# Patient Record
Sex: Male | Born: 1935 | Race: White | Hispanic: No | Marital: Married | Smoking: Never smoker
Health system: Southern US, Community
[De-identification: ages and names within clinical notes are randomized; demographics above are authoritative.]

## PROBLEM LIST (undated history)

## (undated) DIAGNOSIS — R001 Bradycardia, unspecified: Secondary | ICD-10-CM

## (undated) DIAGNOSIS — E785 Hyperlipidemia, unspecified: Secondary | ICD-10-CM

## (undated) DIAGNOSIS — I219 Acute myocardial infarction, unspecified: Secondary | ICD-10-CM

## (undated) DIAGNOSIS — I255 Ischemic cardiomyopathy: Secondary | ICD-10-CM

## (undated) DIAGNOSIS — I2510 Atherosclerotic heart disease of native coronary artery without angina pectoris: Secondary | ICD-10-CM

## (undated) DIAGNOSIS — R42 Dizziness and giddiness: Secondary | ICD-10-CM

## (undated) DIAGNOSIS — D696 Thrombocytopenia, unspecified: Secondary | ICD-10-CM

## (undated) DIAGNOSIS — N400 Enlarged prostate without lower urinary tract symptoms: Secondary | ICD-10-CM

## (undated) DIAGNOSIS — M1990 Unspecified osteoarthritis, unspecified site: Secondary | ICD-10-CM

## (undated) DIAGNOSIS — I340 Nonrheumatic mitral (valve) insufficiency: Secondary | ICD-10-CM

## (undated) DIAGNOSIS — I351 Nonrheumatic aortic (valve) insufficiency: Secondary | ICD-10-CM

## (undated) DIAGNOSIS — C4490 Unspecified malignant neoplasm of skin, unspecified: Secondary | ICD-10-CM

## (undated) DIAGNOSIS — IMO0001 Reserved for inherently not codable concepts without codable children: Secondary | ICD-10-CM

## (undated) DIAGNOSIS — I10 Essential (primary) hypertension: Secondary | ICD-10-CM

## (undated) HISTORY — PX: MOHS SURGERY: SUR867

## (undated) HISTORY — DX: Bradycardia, unspecified: R00.1

## (undated) HISTORY — DX: Unspecified osteoarthritis, unspecified site: M19.90

## (undated) HISTORY — PX: COLONOSCOPY: SHX174

## (undated) HISTORY — DX: Dizziness and giddiness: R42

## (undated) HISTORY — PX: JOINT REPLACEMENT: SHX530

## (undated) HISTORY — DX: Hyperlipidemia, unspecified: E78.5

## (undated) HISTORY — DX: Atherosclerotic heart disease of native coronary artery without angina pectoris: I25.10

## (undated) HISTORY — DX: Essential (primary) hypertension: I10

## (undated) HISTORY — DX: Ischemic cardiomyopathy: I25.5

## (undated) HISTORY — PX: TRANSURETHRAL RESECTION OF PROSTATE: SHX73

## (undated) HISTORY — DX: Thrombocytopenia, unspecified: D69.6

## (undated) HISTORY — DX: Benign prostatic hyperplasia without lower urinary tract symptoms: N40.0

## (undated) HISTORY — DX: Nonrheumatic mitral (valve) insufficiency: I34.0

## (undated) HISTORY — DX: Nonrheumatic aortic (valve) insufficiency: I35.1

## (undated) HISTORY — PX: CORONARY ANGIOPLASTY: SHX604

---

## 2000-02-02 DIAGNOSIS — I219 Acute myocardial infarction, unspecified: Secondary | ICD-10-CM

## 2000-02-02 HISTORY — DX: Acute myocardial infarction, unspecified: I21.9

## 2000-05-17 ENCOUNTER — Ambulatory Visit (HOSPITAL_COMMUNITY): Admission: RE | Admit: 2000-05-17 | Discharge: 2000-05-17 | Payer: Self-pay | Admitting: Gastroenterology

## 2001-01-07 ENCOUNTER — Inpatient Hospital Stay (HOSPITAL_COMMUNITY): Admission: EM | Admit: 2001-01-07 | Discharge: 2001-01-19 | Payer: Self-pay | Admitting: Emergency Medicine

## 2001-01-07 ENCOUNTER — Encounter: Payer: Self-pay | Admitting: *Deleted

## 2001-01-08 ENCOUNTER — Encounter: Payer: Self-pay | Admitting: *Deleted

## 2001-01-09 ENCOUNTER — Encounter: Payer: Self-pay | Admitting: *Deleted

## 2001-01-14 ENCOUNTER — Encounter: Payer: Self-pay | Admitting: Cardiology

## 2001-02-14 ENCOUNTER — Encounter (HOSPITAL_COMMUNITY): Admission: RE | Admit: 2001-02-14 | Discharge: 2001-05-15 | Payer: Self-pay | Admitting: Internal Medicine

## 2002-05-04 ENCOUNTER — Encounter: Payer: Self-pay | Admitting: Orthopedic Surgery

## 2002-05-04 ENCOUNTER — Encounter: Admission: RE | Admit: 2002-05-04 | Discharge: 2002-05-04 | Payer: Self-pay | Admitting: Orthopedic Surgery

## 2004-01-16 ENCOUNTER — Ambulatory Visit: Payer: Self-pay | Admitting: *Deleted

## 2004-02-02 HISTORY — PX: HIP ARTHROPLASTY: SHX981

## 2004-02-04 ENCOUNTER — Inpatient Hospital Stay (HOSPITAL_COMMUNITY): Admission: RE | Admit: 2004-02-04 | Discharge: 2004-02-09 | Payer: Self-pay | Admitting: Orthopedic Surgery

## 2004-05-18 ENCOUNTER — Ambulatory Visit: Payer: Self-pay | Admitting: *Deleted

## 2004-05-20 ENCOUNTER — Ambulatory Visit: Payer: Self-pay | Admitting: *Deleted

## 2004-11-19 ENCOUNTER — Ambulatory Visit: Payer: Self-pay | Admitting: Cardiology

## 2004-11-27 ENCOUNTER — Ambulatory Visit: Payer: Self-pay | Admitting: Internal Medicine

## 2004-12-02 ENCOUNTER — Ambulatory Visit: Payer: Self-pay | Admitting: Internal Medicine

## 2004-12-04 ENCOUNTER — Ambulatory Visit: Payer: Self-pay | Admitting: Cardiology

## 2005-03-31 ENCOUNTER — Inpatient Hospital Stay (HOSPITAL_COMMUNITY): Admission: RE | Admit: 2005-03-31 | Discharge: 2005-04-02 | Payer: Self-pay | Admitting: Urology

## 2005-03-31 ENCOUNTER — Encounter (INDEPENDENT_AMBULATORY_CARE_PROVIDER_SITE_OTHER): Payer: Self-pay | Admitting: Specialist

## 2005-09-22 ENCOUNTER — Ambulatory Visit: Payer: Self-pay | Admitting: Cardiovascular Disease

## 2005-09-22 ENCOUNTER — Inpatient Hospital Stay (HOSPITAL_COMMUNITY): Admission: EM | Admit: 2005-09-22 | Discharge: 2005-09-23 | Payer: Self-pay | Admitting: Emergency Medicine

## 2005-09-30 ENCOUNTER — Encounter (HOSPITAL_COMMUNITY): Admission: RE | Admit: 2005-09-30 | Discharge: 2005-12-29 | Payer: Self-pay | Admitting: Cardiology

## 2005-10-14 ENCOUNTER — Ambulatory Visit: Payer: Self-pay | Admitting: Cardiology

## 2005-11-12 ENCOUNTER — Ambulatory Visit: Payer: Self-pay | Admitting: Cardiology

## 2005-12-08 ENCOUNTER — Ambulatory Visit: Payer: Self-pay | Admitting: Internal Medicine

## 2005-12-15 ENCOUNTER — Ambulatory Visit: Payer: Self-pay

## 2005-12-22 ENCOUNTER — Ambulatory Visit: Payer: Self-pay | Admitting: Cardiology

## 2005-12-24 ENCOUNTER — Ambulatory Visit: Payer: Self-pay | Admitting: Internal Medicine

## 2005-12-30 ENCOUNTER — Encounter (HOSPITAL_COMMUNITY): Admission: RE | Admit: 2005-12-30 | Discharge: 2006-01-07 | Payer: Self-pay | Admitting: Cardiology

## 2006-05-20 ENCOUNTER — Ambulatory Visit: Payer: Self-pay | Admitting: Cardiology

## 2006-11-11 ENCOUNTER — Ambulatory Visit: Payer: Self-pay | Admitting: Cardiology

## 2006-12-06 ENCOUNTER — Ambulatory Visit: Payer: Self-pay

## 2007-04-14 ENCOUNTER — Ambulatory Visit: Payer: Self-pay | Admitting: Internal Medicine

## 2007-04-14 DIAGNOSIS — I2510 Atherosclerotic heart disease of native coronary artery without angina pectoris: Secondary | ICD-10-CM | POA: Insufficient documentation

## 2007-04-14 DIAGNOSIS — E785 Hyperlipidemia, unspecified: Secondary | ICD-10-CM | POA: Insufficient documentation

## 2007-04-14 DIAGNOSIS — N400 Enlarged prostate without lower urinary tract symptoms: Secondary | ICD-10-CM | POA: Insufficient documentation

## 2007-04-14 DIAGNOSIS — I10 Essential (primary) hypertension: Secondary | ICD-10-CM | POA: Insufficient documentation

## 2007-04-21 LAB — CONVERTED CEMR LAB
BUN: 14 mg/dL (ref 6–23)
Bilirubin Urine: NEGATIVE
CO2: 32 meq/L (ref 19–32)
Calcium: 9.5 mg/dL (ref 8.4–10.5)
Chloride: 108 meq/L (ref 96–112)
Cholesterol: 135 mg/dL (ref 0–200)
Creatinine, Ser: 1.1 mg/dL (ref 0.4–1.5)
GFR calc Af Amer: 83 mL/min
GFR calc non Af Amer: 68 mL/min
Glucose, Bld: 119 mg/dL — ABNORMAL HIGH (ref 70–99)
HDL: 65.5 mg/dL (ref >39.0)
Ketones, ur: NEGATIVE mg/dL
LDL Cholesterol: 58 mg/dL (ref 0–99)
Leukocytes, UA: NEGATIVE
Nitrite: NEGATIVE
PSA: 1.85 ng/mL (ref 0.10–4.00)
Potassium: 4.8 meq/L (ref 3.5–5.1)
Sodium: 143 meq/L (ref 135–145)
Specific Gravity, Urine: 1.015 (ref 1.000–1.03)
TSH: 2.68 microintl units/mL (ref 0.35–5.50)
Total CHOL/HDL Ratio: 2.1
Total Protein, Urine: NEGATIVE mg/dL
Triglycerides: 60 mg/dL (ref 0–149)
Urine Glucose: NEGATIVE mg/dL
Urobilinogen, UA: 0.2 (ref 0.0–1.0)
VLDL: 12 mg/dL (ref 0–40)
pH: 6.5 (ref 5.0–8.0)

## 2007-05-26 ENCOUNTER — Ambulatory Visit: Payer: Self-pay | Admitting: Cardiology

## 2007-07-31 ENCOUNTER — Ambulatory Visit: Payer: Self-pay | Admitting: Internal Medicine

## 2007-11-16 ENCOUNTER — Ambulatory Visit: Payer: Self-pay | Admitting: Cardiovascular Disease

## 2007-11-27 ENCOUNTER — Ambulatory Visit: Payer: Self-pay | Admitting: Internal Medicine

## 2007-11-27 LAB — CONVERTED CEMR LAB
ALT: 19 units/L (ref 0–53)
AST: 27 units/L (ref 0–37)
Albumin: 3.7 g/dL (ref 3.5–5.2)
Alkaline Phosphatase: 50 units/L (ref 39–117)
Basophils Absolute: 0 10*3/uL (ref 0.0–0.1)
Basophils Relative: 0.5 % (ref 0.0–3.0)
Bilirubin, Direct: 0.2 mg/dL (ref 0.0–0.3)
Cholesterol: 126 mg/dL (ref 0–200)
Eosinophils Absolute: 0.3 10*3/uL (ref 0.0–0.7)
Eosinophils Relative: 3.8 % (ref 0.0–5.0)
HCT: 44.1 % (ref 39.0–52.0)
HDL: 61.7 mg/dL (ref >39.0)
Hemoglobin: 15 g/dL (ref 13.0–17.0)
LDL Cholesterol: 54 mg/dL (ref 0–99)
Lymphocytes Relative: 21.1 % (ref 12.0–46.0)
MCHC: 34.1 g/dL (ref 30.0–36.0)
MCV: 94.1 fL (ref 78.0–100.0)
Monocytes Absolute: 0.7 10*3/uL (ref 0.1–1.0)
Monocytes Relative: 11 % (ref 3.0–12.0)
Neutro Abs: 4.4 10*3/uL (ref 1.4–7.7)
Neutrophils Relative %: 63.6 % (ref 43.0–77.0)
Platelets: 175 10*3/uL (ref 150–400)
RBC: 4.68 M/uL (ref 4.22–5.81)
RDW: 13.3 % (ref 11.5–14.6)
Total Bilirubin: 1.7 mg/dL — ABNORMAL HIGH (ref 0.3–1.2)
Total CHOL/HDL Ratio: 2
Total Protein: 7.1 g/dL (ref 6.0–8.3)
Triglycerides: 54 mg/dL (ref 0–149)
VLDL: 11 mg/dL (ref 0–40)
WBC: 6.8 10*3/uL (ref 4.5–10.5)

## 2007-12-04 ENCOUNTER — Ambulatory Visit: Payer: Self-pay | Admitting: Internal Medicine

## 2007-12-13 ENCOUNTER — Encounter: Payer: Self-pay | Admitting: Cardiovascular Disease

## 2007-12-13 ENCOUNTER — Ambulatory Visit: Payer: Self-pay

## 2008-03-07 ENCOUNTER — Telehealth: Payer: Self-pay | Admitting: Internal Medicine

## 2008-03-11 ENCOUNTER — Ambulatory Visit: Payer: Self-pay | Admitting: Internal Medicine

## 2008-03-11 DIAGNOSIS — R42 Dizziness and giddiness: Secondary | ICD-10-CM | POA: Insufficient documentation

## 2008-04-29 DIAGNOSIS — I2510 Atherosclerotic heart disease of native coronary artery without angina pectoris: Secondary | ICD-10-CM

## 2008-04-29 DIAGNOSIS — I509 Heart failure, unspecified: Secondary | ICD-10-CM | POA: Insufficient documentation

## 2008-04-29 DIAGNOSIS — M1990 Unspecified osteoarthritis, unspecified site: Secondary | ICD-10-CM | POA: Insufficient documentation

## 2008-04-29 DIAGNOSIS — D696 Thrombocytopenia, unspecified: Secondary | ICD-10-CM | POA: Insufficient documentation

## 2008-04-29 DIAGNOSIS — I5042 Chronic combined systolic (congestive) and diastolic (congestive) heart failure: Secondary | ICD-10-CM | POA: Insufficient documentation

## 2008-05-06 ENCOUNTER — Ambulatory Visit: Payer: Self-pay | Admitting: Cardiovascular Disease

## 2008-05-06 ENCOUNTER — Encounter: Payer: Self-pay | Admitting: Cardiovascular Disease

## 2008-05-06 DIAGNOSIS — I6529 Occlusion and stenosis of unspecified carotid artery: Secondary | ICD-10-CM | POA: Insufficient documentation

## 2008-06-12 ENCOUNTER — Ambulatory Visit: Payer: Self-pay | Admitting: Internal Medicine

## 2008-06-18 ENCOUNTER — Ambulatory Visit: Payer: Self-pay | Admitting: Internal Medicine

## 2008-06-19 LAB — CONVERTED CEMR LAB
ALT: 19 units/L (ref 0–53)
AST: 28 units/L (ref 0–37)
Albumin: 3.7 g/dL (ref 3.5–5.2)
Alkaline Phosphatase: 55 units/L (ref 39–117)
BUN: 12 mg/dL (ref 6–23)
Basophils Absolute: 0 10*3/uL (ref 0.0–0.1)
Basophils Relative: 0.4 % (ref 0.0–3.0)
Bilirubin Urine: NEGATIVE
Bilirubin, Direct: 0.2 mg/dL (ref 0.0–0.3)
CO2: 30 meq/L (ref 19–32)
Calcium: 9.1 mg/dL (ref 8.4–10.5)
Chloride: 108 meq/L (ref 96–112)
Cholesterol: 125 mg/dL (ref 0–200)
Creatinine, Ser: 1 mg/dL (ref 0.4–1.5)
Eosinophils Absolute: 0.2 10*3/uL (ref 0.0–0.7)
Eosinophils Relative: 3.6 % (ref 0.0–5.0)
GFR calc non Af Amer: 75.88 mL/min (ref >60)
Glucose, Bld: 106 mg/dL — ABNORMAL HIGH (ref 70–99)
HCT: 42.8 % (ref 39.0–52.0)
HDL: 61.1 mg/dL (ref >39.00)
Hemoglobin: 14.7 g/dL (ref 13.0–17.0)
Ketones, ur: NEGATIVE mg/dL
LDL Cholesterol: 53 mg/dL (ref 0–99)
Leukocytes, UA: NEGATIVE
Lymphocytes Relative: 22.3 % (ref 12.0–46.0)
Lymphs Abs: 1.4 10*3/uL (ref 0.7–4.0)
MCHC: 34.3 g/dL (ref 30.0–36.0)
MCV: 94.1 fL (ref 78.0–100.0)
Monocytes Absolute: 0.7 10*3/uL (ref 0.1–1.0)
Monocytes Relative: 11.5 % (ref 3.0–12.0)
Neutro Abs: 4 10*3/uL (ref 1.4–7.7)
Neutrophils Relative %: 62.2 % (ref 43.0–77.0)
Nitrite: NEGATIVE
Platelets: 177 10*3/uL (ref 150.0–400.0)
Potassium: 4.8 meq/L (ref 3.5–5.1)
RBC: 4.55 M/uL (ref 4.22–5.81)
RDW: 13 % (ref 11.5–14.6)
Sodium: 142 meq/L (ref 135–145)
Specific Gravity, Urine: 1.01 (ref 1.000–1.030)
TSH: 3.36 microintl units/mL (ref 0.35–5.50)
Total Bilirubin: 1.2 mg/dL (ref 0.3–1.2)
Total CHOL/HDL Ratio: 2
Total Protein, Urine: NEGATIVE mg/dL
Total Protein: 7.1 g/dL (ref 6.0–8.3)
Triglycerides: 53 mg/dL (ref 0.0–149.0)
Urine Glucose: NEGATIVE mg/dL
Urobilinogen, UA: 0.2 (ref 0.0–1.0)
VLDL: 10.6 mg/dL (ref 0.0–40.0)
WBC: 6.3 10*3/uL (ref 4.5–10.5)
pH: 7 (ref 5.0–8.0)

## 2008-11-12 ENCOUNTER — Ambulatory Visit: Payer: Self-pay | Admitting: Cardiovascular Disease

## 2008-11-22 ENCOUNTER — Ambulatory Visit: Payer: Self-pay

## 2008-11-22 ENCOUNTER — Encounter: Payer: Self-pay | Admitting: Cardiovascular Disease

## 2008-11-25 ENCOUNTER — Telehealth (INDEPENDENT_AMBULATORY_CARE_PROVIDER_SITE_OTHER): Payer: Self-pay | Admitting: *Deleted

## 2008-12-11 ENCOUNTER — Ambulatory Visit: Payer: Self-pay | Admitting: Internal Medicine

## 2008-12-11 DIAGNOSIS — D485 Neoplasm of uncertain behavior of skin: Secondary | ICD-10-CM | POA: Insufficient documentation

## 2008-12-18 ENCOUNTER — Ambulatory Visit: Payer: Self-pay | Admitting: Internal Medicine

## 2009-01-02 ENCOUNTER — Telehealth: Payer: Self-pay | Admitting: Internal Medicine

## 2009-01-02 DIAGNOSIS — C434 Malignant melanoma of scalp and neck: Secondary | ICD-10-CM | POA: Insufficient documentation

## 2009-01-09 ENCOUNTER — Encounter: Payer: Self-pay | Admitting: Internal Medicine

## 2009-04-15 ENCOUNTER — Ambulatory Visit: Payer: Self-pay | Admitting: Internal Medicine

## 2009-04-15 LAB — CONVERTED CEMR LAB
ALT: 22 units/L (ref 0–53)
AST: 28 units/L (ref 0–37)
Albumin: 4.1 g/dL (ref 3.5–5.2)
Alkaline Phosphatase: 53 units/L (ref 39–117)
BUN: 16 mg/dL (ref 6–23)
Basophils Absolute: 0.1 10*3/uL (ref 0.0–0.1)
Basophils Relative: 0.9 % (ref 0.0–3.0)
Bilirubin Urine: NEGATIVE
Bilirubin, Direct: 0.3 mg/dL (ref 0.0–0.3)
CO2: 30 meq/L (ref 19–32)
Calcium: 9.4 mg/dL (ref 8.4–10.5)
Chloride: 105 meq/L (ref 96–112)
Cholesterol: 133 mg/dL (ref 0–200)
Creatinine, Ser: 1.1 mg/dL (ref 0.4–1.5)
Eosinophils Absolute: 0.2 10*3/uL (ref 0.0–0.7)
Eosinophils Relative: 3.5 % (ref 0.0–5.0)
GFR calc non Af Amer: 67.84 mL/min (ref >60)
Glucose, Bld: 94 mg/dL (ref 70–99)
HCT: 44.7 % (ref 39.0–52.0)
HDL: 74.6 mg/dL (ref >39.00)
Hemoglobin: 14.9 g/dL (ref 13.0–17.0)
Ketones, ur: NEGATIVE mg/dL
LDL Cholesterol: 47 mg/dL (ref 0–99)
Leukocytes, UA: NEGATIVE
Lymphocytes Relative: 27.9 % (ref 12.0–46.0)
Lymphs Abs: 2 10*3/uL (ref 0.7–4.0)
MCHC: 33.3 g/dL (ref 30.0–36.0)
MCV: 93 fL (ref 78.0–100.0)
Monocytes Absolute: 0.8 10*3/uL (ref 0.1–1.0)
Monocytes Relative: 12 % (ref 3.0–12.0)
Neutro Abs: 3.9 10*3/uL (ref 1.4–7.7)
Neutrophils Relative %: 55.7 % (ref 43.0–77.0)
Nitrite: NEGATIVE
PSA: 3.22 ng/mL (ref 0.10–4.00)
Platelets: 174 10*3/uL (ref 150.0–400.0)
Potassium: 4.6 meq/L (ref 3.5–5.1)
RBC: 4.8 M/uL (ref 4.22–5.81)
RDW: 13.7 % (ref 11.5–14.6)
Sodium: 139 meq/L (ref 135–145)
Specific Gravity, Urine: 1.015 (ref 1.000–1.030)
TSH: 4.6 microintl units/mL (ref 0.35–5.50)
Total Bilirubin: 1.5 mg/dL — ABNORMAL HIGH (ref 0.3–1.2)
Total CHOL/HDL Ratio: 2
Total Protein, Urine: NEGATIVE mg/dL
Total Protein: 7.5 g/dL (ref 6.0–8.3)
Triglycerides: 55 mg/dL (ref 0.0–149.0)
Urine Glucose: NEGATIVE mg/dL
Urobilinogen, UA: 0.2 (ref 0.0–1.0)
VLDL: 11 mg/dL (ref 0.0–40.0)
WBC: 7 10*3/uL (ref 4.5–10.5)
pH: 7 (ref 5.0–8.0)

## 2009-04-22 ENCOUNTER — Ambulatory Visit: Payer: Self-pay | Admitting: Internal Medicine

## 2009-04-22 DIAGNOSIS — R972 Elevated prostate specific antigen [PSA]: Secondary | ICD-10-CM | POA: Insufficient documentation

## 2009-05-12 ENCOUNTER — Ambulatory Visit: Payer: Self-pay | Admitting: Cardiovascular Disease

## 2009-05-12 DIAGNOSIS — N529 Male erectile dysfunction, unspecified: Secondary | ICD-10-CM | POA: Insufficient documentation

## 2009-08-29 ENCOUNTER — Ambulatory Visit: Payer: Self-pay | Admitting: Internal Medicine

## 2009-10-15 ENCOUNTER — Ambulatory Visit: Payer: Self-pay | Admitting: Internal Medicine

## 2009-10-16 LAB — CONVERTED CEMR LAB
ALT: 18 units/L (ref 0–53)
AST: 28 units/L (ref 0–37)
Albumin: 4 g/dL (ref 3.5–5.2)
Alkaline Phosphatase: 46 units/L (ref 39–117)
BUN: 20 mg/dL (ref 6–23)
Bilirubin, Direct: 0.3 mg/dL (ref 0.0–0.3)
CO2: 30 meq/L (ref 19–32)
Calcium: 9.4 mg/dL (ref 8.4–10.5)
Chloride: 106 meq/L (ref 96–112)
Cholesterol: 127 mg/dL (ref 0–200)
Creatinine, Ser: 1.2 mg/dL (ref 0.4–1.5)
GFR calc non Af Amer: 63.73 mL/min (ref >60)
Glucose, Bld: 105 mg/dL — ABNORMAL HIGH (ref 70–99)
HDL: 61 mg/dL (ref >39.00)
LDL Cholesterol: 57 mg/dL (ref 0–99)
PSA: 3.51 ng/mL (ref 0.10–4.00)
Potassium: 6 meq/L — ABNORMAL HIGH (ref 3.5–5.1)
Sodium: 143 meq/L (ref 135–145)
Total Bilirubin: 1.6 mg/dL — ABNORMAL HIGH (ref 0.3–1.2)
Total CHOL/HDL Ratio: 2
Total Protein: 6.8 g/dL (ref 6.0–8.3)
Triglycerides: 47 mg/dL (ref 0.0–149.0)
VLDL: 9.4 mg/dL (ref 0.0–40.0)

## 2009-10-21 ENCOUNTER — Ambulatory Visit: Payer: Self-pay | Admitting: Internal Medicine

## 2009-10-21 DIAGNOSIS — E875 Hyperkalemia: Secondary | ICD-10-CM | POA: Insufficient documentation

## 2009-10-21 DIAGNOSIS — L570 Actinic keratosis: Secondary | ICD-10-CM | POA: Insufficient documentation

## 2009-10-22 LAB — CONVERTED CEMR LAB
BUN: 14 mg/dL (ref 6–23)
CO2: 23 meq/L (ref 19–32)
Calcium: 9.2 mg/dL (ref 8.4–10.5)
Chloride: 106 meq/L (ref 96–112)
Creatinine, Ser: 1 mg/dL (ref 0.4–1.5)
GFR calc non Af Amer: 75.64 mL/min (ref >60)
Glucose, Bld: 135 mg/dL — ABNORMAL HIGH (ref 70–99)
Potassium: 4.9 meq/L (ref 3.5–5.1)
Sodium: 138 meq/L (ref 135–145)

## 2009-11-18 ENCOUNTER — Encounter: Payer: Self-pay | Admitting: Cardiovascular Disease

## 2009-11-19 ENCOUNTER — Ambulatory Visit: Payer: Self-pay

## 2009-11-19 ENCOUNTER — Ambulatory Visit: Payer: Self-pay | Admitting: Cardiovascular Disease

## 2010-01-09 ENCOUNTER — Telehealth: Payer: Self-pay | Admitting: Cardiovascular Disease

## 2010-01-23 ENCOUNTER — Telehealth: Payer: Self-pay | Admitting: Cardiovascular Disease

## 2010-02-17 ENCOUNTER — Other Ambulatory Visit: Payer: Self-pay | Admitting: Internal Medicine

## 2010-02-17 ENCOUNTER — Ambulatory Visit
Admission: RE | Admit: 2010-02-17 | Discharge: 2010-02-17 | Payer: Self-pay | Source: Home / Self Care | Attending: Internal Medicine | Admitting: Internal Medicine

## 2010-02-17 LAB — BASIC METABOLIC PANEL
BUN: 18 mg/dL (ref 6–23)
CO2: 29 mEq/L (ref 19–32)
Calcium: 9 mg/dL (ref 8.4–10.5)
Chloride: 101 mEq/L (ref 96–112)
Creatinine, Ser: 1.1 mg/dL (ref 0.4–1.5)
GFR: 66.31 mL/min (ref >60.00)
Glucose, Bld: 153 mg/dL — ABNORMAL HIGH (ref 70–99)
Potassium: 4.5 mEq/L (ref 3.5–5.1)
Sodium: 136 mEq/L (ref 135–145)

## 2010-02-23 ENCOUNTER — Ambulatory Visit
Admission: RE | Admit: 2010-02-23 | Discharge: 2010-02-23 | Payer: Self-pay | Source: Home / Self Care | Attending: Internal Medicine | Admitting: Internal Medicine

## 2010-02-23 DIAGNOSIS — R7309 Other abnormal glucose: Secondary | ICD-10-CM | POA: Insufficient documentation

## 2010-03-03 NOTE — Assessment & Plan Note (Signed)
Summary: 6 mo rov /nws  $50   Vital Signs:  Patient profile:   77 year old male Weight:      170 pounds Temp:     97.3 degrees F oral Pulse rate:   46 / minute BP sitting:   114 / 64  (left arm)  Vitals Entered By: Tora Perches (December 11, 2008 11:09 AM) CC: f/u Is Patient Diabetic? No   Primary Care Provider:  Tresa Garter MD  CC:  f/u.  History of Present Illness: The patient presents for a follow up of hypertension, CAD, hyperlipidemia    Preventive Screening-Counseling & Management  Alcohol-Tobacco     Smoking Status: never  Current Medications (verified): 1)  Lisinopril 20 Mg Tabs (Lisinopril) .[name].. 1 Tablet By Mouth Bid 2)  Plavix 75 Mg Tabs (Clopidogrel Bisulfate) .... Take 1 Tab By Mouth Daily 3)  Carvedilol 6.25 Mg  Tabs (Carvedilol) .[name].. 1 Tab By Mouth Two Times Daily 4)  Simvastatin 80 Mg Tabs (Simvastatin) .[name].. 1 By Mouth At Bedtime 5)  Aspirin 81 Mg  Tbec (Aspirin) .... One By Mouth Every Day 6)  Vitamin D3 1000 Unit  Tabs (Cholecalciferol) .[name].. 1 By Mouth Daily 7)  B Complex   Tabs (B Complex Vitamins) .[name].. 1 By Mouth Qd 8)  Meclizine Hcl 12.5 Mg Tabs (Meclizine Hcl) .[name].. 1-2 Tab Qid  As Needed 9)  Nitroglycerin 0.4 Mg Subl (Nitroglycerin) .... One Tablet Under Tongue Every 5 Minutes As Needed For Chest Pain---May Repeat Times Three 10)  Glucosamine Triple Strength .... Take 1 Tablet By Mouth Two Times A Day  Allergies: 1)  ! Pcn  Past History:  Past Medical History: Last updated: 06/12/2008 CORONARY ARTERY DISEASE (ICD-414.00) HYPERTENSION (ICD-401.9) HYPERLIPIDEMIA (ICD-272.4) BENIGN PROSTATIC HYPERTROPHY (ICD-600.00) DR Logan Bores THROMBOCYTOPENIA (ICD-287.5) DEGENERATIVE JOINT DISEASE (ICD-715.90) VERTIGO (ICD-780.4)  BPV 2010 Poss. [facility]  Family History: Last updated: 04/29/2008 Family History Hypertension Mother: died at 82 MI had known CAD Father: died MVA at 61 Siblings: CAD  Social History: Last updated: 04/29/2008  Retired Married Regular exercise-yes Tobacco Use - No.  Alcohol Use - no Drug Use - no  Review of Systems  The patient denies fever, chest pain, dyspnea on exertion, abdominal pain, melena, and severe indigestion/heartburn.    Physical Exam  General:  General: Well developed, well nourished, NAD HEENT: OP clear, mucus membranes moist SKIN: warm, dry Psychiatric: Mood and affect normal Neck: No JVD, no carotid bruits, no thyromegaly, no lymphadenopathy. Lungs:Clear bilaterally, no wheezes, rhonci, crackles CV: RRR no murmurs, gallops rubs Abdomen: soft, NT, ND, BS present Extremities: No edema, pulses 1+ bilateral PT/DP.  Nose:  External nasal examination shows no deformity or inflammation. Nasal mucosa are pink and moist without lesions or exudates. Mouth:  Oral mucosa and oropharynx without lesions or exudates.  Teeth in good repair. Neck:  No deformities, masses, or tenderness noted. Lungs:  Normal respiratory effort, chest expands symmetrically. Lungs are clear to auscultation, no crackles or wheezes. Heart:  RRR Abdomen:  Bowel sounds positive,abdomen soft and non-tender without masses, organomegaly or hernias noted. Msk:  No deformity or scoliosis noted of thoracic or lumbar spine.   Neurologic:  No cranial nerve deficits noted. Station and gait are normal. Plantar reflexes are down-going bilaterally. DTRs are symmetrical throughout. Sensory, motor and coordinative functions appear intact. Skin:  Aging changes Psych:  Cognition and judgment appear intact. Alert and cooperative with normal attention span and concentration. No apparent delusions, illusions, hallucinations   Impression & Recommendations:  Problem # 1:  CORONARY ARTERY DISEASE (ICD-414.00) Assessment Unchanged  His updated medication list for this problem includes:    Lisinopril 20 Mg Tabs (Lisinopril) .[name]... 1 tablet by mouth bid    Plavix 75 Mg Tabs (Clopidogrel bisulfate) .[name]... Take 1 tab by mouth daily     Carvedilol 6.25 Mg Tabs (Carvedilol) .[name]... 1 tab by mouth two times daily    Aspirin 81 Mg Tbec (Aspirin) ..... One by mouth every day    Nitroglycerin 0.4 Mg Subl (Nitroglycerin) ..... One tablet under tongue every 5 minutes as needed for chest pain---may repeat times three  Problem # 2:  HYPERTENSION (ICD-401.9) Assessment: Unchanged  His updated medication list for this problem includes:    Lisinopril 20 Mg Tabs (Lisinopril) .[name]... 1 tablet by mouth bid    Carvedilol 6.25 Mg Tabs (Carvedilol) .[name]... 1 tab by mouth two times daily  Problem # 3:  HYPERLIPIDEMIA (ICD-272.4) Assessment: Unchanged  His updated medication list for this problem includes:    Simvastatin 80 Mg Tabs (Simvastatin) .[name]... 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 1 tablet by mouth bid 2)  Plavix 75 Mg Tabs (Clopidogrel bisulfate) .... Take 1 tab by mouth daily 3)  Carvedilol 6.25 Mg Tabs (Carvedilol) .[name].. 1 tab by mouth two times daily 4)  Simvastatin 80 Mg Tabs (Simvastatin) .[name].. 1 by mouth at bedtime 5)  Aspirin 81 Mg Tbec (Aspirin) .... One by mouth every day 6)  Vitamin D3 1000 Unit Tabs (Cholecalciferol) .[name].. 1 by mouth daily 7)  B Complex Tabs (B complex vitamins) .[name].. 1 by mouth qd 8)  Meclizine Hcl 12.5 Mg Tabs (Meclizine hcl) .[name].. 1-2 tab qid  as needed 9)  Nitroglycerin 0.4 Mg Subl (Nitroglycerin) .... One tablet under tongue every 5 minutes as needed for chest pain---may repeat times three 10)  Glucosamine Triple Strength  .... Take 1 tablet by mouth two times a day  Patient Instructions: 1)  Skin bx w/me 2)  Please schedule a follow-up appointment in 4 months. 3)  BMP prior to visit, ICD-9: 4)  Hepatic Panel prior to visit, ICD-9:596.0 414.8  5)  Lipid Panel prior to visit, ICD-9: 6)  TSH prior to visit, ICD-9: 7)  CBC w/ Diff prior to visit, ICD-9: 8)  Urine-dip prior to visit, ICD-9: 9)  PSA prior to visit, ICD-9:

## 2010-03-03 NOTE — Assessment & Plan Note (Signed)
Summary: 6 month rov/sl    Visit Type:  6 mo f/u Primary Provider:  Tresa Garter MD  CC:  no cardiac complaints today.  History of Present Illness: 77 yo WM with h/o CAD s/p posterior inferior MI 2002 with placement of a bare metal stent in the circumflex artery and subsequent unstable angina August 2007 with placement of drug eluting stent in the mid LAD , also history of HTN, hyperlipidemia who returns today for routine cardiac folow up. He has been doing well. He continues to exercise on a daily basis. He has had no chest pain, shortness of breath, dizziness, palpitations, near syncope, syncope, orthopnea or lower ext edema. He has been taking all of his medications as prescribed.   He has been under alot of stress with his wife who is now confined to a wheelchair. She is disabled by back pain post back surgery.   Most recent lipids in September 2011 with TC=127, HDL 61, LDL 57.   LFTs ok.   Current Medications (verified): 1)  Lisinopril 20 Mg Tabs (Lisinopril) .... 1/2  Tablet By Mouth Two Times A Day 2)  Plavix 75 Mg Tabs (Clopidogrel Bisulfate) .... Take 1 Tab By Mouth Daily 3)  Carvedilol 6.25 Mg  Tabs (Carvedilol) .[name].. 1 Tab By Mouth Two Times Daily 4)  Simvastatin 80 Mg Tabs (Simvastatin) .[name].. 1 By Mouth At Bedtime 5)  Aspirin 81 Mg  Tbec (Aspirin) .... One By Mouth Every Day 6)  Vitamin D3 1000 Unit  Tabs (Cholecalciferol) .[name].. 1 By Mouth Daily 7)  B Complex   Tabs (B Complex Vitamins) .[name].. 1 By Mouth Qd 8)  Nitroglycerin 0.4 Mg Subl (Nitroglycerin) .... One Tablet Under Tongue Every 5 Minutes As Needed For Chest Pain---May Repeat Times Three 9)  Glucosamine Maximum Strength 1500 Mg Tabs (Glucosamine Hcl) .[name].. 1 Tab Two Times A Day  Allergies: 1)  ! Pcn  Past History:  Past Medical History: Reviewed history from 06/12/2008 and no changes required. CORONARY ARTERY DISEASE (ICD-414.00) HYPERTENSION (ICD-401.9) HYPERLIPIDEMIA (ICD-272.4) BENIGN PROSTATIC HYPERTROPHY  (ICD-600.00) DR Logan Bores THROMBOCYTOPENIA (ICD-287.5) DEGENERATIVE JOINT DISEASE (ICD-715.90) VERTIGO (ICD-780.4)  BPV 2010 Poss. [facility]  Social History: Reviewed history from 04/29/2008 and no changes required. Retired Married Regular exercise-yes Tobacco Use - No.  Alcohol Use - no Drug Use - no  Review of Systems  The patient denies fatigue, malaise, fever, weight gain/loss, vision loss, decreased hearing, hoarseness, chest pain, palpitations, shortness of breath, prolonged cough, wheezing, sleep apnea, coughing up blood, abdominal pain, blood in stool, nausea, vomiting, diarrhea, heartburn, incontinence, blood in urine, muscle weakness, joint pain, leg swelling, rash, skin lesions, headache, fainting, dizziness, depression, anxiety, enlarged lymph nodes, easy bruising or bleeding, and environmental allergies.    Vital Signs:  Patient profile:   77 year old male Height:      67 inches Weight:      162.8 pounds BMI:     25.59 Pulse rate:   62 / minute Pulse rhythm:   regular BP sitting:   132 / 72  (left arm) Cuff size:   large  Vitals Entered By: Danielle Rankin, CMA (November 19, 2009 2:14 PM)  Physical Exam  General:  General: Well developed, well nourished, NAD HEENT: OP clear, mucus membranes moist Musculoskeletal: Muscle strength 5/5 all ext Psychiatric: Mood and affect normal Neck: No JVD, no carotid bruits, no thyromegaly, no lymphadenopathy. Lungs:Clear bilaterally, no wheezes, rhonci, crackles CV: RRR no murmurs, gallops rubs Abdomen: soft, NT, ND, BS present Extremities:  No edema, pulses 2+.    Carotid Doppler  Procedure date:  11/19/2009  Findings:      Stable mild disease bilaterally, 40-59% RICA, 0-39% LICA stenosis.   Impression & Recommendations:  Problem # 1:  CORONARY ARTERY DISEASE (ICD-414.00) Stable. No chest pain or symptoms worrisome for angina. Continue current meds.   His updated medication list for this problem includes:    Lisinopril  20 Mg Tabs (Lisinopril) .[name]... 1/2  tablet by mouth two times a day    Plavix 75 Mg Tabs (Clopidogrel bisulfate) .[name]... Take 1 tab by mouth daily    Carvedilol 6.25 Mg Tabs (Carvedilol) .[name]... 1 tab by mouth two times daily    Aspirin 81 Mg Tbec (Aspirin) ..... One by mouth every day    Nitroglycerin 0.4 Mg Subl (Nitroglycerin) ..... One tablet under tongue every 5 minutes as needed for chest pain---may repeat times three  Problem # 2:  CAROTID ARTERY STENOSIS, WITHOUT INFARCTION (ICD-433.10) Stable. Repeat dopplers one year.   His updated medication list for this problem includes:    Plavix 75 Mg Tabs (Clopidogrel bisulfate) .[name]... Take 1 tab by mouth daily    Aspirin 81 Mg Tbec (Aspirin) ..... One by mouth every day  Problem # 3:  HYPERTENSION (ICD-401.9) Stable. No changes.   His updated medication list for this problem includes:    Lisinopril 20 Mg Tabs (Lisinopril) .[name]... 1/2  tablet by mouth two times a day    Carvedilol 6.25 Mg Tabs (Carvedilol) .[name]... 1 tab by mouth two times daily    Aspirin 81 Mg Tbec (Aspirin) ..... One by mouth every day  Patient Instructions: 1)  Your physician recommends that you schedule a follow-up appointment in: 6 months 2)  Your physician recommends that you continue on your current medications as directed. Please refer to the Current Medication list given to you today. 3)  Your physician has requested that you have a carotid duplex in 1 year.This test is an ultrasound of the carotid arteries in your neck. It looks at blood flow through these arteries that supply the brain with blood. Allow one hour for this exam. There are no restrictions or special instructions.

## 2010-03-03 NOTE — Assessment & Plan Note (Signed)
Summary: 4 mos f/u #/cd   Vital Signs:  Patient profile:   77 year old male Weight:      166 pounds Temp:     98.1 degrees F oral Pulse rate:   57 / minute BP sitting:   136 / 64  (left arm)  Vitals Entered By: Tora Perches (April 22, 2009 10:17 AM) CC: f/u Is Patient Diabetic? No   Primary Care Provider:  Tresa Garter MD  CC:  f/u.  History of Present Illness: The patient presents for a wellness examination   Preventive Screening-Counseling & Management  Alcohol-Tobacco     Smoking Status: never  Current Medications (verified): 1)  Lisinopril 20 Mg Tabs (Lisinopril) .[name].. 1 Tablet By Mouth Bid 2)  Plavix 75 Mg Tabs (Clopidogrel Bisulfate) .... Take 1 Tab By Mouth Daily 3)  Carvedilol 6.25 Mg  Tabs (Carvedilol) .[name].. 1 Tab By Mouth Two Times Daily 4)  Simvastatin 80 Mg Tabs (Simvastatin) .[name].. 1 By Mouth At Bedtime 5)  Aspirin 81 Mg  Tbec (Aspirin) .... One By Mouth Every Day 6)  Vitamin D3 1000 Unit  Tabs (Cholecalciferol) .[name].. 1 By Mouth Daily 7)  B Complex   Tabs (B Complex Vitamins) .[name].. 1 By Mouth Qd 8)  Meclizine Hcl 12.5 Mg Tabs (Meclizine Hcl) .[name].. 1-2 Tab Qid  As Needed 9)  Nitroglycerin 0.4 Mg Subl (Nitroglycerin) .... One Tablet Under Tongue Every 5 Minutes As Needed For Chest Pain---May Repeat Times Three 10)  Glucosamine Triple Strength .... Take 1 Tablet By Mouth Two Times A Day  Allergies: 1)  ! Pcn  Past History:  Past Medical History: Last updated: 06/12/2008 CORONARY ARTERY DISEASE (ICD-414.00) HYPERTENSION (ICD-401.9) HYPERLIPIDEMIA (ICD-272.4) BENIGN PROSTATIC HYPERTROPHY (ICD-600.00) DR Logan Bores THROMBOCYTOPENIA (ICD-287.5) DEGENERATIVE JOINT DISEASE (ICD-715.90) VERTIGO (ICD-780.4)  BPV 2010 Poss. [facility]  Past Surgical History: Last updated: 04/29/2008 Colooscopy x2 Hip Arthroplasty-Total TURP 2007  Family History: Last updated: 04/29/2008 Family History Hypertension Mother: died at 62 MI had known CAD Father: died MVA at  2 Siblings: CAD  Social History: Last updated: 04/29/2008 Retired Married Regular exercise-yes Tobacco Use - No.  Alcohol Use - no Drug Use - no  Review of Systems  The patient denies anorexia, fever, weight loss, weight gain, vision loss, decreased hearing, hoarseness, chest pain, syncope, dyspnea on exertion, peripheral edema, prolonged cough, headaches, hemoptysis, abdominal pain, melena, hematochezia, severe indigestion/heartburn, hematuria, incontinence, genital sores, muscle weakness, suspicious skin lesions, transient blindness, difficulty walking, depression, unusual weight change, abnormal bleeding, enlarged lymph nodes, angioedema, and testicular masses.         gets up 3 times at night to urinate  Physical Exam  General:  General: Well developed, well nourished, NAD HEENT: OP clear, mucus membranes moist SKIN: warm, dry Psychiatric: Mood and affect normal Neck: No JVD, no carotid bruits, no thyromegaly, no lymphadenopathy. Lungs:Clear bilaterally, no wheezes, rhonci, crackles CV: RRR no murmurs, gallops rubs Abdomen: soft, NT, ND, BS present Extremities: No edema, pulses 1+ bilateral PT/DP.  Skin:  slight erosion on L nostril ulcer on scalp is healing Psych:  Cognition and judgment appear intact. Alert and cooperative with normal attention span and concentration. No apparent delusions, illusions, hallucinationsnot anxious appearing and not depressed appearing.     Impression & Recommendations:  Problem # 1:  PHYSICAL EXAMINATION (ICD-V70.0) Assessment New The labs were reviewed with the patient.  Health and age related issues were discussed. Available screening tests and vaccinations were discussed as well. Healthy life style including good diet and  execise was discussed.  No depression.  The patient denies that she feels like life is not worth living, denies that he wishes that she were dead, and denies that she has thought about ending his life.  Patient hearing  function is screeded today; Height and weight recorded in vitals above.  Patient's vision is screened and appears to be fine.  ADLs are reviewed and addressed as needed.  Functional ability and level of safety have been reviewed and are appropriate.  ECG from before reviewed..  Patient has been counseled on age-appropriate routine health concerns for screening and prevention.  These are reviewed and up-to-date.  Immunizations are up-to-date or declined.  Labs ordered as indicated and will be reviewed.  Education, counseling, and referrals are performed based on age appropriate health issues.    Problem # 2:  PSA, INCREASED (ICD-790.93) mild Assessment: New Recheck in 6 mo  Problem # 3:  CORONARY ARTERY DISEASE (ICD-414.00) Assessment: Unchanged  His updated medication list for this problem includes:    Lisinopril 20 Mg Tabs (Lisinopril) .[name]... 1 tablet by mouth bid    Plavix 75 Mg Tabs (Clopidogrel bisulfate) .[name]... Take 1 tab by mouth daily    Carvedilol 6.25 Mg Tabs (Carvedilol) .[name]... 1 tab by mouth two times daily    Aspirin 81 Mg Tbec (Aspirin) ..... One by mouth every day    Nitroglycerin 0.4 Mg Subl (Nitroglycerin) ..... 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 1 tablet by mouth bid    Plavix 75 Mg Tabs (Clopidogrel bisulfate) .[name]... Take 1 tab by mouth daily    Carvedilol 6.25 Mg Tabs (Carvedilol) .[name]... 1 tab by mouth two times daily    Aspirin 81 Mg Tbec (Aspirin) ..... One by mouth every day  Complete Medication List: 1)  Lisinopril 20 Mg Tabs (Lisinopril) .[name].. 1 tablet by mouth bid 2)  Plavix 75 Mg Tabs (Clopidogrel bisulfate) .... Take 1 tab by mouth  daily 3)  Carvedilol 6.25 Mg Tabs (Carvedilol) .[name].. 1 tab by mouth two times daily 4)  Simvastatin 80 Mg Tabs (Simvastatin) .[name].. 1 by mouth at bedtime 5)  Aspirin 81 Mg Tbec (Aspirin) .... One by mouth every day 6)  Vitamin D3 1000 Unit Tabs (Cholecalciferol) .[name].. 1 by mouth daily 7)  B Complex Tabs (B complex vitamins) .[name].. 1 by mouth qd 8)  Meclizine Hcl 12.5 Mg Tabs (Meclizine hcl) .[name].. 1-2 tab qid  as needed 9)  Nitroglycerin 0.4 Mg Subl (Nitroglycerin) .... One tablet under tongue every 5 minutes as needed for chest pain---may repeat times three 10)  Glucosamine Triple Strength  .... Take 1 tablet by mouth two times a day  Other Orders: Tdap => 63yrs IM ([address]109) Admin 1st Vaccine (60454) Admin 1st Vaccine [facility]) [phone])  Patient Instructions: 1)  Please schedule a follow-up appointment in 6 months. 2)  BMP prior to visit, ICD-9: 3)  Hepatic Panel prior to visit, ICD-9: 4)  Lipid Panel prior to visit, ICD-9:596.0  414.8 5)  PSA prior to visit, ICD-9:   Tetanus/Td Vaccine    Vaccine Type: Tdap    Site: right deltoid    Mfr: [employer]oSmith[employer]    Dose: 0.5 ml    Route: IM  Given by: Tora Perches    Exp. Date: 04/26/2011    Lot #: [id number]    VIS given: 12/20/06 version given April 22, 2009.

## 2010-03-03 NOTE — Progress Notes (Signed)
Summary: rx Lisinopril called in today   Phone Note Refill Request Message from:  Patient on January 09, 2010 9:28 AM  Refills Requested: Medication #1:  LISINOPRIL 20 MG TABS 1/2  tablet by mouth two times a day please call [facility]. pharmacy states they did not receive [phone]   Method Requested: Telephone to Pharmacy Initial call taken by: Roe Coombs,  January 09, 2010 9:29 AM  Follow-up for Phone Call        CMA s/w pharmacist today and gave verbal order as per EMR fax did  not go through as per pharmacist. Danielle Rankin, CMA  January 09, 2010 9:59 AM     Prescriptions: LISINOPRIL 20 MG TABS (LISINOPRIL) 1/2  tablet by mouth two times a day  #30 x 11   Entered by:   Danielle Rankin, CMA   Authorized by:   [name], MD   Signed by:   Danielle Rankin, CMA on 01/09/2010   Method used:   Telephoned to ...       [facility]  [address] [phone]* (retail)       [address]       [place], [place]  [address]       Ph: [phone] or [phone]       Fax: [phone]   RxID:   [id number]

## 2010-03-03 NOTE — Assessment & Plan Note (Signed)
Summary: FU /NWS  Medications Added ASPIRIN 81 MG  TBEC (ASPIRIN) one by mouth every day VITAMIN D3 1000 UNIT  TABS (CHOLECALCIFEROL) 1 by mouth daily LISINOPRIL 20 MG TABS (LISINOPRIL) 1 tablet by mouth bid PLAVIX 75 MG TABS (CLOPIDOGREL BISULFATE) Take 1 tab by mouth daily CARVEDILOL 6.25 MG  TABS (CARVEDILOL) 1 tab by mouth two times daily SIMVASTATIN 80 MG TABS (SIMVASTATIN) 1 by mouth at bedtime B COMPLEX   TABS (B COMPLEX VITAMINS) 1 by mouth qd AVODART 0.5 MG CAPS (DUTASTERIDE) 1 by mouth daily      Allergies Added: ! PCN  Vital Signs:  Patient Profile:   77 Years Old Male Weight:      169 pounds Temp:     97.1 degrees F oral Pulse rate:   51 / minute BP sitting:   146 / 80  (left arm)  Vitals Entered By: Tora Perches (April 14, 2007 8:46 AM)             Is Patient Diabetic? No     Chief Complaint:  Multiple medical problems or concerns.  History of Present Illness: The patient presents for a follow up of hypertension, CAD, hyperlipidemia     Current Allergies (reviewed today): ! PCN  Past Medical History:    Coronary artery disease    Hyperlipidemia    Hypertension    Benign prostatic hypertrophy   Family History:    Family History Hypertension  Social History:    Retired    Married    Regular exercise-yes   Risk Factors:  Tobacco use:  never Exercise:  yes   Review of Systems  The patient denies chest pain, syncope, peripheral edema, severe indigestion/heartburn, suspicious skin lesions, difficulty walking, and depression.     Physical Exam  General:     Well-developed,well-nourished,in no acute distress; alert,appropriate and cooperative throughout examination Eyes:     No corneal or conjunctival inflammation noted. EOMI. Perrla. Funduscopic exam benign, without hemorrhages, exudates or papilledema. Vision grossly normal. Nose:     External nasal examination shows no deformity or inflammation. Nasal mucosa are pink and moist  without lesions or exudates. Mouth:     Oral mucosa and oropharynx without lesions or exudates.  Teeth in good repair. Neck:     No deformities, masses, or tenderness noted. Lungs:     Normal respiratory effort, chest expands symmetrically. Lungs are clear to auscultation, no crackles or wheezes. Heart:     Normal rate and regular rhythm. S1 and S2 normal without gallop, murmur, click, rub or other extra sounds. Abdomen:     Bowel sounds positive,abdomen soft and non-tender without masses, organomegaly or hernias noted. Msk:     No deformity or scoliosis noted of thoracic or lumbar spine.   Pulses:     R and L carotid,radial,femoral,dorsalis pedis and posterior tibial pulses are full and equal bilaterally Neurologic:     No cranial nerve deficits noted. Station and gait are normal. Plantar reflexes are down-going bilaterally. DTRs are symmetrical throughout. Sensory, motor and coordinative functions appear intact. Skin:     Aging changes Inguinal Nodes:     No significant adenopathy Psych:     Cognition and judgment appear intact. Alert and cooperative with normal attention span and concentration. No apparent delusions, illusions, hallucinations    Impression & Recommendations:  Problem # 1:  CORONARY ARTERY DISEASE (ICD-414.00) Assessment: Unchanged  His updated medication list for this problem includes:    Aspirin 81 Mg Tbec (Aspirin) ..... One  by mouth every day    Lisinopril 20 Mg Tabs (Lisinopril) .[name]... 1 tablet by mouth bid    Plavix 75 Mg Tabs (Clopidogrel bisulfate) .[name]... Take 1 tab by mouth daily    Carvedilol 6.25 Mg Tabs (Carvedilol) .[name]... 1 tab by mouth two times daily  Orders: TLB-BMP (Basic Metabolic Panel-BMET) (80048-METABOL) TLB-Lipid Panel (80061-LIPID) TLB-TSH (Thyroid Stimulating Hormone) (84443-TSH)   Problem # 2:  HYPERLIPIDEMIA (ICD-272.4) Assessment: Unchanged  His updated medication list for this problem includes:    Simvastatin 80 Mg Tabs  (Simvastatin) .[name]... 1 by mouth at bedtime   Problem # 3:  HYPERTENSION (ICD-401.9) Assessment: Unchanged  His updated medication list for this problem includes:    Lisinopril 20 Mg Tabs (Lisinopril) .[name]... 1 tablet by mouth bid    Carvedilol 6.25 Mg Tabs (Carvedilol) .[name]... 1 tab by mouth two times daily   Problem # 4:  BENIGN PROSTATIC HYPERTROPHY (ICD-600.00) Started Avodart.  Get labs Orders: TLB-PSA (Prostate Specific Antigen) (84153-PSA) TLB-Udip ONLY (81003-UDIP)   Complete Medication List: 1)  Aspirin 81 Mg Tbec (Aspirin) .... One by mouth every day 2)  Vitamin D3 1000 Unit Tabs (Cholecalciferol) .[name].. 1 by mouth daily 3)  Lisinopril 20 Mg Tabs (Lisinopril) .[name].. 1 tablet by mouth bid 4)  Plavix 75 Mg Tabs (Clopidogrel bisulfate) .... Take 1 tab by mouth daily 5)  Carvedilol 6.25 Mg Tabs (Carvedilol) .[name].. 1 tab by mouth two times daily 6)  Simvastatin 80 Mg Tabs (Simvastatin) .[name].. 1 by mouth at bedtime 7)  B Complex Tabs (B complex vitamins) .[name].. 1 by mouth qd 8)  Avodart 0.5 Mg Caps (Dutasteride) .[name].. 1 by mouth daily   Patient Instructions: 1)  Please schedule a follow-up appointment in 3 months.    Prescriptions: AVODART 0.5 MG CAPS (DUTASTERIDE) 1 by mouth daily  #90 x 3   Entered and Authorized by:   Tresa Garter MD   Signed by:   Tresa Garter MD on 04/14/2007   Method used:   Print then Give to Patient   RxID:   [id number] SIMVASTATIN 80 MG TABS (SIMVASTATIN) 1 by mouth at bedtime  #90 x 3   Entered and Authorized by:   Tresa Garter MD   Signed by:   Tresa Garter MD on 04/14/2007   Method used:   Print then Give to Patient   RxID:   [id number] CARVEDILOL 6.25 MG  TABS (CARVEDILOL) 1 tab by mouth two times daily  #180 x 3   Entered and Authorized by:   Tresa Garter MD   Signed by:   Tresa Garter MD on 04/14/2007   Method used:   Print then Give to Patient   RxID:   [id number]  ]

## 2010-03-03 NOTE — Progress Notes (Signed)
Summary: vertigo  Phone Note Call from Patient   Summary of Call: pt wife called and stated that the vertigo has not gone aware pt is now getting wobbly and spinning when he gets up. She states they dont know what else to do they came for a saturday clinic and pt is still having vertigo. Should he continue taking meds and is there anything else that can be done. She states should pt even be driving? Initial call taken by: Windell Norfolk,  March 07, 2008 12:03 PM  Follow-up for Phone Call        No driving if dizzy OV w/me tomorrow Follow-up by: Tresa Garter MD,  March 07, 2008 1:37 PM  Additional Follow-up for Phone Call Additional follow up Details #1::        called pt and wife made aware set appt for tomorrow Additional Follow-up by: Windell Norfolk,  March 07, 2008 3:07 PM

## 2010-03-03 NOTE — Op Note (Signed)
Summary: [facility]  [facility]   Imported By: Sherian Rein 01/20/2009 12:16:52  _____________________________________________________________________  External Attachment:    Type:   Image     Comment:   External Document

## 2010-03-03 NOTE — Assessment & Plan Note (Signed)
Summary: 4 MO F/U  $50   CD   Vital Signs:  Patient Profile:   77 Years Old Male Weight:      169 pounds Temp:     97 degrees F oral Pulse rate:   52 / minute BP sitting:   126 / 60  (left arm)  Vitals Entered By: Tora Perches (December 04, 2007 9:57 AM)                 Chief Complaint:  Multiple medical problems or concerns.  History of Present Illness: The patient presents for a follow up of hypertension, CAD, hyperlipidemia     Current Allergies (reviewed today): ! PCN  Past Medical History:    Reviewed history from 04/14/2007 and no changes required:       Coronary artery disease       Hyperlipidemia       Hypertension       Benign prostatic hypertrophy       Poss. Gilbert's   Family History:    Reviewed history from 04/14/2007 and no changes required:       Family History Hypertension  Social History:    Reviewed history from 04/14/2007 and no changes required:       Retired       Married       Regular exercise-yes    Review of Systems  The patient denies chest pain and dyspnea on exertion.     Physical Exam  General:     Well-developed,well-nourished,in no acute distress; alert,appropriate and cooperative throughout examination Head:     Normocephalic and atraumatic without obvious abnormalities. No apparent alopecia or balding. Eyes:     No corneal or conjunctival inflammation noted. EOMI. Perrla. Funduscopic exam benign, without hemorrhages, exudates or papilledema. Vision grossly normal. Nose:     External nasal examination shows no deformity or inflammation. Nasal mucosa are pink and moist without lesions or exudates. Mouth:     Oral mucosa and oropharynx without lesions or exudates.  Teeth in good repair. Neck:     No deformities, masses, or tenderness noted. Lungs:     Normal respiratory effort, chest expands symmetrically. Lungs are clear to auscultation, no crackles or wheezes. Heart:     RRR Abdomen:     Bowel sounds  positive,abdomen soft and non-tender without masses, organomegaly or hernias noted. Msk:     No deformity or scoliosis noted of thoracic or lumbar spine.   Skin:     Aging changes Psych:     Cognition and judgment appear intact. Alert and cooperative with normal attention span and concentration. No apparent delusions, illusions, hallucinations    Impression & Recommendations:  Problem # 1:  CORONARY ARTERY DISEASE (ICD-414.00) Assessment: Unchanged  His updated medication list for this problem includes:    Lisinopril 20 Mg Tabs (Lisinopril) .[name]... 1 tablet by mouth bid    Plavix 75 Mg Tabs (Clopidogrel bisulfate) .[name]... Take 1 tab by mouth daily    Carvedilol 6.25 Mg Tabs (Carvedilol) .[name]... 1 tab by mouth two times daily    Aspirin 81 Mg Tbec (Aspirin) ..... One by mouth every day   Problem # 2:  HYPERTENSION (ICD-401.9) Assessment: Comment Only  His updated medication list for this problem includes:    Lisinopril 20 Mg Tabs (Lisinopril) .[name]... 1 tablet by mouth bid    Carvedilol 6.25 Mg Tabs (Carvedilol) .[name]... 1 tab by mouth two times daily   Problem # 3:  HYPERLIPIDEMIA (ICD-272.4) Assessment: Comment  Only  His updated medication list for this problem includes:    Simvastatin 80 Mg Tabs (Simvastatin) .[name]... 1 by mouth at bedtime   Problem # 4:  BENIGN PROSTATIC HYPERTROPHY (ICD-600.00) Assessment: Unchanged  Complete Medication List: 1)  Lisinopril 20 Mg Tabs (Lisinopril) .[name].. 1 tablet by mouth bid 2)  Plavix 75 Mg Tabs (Clopidogrel bisulfate) .... Take 1 tab by mouth daily 3)  Carvedilol 6.25 Mg Tabs (Carvedilol) .[name].. 1 tab by mouth two times daily 4)  Simvastatin 80 Mg Tabs (Simvastatin) .[name].. 1 by mouth at bedtime 5)  Aspirin 81 Mg Tbec (Aspirin) .... One by mouth every day 6)  Vitamin D3 1000 Unit Tabs (Cholecalciferol) .[name].. 1 by mouth daily 7)  B Complex Tabs (B complex vitamins) .[name].. 1 by mouth qd   Patient Instructions: 1)  Please schedule a follow-up  appointment in 6 months. 2)  BMP prior to visit, ICD-9: 3)  Lipid Panel prior to visit, ICD-9: 272.0 4)  hepatic panel 5)  TSH prior to visit, ICD-9:   ]

## 2010-03-03 NOTE — Assessment & Plan Note (Signed)
Summary: skin bx/#/cd   Vital Signs:  Patient profile:   77 year old male Weight:      171 pounds Temp:     98 degrees F oral Pulse rate:   50 / minute BP sitting:   110 / 60  (left arm)  Vitals Entered By: Tora Perches (December 18, 2008 9:53 AM)  Procedure Note  Mole Biopsy/Removal: The patient complains of irritation and changing mole. Indication: suspicious lesion Consent signed: yes  Procedure # 1: shave biopsy    Size (in cm): 3.0 x 3.1    Region: medial    Location: top of the scalp    Comment: Risks including but not limited by incomplete procedure, bleeding, infection, recurrence were discussed with the patient. Consent form was signed.     Instrument used: Dermablade    Anesthesia: 2.0 ml 1% lidocaine w/epinephrine  Procedure # 2: shave biopsy    Size (in cm): 1.7 x 0.7    Region: lateral    Location: R temple    Comment: Both lesions were partially shaved and sent to pathology; the rest of the lesions were treated w/hyfercator    Instrument used: dermablade    Anesthesia: 1.0 ml 1% lidocaine w/epinephrine  Cleaned and prepped with: alcohol and scrubbing Wound dressing: neosporin and bandaid Instructions: daily dressing changes Additional Instructions: Call if you are not better in a reasonable amount of time or if worse.   CC: skin bx Is Patient Diabetic? No   Primary Care Provider:  Tresa Garter MD  CC:  skin bx.  History of Present Illness: Skin bx   Preventive Screening-Counseling & Management  Alcohol-Tobacco     Smoking Status: never  Current Medications (verified): 1)  Lisinopril 20 Mg Tabs (Lisinopril) .[name].. 1 Tablet By Mouth Bid 2)  Plavix 75 Mg Tabs (Clopidogrel Bisulfate) .... Take 1 Tab By Mouth Daily 3)  Carvedilol 6.25 Mg  Tabs (Carvedilol) .[name].. 1 Tab By Mouth Two Times Daily 4)  Simvastatin 80 Mg Tabs (Simvastatin) .[name].. 1 By Mouth At Bedtime 5)  Aspirin 81 Mg  Tbec (Aspirin) .... One By Mouth Every Day 6)  Vitamin D3 1000  Unit  Tabs (Cholecalciferol) .[name].. 1 By Mouth Daily 7)  B Complex   Tabs (B Complex Vitamins) .[name].. 1 By Mouth Qd 8)  Meclizine Hcl 12.5 Mg Tabs (Meclizine Hcl) .[name].. 1-2 Tab Qid  As Needed 9)  Nitroglycerin 0.4 Mg Subl (Nitroglycerin) .... One Tablet Under Tongue Every 5 Minutes As Needed For Chest Pain---May Repeat Times Three 10)  Glucosamine Triple Strength .... Take 1 Tablet By Mouth Two Times A Day  Allergies: 1)  ! Pcn   Impression & Recommendations:  Problem # 1:  NEOPLASM, SKIN, UNCERTAIN BEHAVIOR (ICD-238.2) Assessment Comment Only Tol. well. Compl. none. Orders: Shave Skin Lesion > 2.0 cm, scalp/neck/hands/feet/genetalia (91478) Shave Skin Lesion 1.1-2.0cm face/ears/eyelids/nose/lips/mm (29562)  Complete Medication List: 1)  Lisinopril 20 Mg Tabs (Lisinopril) .[name].. 1 tablet by mouth bid 2)  Plavix 75 Mg Tabs (Clopidogrel bisulfate) .... Take 1 tab by mouth daily 3)  Carvedilol 6.25 Mg Tabs (Carvedilol) .[name].. 1 tab by mouth two times daily 4)  Simvastatin 80 Mg Tabs (Simvastatin) .[name].. 1 by mouth at bedtime 5)  Aspirin 81 Mg Tbec (Aspirin) .... One by mouth every day 6)  Vitamin D3 1000 Unit Tabs (Cholecalciferol) .[name].. 1 by mouth daily 7)  B Complex Tabs (B complex vitamins) .[name].. 1 by mouth qd 8)  Meclizine Hcl 12.5 Mg Tabs (Meclizine  hcl) .... 1-2 tab qid  as needed 9)  Nitroglycerin 0.4 Mg Subl (Nitroglycerin) .... One tablet under tongue every 5 minutes as needed for chest pain---may repeat times three 10)  Glucosamine Triple Strength  .... Take 1 tablet by mouth two times a day

## 2010-03-03 NOTE — Progress Notes (Signed)
   Walk in Patient Form Recieved "Bp Readings" Will forward to [facility] who is Covering [place] today  [facility]  November 25, 2008 9:03 AM    Appended Document:  reviewed by Dr. Clifton James. Will send to medical records to be scanned. Pt. notified that Dr. Clifton James has reviewed readings.

## 2010-03-03 NOTE — Miscellaneous (Signed)
Summary: Orders Update  Clinical Lists Changes  Orders: Added new Test order of Carotid Duplex (Carotid Duplex) - Signed 

## 2010-03-03 NOTE — Miscellaneous (Signed)
Summary: [facility]  [facility]   Imported By: Sherian Rein 12/20/2008 14:10:54  _____________________________________________________________________  External Attachment:    Type:   Image     Comment:   External Document

## 2010-03-03 NOTE — Assessment & Plan Note (Signed)
Summary: SKIN BX/NWS   Vital Signs:  Patient profile:   77 year old male Height:      67 inches Weight:      166 pounds BMI:     26.09 O2 Sat:      97 % on Room air Temp:     97.4 degrees F oral Pulse rate:   53 / minute Pulse rhythm:   regular Resp:     16 per minute BP sitting:   98 / 52  (left arm) Cuff size:   regular  Vitals Entered By: Lanier Prude, CMA(AAMA) (August 29, 2009 9:36 AM)  O2 Flow:  Room air  Procedure Note Last Tetanus: Tdap (04/22/2009)  Biopsy: Biopsy Type: Skin The patient complains of changing mole and suspicious lesion. Indication: changing lesion Consent signed: yes  Procedure # 1: shave biopsy    Size (in cm): 1.1 x 1.1    Region: R    Location: temporal scalp    Comment: Risks including but not limited by incomplete procedure, bleeding, infection, recurrence were discussed with the patient. Consent form was signed. Tolerated well. Complicatons - none.    Instrument used: dermablade    Anesthesia: 1.0 ml 1% lidocaine w/epinephrine  Procedure # 2: shave biopsy    Size (in cm): 0.9 x 0.9    Region: Left    Location: temporal scalp    Comment: Mole #3: 21x18 mm on L arm was removed as well. 2.0 cc of Lido was used.    Instrument used: same    Anesthesia: 0.5 ml 1% lidocaine w/epinephrine  Cleaned and prepped with: alcohol and betadine Wound dressing: bandaid Instructions: daily dressing changes Additional Instructions: Silvadene cream  CC: 2 lesions on top of head Is Patient Diabetic? No   Primary Care Provider:  Tresa Garter MD  CC:  2 lesions on top of head.  History of Present Illness: He is here for skin biopsy   Current Medications (verified): 1)  Lisinopril 20 Mg Tabs (Lisinopril) .[name].. 1 Tablet By Mouth Bid 2)  Plavix 75 Mg Tabs (Clopidogrel Bisulfate) .... Take 1 Tab By Mouth Daily 3)  Carvedilol 6.25 Mg  Tabs (Carvedilol) .[name].. 1 Tab By Mouth Two Times Daily 4)  Simvastatin 80 Mg Tabs (Simvastatin) .[name].. 1 By Mouth  At Bedtime 5)  Aspirin 81 Mg  Tbec (Aspirin) .... One By Mouth Every Day 6)  Vitamin D3 1000 Unit  Tabs (Cholecalciferol) .[name].. 1 By Mouth Daily 7)  B Complex   Tabs (B Complex Vitamins) .[name].. 1 By Mouth Qd 8)  Nitroglycerin 0.4 Mg Subl (Nitroglycerin) .... One Tablet Under Tongue Every 5 Minutes As Needed For Chest Pain---May Repeat Times Three 9)  Glucosamine Maximum Strength 1500 Mg Tabs (Glucosamine Hcl) .[name].. 1 Tab Two Times A Day  Allergies (verified): 1)  ! Pcn  Physical Exam  Skin:  see procedure note   Impression & Recommendations:  Problem # 1:  NEOPLASM, SKIN, UNCERTAIN BEHAVIOR (ICD-238.2) Assessment Comment Only  Orders: Shave Skin Lesion  >2.0 cm,/trunk,/arm/leg (04540) Shave Skin Lesion 1.1-2.0 cm, scalp/neck/hands/feet/genetalia (98119) Shave Skin Lesion 0.6-1.0cm scalp/neck/hands/feet/genitalia (14782)  Complete Medication List: 1)  Lisinopril 20 Mg Tabs (Lisinopril) .[name].. 1 tablet by mouth bid 2)  Plavix 75 Mg Tabs (Clopidogrel bisulfate) .... Take 1 tab by mouth daily 3)  Carvedilol 6.25 Mg Tabs (Carvedilol) .[name].. 1 tab by mouth two times daily 4)  Simvastatin 80 Mg Tabs (Simvastatin) .[name].. 1 by mouth at bedtime 5)  Aspirin 81 Mg Tbec (  Aspirin) .... One by mouth every day 6)  Vitamin D3 1000 Unit Tabs (Cholecalciferol) .[name].. 1 by mouth daily 7)  B Complex Tabs (B complex vitamins) .[name].. 1 by mouth qd 8)  Nitroglycerin 0.4 Mg Subl (Nitroglycerin) .... One tablet under tongue every 5 minutes as needed for chest pain---may repeat times three 9)  Glucosamine Maximum Strength 1500 Mg Tabs (Glucosamine hcl) .[name].. 1 tab two times a day

## 2010-03-03 NOTE — Miscellaneous (Signed)
Summary: [facility]  [facility]   Imported By: Sherian Rein 09/01/2009 13:24:10  _____________________________________________________________________  External Attachment:    Type:   Image     Comment:   External Document

## 2010-03-03 NOTE — Assessment & Plan Note (Signed)
Summary: 6 month rov/sl  Medications Added GLUCOSAMINE MAXIMUM STRENGTH 1500 MG TABS (GLUCOSAMINE HCL) 1 tab two times a day        Visit Type:  6 mo f/u Primary Provider:  Tresa Garter MD  CC:  no cardiac complaints today.  History of Present Illness: 77 yo WM with h/o CAD s/p posterior inferior MI 2002 with placement of a bare metal stent in the circumflex artery and subsequent unstable angina August 2007 with placement of drug eluting stent in the mid LAD , also history of HTN, hyperlipidemia who returns today for routine cardiac folow up. He has been doing well. He continues to exercise on a daily basis. He has had no chest pain, shortness of breath, dizziness, palpitations, near syncope, syncope, orthopnea or lower ext edema. He has been taking all of his medications as prescribed.   Recent TC 133   LDL 47  HDL  74 last month. LFTs ok. He is asking if it is safe to have sex and also if Viagra is safe.   Current Medications (verified): 1)  Lisinopril 20 Mg Tabs (Lisinopril) .[name].. 1 Tablet By Mouth Bid 2)  Plavix 75 Mg Tabs (Clopidogrel Bisulfate) .... Take 1 Tab By Mouth Daily 3)  Carvedilol 6.25 Mg  Tabs (Carvedilol) .[name].. 1 Tab By Mouth Two Times Daily 4)  Simvastatin 80 Mg Tabs (Simvastatin) .[name].. 1 By Mouth At Bedtime 5)  Aspirin 81 Mg  Tbec (Aspirin) .... One By Mouth Every Day 6)  Vitamin D3 1000 Unit  Tabs (Cholecalciferol) .[name].. 1 By Mouth Daily 7)  B Complex   Tabs (B Complex Vitamins) .[name].. 1 By Mouth Qd 8)  Nitroglycerin 0.4 Mg Subl (Nitroglycerin) .... One Tablet Under Tongue Every 5 Minutes As Needed For Chest Pain---May Repeat Times Three 9)  Glucosamine Maximum Strength 1500 Mg Tabs (Glucosamine Hcl) .[name].. 1 Tab Two Times A Day  Allergies: 1)  ! Pcn  Past History:  Past Medical History: Reviewed history from 06/12/2008 and no changes required. CORONARY ARTERY DISEASE (ICD-414.00) HYPERTENSION (ICD-401.9) HYPERLIPIDEMIA (ICD-272.4) BENIGN PROSTATIC HYPERTROPHY  (ICD-600.00) DR Logan Bores THROMBOCYTOPENIA (ICD-287.5) DEGENERATIVE JOINT DISEASE (ICD-715.90) VERTIGO (ICD-780.4)  BPV 2010 Poss. [facility]  Social History: Reviewed history from 04/29/2008 and no changes required. Retired Married Regular exercise-yes Tobacco Use - No.  Alcohol Use - no Drug Use - no  Review of Systems  The patient denies fatigue, malaise, fever, weight gain/loss, vision loss, decreased hearing, hoarseness, chest pain, palpitations, shortness of breath, prolonged cough, wheezing, sleep apnea, coughing up blood, abdominal pain, blood in stool, nausea, vomiting, diarrhea, heartburn, incontinence, blood in urine, muscle weakness, joint pain, leg swelling, rash, skin lesions, headache, fainting, dizziness, depression, anxiety, enlarged lymph nodes, easy bruising or bleeding, and environmental allergies.    Vital Signs:  Patient profile:   77 year old male Height:      67 inches Weight:      165 pounds Pulse rate:   50 / minute Pulse rhythm:   regular BP sitting:   149 / 78  (left arm) Cuff size:   large  Vitals Entered By: Danielle Rankin, CMA (May 12, 2009 11:08 AM)  Physical Exam  General:  General: Well developed, well nourished, NAD HEENT: OP clear, mucus membranes moist Musculoskeletal: Muscle strength 5/5 all ext Psychiatric: Mood and affect normal Neck: No JVD, no carotid bruits, no thyromegaly, no lymphadenopathy. Lungs:Clear bilaterally, no wheezes, rhonci, crackles CV: RRR no murmurs, gallops rubs Abdomen: soft, NT, ND, BS present Extremities: No edema,  pulses 2+.    Impression & Recommendations:  Problem # 1:  CORONARY ARTERY DISEASE (ICD-414.00) Stable. No medication changes.   His updated medication list for this problem includes:    Lisinopril 20 Mg Tabs (Lisinopril) .[name]... 1 tablet by mouth bid    Plavix 75 Mg Tabs (Clopidogrel bisulfate) .[name]... Take 1 tab by mouth daily    Carvedilol 6.25 Mg Tabs (Carvedilol) .[name]... 1 tab by mouth two times  daily    Aspirin 81 Mg Tbec (Aspirin) ..... One by mouth every day    Nitroglycerin 0.4 Mg Subl (Nitroglycerin) ..... One tablet under tongue every 5 minutes as needed for chest pain---may repeat times three  Problem # 2:  HYPERTENSION (ICD-401.9) Slight elevation today. He will check this at home and let Dr. Mertie Clause know if it remains elevated.  Continue current therapy.   His updated medication list for this problem includes:    Lisinopril 20 Mg Tabs (Lisinopril) .[name]... 1 tablet by mouth bid    Carvedilol 6.25 Mg Tabs (Carvedilol) .[name]... 1 tab by mouth two times daily    Aspirin 81 Mg Tbec (Aspirin) ..... One by mouth every day  Problem # 3:  HYPERLIPIDEMIA (ICD-272.4) Well controlled on statin therapy. Recent LFTs ok.   His updated medication list for this problem includes:    Simvastatin 80 Mg Tabs (Simvastatin) .[name]... 1 by mouth at bedtime  Problem # 4:  ERECTILE DYSFUNCTION, ORGANIC (ICD-607.84) I have told him that it should be safe to have sexual intercourse. He will let us know if he wishes to receive Viagra. If he calls back, we can send it in to his pharmacy.   Patient Instructions: 1)  Your physician recommends that you schedule a follow-up appointment in: 6 months 2)  Your physician recommends that you continue on your current medications as directed. Please refer to the Current Medication list given to you today.

## 2010-03-03 NOTE — Miscellaneous (Signed)
Summary[facility]   Imported By: [name] 05/02/2009 08:14:55  _____________________________________________________________________  External Attachment:    Type:   Image     Comment:   External Document

## 2010-03-03 NOTE — Assessment & Plan Note (Signed)
Summary: 6 MTH FU STC   Vital Signs:  Patient profile:   77 year old male Height:      67 inches Weight:      166 pounds BMI:     26.09 Temp:     98.6 degrees F oral Pulse rate:   64 / minute Pulse rhythm:   regular Resp:     16 per minute BP sitting:   120 / 68  (left arm) Cuff size:   regular  Vitals Entered By: Lanier Prude, CMA(AAMA) (October 21, 2009 10:00 AM) CC: 6 mo f/u Is Patient Diabetic? No   Primary Care Provider:  Tresa Garter MD  CC:  6 mo f/u.  History of Present Illness: The patient presents for a follow up of hypertension, CAD, hyperlipidemia C/o face lesions    Preventive Screening-Counseling & Management  Alcohol-Tobacco     Smoking Status: never  Current Medications (verified): 1)  Lisinopril 20 Mg Tabs (Lisinopril) .[name].. 1 Tablet By Mouth Bid 2)  Plavix 75 Mg Tabs (Clopidogrel Bisulfate) .... Take 1 Tab By Mouth Daily 3)  Carvedilol 6.25 Mg  Tabs (Carvedilol) .[name].. 1 Tab By Mouth Two Times Daily 4)  Simvastatin 80 Mg Tabs (Simvastatin) .[name].. 1 By Mouth At Bedtime 5)  Aspirin 81 Mg  Tbec (Aspirin) .... One By Mouth Every Day 6)  Vitamin D3 1000 Unit  Tabs (Cholecalciferol) .[name].. 1 By Mouth Daily 7)  B Complex   Tabs (B Complex Vitamins) .[name].. 1 By Mouth Qd 8)  Nitroglycerin 0.4 Mg Subl (Nitroglycerin) .... One Tablet Under Tongue Every 5 Minutes As Needed For Chest Pain---May Repeat Times Three 9)  Glucosamine Maximum Strength 1500 Mg Tabs (Glucosamine Hcl) .[name].. 1 Tab Two Times A Day  Allergies (verified): 1)  ! Pcn  Past History:  Past Medical History: Last updated: 06/12/2008 CORONARY ARTERY DISEASE (ICD-414.00) HYPERTENSION (ICD-401.9) HYPERLIPIDEMIA (ICD-272.4) BENIGN PROSTATIC HYPERTROPHY (ICD-600.00) DR Logan Bores THROMBOCYTOPENIA (ICD-287.5) DEGENERATIVE JOINT DISEASE (ICD-715.90) VERTIGO (ICD-780.4)  BPV 2010 Poss. [facility]  Social History: Last updated: 04/29/2008 Retired Married Regular exercise-yes Tobacco Use - No.   Alcohol Use - no Drug Use - no  Review of Systems  The patient denies fever, dyspnea on exertion, abdominal pain, and difficulty walking.    Physical Exam  General:  General: Well developed, well nourished, NAD HEENT: OP clear, mucus membranes moist SKIN: warm, dry Psychiatric: Mood and affect normal Neck: No JVD, no carotid bruits, no thyromegaly, no lymphadenopathy. Lungs:Clear bilaterally, no wheezes, rhonci, crackles CV: RRR no murmurs, gallops rubs Abdomen: soft, NT, ND, BS present Extremities: No edema, pulses 1+ bilateral PT/DP.  Nose:  External nasal examination shows no deformity or inflammation. Nasal mucosa are pink and moist without lesions or exudates. Mouth:  Oral mucosa and oropharynx without lesions or exudates.  Teeth in good repair. Lungs:  Normal respiratory effort, chest expands symmetrically. Lungs are clear to auscultation, no crackles or wheezes. Heart:  RRR Abdomen:  Bowel sounds positive,abdomen soft and non-tender without masses, organomegaly or hernias noted. Msk:  No deformity or scoliosis noted of thoracic or lumbar spine.   Neurologic:  No cranial nerve deficits noted. Station and gait are normal. Plantar reflexes are down-going bilaterally. DTRs are symmetrical throughout. Sensory, motor and coordinative functions appear intact. Skin:  AKs on face Psych:  Cognition and judgment appear intact. Alert and cooperative with normal attention span and concentration. No apparent delusions, illusions, hallucinationsnot anxious appearing and not depressed appearing.     Impression & Recommendations:  Problem #  1:  HYPERKALEMIA (ICD-276.7) ?artifact Assessment New Repeat labs is pending   Problem # 2:  PSA, INCREASED (ICD-790.93) Assessment: Unchanged The labs were reviewed with the patient.   Problem # 3:  ACTINIC KERATOSIS (ICD-702.0) face Assessment: New  Procedure: cryo Indication: AK(s) Risks incl. scar(s), incomplete removal, ect.  and benefits  discussed    6  lesion(s) on face was/were treated with liqid N2 in usual fasion.  Tolerated well. Compl. none. Wound care instructions given.   Orders: Cryotherapy/Destruction benign or premalignant lesion (1st lesion)  (17000) Cryotherapy/Destruction benign or premalignant lesion (2nd-14th lesions) (17003)  Problem # 4:  CORONARY ARTERY DISEASE (ICD-414.00) Assessment: Unchanged  His updated medication list for this problem includes:    Lisinopril 20 Mg Tabs (Lisinopril) .[name]... 1/2  tablet by mouth two times a day    Plavix 75 Mg Tabs (Clopidogrel bisulfate) .[name]... Take 1 tab by mouth daily    Carvedilol 6.25 Mg Tabs (Carvedilol) .[name]... 1 tab by mouth two times daily    Aspirin 81 Mg Tbec (Aspirin) ..... One by mouth every day    Nitroglycerin 0.4 Mg Subl (Nitroglycerin) ..... One tablet under tongue every 5 minutes as needed for chest pain---may repeat times three  Complete Medication List: 1)  Lisinopril 20 Mg Tabs (Lisinopril) .... 1/2  tablet by mouth two times a day 2)  Plavix 75 Mg Tabs (Clopidogrel bisulfate) .... Take 1 tab by mouth daily 3)  Carvedilol 6.25 Mg Tabs (Carvedilol) .[name].. 1 tab by mouth two times daily 4)  Simvastatin 80 Mg Tabs (Simvastatin) .[name].. 1 by mouth at bedtime 5)  Aspirin 81 Mg Tbec (Aspirin) .... One by mouth every day 6)  Vitamin D3 1000 Unit Tabs (Cholecalciferol) .[name].. 1 by mouth daily 7)  B Complex Tabs (B complex vitamins) .[name].. 1 by mouth qd 8)  Nitroglycerin 0.4 Mg Subl (Nitroglycerin) .... One tablet under tongue every 5 minutes as needed for chest pain---may repeat times three 9)  Glucosamine Maximum Strength 1500 Mg Tabs (Glucosamine hcl) .[name].. 1 tab two times a day  Patient Instructions: 1)  Please schedule a follow-up appointment in 4 months. 2)  BMP prior to visit, ICD-9: 995.20    Not Administered:    Influenza Vaccine not given due to: declined

## 2010-03-03 NOTE — Assessment & Plan Note (Signed)
Summary: 3 MO ROV/ $50 /NWS   Vital Signs:  Patient Profile:   77 Years Old Male Weight:      168 pounds Temp:     97.3 degrees F oral Pulse rate:   45 / minute BP sitting:   122 / 67  (left arm)  Vitals Entered By: Tora Perches (July 31, 2007 10:31 AM)                 Chief Complaint:  Multiple medical problems or concerns.  History of Present Illness: The patient presents for a follow up of hypertension, CAd, hyperlipidemia     Current Allergies (reviewed today): ! PCN  Past Medical History:    Reviewed history from 04/14/2007 and no changes required:       Coronary artery disease       Hyperlipidemia       Hypertension       Benign prostatic hypertrophy   Family History:    Reviewed history from 04/14/2007 and no changes required:       Family History Hypertension  Social History:    Reviewed history from 04/14/2007 and no changes required:       Retired       Married       Regular exercise-yes    Review of Systems  The patient denies anorexia, chest pain, syncope, dyspnea on exertion, headaches, and abdominal pain.     Physical Exam  General:     Well-developed,well-nourished,in no acute distress; alert,appropriate and cooperative throughout examination Eyes:     No corneal or conjunctival inflammation noted. EOMI. Perrla. Funduscopic exam benign, without hemorrhages, exudates or papilledema. Vision grossly normal. Nose:     External nasal examination shows no deformity or inflammation. Nasal mucosa are pink and moist without lesions or exudates. Mouth:     Oral mucosa and oropharynx without lesions or exudates.  Teeth in good repair. Neck:     No deformities, masses, or tenderness noted. Lungs:     Normal respiratory effort, chest expands symmetrically. Lungs are clear to auscultation, no crackles or wheezes. Heart:     Normal rate and regular rhythm. S1 and S2 normal without gallop, murmur, click, rub or other extra sounds. Abdomen:  Bowel sounds positive,abdomen soft and non-tender without masses, organomegaly or hernias noted. Genitalia:     Testes bilaterally descended without nodularity, tenderness or masses. No scrotal masses or lesions. No penis lesions or urethral discharge. Msk:     No deformity or scoliosis noted of thoracic or lumbar spine.   Neurologic:     No cranial nerve deficits noted. Station and gait are normal. Plantar reflexes are down-going bilaterally. DTRs are symmetrical throughout. Sensory, motor and coordinative functions appear intact. Skin:     Intact without suspicious lesions or rashes Psych:     Cognition and judgment appear intact. Alert and cooperative with normal attention span and concentration. No apparent delusions, illusions, hallucinations    Impression & Recommendations:  Problem # 1:  CORONARY ARTERY DISEASE (ICD-414.00)  His updated medication list for this problem includes:    Lisinopril 20 Mg Tabs (Lisinopril) .[name]... 1 tablet by mouth bid    Plavix 75 Mg Tabs (Clopidogrel bisulfate) .[name]... Take 1 tab by mouth daily    Carvedilol 6.25 Mg Tabs (Carvedilol) .[name]... 1 tab by mouth two times daily    Aspirin 81 Mg Tbec (Aspirin) ..... One by mouth every day   Problem # 2:  HYPERLIPIDEMIA (ICD-272.4)  His updated medication list  for this problem includes:    Simvastatin 80 Mg Tabs (Simvastatin) .[name]... 1 by mouth at bedtime   Problem # 3:  CORONARY ARTERY DISEASE (ICD-414.00)  His updated medication list for this problem includes:    Lisinopril 20 Mg Tabs (Lisinopril) .[name]... 1 tablet by mouth bid    Plavix 75 Mg Tabs (Clopidogrel bisulfate) .[name]... Take 1 tab by mouth daily    Carvedilol 6.25 Mg Tabs (Carvedilol) .[name]... 1 tab by mouth two times daily    Aspirin 81 Mg Tbec (Aspirin) ..... One by mouth every day   Problem # 4:  BENIGN PROSTATIC HYPERTROPHY (ICD-600.00)  Complete Medication List: 1)  Lisinopril 20 Mg Tabs (Lisinopril) .[name].. 1 tablet by mouth bid 2)  Plavix 75 Mg  Tabs (Clopidogrel bisulfate) .... Take 1 tab by mouth daily 3)  Carvedilol 6.25 Mg Tabs (Carvedilol) .[name].. 1 tab by mouth two times daily 4)  Simvastatin 80 Mg Tabs (Simvastatin) .[name].. 1 by mouth at bedtime 5)  Avodart 0.5 Mg Caps (Dutasteride) .[name].. 1 by mouth daily 6)  Aspirin 81 Mg Tbec (Aspirin) .... One by mouth every day 7)  Vitamin D3 1000 Unit Tabs (Cholecalciferol) .[name].. 1 by mouth daily 8)  B Complex Tabs (B complex vitamins) .[name].. 1 by mouth qd   Patient Instructions: 1)  BMP prior to visit, ICD-9: 2)  Hepatic Panel prior to visit, ICD-9:414.8  995.2 3)  Lipid Panel prior to visit, ICD-9: 4)  CBC w/ Diff prior to visit, ICD-9:   Prescriptions: PLAVIX 75 MG TABS (CLOPIDOGREL BISULFATE) Take 1 tab by mouth daily  #30 x 12   Entered and Authorized by:   Tresa Garter MD   Signed by:   Tresa Garter MD on 07/31/2007   Method used:   Print then Give to Patient   RxID:   [id number]  ]

## 2010-03-03 NOTE — Assessment & Plan Note (Signed)
Summary: 6 MTH FU---$50---STC   Vital Signs:  Patient profile:   77 year old male Height:      67 inches Weight:      169 pounds Temp:     98.2 degrees F BP sitting:   116 / 60  (left arm)  Vitals Entered By: Tora Perches (Jun 12, 2008 11:31 AM) CC: f/u Is Patient Diabetic? No   Primary Care Provider:  Tresa Garter MD  CC:  f/u.  History of Present Illness: The patient presents for a follow up of hypertension, CAD, hyperlipidemia   Current Medications (verified): 1)  Lisinopril 20 Mg Tabs (Lisinopril) .[name].. 1 Tablet By Mouth Bid 2)  Plavix 75 Mg Tabs (Clopidogrel Bisulfate) .... Take 1 Tab By Mouth Daily 3)  Carvedilol 6.25 Mg  Tabs (Carvedilol) .[name].. 1 Tab By Mouth Two Times Daily 4)  Simvastatin 80 Mg Tabs (Simvastatin) .[name].. 1 By Mouth At Bedtime 5)  Aspirin 81 Mg  Tbec (Aspirin) .... One By Mouth Every Day 6)  Vitamin D3 1000 Unit  Tabs (Cholecalciferol) .[name].. 1 By Mouth Daily 7)  B Complex   Tabs (B Complex Vitamins) .[name].. 1 By Mouth Qd 8)  Meclizine Hcl 12.5 Mg Tabs (Meclizine Hcl) .[name].. 1-2 Tab Qid  As Needed 9)  Nitroglycerin 0.4 Mg Subl (Nitroglycerin) .... One Tablet Under Tongue Every 5 Minutes As Needed For Chest Pain---May Repeat Times Three 10)  Glucosamine Triple Strength .... Take 1 Tablet By Mouth Two Times A Day  Allergies: 1)  ! Pcn  Past History:  Past Surgical History:    Colooscopy x2    Hip Arthroplasty-Total    TURP 2007 (04/29/2008)  Family History:    Family History Hypertension    Mother: died at 34 MI had known CAD    Father: died MVA at 74    Siblings: CAD     (04/29/2008)  Social History:    Retired    Married    Regular exercise-yes    Tobacco Use - No.     Alcohol Use - no    Drug Use - no     (04/29/2008)  Past Medical History:    CORONARY ARTERY DISEASE (ICD-414.00)    HYPERTENSION (ICD-401.9)    HYPERLIPIDEMIA (ICD-272.4)    BENIGN PROSTATIC HYPERTROPHY (ICD-600.00) DR Logan Bores    THROMBOCYTOPENIA (ICD-287.5)  DEGENERATIVE JOINT DISEASE (ICD-715.90)    VERTIGO (ICD-780.4)     BPV 2010    Poss. Gilbert's  Physical Exam  General:  General: Well developed, well nourished, NAD HEENT: OP clear, mucus membranes moist SKIN: warm, dry Neuro: No focal deficits Musculoskeletal: Muscle strength 5/5 all ext Psychiatric: Mood and affect normal Neck: No JVD, no carotid bruits, no thyromegaly, no lymphadenopathy. Lungs:Clear bilaterally, no wheezes, rhonci, crackles CV: RRR, soft systolic  murmur at apex, NO  gallops rubs Abdomen: soft, NT, ND, BS present Extremities: No edema, pulses 2+.  Ears:  External ear exam shows no significant lesions or deformities.  Otoscopic examination reveals clear canals, tympanic membranes are intact bilaterally without bulging, retraction, inflammation or discharge. Hearing is grossly normal bilaterally. Nose:  External nasal examination shows no deformity or inflammation. Nasal mucosa are pink and moist without lesions or exudates. Mouth:  Oral mucosa and oropharynx without lesions or exudates.  Teeth in good repair. Neck:  No deformities, masses, or tenderness noted. Lungs:  Normal respiratory effort, chest expands symmetrically. Lungs are clear to auscultation, no crackles or wheezes. Heart:  RRR Abdomen:  Bowel sounds positive,abdomen  soft and non-tender without masses, organomegaly or hernias noted. Msk:  No deformity or scoliosis noted of thoracic or lumbar spine.   Pulses:  R and L carotid,radial,femoral,dorsalis pedis and posterior tibial pulses are full and equal bilaterally Neurologic:  No cranial nerve deficits noted. Station and gait are normal. Plantar reflexes are down-going bilaterally. DTRs are symmetrical throughout. Sensory, motor and coordinative functions appear intact. Skin:  Aging changes Psych:  Cognition and judgment appear intact. Alert and cooperative with normal attention span and concentration. No apparent delusions, illusions, hallucinations    Impression & Recommendations:  Problem # 1:  CORONARY ARTERY DISEASE (ICD-414.00) Assessment Unchanged  His updated medication list for this problem includes:    Lisinopril 20 Mg Tabs (Lisinopril) .[name]... 1 tablet by mouth bid    Plavix 75 Mg Tabs (Clopidogrel bisulfate) .[name]... Take 1 tab by mouth daily    Carvedilol 6.25 Mg Tabs (Carvedilol) .[name]... 1 tab by mouth two times daily    Aspirin 81 Mg Tbec (Aspirin) ..... One by mouth every day    Nitroglycerin 0.4 Mg Subl (Nitroglycerin) ..... One tablet under tongue every 5 minutes as needed for chest pain---may repeat times three  Problem # 2:  HYPERTENSION (ICD-401.9) Assessment: Unchanged  His updated medication list for this problem includes:    Lisinopril 20 Mg Tabs (Lisinopril) .[name]... 1 tablet by mouth bid    Carvedilol 6.25 Mg Tabs (Carvedilol) .[name]... 1 tab by mouth two times daily  BP today: 116/60 Prior BP: 122/57 (05/06/2008)  Labs Reviewed: K+: 4.8 (04/14/2007) Creat: : 1.1 (04/14/2007)   Chol: 126 (11/27/2007)   HDL: 61.7 (11/27/2007)   LDL: 54 (11/27/2007)   TG: 54 (11/27/2007)  Problem # 3:  HYPERLIPIDEMIA (ICD-272.4) Assessment: Comment Only  His updated medication list for this problem includes:    Simvastatin 80 Mg Tabs (Simvastatin) .[name]... 1 by mouth at bedtime  Problem # 4:  CONGESTIVE HEART FAILURE, UNSPECIFIED (ICD-428.0) Assessment: Comment Only  His updated medication list for this problem includes:    Lisinopril 20 Mg Tabs (Lisinopril) .[name]... 1 tablet by mouth bid    Plavix 75 Mg Tabs (Clopidogrel bisulfate) .[name]... Take 1 tab by mouth daily    Carvedilol 6.25 Mg Tabs (Carvedilol) .[name]... 1 tab by mouth two times daily    Aspirin 81 Mg Tbec (Aspirin) ..... One by mouth every day  Problem # 5:  DEGENERATIVE JOINT DISEASE (ICD-715.90) Assessment: Comment Only  His updated medication list for this problem includes:    Aspirin 81 Mg Tbec (Aspirin) ..... One by mouth every day  Complete Medication List: 1)   Lisinopril 20 Mg Tabs (Lisinopril) .[name].. 1 tablet by mouth bid 2)  Plavix 75 Mg Tabs (Clopidogrel bisulfate) .... Take 1 tab by mouth daily 3)  Carvedilol 6.25 Mg Tabs (Carvedilol) .[name].. 1 tab by mouth two times daily 4)  Simvastatin 80 Mg Tabs (Simvastatin) .[name].. 1 by mouth at bedtime 5)  Aspirin 81 Mg Tbec (Aspirin) .... One by mouth every day 6)  Vitamin D3 1000 Unit Tabs (Cholecalciferol) .[name].. 1 by mouth daily 7)  B Complex Tabs (B complex vitamins) .[name].. 1 by mouth qd 8)  Meclizine Hcl 12.5 Mg Tabs (Meclizine hcl) .[name].. 1-2 tab qid  as needed 9)  Nitroglycerin 0.4 Mg Subl (Nitroglycerin) .... One tablet under tongue every 5 minutes as needed for chest pain---may repeat times three 10)  Glucosamine Triple Strength  .... Take 1 tablet by mouth two times a day  Other Orders: Pneumococcal Vaccine (27253) Admin  1st Vaccine (84132)  Patient Instructions: 1)  Please schedule a follow-up appointment in 6 months. 2)  Labs next week: 3)  BMP prior to visit, ICD-9: 414.8 995.20  4)  Hepatic Panel prior to visit, ICD-9: 5)  Lipid Panel prior to visit, ICD-9: 6)  TSH prior to visit, ICD-9: 7)  CBC w/ Diff prior to visit, ICD-9: 8)  Urine-dip prior to visit, ICD-9:   Pneumovax Vaccine    Vaccine Type: Pneumovax    Site: left deltoid    Mfr: Merck    Dose: 0.5 ml    Route: IM    Given by: Tora Perches    Exp. Date: 03/08/2009    Lot #: [id number]    VIS given: 08/30/95 version given Jun 12, 2008.

## 2010-03-05 NOTE — Assessment & Plan Note (Signed)
Summary: 4 mo rov /nws  #   Vital Signs:  Patient profile:   77 year old male Height:      67 inches Weight:      164 pounds BMI:     25.78 Temp:     98.3 degrees F oral Pulse rate:   64 / minute Pulse rhythm:   regular Resp:     16 per minute BP sitting:   138 / 80  (left arm) Cuff size:   regular  Vitals Entered By: Lanier Prude, CMA(AAMA) (February 23, 2010 11:01 AM) CC: 4 mo f/u  Is Patient Diabetic? No   Primary Care Provider:  Tresa Garter MD  CC:  4 mo f/u .  History of Present Illness: The patient presents for a follow up of hypertension, diabetes, hyperlipidemia   Current Medications (verified): 1)  Lisinopril 20 Mg Tabs (Lisinopril) .... 1/2  Tablet By Mouth Two Times A Day 2)  Plavix 75 Mg Tabs (Clopidogrel Bisulfate) .... Take 1 Tab By Mouth Daily 3)  Carvedilol 6.25 Mg  Tabs (Carvedilol) .[name].. 1 Tab By Mouth Two Times Daily 4)  Simvastatin 80 Mg Tabs (Simvastatin) .[name].. 1 By Mouth At Bedtime 5)  Aspirin 81 Mg  Tbec (Aspirin) .... One By Mouth Every Day 6)  Vitamin D3 1000 Unit  Tabs (Cholecalciferol) .[name].. 1 By Mouth Daily 7)  B Complex   Tabs (B Complex Vitamins) .[name].. 1 By Mouth Qd 8)  Nitroglycerin 0.4 Mg Subl (Nitroglycerin) .... One Tablet Under Tongue Every 5 Minutes As Needed For Chest Pain---May Repeat Times Three 9)  Glucosamine Maximum Strength 1500 Mg Tabs (Glucosamine Hcl) .[name].. 1 Tab Two Times A Day  Allergies (verified): 1)  ! Pcn  Past History:  Past Medical History: Last updated: 06/12/2008 CORONARY ARTERY DISEASE (ICD-414.00) HYPERTENSION (ICD-401.9) HYPERLIPIDEMIA (ICD-272.4) BENIGN PROSTATIC HYPERTROPHY (ICD-600.00) DR Logan Bores THROMBOCYTOPENIA (ICD-287.5) DEGENERATIVE JOINT DISEASE (ICD-715.90) VERTIGO (ICD-780.4)  BPV 2010 Poss. [facility]  Social History: Last updated: 04/29/2008 Retired Married Regular exercise-yes Tobacco Use - No.  Alcohol Use - no Drug Use - no  Review of Systems  The patient denies anorexia,  weight loss, weight gain, chest pain, syncope, dyspnea on exertion, and abdominal pain.    Physical Exam  General:  General: Well developed, well nourished, NAD HEENT: OP clear, mucus membranes moist SKIN: warm, dry Psychiatric: Mood and affect normal Neck: No JVD, no carotid bruits, no thyromegaly, no lymphadenopathy. Lungs:Clear bilaterally, no wheezes, rhonci, crackles CV: RRR no murmurs, gallops rubs Abdomen: soft, NT, ND, BS present Extremities: No edema, pulses 1+ bilateral PT/DP.  Mouth:  Oral mucosa and oropharynx without lesions or exudates.  Teeth in good repair. Neck:  No deformities, masses, or tenderness noted. Lungs:  Normal respiratory effort, chest expands symmetrically. Lungs are clear to auscultation, no crackles or wheezes. Heart:  RRR Abdomen:  Bowel sounds positive,abdomen soft and non-tender without masses, organomegaly or hernias noted. Msk:  No deformity or scoliosis noted of thoracic or lumbar spine.   Neurologic:  No cranial nerve deficits noted. Station and gait are normal. Plantar reflexes are down-going bilaterally. DTRs are symmetrical throughout. Sensory, motor and coordinative functions appear intact. Skin:  L inner ankle ulcer 0.6 cm scabbed Psych:  Cognition and judgment appear intact. Alert and cooperative with normal attention span and concentration. No apparent delusions, illusions, hallucinationsnot anxious appearing and not depressed appearing.     Impression & Recommendations:  Problem # 1:  HYPERTENSION (ICD-401.9) Assessment Unchanged  His updated medication list for  this problem includes:    Lisinopril 20 Mg Tabs (Lisinopril) .[name]... 1/2  tablet by mouth two times a day    Carvedilol 6.25 Mg Tabs (Carvedilol) .[name]... 1 tab by mouth two times daily  BP today: 138/80 Prior BP: 132/72 (11/19/2009)  Labs Reviewed: K+: 4.5 (02/17/2010) Creat: : 1.1 (02/17/2010)   Chol: 127 (10/15/2009)   HDL: 61.00 (10/15/2009)   LDL: 57 (10/15/2009)   TG: 47.0  (10/15/2009)  Problem # 2:  CORONARY ARTERY DISEASE (ICD-414.00) Assessment: Unchanged  His updated medication list for this problem includes:    Lisinopril 20 Mg Tabs (Lisinopril) .[name]... 1/2  tablet by mouth two times a day    Plavix 75 Mg Tabs (Clopidogrel bisulfate) .[name]... Take 1 tab by mouth daily    Carvedilol 6.25 Mg Tabs (Carvedilol) .[name]... 1 tab by mouth two times daily    Aspirin 81 Mg Tbec (Aspirin) ..... One by mouth every day    Nitroglycerin 0.4 Mg Subl (Nitroglycerin) ..... One tablet under tongue every 5 minutes as needed for chest pain---may repeat times three  Problem # 3:  PSA, INCREASED (ICD-790.93) Assessment: Comment Only  Problem # 4:  HYPERGLYCEMIA (ICD-790.29) Assessment: Unchanged The labs were reviewed with the patient.   Problem # 5:  NEOPLASM OF UNCERTAIN BEHAVIOR OF SKIN (ICD-238.2) L ankle Assessment: New  Orders: Dermatology Referral (Derma)  Complete Medication List: 1)  Lisinopril 20 Mg Tabs (Lisinopril) .... 1/2  tablet by mouth two times a day 2)  Plavix 75 Mg Tabs (Clopidogrel bisulfate) .... Take 1 tab by mouth daily 3)  Carvedilol 6.25 Mg Tabs (Carvedilol) .[name].. 1 tab by mouth two times daily 4)  Simvastatin 80 Mg Tabs (Simvastatin) .[name].. 1 by mouth at bedtime 5)  Aspirin 81 Mg Tbec (Aspirin) .... One by mouth every day 6)  Vitamin D3 1000 Unit Tabs (Cholecalciferol) .[name].. 1 by mouth daily 7)  B Complex Tabs (B complex vitamins) .[name].. 1 by mouth qd 8)  Nitroglycerin 0.4 Mg Subl (Nitroglycerin) .... One tablet under tongue every 5 minutes as needed for chest pain---may repeat times three 9)  Glucosamine Maximum Strength 1500 Mg Tabs (Glucosamine hcl) .[name].. 1 tab two times a day  Patient Instructions: 1)  Please schedule a follow-up appointment in 3 months. 2)  BMP prior to visit, ICD-9: 3)  HbgA1C prior to visit, ICD-9:  790.79 4)  PSA 596.0   Orders Added: 1)  Dermatology Referral [Derma] 2)  Est. Patient Level IV [16109]

## 2010-03-05 NOTE — Progress Notes (Signed)
Summary: question on LISINOPRIL 20 MG TABS   Phone Note Call from Patient Call back at Home Phone [phone]   Caller: Spouse/ruth Reason for Call: Talk to Nurse Summary of Call: question on rx LISINOPRIL 20 MG TABS. Initial call taken by: Roe Coombs,  January 23, 2010 10:54 AM  Follow-up for Phone Call        I talked with Windell Moulding, Pt's wife. Mr. Matlack did not decrease lisinopril as ordered from last visit with Dr. Leatrice Jewels in October.  [facility]c of order.  She checked his bp just now and it is 100/52.  He took 20 mg of Lisinopril this morning.  He will not take any lisinopril at bedtime.  He will start Lisinopril 10mg  bid  as previously ordered. I will call [facility]     Prescriptions: LISINOPRIL 20 MG TABS (LISINOPRIL) 1/2  tablet by mouth two times a day  #30 x 11   Entered by:   Lisabeth Devoid RN   Authorized by:   [name], MD   Signed by:   Lisabeth Devoid RN on 01/23/2010   Method used:   [facility]ically to        [facility] [address] [phone]* (retail)       [address]       [place], [place]  [address]       Ph: [phone] or [phone]       Fax: [phone]   RxID:   [id number]

## 2010-04-14 ENCOUNTER — Other Ambulatory Visit: Payer: Self-pay | Admitting: Dermatology

## 2010-05-12 ENCOUNTER — Other Ambulatory Visit: Payer: Self-pay | Admitting: Internal Medicine

## 2010-05-12 ENCOUNTER — Other Ambulatory Visit (INDEPENDENT_AMBULATORY_CARE_PROVIDER_SITE_OTHER): Payer: Medicare Other

## 2010-05-12 DIAGNOSIS — R7309 Other abnormal glucose: Secondary | ICD-10-CM

## 2010-05-12 DIAGNOSIS — N320 Bladder-neck obstruction: Secondary | ICD-10-CM

## 2010-05-12 DIAGNOSIS — R7881 Bacteremia: Secondary | ICD-10-CM

## 2010-05-12 LAB — BASIC METABOLIC PANEL
BUN: 19 mg/dL (ref 6–23)
CO2: 29 mEq/L (ref 19–32)
Calcium: 9.3 mg/dL (ref 8.4–10.5)
Chloride: 106 mEq/L (ref 96–112)
Creatinine, Ser: 1 mg/dL (ref 0.4–1.5)
GFR: 77.32 mL/min (ref >60.00)
Glucose, Bld: 108 mg/dL — ABNORMAL HIGH (ref 70–99)
Potassium: 5.1 mEq/L (ref 3.5–5.1)
Sodium: 142 mEq/L (ref 135–145)

## 2010-05-12 LAB — PSA: PSA: 4.21 ng/mL — ABNORMAL HIGH (ref 0.10–4.00)

## 2010-05-12 LAB — HEMOGLOBIN A1C: Hgb A1c MFr Bld: 6.4 % (ref 4.6–6.5)

## 2010-05-19 ENCOUNTER — Encounter: Payer: Self-pay | Admitting: Internal Medicine

## 2010-05-19 ENCOUNTER — Ambulatory Visit (INDEPENDENT_AMBULATORY_CARE_PROVIDER_SITE_OTHER): Payer: Medicare Other | Admitting: Internal Medicine

## 2010-05-19 ENCOUNTER — Ambulatory Visit: Payer: Self-pay | Admitting: Internal Medicine

## 2010-05-19 DIAGNOSIS — R7309 Other abnormal glucose: Secondary | ICD-10-CM

## 2010-05-19 DIAGNOSIS — I10 Essential (primary) hypertension: Secondary | ICD-10-CM

## 2010-05-19 DIAGNOSIS — R972 Elevated prostate specific antigen [PSA]: Secondary | ICD-10-CM

## 2010-05-19 DIAGNOSIS — I509 Heart failure, unspecified: Secondary | ICD-10-CM

## 2010-05-19 NOTE — Assessment & Plan Note (Signed)
Doing well on meds 

## 2010-05-19 NOTE — Assessment & Plan Note (Signed)
Labs were reviewed.

## 2010-05-19 NOTE — Progress Notes (Signed)
  Subjective:    Patient ID: [name], male    DOB: 1923/10/05, 77 y.o.   MRN: [id number]  HPI  The patient presents for a follow-up of  chronic hypertension, chronic dyslipidemia, CAD controlled with medicines AND DIET; f/u elev. PSA    Review of Systems  HENT: Negative for rhinorrhea and neck stiffness.   Respiratory: Negative for cough.   Cardiovascular: Negative for chest pain.  Genitourinary: Positive for urgency and decreased urine volume.  Musculoskeletal: Negative for back pain.  Neurological: Negative for tremors and syncope.  Psychiatric/Behavioral: Negative for confusion.       Objective:   Physical Exam  Constitutional: He is oriented to person, place, and time. He appears well-developed.  HENT:  Mouth/Throat: Oropharynx is clear and moist.  Eyes: Conjunctivae are normal. Pupils are equal, round, and reactive to light.  Neck: Normal range of motion. No JVD present. No thyromegaly present.  Cardiovascular: Normal rate, regular rhythm, normal heart sounds and intact distal pulses.  Exam reveals no gallop and no friction rub.   No murmur heard. Pulmonary/Chest: Effort normal and breath sounds normal. No respiratory distress. He has no wheezes. He has no rales. He exhibits no tenderness.  Abdominal: Soft. Bowel sounds are normal. He exhibits no distension and no mass. There is no tenderness. There is no rebound and no guarding.  Musculoskeletal: Normal range of motion. He exhibits no edema and no tenderness.  Lymphadenopathy:    He has no cervical adenopathy.  Neurological: He is alert and oriented to person, place, and time. He has normal reflexes. No cranial nerve deficit. He exhibits normal muscle tone. Coordination normal.  Skin: Skin is warm and dry. Rash noted. Erythema: SKs.  Psychiatric: He has a normal mood and affect. His behavior is normal. Judgment and thought content normal.        Lab Results  Component Value Date   WBC 7.0 04/15/2009   HGB 14.9  04/15/2009   HCT 44.7 04/15/2009   PLT 174.0 04/15/2009   CHOL 127 10/15/2009   TRIG 47.0 10/15/2009   HDL 61.00 10/15/2009   ALT 18 10/15/2009   AST 28 10/15/2009   NA 142 05/12/2010   K 5.1 05/12/2010   CL 106 05/12/2010   CREATININE 1.0 05/12/2010   BUN 19 05/12/2010   CO2 29 05/12/2010   TSH 4.60 04/15/2009   PSA 4.21* 05/12/2010   HGBA1C 6.4 05/12/2010     Assessment & Plan:  CONGESTIVE HEART FAILURE, UNSPECIFIED Doing well on meds  HYPERGLYCEMIA Labs were reviewed.  HYPERTENSION Cont Rx  PSA, INCREASED Will recheck next time

## 2010-05-19 NOTE — Assessment & Plan Note (Signed)
Will recheck next time 

## 2010-05-19 NOTE — Assessment & Plan Note (Signed)
Cont Rx 

## 2010-06-02 ENCOUNTER — Encounter: Payer: Self-pay | Admitting: Cardiovascular Disease

## 2010-06-02 ENCOUNTER — Ambulatory Visit (INDEPENDENT_AMBULATORY_CARE_PROVIDER_SITE_OTHER): Payer: Medicare Other | Admitting: Cardiovascular Disease

## 2010-06-02 VITALS — BP 117/58 | HR 53 | Resp 14 | Ht 68.0 in | Wt 163.0 lb

## 2010-06-02 DIAGNOSIS — E785 Hyperlipidemia, unspecified: Secondary | ICD-10-CM

## 2010-06-02 DIAGNOSIS — I10 Essential (primary) hypertension: Secondary | ICD-10-CM

## 2010-06-02 DIAGNOSIS — I2510 Atherosclerotic heart disease of native coronary artery without angina pectoris: Secondary | ICD-10-CM

## 2010-06-02 NOTE — Progress Notes (Signed)
History of Present Illness: 77 yo WM with h/o CAD s/p posterior inferior MI 2002 with placement of a bare metal stent in the circumflex artery and subsequent unstable angina August 2007 with placement of drug eluting stent in the mid LAD , also history of HTN, hyperlipidemia who returns today for routine cardiac folow up. He has been doing well. He continues to exercise on a daily basis. He has had no chest pain, shortness of breath, dizziness, palpitations, near syncope, syncope, orthopnea or lower ext edema. He has been taking all of his medications as prescribed.   He has been under alot of stress with his wife who is now confined to a wheelchair. She is disabled by back pain post back surgery. He has been playing golf once per week.   Most recent lipids in September 2011 with TC=127, HDL 61, LDL 57.   LFTs ok.   Past Medical History  Diagnosis Date  . Coronary atherosclerosis of unspecified type of vessel, native or graft   . Unspecified essential hypertension   . Other and unspecified hyperlipidemia   . Hypertrophy of prostate without urinary obstruction and other lower urinary tract symptoms (LUTS)   . Thrombocytopenia, unspecified   . Osteoarthrosis, unspecified whether generalized or localized, unspecified site   . Dizziness and giddiness     Past Surgical History  Procedure Date  . Colonoscopy   . Hip arthroplasty   . Transurethral resection of prostate     Current Outpatient Prescriptions  Medication Sig Dispense Refill  . aspirin 81 MG EC tablet Take 81 mg by mouth daily.        [name] b complex vitamins tablet Take 1 tablet by mouth daily.        . carvedilol (COREG) 6.25 MG tablet Take 6.25 mg by mouth 2 (two) times daily.        . Cholecalciferol 1000 UNITS tablet Take 1,000 Units by mouth daily.        . clopidogrel (PLAVIX) 75 MG tablet Take 75 mg by mouth daily.        . Glucosamine-Chondroit-Vit C-Mn (GLUCOSAMINE 1500 COMPLEX PO) Take by mouth 2 (two) times daily.        [name]  lisinopril (PRINIVIL,ZESTRIL) 20 MG tablet Take 20 mg by mouth 2 (two) times daily.        . nitroGLYCERIN (NITROSTAT) 0.4 MG SL tablet Place 0.4 mg under the tongue every 5 (five) minutes as needed. May repeat x 3       . simvastatin (ZOCOR) 80 MG tablet Take 80 mg by mouth at bedtime.          Allergies  Allergen Reactions  . Penicillins     History   Social History  . Marital Status: Married    Spouse Name: N/A    Number of Children: N/A  . Years of Education: N/A   Occupational History  . Retired    Social History Main Topics  . Smoking status: Never Smoker   . Smokeless tobacco: Not on file  . Alcohol Use: No  . Drug Use: No  . Sexually Active: Not on file   Other Topics Concern  . Not on file   Social History Narrative   Regular Exercise -  YES    Family History  Problem Relation Age of Onset  . Coronary artery disease Other     Review of Systems:  As stated in the HPI and otherwise negative.   BP 117/58  Pulse 53  Resp 14  Ht 5\' 8"  (1.727 m)  Wt 163 lb (73.936 kg)  BMI 24.78 kg/m2  Physical Examination: General: Well developed, well nourished, NAD HEENT: OP clear, mucus membranes moist SKIN: warm, dry. No rashes. Neuro: No focal deficits Musculoskeletal: Muscle strength 5/5 all ext Psychiatric: Mood and affect normal Neck: No JVD, no carotid bruits, no thyromegaly, no lymphadenopathy. Lungs:Clear bilaterally, no wheezes, rhonci, crackles Cardiovascular: Regular rate and rhythm. No murmurs, gallops or rubs. Abdomen:Soft. Bowel sounds present. Non-tender.  Extremities: No lower extremity edema. Pulses are 2 + in the bilateral DP/PT.  ZDG:UYQIH brady, rate 50 bpm. LAFB.

## 2010-06-02 NOTE — Assessment & Plan Note (Signed)
Stable. Continue ASA, Plavix, beta blocker, statin, Ace-inhibitor.

## 2010-06-02 NOTE — Assessment & Plan Note (Signed)
Lipids well controlled. NO changes.

## 2010-06-02 NOTE — Assessment & Plan Note (Signed)
BP well controlled. No changes.  

## 2010-06-02 NOTE — Patient Instructions (Signed)
Your physician recommends that you schedule a follow-up appointment in: 6 months with Dr. Clifton James

## 2010-06-16 NOTE — Assessment & Plan Note (Signed)
[name]                            CARDIOLOGY OFFICE NOTE   VENSON, [name]                       [name]:          [id number]  DATE:11/11/2006                            DOB:          [birth year]    PRIMARY CARE PHYSICIAN:  [name], M.D.   REASON FOR VISIT:  Cardiac followup.   HISTORY OF PRESENT ILLNESS:  [name] was last seen by Dr. Samule Ohm in  April.  He has a history of inferior/posterior myocardial infarction in  2002, treated with bare metal stent placement to the circumflex.  He has  undergone subsequent intervention with a drug-eluting stent to the mid  left anterior descending and has mildly reduced ejection fraction of  45%.  He is doing very well without any significant angina or limiting-  dyspnea.  He exercises 5 days a week.  His electrocardiogram shows a  sinus bradycardia with a left anterior fascicular block and no marked  changes compared to the prior tracing.  Lipids are followed by Dr.  Posey Rea.  I reviewed the patient's medications today.  I note that he  is also due for a followup carotid duplex scan.   ALLERGIES:  PENICILLIN.   PRESENT MEDICATIONS:  1. Lisinopril 20 mg p.o. b.i.d.  2. Plavix 75 mg p.o. daily.  3. Glucosamine 1 b.i.d.  4. Zocor 80 mg p.o. daily.  5. Vitamin B6.  6. Vitamin B12.  7. Aspirin 325 mg p.o. daily.  8. Coreg 6.25 mg p.o. b.i.d.   REVIEW OF SYSTEMS:  As described in the history of present illness.  No  palpitations or syncope.   PHYSICAL EXAMINATION:  VITAL SIGNS:  Blood pressure today 130/69, heart  rate 54, weight 170 pounds.  GENERAL:  The patient is comfortable and in no acute distress.  NECK:  Reveals no loud bruits.  LUNGS:  Clear without labored breathing.  CARDIAC:  Reveals a regular rate and rhythm.  No S3 gallop.  EXTREMITIES:  Show no pitting edema.   IMPRESSION/RECOMMENDATIONS:  1. Coronary artery disease, status post previous inferior posterior  myocardial infarction.  The patient has a bare metal stent in the      circumflex and a drug-eluting stent in the left anterior      descending.  He is symptomatically quite stable and we will      continue medical therapy with followup over the next 6 months.  2. The patient is due for carotid duplex scanning.  He has a history      of 40-59% stenosis on the right and 0-39% stenosis on the left as      of November 2007.     Jonelle Sidle, MD  Electronically Signed    SGM/MedQ  DD: 11/11/2006  DT: 11/11/2006  Job #: [id number]   cc:   Ricky Quint. Plotnikov, MD

## 2010-06-16 NOTE — Assessment & Plan Note (Signed)
[facility]                            CARDIOLOGY OFFICE NOTE   [name], [name]                       MRN:          [id number]  DATE:05/26/2007                            DOB:          1923/03/14    PRIMARY CARE PHYSICIAN:  Dr. Macarthur Critchley Plotnikov.   REASON FOR VISIT:  Cardiology followup.   HISTORY OF PRESENT ILLNESS:  [name] continues to do very well.  He  is not reporting any significant angina or limiting dyspnea on exertion.  Today's electrocardiogram shows sinus bradycardia with an old left  anterior fascicular block and nonspecific ST-T wave changes.  Medications are outlined below.  He states that one of his neighbors is  a Engineer, civil (consulting) and that she checks his blood pressure stating that it is  typically good.  Today I rechecked his blood pressure and found it to  be 128/82.  He exercises 5 days a week and has a very reasonable  regimen.  He told me, for instance, that his typical workout recently  has been walking on the treadmill at 4% grade at a speed of 2.8 miles  per hour for 30 minutes.  With this level of activity he has no  symptoms.   ALLERGIES:  PENICILLIN.   MEDICATIONS:  1. Lisinopril 20 mg p.o. b.i.d.  2. Plavix 75 mg p.o. daily.  3. Glucosamine b.i.d.  4. Zocor 80 mg p.o. nightly.  5. Aspirin 325 mg p.o. daily.  6. Coreg 6.25 mg p.o. b.i.d.  7. Vitamin D.  8. Super B complex.  9. Sublingual nitroglycerin 0.4 mg p.r.n.  10.Ambien 10 mg nightly p.r.n.   REVIEW OF SYSTEMS:  As history present illness.  No claudication.  No  speech problems or focal weakness.  Last carotid ultrasound in November  2008 showed mild to moderate carotid plaque that was stable.   EXAMINATION:  Blood pressure 128/82, heart rate 60 weight 170 pounds.  The patient is comfortable in no acute distress.  Examination neck reveals no loud carotid bruits.  Lungs are clear without labored breathing.  CARDIAC:  Exam reveals a regular rate and rhythm.   Soft apical systolic  murmur.  No S3 gallop.  ABDOMEN:  Soft, nontender, normoactive bowel sounds.  EXTREMITIES:  No pitting edema.   IMPRESSION/RECOMMENDATIONS:  1. Coronary artery disease status post previous inferior posterior      myocardial infarction.  The patient is status post bare metal stent      to the circumflex and subsequent drug-eluting stent to the left      anterior descending.  He is doing quite well.  His last Cardiolite      was in 2005.  We did talk about the possibility of a followup      screening Myoview given the length of time since this assessment,      although I would be hard-pressed to justify this based on his      present functional status and lack of symptoms.  We both felt like      observation was the best course at this time as he is  on a      reasonable medical regimen.  He will let me know if he develops any      symptoms in the interim.  We will plan to see him back in 6 months.  2. Hypertension, well-controlled today.  3. Mild to moderate carotid artery disease with Doppler in November      2000 a showing 40-59% stenosis on the right and 0-39% stenosis on      left.  4. Hyperlipidemia, followed by Dr. Posey Rea.  Goal LDL should be      around 70.     Ricky Sidle, [name]  Electronically Signed    SGM/MedQ  DD: 05/26/2007  DT: 05/26/2007  Job #: [id number]   cc:   Georgina Quint. Plotnikov, [name]

## 2010-06-16 NOTE — Assessment & Plan Note (Signed)
[facility]                            CARDIOLOGY OFFICE NOTE   [name], [name]                       MRN:          [id number]  DATE:11/16/2007                            DOB:          Jan 12, 1924    PRIMARY CARE PHYSICIAN:  Georgina Quint. Plotnikov, MD   REASON FOR VISIT:  Cardiology followup.   HISTORY OF PRESENT ILLNESS:  [name] is a pleasant 77 year old  Caucasian male with past medical history significant for coronary artery  disease status post posterior inferior myocardial infarction in 2002  with placement of a bare-metal stent in the circumflex artery and  subsequent unstable angina in August 2007, at which time, he had a drug-  eluting stent placed in the mid LAD.  He also has a history of  hypertension, hyperlipidemia and carotid artery disease and presents  today for his routine 30-month followup.  The patient was previously  followed by Dr. Simona Huh who no longer sees the patient in this  office.  The patient presents today to establish care with me.  He tells  me that he has been doing extremely well.  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  He has had no chest pain,  dyspnea with exertion, palpitations, dizziness, near syncope, syncope,  orthopnea, PND, or lower extremity edema.  He tells me that he has no  pain in his legs when he exercises.  He has never smoked and tells me  that he is very aware of his diet.  He has been taking all of his  medications as prescribed.   PAST MEDICAL HISTORY:  1. Coronary artery disease status post posterior inferior myocardial      infarction in 2002 with placement of a bare-metal stent in the      circumflex artery and subsequent unstable angina in August 2007      with placement of a drug-eluting stent in the mid LAD.  2. Hypertension.  3. Hyperlipidemia.  4. Carotid artery disease with most recent carotid Dopplers performed      in November 2008 showing  40-59% stenosis in the right internal      carotid artery and 0-39% stenosis in the left internal carotid      artery.  This was stable from the study 1 year prior.   PAST SURGICAL HISTORY:  None.   ALLERGIES:  PENICILLIN.   CURRENT MEDICATIONS:  1. Lisinopril 20 mg twice daily.  2. Plavix 75 mg once daily.  3. Glucosamine 1 tablet twice daily.  4. Zocor 80 mg once daily.  5. Aspirin 81 mg once daily.  6. Coreg 6.25 mg twice daily.  7. Vitamin D once daily.  8. Super B complex and C vitamins once daily.   SOCIAL HISTORY:  The patient is married and has 2 daughters, 2  grandchildren, and 4 great grandchildren.  He is a retired Company secretary.  He  denies the use of tobacco, alcohol, or illicit drugs.  He has never used  tobacco.   FAMILY HISTORY:  Noncontributory.   REVIEW OF SYSTEMS:  As stated in the history of present illness and is  otherwise negative.   PHYSICAL EXAMINATION:  VITALS:  Blood pressure 138/67, pulse 57 and  regular, respirations 12 and nonlabored.  GENERAL:  He is an elderly thin Caucasian male in no acute distress.  He  is alert and oriented x3.  PSYCHIATRIC:  Mood and affect are appropriate.  MUSCULOSKELETAL:  Muscle strength and tone are normal.  NEURO:  No focal neurological deficits.  SKIN:  Warm and dry.  NECK:  No JVD.  No lymphadenopathy.  No thyromegaly.  There is a faint  bruit heard over the right carotid artery.  There are no bruits over the  left carotid artery.  LUNGS:  Clear to auscultation bilaterally without wheezes, rhonchi, or  crackles noted.  CARDIOVASCULAR:  Regular rate and rhythm with a soft systolic murmur  heard at the apex.  No gallops or rubs are noted.  ABDOMEN:  Soft, nontender, nondistended.  Bowel sounds are present.  No  organomegaly noted.  No abdominal bruits noted.  EXTREMITIES:  No evidence of edema.  Pulses are 2+ in the dorsalis pedis  and posterior tibial arteries bilaterally.  Pulses are 2+ in the  bilateral radial  arteries.   DIAGNOSTIC STUDIES:  A 12-lead EKG obtained in our office today shows  sinus bradycardia with left axis deviation and nonspecific T-wave  abnormalities.  His EKG is unchanged when compared to an EKG from April  2009.   ASSESSMENT AND PLAN:  This is a pleasant 78 year old Caucasian male with  a past medical history significant for coronary artery disease status  post prior stenting in the circumflex and the LAD, hypertension,  hyperlipidemia, and carotid artery disease who presents for a routine  cardiology followup.  The patient has no signs or symptoms that are  suggestive of angina, arrhythmias, or congestive heart failure.  I would  like to continue all of his medications as currently written.  The  patient should remain on Plavix as long as he tolerates this medication.  I would like to obtain a 2-D surface echocardiogram to assess his left  ventricular function since there was a report that by left heart  catheterization in 2007, his ejection fraction was mildly decreased to  45%.  I would also like to obtain his yearly carotid Doppler studies.  The patient is to continue his current exercise plan and his current  diet which has been heart healthy recently.  I will plan on seeing him  back in the office in 6 months.  We will let him know the  results of his echocardiogram and carotid Doppler studies.  He is aware  that he should call our office if he has any change in his clinical  situation.     [name], MD  Electronically Signed    CM/MedQ  DD: 11/16/2007  DT: 11/17/2007  Job #: [id number]   cc:   Georgina Quint. Plotnikov, MD  Duke Salvia, MD, [facility]

## 2010-06-19 NOTE — Assessment & Plan Note (Signed)
[name]                            CARDIOLOGY OFFICE NOTE   [name], [name]                       MRN:          [id number]  DATE:05/20/2006                            DOB:          1924/01/10    HISTORY OF PRESENT ILLNESS:  [name] is an 77 year old gentleman who  suffered a large inferoposterior myocardial infarction in 2002.  He  received a bare-metal stent to the circumflex at that time.  He then  represented in August 2007 with unstable angina.  I treated the severe  lesion of the mid LAD with rotablation and drug-eluting stent placement.  Since then, he has done very nicely.  His ejection fraction is 45%.   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  He also cares for his great grandchildren on a regular basis.  He has had no angina or exertional dyspnea.  He has had no palpitations,  syncope, presyncope, or claudication.   His current medications are:  1. Lisinopril 20 mg twice daily.  2. Plavix 75 mg daily.  3. Glucosamine.  4. Zocor 80 mg daily.  5. Vitamin B6.  6. Vitamin B12.  7. Aspirin 325 mg daily.  8. Coreg 6.25 mg twice daily.   PHYSICAL EXAMINATION:  He is generally well appearing, in no distress.  Heart rate 46.  Blood pressure 120/70.  Weight of 172 pounds.  He has no jugular venous distention, thyromegaly, or lymphadenopathy.  LUNGS:  Clear to auscultation.  Respiratory effort is normal.  He has a nondisplaced point of maximal cardiac impulse.  There is a  regular rate and rhythm without murmur, rub, or gallop.  ABDOMEN:  Soft, non-distended, non-tender.  There is no  hepatosplenomegaly.  There is no pulsatile midline mass, and no  abdominal bruit.  Bowel sounds are normal.  EXTREMITIES:  Warm without clubbing, cyanosis, edema, or ulceration.   Electrocardiogram today demonstrates normal sinus rhythm with left  anterior fascicular block, and old posteroinferior myocardial  infarction.   Laboratory studies date December 22, 2005 remarkable for a creatinine of  1.2, and potassium 4.7.   IMPRESSION/RECOMMENDATIONS:  1. Coronary disease:  Doing nicely after inferoposterior myocardial      infarction with bare-metal stenting in the circumflex, and drug-      eluting stenting of the left anterior descending.  Continue aspirin      and Plavix indefinitely.  Low heart rate procludes increasing the      Coreg.  Continue ACE inhibitor.  2. Mild systolic dysfunction:  Continue ACE inhibitor and beta      blocker.  3. Hypercholesterolemia:  Continue Zocor.  Lipids are managed by Dr.      Posey Riddle.  Goal LDL less than 70.  4. Carotid stenoses:  40% to 59% on the right, and zero to 39% on the      left as of November 2007.  Due for repeat study in November 2008.   Patient will follow up with Dr. Diona Riddle, who he has previously met in  the hospital.  Ricky Farber, MD  Electronically Signed    WED/MedQ  DD: 05/20/2006  DT: 05/20/2006  Job #: [id number]   cc:   Ricky Quint. Plotnikov, MD

## 2010-06-19 NOTE — Discharge Summary (Signed)
[place]  Patient:    [name], [name] Visit Number: [id number] MRN: [id number]          Service Type: MED Location: [phone] Attending Physician:  Veneda Melter Dictated by:   Tereso Newcomer, P.A. Admit Date:  01/07/2001 Disc. Date: 01/19/01   CC:         Sonda Primes, M.D. [facility]  Casimiro Needle B. Wert, M.D. [facility]   Discharge Summary  DATE OF BIRTH:  1923/08/31  DISCHARGE DIAGNOSES:  1. Status post large inferolateral myocardial infarction complicated by    cardiogenic shock and ventricular tachycardia/ventricular fibrillation    arrest requiring cardioversion. 2. Congestive heart failure plus/minus acute lung injury/______ adult    respiratory distress syndrome related to #1. 3. Left ventricular dysfunction, peri-myocardial infarction, with an ejection    fraction of 30%, improving to 55-65% by 2-D echocardiogram six days post    myocardial infarction. 4. Rash this admission, ? drug reaction (Vasotec, Protonix, vancomycin all    discontinued secondary to this). 5. ______ Trial this admission, patient randomized on placebo arm. 6. Hyperglycemia secondary to steroid use, this admission. 7. Thrombocytopenia, HIT panel negative.  PROCEDURES PERFORMED THIS ADMISSION:  Cardiac catheterization and percutaneous coronary intervention by Dr. Veneda Melter on January 07, 2001 revealing left main mild disease; left anterior descending with 30% proximal, 70% after large second diagonal; left circumflex dominant, 100% occluded after first obtuse marginal, 50% first obtuse marginal/second obtuse marginal; three small pulmonary vein and posterior descending artery branches; right coronary artery -- two right ventricular marginals, 50% proximal and mid; ejection fraction 30%.  The patient underwent percutaneous transluminal coronary angioplasty/stenting of the atrioventricular circumflex.  HOSPITAL COURSE:  This 77 year old with virtually no past medical  history was sawing wood on the date of admission when he developed acute onset of chest pain.  It was heavy across his chest associated with shortness of breath but no nausea or diaphoresis.  He was brought to the emergency room via EMS.  His EKG revealed ST elevation in the inferior leads.  His initial chest x-ray showed cardiomegaly but no acute disease.  He was brought emergently to the cath lab.  He underwent the procedure named above.  During the case, he developed hypertension and bradycardia, requiring placement of a temporary RV pacemaker as well as IABP.  He also developed ventricular tachycardia and ventricular fibrillation requiring amiodarone and D-C cardioversion.  He became hypoxemic and hypotensive, complicating his cardiogenic shock, requiring intubation.  Post catheterization, 2-D echocardiogram was performed to rule out effusion and this showed no pericardial effusion noted.  Patient was returned to the coronary intensive care unit for further management.  Our critical care team was consulted to help with the management of the patients ventilator.  Patients temporary wire was discontinued on December 8th.  He remained on dopamine, Levophed, aspirin, Plavix and Integrilin and heparin. He was noted to have a high fever, at times up to 101 degrees.  There was a question of aspiration pneumonia.  He was placed on empiric antibiotics.  His platelet count was noted to drop to 106,000 by December 10th.  An HIT panel was sent for; this returned negative.  His heparin was turned off.  His Integrilin was also turned off.  His platelet count improved over the next several days and was as high as 403,000 at the time of discharge.  The patient continued on the ventilator for the next several days and on December 12th, he was  extubated but he developed acute CHF.  His chest x-ray revealed increasing edema.  He was kept on BiPAP for the remainder of the day.  His respiratory cultures did  grow out methicillin-sensitive Staph aureus.  Our pulmonary team felt this was probably insignificant.  His antibiotics were continued, however.  The patient was started on all the appropriate medications including beta blockers and ACE inhibitors.  The patients pulmonary status continued to develop to point towards SIRS/ALI/FPARDS.  The patient developed a rash on January 15, 2001.  He was given Lotrisone cream and his ACE inhibitor, Protonix and vancomycin were all discontinued.  He was also placed on steroids.  His rash improved significantly over the next few days.  It was questioned whether or not this was a drug reaction or related to something else.  The patient was seen by cardiac rehab.  They ambulated him over the next few days.  He had steady improvement without complaints of chest pain. His O2 saturations remained somewhat low on room air at 90%.  He was set up with home O2 and this will be used as needed.  Pulmonary saw the patient for the last time on December 18th.  They felt that the acute lung injury and rash were probably not directly related but the patient was still better on steroids.  Therefore, they recommended a steroid taper over the next five to seven days and they signed off on the the patient.  On the morning of December 19th, he was found to be in stable condition.  His vital signs were stable and he was ready for discharge to home.  LABORATORY DATA:  Prior to discharge, his sodium was 137, potassium 4.8, chloride 101, CO2 29, glucose 226, BUN 25, creatinine 0.9.  Last CBC checked was on January 16, 2001:  WBC 10,900, hemoglobin 13.5, hematocrit 40.8, platelet count 403,000.  Hemoglobin A1c on December 18th 6.2.  Erythrocyte sedimentation rate 81 on December 16th.  Heparin-associated antibodies negative.  On December 7th, AST 40, ALT 29, alkaline phosphatase 57, total bilirubin 1.1.  Peak total CK 4328, CK-MB 906.  BNP on December 16th 645.  Lipid profile:   Total cholesterol 191, triglycerides 71, HDL 53, LDL 123.  TSH 1.001.  Last chest x-ray on December 18th revealed cardiac silhouette remains borderline enlarged.  There was less patchy opacity in both upper lobes. There was interval development of a small area of increased density at the left lung base laterally.  There was no significant change in diffuse accentuation of the interstitial margins and thoracic spine showed degenerative changes.  The impression from the radiologist was improving bilateral upper lobe pneumonia, interval development of a small area of pneumonia, atelectasis with pulmonary infarction, lateral aspect of the left lower lung zone, stable chronic interstitial lung disease.  This chest x-ray was reviewed by pulmonary.  DISCHARGE MEDICATIONS: 1. Coated aspirin 325 mg q.d. 2. Plavix 75 mg q.d. 3. Lopressor 25 mg b.i.d. 4. Zocor 20 mg q.h.s. 5. Prednisone 20 mg two tablets twice daily for today, then two tablets once    daily for two days, then one tablet once daily for two days, then stop. 6. Nitroglycerin p.r.n. chest pain. 7. Lotrisone cream -- apply once daily to rash p.r.n.  ACTIVITY:  No driving, heavy lifting, exertional work or sex until seen by the doctor again.  DIET:  Low fat, low sodium.  SPECIAL DISCHARGE INSTRUCTIONS:  The patient is to call our office for any groin swelling, bleeding or bruising.  FOLLOWUP:  He is to follow up with the physician assistant for Dr. Chales Abrahams on January 7th at 11 a.m.  As noted above, he does have hypoglycemia probably related to steroid use this admission.  He probably should have a BMP checked fasting on his return to our office to make sure that his hyperglycemia has resolved. Dictated by:   Tereso Newcomer, P.A. Attending Physician:  Veneda Melter DD:  01/19/01 TD:  01/19/01 Job: [id number]

## 2010-06-19 NOTE — Cardiovascular Report (Signed)
[name]. [facility]  Patient:    [name], [name] Visit Number: [id number] MRN: [id number]          Service Type: MED Location: [facility]UA [id number] Attending Physician:  Veneda Melter Dictated by:   Veneda Melter, M.D. [facility] Proc. Date: 01/07/01 Admit Date:  01/07/2001                          Cardiac Catheterization  ADDENDUM:  A 2-D echocardiogram was performed at the termination of the case for assessment of the patients hypotension to rule out effusion.  This showed no pericardial effusion.  There was severe hypokinesis and akinesis of the inferior and posterior wall, with at least moderate LV dysfunction.  The anterior wall was functioning appropriately.  The RV was mildly dilated, but only mildly hypokinetic. Dictated by:   Veneda Melter, M.D. [facility] Attending Physician:  Veneda Melter DD:  01/07/01 TD:  01/08/01 Job: [id number]

## 2010-06-19 NOTE — H&P (Signed)
[name], Ricky NO.:  [id number]   MEDICAL RECORD NO.:  [id number]          PATIENT TYPE:  INP   LOCATION:  1428                         FACILITY:  [facility]   PHYSICIAN:  Jamison Neighbor, M.D.  DATE OF BIRTH:  [birth year]   DATE OF ADMISSION:  03/31/2005  DATE OF DISCHARGE:                                HISTORY & PHYSICAL   SERVICE:  Urology.   ADMISSION DIAGNOSIS:  Benign prostatic hypertrophy with bladder outlet  obstruction.   HISTORY:  This 77 year old male has been treated with oral therapy including  5-Alpha reductase inhibitors and herbal options and has not had any  improvement in his voiding symptoms. He underwent cystoscopy because he was  still having urgency and nocturia up to 4 times a night. He has had a  markedly trabeculated bladder with significant bladder injury. The patient  is to be admitted following TURP.   PAST MEDICAL HISTORY:  Remarkable for a past history of myocardial  infarction, in 2002 he had a stent placed. The patient has not used  nitroglycerin since that point. The patient's known to have BPH with bladder  outlet obstruction and the only real findings on his review of systems was  urgency and nocturia.   PAST SURGICAL HISTORY:  His previous surgery included right total hip  arthroplasty. He did have some postoperative retention following that  procedure and required prolonged catheterization.   SOCIAL HISTORY:  Unremarkable. He does not use tobacco or alcohol.   MEDICATIONS ON ADMISSION:  Plavix and aspirin which were discontinued.  Lisinopril, simvastatin and Proscar which will be discontinued  postoperatively after discharge. Vitamin B6,  glucosamine and chondroitin,  Saw palmetto, vitamin B12 and nitroglycerin which he has not taken in  several years.   PHYSICAL EXAMINATION:  GENERAL:  This is a well-developed, well-nourished  male who appears younger than his stated age.  VITAL SIGNS:  Temperature 97.4, pulse 68,  respirations 16, blood pressure  162/80.  HEENT:  Normocephalic, atraumatic. Cranial nerves II-XII were grossly  intact.  NECK:  Supple, no adenopathy or thyromegaly.  LUNGS:  Clear.  HEART:  Regular rate and rhythm, no murmurs, thrills, gallops, rubs or  heaves.  ABDOMEN:  Soft, nontender with no palpable masses, rebound or guarding.  EXTREMITIES:  Had no cyanosis, clubbing or edema.  NEUROLOGIC/VASCULAR:  Neurologic and vascular systems were intact.  GU:  Examination shows normal testicles bilaterally and unremarkable penis  with normal meatus.  RECTAL:  Shows a 2 to 3+ benign appearing prostate.   IMPRESSION:  Benign prostatic hypertrophy with bladder outlet obstruction.   PLAN:  Admit following TURP.           ______________________________  Jamison Neighbor, M.D.  Electronically Signed     RJE/MEDQ  D:  03/31/2005  T:  04/01/2005  Job:  [id number]   cc:   Georgina Quint. Plotnikov, M.D. [facility]  [address]  [place]  [place] [address]

## 2010-06-19 NOTE — H&P (Signed)
[name], [name]                ACCOUNT NO.:  [id number]   MEDICAL RECORD NO.:  [id number]          PATIENT TYPE:  INP   LOCATION:  NA                           [facility]ACILITY[facility]  [facility]   PHYSICIAN:  Georges Lynch. Gioffre, M.D.DATE OF BIRTH:  11-13-1923   DATE OF ADMISSION:  DATE OF DISCHARGE:                                HISTORY & PHYSICAL   HISTORY:  The patient has had right hip pain for the past several months.  He is starting to have increasing pain with ambulation.  He has known  degenerative arthritis in his right hip.  The patient has been trying to  prolong a total hip replacement as long as possible.  He has been taking  Celebrex which seemed to control his symptoms, but over the past few months  he notes that the Celebrex is no longer helping, he is having more pain with  ambulation, and elects to proceed with a right total hip arthroplasty.   ALLERGIES:  PENICILLIN.   PRIMARY CARE Tashona Calk:  Dr. Posey Rea.   CARDIOLOGIST:  Dr. Gerri Spore.   PAST MEDICAL HISTORY:  Significant for:  1.  MI in 2002.  2.  Urinary frequency.  3.  Degenerative arthritis.  4.  He did have surgery in 2002 for stent placement.   CURRENT MEDICATIONS:  1.  Plavix 75 mg daily.  2.  Celebrex 200 mg daily.  3.  Pyroxidone 50 mg daily.  4.  Glucosamine/chondroitin daily.  5.  Aspirin 81 mg one daily.  6.  Zocor 80 mg daily.  7.  Lisinopril 5 mg daily.   FAMILY HISTORY:  Significant for mother with coronary artery disease.   REVIEW OF SYSTEMS:  GENERAL:  Denies weight change, fever, chills, fatigue.  HEENT:  Denies headache, visual changes, tinnitus, hearing loss, sore  throat.  CARDIOVASCULAR:  Denies chest pain, palpitations, shortness of  breath, orthopnea.  PULMONARY:  Denies dyspnea, wheezing, cough, sputum  production, hemoptysis.  GI:  Denies dysphagia, nausea, vomiting,  hematemesis, or abdominal pain.  GU:  The patient does have some urinary  frequency.  MUSCULOSKELETAL:  The patient has  painful range of motion of  right hip and an antalgic gait.  NEUROLOGIC:  Denies dizziness, vertigo,  syncope, seizures.  SKIN:  Denies itching, rashes, masses, or moles.   PHYSICAL EXAMINATION:  VITAL SIGNS:  Temperature is 97.4, pulse is 68,  respirations 18, blood pressure 140/70.  GENERAL:  A 77 year old male, no acute distress.  HEENT:  PERRL, EOMs intact, pharynx clear.  NECK:  Supple without masses.  CHEST:  Clear to auscultation bilaterally.  No wheezing, rales, or rhonchi  noted.  HEART:  Regular rate and rhythm without murmur.  ABDOMEN:  Positive bowel sounds, soft, nontender.  MUSCULOSKELETAL:  Examination of his right hip reveals slightly decreased  leg length on the right when compared to the left.  He has marked limited  range of motion with pain.  SKIN:  Warm and dry.   X-ray shows degenerative arthritis of his right hip.   IMPRESSION:  Degenerative arthritis of right hip.   PLAN:  The  patient is to be admitted to [facility] on February 04, 2004 for a right total hip arthroplasty.      LKP/MEDQ  D:  01/31/2004  T:  01/31/2004  Job:  [id number]

## 2010-06-19 NOTE — Procedures (Signed)
[facility]  Patient:    CHIOKE, NOXON                       MRN: [id number] Proc. Date: 05/17/00 Adm. Date:  [id number] Attending:  Orland Mustard CC:         Sonda Primes, M.D. [facility]   Procedure Report  PROCEDURE:  Colonoscopy.  ENDOSCOPIST:  Llana Aliment. Edwards, M.D.  MEDICATIONS:  Fentanyl 75 mcg, Versed 8 mg IV.  SCOPE:  Adult Olympus video colonoscopy.  INDICATIONS:  Previous history of colon polyps.  This is done as a three-year followup.  DESCRIPTION OF PROCEDURE:  The procedure had been explained to the patient and consent obtained.  With the patient in the left lateral decubitus position, the Olympus adult video colonoscope was inserted and advanced under direct visualization.  The prep was excellent.  We were able to advance to the cecum without difficulty.  The terminal ileum was entered for a short distance.  it was normal.   The scope was withdrawn.  The cecum, ascending colon, hepatic flexure, transverse colon, splenic flexure, descending and sigmoid colon were seen well.  No polyps were seen, only scattered diverticula.  The scope was withdrawn.   The patient tolerated the procedure well. ASSESSMENT:  No evidence of any further colon polyps.  PLAN:  Will recommend repeating procedure in five years with yearly hemoccults by his family physician. DD:  05/17/00 TD:  05/17/00 Job: [id number]

## 2010-06-19 NOTE — Discharge Summary (Signed)
[name], [name]                ACCOUNT NO.:  [id number]   MEDICAL RECORD NO.:  [id number]          PATIENT TYPE:  INP   LOCATION:  0471                         FACILITY:  [facility]   PHYSICIAN:  [name], Ricky RiddleDATE OF BIRTH:  04-Jan-1924   DATE OF ADMISSION:  02/04/2004  DATE OF DISCHARGE:  02/09/2004                                 DISCHARGE SUMMARY   ADMISSION DIAGNOSES:  1.  Degenerative arthritis of right hip.  2.  Coronary artery disease.  3.  Urinary frequency.   DISCHARGE DIAGNOSES:  1.  Degenerative arthritis right hip status post right total hip      arthroplasty.  2.  Coronary artery disease.  3.  Urinary frequency.   PROCEDURE:  The patient was taken to the operating room on February 04, 2004  to undergo a right total hip arthroplasty.  Surgeon [name]. [name], M.D.;  assistant [name], M.D.  The surgery was performed under general  anesthesia.   CONSULTATIONS:  PT, OT.   BRIEF HISTORY:  This patient is a 77 year old male whose had right hip pain  for several months, starting to have increasing pain with ambulation. The  patient has been followed by Dr. Darrelyn Riddle for known degenerative arthritis in  his right hip. The patient has had try to prolong total hip replacement as  long as possible.  He has been using Celebrex which controlled his symptoms  for a time. Over the past few months, his Celebrex is no longer helping.  He  is having increasing pain with ambulation and elects to proceed with a right  total hip arthroplasty and was admitted to the hospital for same.   LABORATORY DATA:  Admission CBC, WBC 11.1 slightly elevated, RBC 4.80,  hemoglobin 14.8, hematocrit 43.9, platelet count 200.  Admission PT 12.9,  INR 1.0, PTT 27. Admission chemistries, sodium 140, potassium 4.9, chloride  105, CO2 29, glucose 80, BUN 17, creatinine 1.1, calcium 9.4, total protein  7.2, albumin 3.8.  Admission urinalysis showed trace leukocyte esterase  otherwise  negative. The patient's blood type is O positive, negative  antibody screen.   Preoperative x-ray of right hip showed marked right hip osteoarthritis with  possible avascular necrosis.  Postoperative x-ray of right hip, good  alignment of prosthesis.  I am unable to locate the patient's EKG or  preoperative chest x-ray on the medical record.   HOSPITAL COURSE:  The patient was admitted to [facility], he was  taken to the operating room, he underwent the above stated procedure without  complication.  He tolerated the procedure well and was allowed to return to  the recovery room then to the orthopedic floor to continue his postoperative  care. The patient was placed on PCA analgesia for pain control.  The  patient's pain was well controlled and he was weaned over to oral  analgesics.  The patient's PCA was discontinued on postoperative day three.  PT was consulted for gait training ambulation. The patient was able to  ambulate partial weightbearing with the aid of a walker.  The patient did  have  a problem with urinary retention. It was difficult for him to void so  the catheter had to be reinserted on postoperative day three.  The patient  was started on Flomax and the urinary retention resolved.  Postoperative day  five, the patient was doing well, pain was well controlled, he was ready to  be discharged home.   DISCHARGE MEDICATIONS:  1.  Percocet 1 or 2 every 6h. as needed for pain.  2.  Robaxin 1 every 6h. as needed for spasm.  3.  Coumadin 6 mg daily.   ACTIVITY:  Touchdown weightbearing with walker.   WOUND CARE:  The patient is to change the dressing daily.   FOLLOW UP:  The patient will followup with Dr. Darrelyn Riddle two weeks from the  date of surgery.  He will call the office and schedule the appointment.   DIET:  As tolerated.   Home care will be provided by [place].   CONDITION ON DISCHARGE:  Stable.      LKP/MEDQ  D:  03/11/2004  T:  03/11/2004  Job:   [id number]

## 2010-06-19 NOTE — Op Note (Signed)
NAMETAB, RYLEE NO.:  [id number]   MEDICAL RECORD NO.:  [id number]          PATIENT TYPE:  INP   LOCATION:  1428                         FACILITY:  [facility]   PHYSICIAN:  Jamison Neighbor, M.D.  DATE OF BIRTH:  12/08/1923   DATE OF PROCEDURE:  03/31/2005  DATE OF DISCHARGE:                                 OPERATIVE REPORT   SERVICE:  Urology.   PREOPERATIVE DIAGNOSIS:  Benign prostatic hypertrophy with bladder outlet  obstruction.   POSTOPERATIVE DIAGNOSIS:  Benign prostatic hypertrophy with bladder outlet  obstruction.   PROCEDURE:  Cystoscopy and TURP.   SURGEON:  Jamison Neighbor, M.D.   ANESTHESIA:  General.   COMPLICATIONS:  None.   DRAINS:  50 French three-way Foley catheter for continuous bladder  irrigation.   BRIEF HISTORY:  This 77 year old male has had problems with bladder outlet  obstruction and intermittent episodes of prostatitis for some time. The  patient has failed to respond well to oral therapy. He underwent cystoscopy  and a flow rate study. This showed marked trabeculation in the bladder and  extremely low flow rate well below the fifth percentile. Because the patient  has not responded well to oral therapy and has already shown significant  signs of bladder deterioration, he is to undergo TURP to open up his bladder  outlet and hopefully decrease some of his bladder outlet symptoms. He  understands the risks and benefits of the procedure including the fact that  he may have postoperative incontinence and may also have some postoperative  problems with the bladder due to the regulating nature of this organ. He  gave full informed consent.   DESCRIPTION OF PROCEDURE:  After successful induction of general anesthesia,  the patient was placed in the dorsal lithotomy position, prepped with  Betadine and draped in the usual sterile fashion. The urethra was dilated up  to 30 [place] with [employer]. The continuous flow  resectoscope sheath  was then inserted using a Timberlake obturator. Careful inspection of the  bladder showed no tumors or stones. It did show hypertrophy of the prostate.  The bladder was actually markedly trabeculated with diverticula throughout.  The prostate is a little bit smaller than one would expect because the  patient has been on Proscar but clearly there is evidence of clinical  bladder outlet obstruction. TURP was performed, the prostate was resected  beginning at the 6 o'clock position and extending out to the verumontanum.  This was followed by resection of the right lateral lobe down to the  surgical capsule and the left lateral lobe down to the capsule as well. A  little bit of anterior lobe tissue was resected as was the tissue at the  apex. The chips were all irrigated from the bladder, careful inspection  showed that the veru was intact, the sphincter was intact, the bladder neck  had not been undermined, the ureters had not been injured. Rectal  examination confirmed that most if not all the prostate tissue had been  resected. The bladder was drained and a 24 three-way Foley catheter was  inserted using a Timberlake obturator. This was placed to straight drainage  and irrigated freely. The patient tolerated the procedure well and was taken  to the recovery room in good condition.           ______________________________  Jamison Neighbor, M.D.  Electronically Signed     RJE/MEDQ  D:  03/31/2005  T:  04/01/2005  Job:  [id number]   cc:   Georgina Quint. Plotnikov, M.D. [facility]  [address]  [place]  [place] [address]

## 2010-06-19 NOTE — Assessment & Plan Note (Signed)
[facility]                             PRIMARY CARE OFFICE NOTE   [name], [name]                       MRN:          [id number]  DATE:12/08/2005                            DOB:          09-23-1923    Patient is an 77 year old male who presents for a wellness examination.   ALLERGIES:  PENICILLIN.   Past medical history, family history, social history as per October 09, 2001 note.  Had unstable angina and stent placed in August, 2007.  He  underwent TURP in 2006.   CURRENT MEDICATIONS:  Reviewed.   REVIEW OF SYSTEMS:  Goes to cardiac rehab three times a week.  Just had 60th  wedding anniversary.  Denies chest pain.  Occasional problems with  arthritis.  Waking up three times a night to go to bathroom.  The rest is  negative.   PHYSICAL EXAMINATION:  GENERAL:  He is in no acute distress.  VITAL SIGNS:  Blood pressure 112/66, pulse 52, temperature 98.  Weight 173  pounds.  HEENT:  Moist mucosa.  NECK:  Supple.  No thyromegaly or bruit.  LUNGS:  Clear.  No wheezes or rales.  HEART:  S1 and S2.  No murmur, no gallop.  ABDOMEN:  Soft, nontender.  No organomegaly.  EXTREMITIES:  Without edema.  NEUROLOGIC:  He is alert and appropriate.  He denies being depressed.  RECTAL:  Not done.  SKIN:  Large pigmented lesions on the trunk.   ASSESSMENT/PLAN:  1. Normal wellness examination:  Age/health-related issues discussed.      Healthy lifestyle discussed.  He has had a flu shot obtained.  Obtain      lab work appropriate for age.  A colonoscopy with Dr. Randa Evens was done      in August, 2007.  A rectal examination is done twice a month, either by      Dr. Randa Evens or Dr. Logan Bores.  EKG today reveals sinus bradycardia.  2. Benign prostatic hypertrophy, status post transurethral resection of      the prostate:  Followup with Dr. Logan Bores pending.  3. Insomnia:  Given Ambien 5-10 mg nightly.  He was asked to take his      lisinopril in the morning.  4. Peripheral vascular disease:  Carotid Doppler ultrasound yearly.  5. Large pigmented lesion to the skin:  We will schedule skin biopsy.    ______________________________  Georgina Quint Plotnikov, MD    AVP/MedQ  DD: 12/09/2005  DT: 12/09/2005  Job #: [id number]   cc:   Fayrene Fearing L. Malon Kindle., M.D.  Jamison Neighbor, M.D.

## 2010-06-19 NOTE — Assessment & Plan Note (Signed)
[facility]                              CARDIOLOGY OFFICE NOTE   ASHOK, SAWAYA                       MRN:          [id number]  DATE:11/12/2005                            DOB:          [birth year]    PRIMARY CARE PHYSICIAN:  Georgina Quint. Plotnikov, M.D.   HISTORY OF PRESENT ILLNESS:  Mr. [name] is an 77 year old gentleman who  suffered a large inferoposterior myocardial infarction in 2002, that was  treated with a bare metal stent to the circumflex.  He then presented in mid-  August 2007, with unstable angina.  He had a 95% stenosis of the mid LAD  which I treated with rotablation and drug-eluting stent placement.  He has  done very nicely since then.  Ejection fraction is 45%.  No recurrent  angina.  Tolerating cardiac rehab nicely.   CURRENT MEDICATIONS:  1. Plavix 75 mg per day.  2. Aspirin 325 mg per day.  3. Coreg 6.25 mg twice per day.  4. Vitamin B12.  5. Lisinopril 5 mg per day.  6. Vitamin B6.  7. Zocor 80 mg per day.   PHYSICAL EXAMINATION:  GENERAL:  He is generally well-appearing in no  distress.  VITAL SIGNS:  Heart rate 60, blood pressure 128/78, and weight of 171  pounds. Blood pressure has generally been running in the 140 systolic range  at cardiac rehab.  He has no jugular venous distention.  No thyromegaly.  LUNGS:  Clear to auscultation.  HEART:  He has a nondisplaced point of maximal cardiac impulse.  There is a  regular rate and rhythm without murmur, rub, or gallop.  ABDOMEN:  Soft, nondistended, nontender.  No hepatosplenomegaly.  No  pulsatile midline mass.  Bowel sounds are normal.  EXTREMITIES:  Warm without clubbing, cyanosis, edema, ulceration.   IMPRESSION/RECOMMENDATIONS:  1. Coronary disease.  Doing nicely after rotablation and a drug-eluting      stent placement in the left anterior descending artery.  Ejection      fraction 45%.  Continue aspirin and Plavix indefinitely.  Increase      Lisinopril to  10 mg per day.  We will plan on increasing it to 20 mg      per day if blood pressure remains good, if the pressures are not too      low.  Continue Coreg 6.25 mg twice a day.  2. Hypercholesterolemia.  Continue Zocor.  3. Carotid disease, moderate, bilaterally.  Due for repeat study in      November.       Salvadore Farber, MD     WED/MedQ  DD:  11/12/2005  DT:  11/14/2005  Job #:  [id number]   cc:   Georgina Quint. Plotnikov, MD

## 2010-06-19 NOTE — Assessment & Plan Note (Signed)
[facility]                           PRIMARY CARE OFFICE NOTE   MARBIN, OLSHEFSKI                       MRN:          [id number]  DATE:12/24/2005                            DOB:          November 18, 1923    PROCEDURE:  Skin biopsy.   INDICATIONS:  Large, inflamed, pigmented growth on the trunk.   Risks, including slow healing, scar formation, recurrent bleeding and  infection, and others were explained to the patient in detail.  He  agreed to proceed.  He was placed in decubitus position.   Lesion #1 at the base of his left neck, measuring 1.2 cm, was prepped  with Betadine and alcohol and injected with 2 cc of 2% lidocaine with  epinephrine, shave biopsy with DermaBlade and regular lancet was  performed.  The lesion was treated with Hyfrecator for the removal of  the residual lesion and hemostasis.  Telfa pad with antibiotic ointment  was applied.  Wound instructions were provided.  Tolerated well.  Complications none.   Lesion #2, measuring 2.5 by 2 cm, was prepped in identical fashion and  injected with 3 cc of 2% lidocaine with epinephrine.  The lesion was  removed in identical fashion.  Part of the specimen was sent to the lab.   Lesion #3, measuring 6.2 by 2 cm, was injected with 4 cc of 2% lidocaine  and removed in identical fashion.  Tolerated well.  Complications none.  Telfa pad with antibiotic ointment applied, wound instructions provided.   Mole #4, measuring 2 by 1 cm, above lesion #3, was removed in identical  fashion, following which specimen was sent to the lab.  Tolerated well.  Complications none.     Georgina Quint. Plotnikov, MD  Electronically Signed    AVP/MedQ  DD: 12/29/2005  DT: 12/29/2005  Job #: [id number]

## 2010-06-19 NOTE — H&P (Signed)
[name], Riddle                ACCOUNT NO.:  [id number]   MEDICAL RECORD NO.:  [id number]          PATIENT TYPE:  INP   LOCATION:  1830                         FACILITY:  [facility]   PHYSICIAN:  Ricky Pick. Nishan, [name],FACCDATE OF BIRTH:  24-Mar-1923   DATE OF ADMISSION:  09/22/2005  DATE OF DISCHARGE:                                HISTORY & PHYSICAL   PRIMARY CARE PHYSICIAN:  Dr. Posey Rea   PRIMARY CARDIOLOGIST:  Dr. Simona Huh   CHIEF COMPLAINT:  Chest pain.   HISTORY OF PRESENT ILLNESS:  [name] is an 77 year old white male with a  history of coronary artery disease.  He was driving to the gym this morning  which he does on a daily basis and at approximately 10:15 had onset of left-  sided chest pain.  This reached an 8/10 and was associated with some  shortness of breath, but no nausea, vomiting, or diaphoresis.  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  He denies any presyncope or dizziness.  He took a sublingual  nitroglycerin x1 and was assisted inside.  He was brought to the emergency  room and by arrival to the emergency room about 20 minutes later he was pain-  free.  He has had no recurrence of pain.   [name] has not had any other recent episodes of chest pain.  He thinks  this is like the pain proceeding his MI in 2002 but he is not sure because  it was so long ago and also because he was extremely ill at that time.  He  has noted no symptoms of increased fatigue or dyspnea on exertion with his  work-outs and is not aware of any other symptoms.   PAST MEDICAL HISTORY:  1. Status post inferolateral MI in 2002 associated with cardiogenic shock      VTVF arrest with direct current cardioversion and ventilator dependent      respiratory failure for five days.  2. Status post cardiac catheterization at the time of MI showing an  LAD      with a 40% a 70% stenosis, diagonal 50%, RCA 50% and EF 30%, dominant      circumflex was totaled and the stenosis was reduced to 0 with a bare      metal stent.  3. Mild left ventricular dysfunction with an EF by exercise Cardiolite in      2005 of 47%.  4. History of congestive heart failure at the time time of his MI.  5. Thrombocytopenia at the time of MI with the lowest platelet count noted      at 106, hep panel negative.  6. History of colon problems, but none seen on last colonoscopy.  7. Degenerative joint disease.  8. History of BPH.  9. Hyperlipidemia.  10.Mild hypertension.  11.Family history of coronary artery disease.   SURGICAL HISTORY:  1. Status post cardiac catheterization.  2. Colonoscopy  x2.  3. Right total hip replacement.  4. Last surgery was TURP in 2007.   ALLERGIES:  PENICILLIN.   MEDICATIONS:  1. Aspirin 81 mg a day.  2. Lisinopril 5 mg a day.  3. Plavix 75 mg a day.  4. Vitamins including B12 and B6  5. Zocor 80 mg a day.   SOCIAL HISTORY:  He lives in [place] with his wife and is a retired  IT sales professional.  He has never smoked and does not abuse alcohol or drugs.  He  exercises regularly.   FAMILY HISTORY:  His mother died at age 21 of an MI and had known coronary  artery disease.  His father died in a diving accident with a broken neck at  age 45.  He has a brother and sister both of whom have or had heart disease.   REVIEW OF SYSTEMS:  He denies any recent fevers, chills, or sweats.  He has  slight hearing loss.  He has seborrheic keratoses.  The chest pain is  described above.  His urinary problems resolved after the TURP.  He has  occasional arthralgias.  He denies reflux symptoms as well as hematemesis,  hemoptysis, or melena.  Review of systems is otherwise negative.   PHYSICAL EXAMINATION:  VITAL SIGNS:  Temperature is 96.3, blood pressure  initially 176/91, pulse 89, respiratory rate 24, O2 saturation 99% on 2 L.  GENERAL:   He is a well-developed, well-nourished, elderly white male in no  acute distress.  HEENT:  His head is normocephalic and atraumatic with pupils equal, round,  react to light accommodation.  Extraocular movements intact.  Nares without  discharge.  NECK:  There is no lymphadenopathy, thyromegaly or JVD noted.  He has  carotid bruits noted bilaterally, left greater than right.  CV:  His heart is regular in rate and rhythm with an S1 and S2 and no  significant murmur or gallop is noted.  His distal pulses are 2+ in all four  extremities and no femoral bruits are appreciated.  LUNGS:  Clear to auscultation bilaterally.  SKIN:  He has multiple seborrheic keratoses, all benign-appearing.  ABDOMEN:  Soft and nontender with active bowel sounds.  No  hepatosplenomegaly by palpation.  EXTREMITIES:  There is no cyanosis, clubbing, or edema noted.  MUSCULOSKELETAL:  He has got no joint deformity or effusions and no spine or  CVA tenderness.  NEURO:  He is alert and oriented.  Cranial nerves II-XII grossly intact.   Chest x-ray is pending.   EKG is sinus rhythm, rate 79, but no acute ischemic changes.   Laboratory values are pending at the time of dictation but point of care  markers are negative x1.   IMPRESSION:  1. Chest pain.  His symptoms are concerning for angina.  They started with      minimal activity and were relieved with sublingual nitroglycerin.  He      has not eaten since breakfast.  He will be admitted and cardiac enzymes      will be cycled.  Cardiac catheterization is scheduled this afternoon to      assess for progression of coronary artery disease.  2. Hyperlipidemia:  Continue Zocor and check a profile which has not been      done in almost a year.  3. Hypertension:  Increase lisinopril to 10 mg a day and add low-dose beta      blocker.  4. Vascular disease:  He has carotid bruits and patient reports  a carotid     ultrasound has been done in the past.  We will get those  results and      repeat them if they are not recent and if they are recently done just      have him follow up as an outpatient.     ______________________________  Theodore Demark, PA-C    ______________________________  Ricky Pick. Eden Emms, [name],[name]    RB/MEDQ  D:  09/22/2005  T:  09/22/2005  Job:  [id number]

## 2010-06-19 NOTE — Discharge Summary (Signed)
[name], [name]                ACCOUNT NO.:  [id number]   MEDICAL RECORD NO.:  [id number]          PATIENT TYPE:  INP   LOCATION:  6527                         FACILITY:  [facility]   PHYSICIAN:  Pricilla Riffle, MD, FACCDATE OF BIRTH:  [birth year]   DATE OF ADMISSION:  09/22/2005  DATE OF DISCHARGE:  09/23/2005                                 DISCHARGE SUMMARY   PROCEDURES:  1. Cardiac catheterization.  2. Coronary arteriogram.  3. Left ventriculogram.  4. PTCA and stent to one vessel.  5. Intravascular ultrasound.   TIME OF DISCHARGE:  33 minutes.   PRIMARY DIAGNOSIS:  Unstable anginal pain.   SECONDARY DIAGNOSES:  1. Inferior MI in 2005  with a bare metal stent to circumflex.  2. Cardiogenic shock and congestive heart failure in the setting of the      MI.  3. VT/VF arrest with direct current cardioversion as well as hypertension,      bradycardia requiring CPR secondary to MI.  4. Ventilator dependent respiratory failure times 5 days secondary to MI.  5. Thrombocytopenia with a platelet count that dropped to 106,000, HIP      panel negative  at the time of his MI.  6. History of colon polyps, but not seen on last colonoscopy.  7. Hypertension.  8. Hyperlipidemia.  9. Family history of coronary artery disease.  10.Degenerative joint disease.  11.History of BPH  12.Allergy or intolerance to penicillin.   TIME AT DISCHARGE:  38 minutes.   HOSPITAL COURSE:  [name] is an 77 year old male with known coronary  artery disease.  He exercises regularly and was in fact on his way to the  gym on the day of admission when he had sudden onset of left-sided chest  pain.  He reached an 8/10 and was associated with  shortness of breath but  no nausea, vomiting or diaphoresis.  He also developed weakness when  he  tried to walk and said that he sat down on the sidewalk to keep from  falling.  He was brought to the emergency room and pain free after  nitroglycerin.  It was felt that  he needed cardiac catheterization to define  his anatomy and he was taken to the cath lab.   The cardiac catheterization showed a nondominant RCA with 50% stenosis and  the previously placed circumflex stent was patent.  He has 40 and 50% distal  disease in the circumflex and 30% stenosis in the OM.  The LAD was heavily  calcified and he had a 95% mid stenosis with ruptured plaque.  There was a  70% stenosis in a diagonal branch but this was a small vessel.  He also had  a 40% stenosis in the LAD proximally and 30% stenosis in the diagonal.  His  EF was 45-50% with inferior hypokinesis.   Dr. Gala Romney consulted with Dr. Samule Ohm and they felt that percutaneous  intervention on the LAD was indicated.  Dr. Samule Ohm inserted the Taxus stent  to the LAD reducing the stenosis from 95% to zero.  The percutaneous  intervention to the LAD  was successful and he is to be on aspirin and Plavix  indefinitely.   On 09/23/2005 [name] had a minimal elevation in his  CK-MB but it is  less than two times normal and he is pain free.  His EKG is unchanged and  Dr. Tenny Craw felt that no further workup was indicated.   Dr. Tenny Craw evaluated [name]  and he was ambulating without chest pain or  shortness of breath.  [name] was initially a patient of Dr. Chales Abrahams and  then transferred to Dr. Rosina Lowenstein who also left.  He was then seen by Dr.  Diona Browner but has only seen Dr. Diona Browner once.  [name] feels that he  would rather work with a cardiologist that does the caths and interventions  and so Dr. Samule Ohm agreed to accept him as his patient.  He is to follow up  with Dr. Samule Ohm as an outpatient and was considered stable for discharge on  September 23, 2005.  Of note,  a lipid profile was ordered but had not been  done.  It is to be added on to blood in the lab and results will be  available by the time [name] comes back to see Dr. Samule Ohm.   DISCHARGE INSTRUCTIONS:  1. His activity level is to be increased  gradually.  He is not to drive      for 2 days and not to do any lifting for 2 weeks.  He is to call our      office for any problems with cath site.  He is to follow up with Dr.      Samule Ohm on October 14, 2005 at 2:15 p.m. and with Dr. Juanita Laster as      needed.   DISCHARGE MEDICATIONS:  1. Coated aspirin 325 mg q.d.  2. Plavix 75 mg q.d.  3. Nitroglycerin sublingual p.r.n.  4. Lisinopril 10 mg a day.  5. Toprol XL 25 mg daily.  6. Zocor 80 mg daily.  7. Multiple vitamins as prior to admission.  Copies to Dr.     ______________________________  Theodore Demark, PA-C    ______________________________  Pricilla Riffle, MD, [facility]    RB/MEDQ  D:  09/23/2005  T:  09/23/2005  Job:  [id number]   cc:   Georgina Quint. Plotnikov, MD

## 2010-06-19 NOTE — Assessment & Plan Note (Signed)
[facility]                              CARDIOLOGY OFFICE NOTE   DEVONTAYE, GROUND                       MRN:          [id number]  DATE:10/14/2005                            DOB:          Jan 20, 1924    HISTORY OF PRESENT ILLNESS:  Mr. Kassel is an 77 year old gentleman who  suffered a large inferior posterior myocardial infarction in 2002 that was  treated with bare metal stenting in a circumflex.  He then presented in mid  August of this year with unstable angina.  He had a 95% stenosis in the mid  LAD.  I treated him with rotablation and drug-eluting stent placement.  He  has done very nicely since then.  He is doing well at cardiac rehab.  He is  not having any angina, exertional dyspnea, PND, orthopnea, or edema.  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   CURRENT MEDICATIONS:  1. Plavix 75 mg daily.  2. Aspirin 325 mg daily.  3. Toprol XL 25 mg daily.  4. Vitamin B12.  5. Lisinopril 5 mg per day.  6. Vitamin B6.  7. Zocor 80 mg a day.   PHYSICAL EXAMINATION:  VITAL SIGNS:  Heart rate 55, blood pressure 132/80,  weight 169 pounds.  GENERAL:  He is generally well-appearing in no distress.  NECK:  He has no jugular venous distention and no thyromegaly.  LUNGS:  Clear to auscultation.  Respiratory effort is normal.  Lungs are  clear to auscultation.  HEART:  He has a nondisplaced point of maximal cardiac impulse.  He has a  regular rate and rhythm without murmur, rub or gallop.  ABDOMEN:  Soft, nondistended, nontender.  There is no hepatosplenomegaly.  Bowel sounds are normal.  EXTREMITIES:  Warm without clubbing, cyanosis, edema, or ulceration.  Right  femoral pulse is 2+ without bruit.   Electrocardiogram demonstrates sinus bradycardia at 55 beats per minute with  left anterior fascicular block and inferior myocardial infarction.   IMPRESSION/RECOMMENDATIONS:  1. Coronary disease:  Doing nicely after rotablation and  drug-eluting      stent placement in the LAD.  EF is approximately 45%.  Continue aspirin      and Plavix indefinitely.  Continue ACE inhibitor at present dose.      Switch metoprolol to Coreg 6.125 mg twice daily.  Follow up in one      month for consideration of up-titration of the ACE inhibitor.  2. Hypercholesterolemia:  Continue Zocor.  3. Carotid disease:  Moderate bilaterally.  Due for repeat study in      November.                                 Salvadore Farber, MD    WED/MedQ  DD:  10/14/2005  DT:  10/15/2005  Job #:  [id number]   cc:   Georgina Quint. Plotnikov, MD

## 2010-06-19 NOTE — Cardiovascular Report (Signed)
[name], [name]                ACCOUNT NO.:  [id number]   MEDICAL RECORD NO.:  [id number]          PATIENT TYPE:  INP   LOCATION:  6527                         FACILITY:  [facility]   PHYSICIAN:  Bevelyn Buckles. Bensimhon, MDDATE OF BIRTH:  1923-03-26   DATE OF PROCEDURE:  09/22/2005  DATE OF DISCHARGE:                              CARDIAC CATHETERIZATION   PRIMARY CARE PHYSICIAN:  Dr. Posey Rea   CARDIOLOGIST:  Dr. Simona Huh   PATIENT IDENTIFICATION:  [name] is a delightful 77 year old man with a  history of coronary artery disease status post an acute inferior MI in  December 2002 complicated by cardiogenic shock.  He is status post PTCA and  stenting of the proximal left circumflex.  He has been doing quite well,  remaining very active until today when he was getting ready to go exercise  and developed sudden onset of substernal chest pain reminiscent of his  previous MI.  It resolved after 15 minutes with some nitroglycerin.  First  set of cardiac markers and EKG were negative.   PROCEDURES PERFORMED:  1. Selective coronary angiography.  2. Left heart catheterization.  3. Left ventriculogram.   DESCRIPTION OF THE PROCEDURE:  The risks and benefits of the catheterization  were explained.  Consent was signed and placed on the chart.  A 6-French  arterial sheath was placed in the right femoral artery using a modified  Seldinger technique.  Standard catheters including a JL-4, JR-4, and angled  pigtail were used for procedure.  All catheter exchanges were made over a  wire.  There were no apparent complications.   Central aortic pressure 126/56, mean of 83.  LV pressure was 136/8 with an  EDP of 18.  There was no aortic stenosis.   Left main was short with some minor irregularities.   LAD was a long vessel coursing to the apex.  It gave off a single large  diagonal in the mid section.  The entire vessel was calcified and with  significant plaquing.  There was a 40% tubular  lesion throughout the  proximal portion.  In the mid section at the takeoff of the diagonal there  was heavily calcified 95% lesion with a question of a ruptured plaque.  In  the small upper branch of the diagonal there is 70% lesion and there is a  30% lesion in the mid portion of the diagonal.   Left circumflex is a very large dominant system.  It gave off two large OMs,  a small OM3.  There is a large first posterolateral, a small second  posterolateral, and a moderate-sized PDA.  There is a stent in the ostial  portion which was widely patent.  After the stent there was 40% focal  stenosis.  In the distal AV groove circ there is a 50% stenosis.  In the  proximal portion of the OM1 and OM2 there was a 30% lesion.   The right coronary artery was a nondominant vessel with moderate diffuse  disease.  There was a 50% lesion proximally and 50% lesion in the mid  section.  Left ventriculogram done in the RAO position showed an EF of 45-50% with  mild inferior hypokinesis.   ASSESSMENT:  1. Significant one-vessel coronary artery disease as described above with      a 95% mid left anterior descending lesion which is heavily calcified,      question of a ruptured plaque.  2. Left circumflex was dominant with a patent stent.  There is moderate      non-obstructive disease.  3. Mild left ventricular dysfunction.   PLAN/DISCUSSION:  We will proceed with a percutaneous intervention of the  mid LAD lesion with Dr. Samule Ohm.      Bevelyn Buckles. Bensimhon, MD  Electronically Signed     DRB/MEDQ  D:  09/22/2005  T:  09/23/2005  Job:  [id number]   cc:   Georgina Quint. Plotnikov, MD  Jonelle Sidle, MD

## 2010-06-19 NOTE — Op Note (Signed)
NAMESHYKEEM, RESURRECCION                ACCOUNT NO.:  [id number]   MEDICAL RECORD NO.:  [id number]          PATIENT TYPE:  INP   LOCATION:  NA                           [facility]ACILITY[facility]  [facility]   PHYSICIAN:  Georges Lynch. Gioffre, M.D.DATE OF BIRTH:  07-26-1923   DATE OF PROCEDURE:  02/04/2004  DATE OF DISCHARGE:                                 OPERATIVE REPORT   PREOPERATIVE DIAGNOSIS:  Severe degenerative arthritis of the right hip.   POSTOPERATIVE DIAGNOSIS:  Severe degenerative arthritis of the right hip.   OPERATION PERFORMED:  Right total hip arthroplasty utilizing the Osteonic  system, porous coated and I utilized the ceramic hip on him.  The sizes used  was a size 54 mm PSL Trident acetabular shell with two screws for fixation.  The insert was a 32 m diameter 0 degree ceramic insert.  The C-tapered head  was a ceramic C-tapered head 32 mm in diameter, +0.  The femoral component  was a size 4-1/2 Accolade femoral component, with an Accolade Plus  component.   SURGEON:  Georges Lynch. Darrelyn Hillock, M.D.   ASSISTANT:  Madlyn Frankel. Charlann Boxer, M.D.   ANESTHESIA:  General.   DESCRIPTION OF PROCEDURE:  Under general anesthesia with the patient on the  left side with the right side up, routine orthopedic prep and drape of the  right hip carried out.  He had 1 g of IV Ancef preop.  At this time an  incision was made over the posterolateral aspect of the right hip.  Bleeders  were identified and cauterized.  Self-retaining retractor were inserted.  I  identified the greater trochanter and made an incision over the iliotibial  band by sharp dissection and then by blunt dissection, I separated the  gluteal muscle  proximally.  Great care was taken to protect the sciatic  nerve.  Following this, I then went down to partially detach the external  rotators.  I detached the piriformis, isolated the capsule, did a posterior  capsulectomy.  I then dislocated the femoral head once again with great care  taken not to  injure the underlying sciatic nerve.  The head was dislocated  and the head was amputated at the appropriate neck level.  Following this,  we then utilized the box osteotome to remove a portion of the proximal  femoral neck and then utilized the canal finder followed by the Accolade  reamers.  We rasped on the Accolade rasp.  We rasped the femur down to a  size 4+.  Following this, we then prepared the acetabulum.  We completed our  posterior capsulectomy and made sure the rim was nice and free and we reamed  the acetabulum up to 54 mm diameter cup.  We then inserted our permanent PSL  Trident 54 mm cup and inserted two screws.  We went through trials with a  trial liner again.  Following this, we then inserted our permanent 32 mm  diameter ceramic insert.  We then inserted our permanent 4.5 Accolade  prosthesis and then utilized the +0 C-tapered 32 mm ceramic head.  We  then cleared the  acetabulum, reduced the hip.  The hip was taken through  motion, we had excellent stability.  We then reapproximated the soft tissue  structures in the usual fashion.  Skin was closed with metal staples.  Sterile Neosporin dressing was applied.      RAG/MEDQ  D:  02/04/2004  T:  02/04/2004  Job:  [id number]

## 2010-06-19 NOTE — Cardiovascular Report (Signed)
[facility]  Patient:    [name], [name] Visit Number: [id number] MRN: [id number]          Service Type: MED Location: [facility] [id number] Attending Physician:  Veneda Melter Dictated by:   Veneda Melter, M.D. [facility] Proc. Date: 01/07/01 Admit Date:  01/07/2001   CC:         Sonda Primes, M.D.                        Cardiac Catheterization  PROCEDURES PERFORMED: 1. Left heart catheterization. 2. Left ventriculogram. 3. Selective coronary angiography. 4. Placement of temporary RV pacemaker. 5. Placement of PA catheter. 6. Intra-aortic balloon pump. 7. Percutaneous transluminal coronary angioplasty and stenting of the AV circumflex. 8. A 2-D echocardiogram. 9. DC cardioversion.  DIAGNOSES: 1. Two vessel coronary artery disease. 2. Moderate left ventricular systolic dysfunction. 3. Acute inferolateral wall myocardial infarction. 4. Cardiogenic shock. 5. Ventricular tachycardia. 6. Ventricular fibrillation. 7. Sinus bradycardia. 8. Cardiac and pulmonary distress.  HISTORY OF PRESENT ILLNESS:  [name] is a 77 year old white male without prior cardiac history who presents with acute onset of substernal chest discomfort.  The patient was working in his shop and developed pain at about approximately 2:45 p.m.  He presented to [facility] at approximately 3:45, where he was found to have significant ST elevation in the inferior leads consistent with acute myocardial infarction.  The patient was brought urgently to the catheterization lab after informed consent was obtained.  The patient and wife were informed regarding participation in the [place] study, and they consented to participate.  He was randomized to placebo.  DESCRIPTION OF PROCEDURE:  Informed consent was obtained.  The patient was brought to the catheterization lab.  A 7 French sheath was placed in the right femoral artery, and a 6 French venous sheath was placed in the right  groin. Left heart catheterization, left ventriculogram, and selective coronary angiography were then performed in the usual fashion using preformed 6 French Judkins catheters.  Initial findings are as follows:  Left main trunk, large caliber vessel with mild irregularities.  LAD:  Medium caliber vessel that provides a trivial first diagonal branch, proximal segment, followed by a medium caliber second diagonal branch which trifurcates into the mid section. The LAD proper has calcification, mild disease at 30 to 40% of the proximal and mid sections.  There is then a high grade focal narrowing of 70% after the second diagonal branch with further narrowings of 30% distally.  The diagonal branches has moderate disease of 50%.  Left circumflex artery:  This is a large caliber vessel with a dominant.  It provides a first marginal branch in the proximal segment.  It is then 100% occluded.  A large second marginal branch will later be seen, as well as three posterior ventricular branches, and the posterior descending coronary artery and its distal segment.  No antegrade filling of the vessel is noted, only mild staining of the vessel with injection of the right coronary artery is seen.  Right coronary artery is non-dominant, medium caliber vessel prior to two RV marginal branches.  There is moderate disease of 50% of the right coronary system.  LV:  Dilated end systolic and end diastolic dimensions.  Overall left ventricular function is at least moderately impaired.  Ejection fraction is approximately 30%.  There is akinesis of the inferior and lateral walls.  No mitral regurgitation is noted.  LV pressure is 121/10,  aortic is 121/90, LVEDP is 15.  With these findings, we elected to proceed with percutaneous intervention of the left circumflex artery.  A 7 [place] for left guide cath was used to engage the left coronary artery, and a 0.014 inch floppy wire advanced with a 2.5 x 10 mm open sail  balloon.  The patient had previously received heparin.  He was given Integrilin on a weight adjusted basis, as well as Plavix orally.  The wire was used to engage the vessel, and crossed the occlusion with repeat angiography showing re-cannulization of the circumflex artery with severe narrowing of 80% between the first and second marginal branches.  There was TIMI 0 flow prior to passage of the wire and TIMI grade I flow following. Subsequently, the patient underwent balloon dilatation using 2.5 and 3.0 mm balloons.  This did result in a dissection between the first and second marginal branch requiring placement of a 4.0 x 8 mm Express II stent.  Repeat angiography then showed an excellent result with less than 10% residual narrowing, TIMI III flow through the vessel, and a good result.  During the case, the patient developed significant complications, including hypertension and bradycardia requiring placement of temporary RV pacemaker, as well as an intra-aortic balloon pump.  At the end of the case we also placed a PA catheter for hemodynamic monitoring.  He also developed ventricular tachycardia and ventricular fibrillation requiring IV Amiodarone, as well as DC cardioversion.  He developed hypoxemia and hypotension complicating his cardiogenic shock requiring intubation which was performed by the anesthesia service.  Levofed as well as dopamine drips were initiated.  The patient was also given Lopressor p.r.n. for control of heart rate.  At the termination of the case, he was stable with aortic pressure of 106/70, heart rate of 120, O2 saturations in the high 80% with augmentation of 120 with a mean of 106 mmHg. PA pressures initially were 25 systolic and were raised to 50 systolic at the end of the case.  At one point during the case, the patient did have significant hypertension and bradycardia requiring CPR.  However, at the end of the case the patient did have spontaneous movement  of all four limbs, and was interactive.  He was subsequently sedated completely, and transferred to the CCU.   FINAL RESULT:  Successful percutaneous intervention with stenting of the mid AV circumflex with reduction of 100% narrowing to less than 10% with placement of a 4.0 x 8 mm Express II stent.  PLAN:  [name] is a 77 year old gentleman who has suffered a large inferior posterior lateral wall myocardial infarction due to a dominant left circumflex system.He will be treated aggressively in the CCU.  He has residual disease in the mid left anterior descending artery for which attention may be necessary once he has recovered from this.  In addition, the first marginal branch with moderate disease of 50 to 60% appeared to be slightly more compromised at the end of the case, and may have been 70% diseased.  This also may require reassessment once the patient has been stabilized.  ADDENDUM:  A 2-D echocardiogram was performed at the termination of the case for assessment of the patients hypotension to rule out effusion.  This showed no pericardial effusion.  There was severe hypokinesis and akinesis of the inferior and posterior wall, with at least moderate LV dysfunction.  The anterior wall was functioning appropriately.  The RV was mildly dilated, but only mildly hypokinetic. Dictated by:  Veneda Melter, M.D. [facility] Attending Physician:  Veneda Melter DD:  01/07/01 TD:  01/08/01 Job: [id number]

## 2010-06-19 NOTE — Cardiovascular Report (Signed)
NAMETEVYN, CODD                ACCOUNT NO.:  [id number]   MEDICAL RECORD NO.:  [id number]          PATIENT TYPE:  INP   LOCATION:  6527                         FACILITY:  [facility]   PHYSICIAN:  Salvadore Farber, MD  DATE OF BIRTH:  Oct 05, 1923   DATE OF PROCEDURE:  DATE OF DISCHARGE:                              CARDIAC CATHETERIZATION   PROCEDURE:  Rotablation and drug-eluting stent placement in the mid-LAD,  intravascular ultrasound to the LAD, Angio-Seal closure of the right common  femoral arteriotomy site.   INDICATIONS:  Mr. Mark is an 77 year old gentleman, status post prior  stenting of the circumflex in the setting of inferoposterior myocardial  infarction in 2002.  He has generally done well since then.  He now presents  with unstable angina, having had an episode of crushing substernal chest  pain this morning.  Initial cardiac enzymes are normal.  He underwent  diagnostic angiography by Dr. Gala Romney.  That demonstrated 95% stenosis in  the mid-LAD just after the takeoff of a very large diagonal branch.  Vessel  is very heavily calcified.  We started to proceed to percutaneous  revascularization.   PROCEDURAL TECHNIQUE:  Informed consent had been obtained prior to the  diagnostic procedure and was confirmed orally with the patient prior to  proceeding to intervention.  Sheath was up-sized over a wire to 7 [place].  Anticoagulation was initiated bivalirudin.  ECT was confirmed to be greater  than 225 seconds.  A 7-French CLS 3.5 guide was advanced over a wire and  engaged in the ostium of the left main.  A pro-water wire was advanced to  the distal LAD without difficulty.  I then used a Maverick balloon to  exchange for a Rotafloppy wire via the over-the-wire balloon lumen.  I then  performed rotablation using a 1.5-mm burr for a total of 2 passes.  There  were no decelerations greater than 2000 RPMs.  Patient remained  hemodynamically stable with each one.   I then  proceeded to intravascular ultrasound to assess the length and  reference diameter for stenting.  Based on this, the total lesion length was  16 mm with a distal vessel reference of 2.5 mm and a proximal of 3.25 x 3.7  mm.  Based on this, I chose a 2.5 x 20-mm TAXUS.  I advanced this across the  lesion without difficulty and deployed it at 14 atmospheres.  I then post-  dilated the distal portion of the stent using a 2.5 x 15-mm Quantum at 16  atmospheres.  The mid-portion of the stent I post dilated using a 3.0 x 15-  mm Quantum at 18 atmospheres and the most proximal portion using a 3.5 x 8-  mm Quantum at 14 atmospheres.  I then repeated intravascular ultrasound.  This demonstrated the stent to be fully opposed with the exception of where  it crossed the large diagonal.  It was well expanded despite the  calcification.   Final angiography demonstrated no residual stenosis, no dissection, and TIMI  3 flow into both the diagonal and the LAD.  The jailed diagonal  had no  ostial stenosis.   COMPLICATIONS:  None.   IMPRESSION/RECOMMENDATIONS:  Successful rotablation and drug-eluting stent  placement in the mid-LAD, jailing the large diagonal branch.  Patient should  be maintained on a combination of aspirin and Plavix indefinitely.      Salvadore Farber, MD  Electronically Signed     WED/MEDQ  D:  09/22/2005  T:  09/23/2005  Job:  [id number]   cc:   Jonelle Sidle, MD

## 2010-08-21 ENCOUNTER — Ambulatory Visit (INDEPENDENT_AMBULATORY_CARE_PROVIDER_SITE_OTHER): Payer: Medicare Other | Admitting: Internal Medicine

## 2010-08-21 ENCOUNTER — Other Ambulatory Visit (INDEPENDENT_AMBULATORY_CARE_PROVIDER_SITE_OTHER): Payer: Medicare Other

## 2010-08-21 ENCOUNTER — Encounter: Payer: Self-pay | Admitting: Internal Medicine

## 2010-08-21 DIAGNOSIS — D696 Thrombocytopenia, unspecified: Secondary | ICD-10-CM

## 2010-08-21 DIAGNOSIS — I509 Heart failure, unspecified: Secondary | ICD-10-CM

## 2010-08-21 DIAGNOSIS — I2510 Atherosclerotic heart disease of native coronary artery without angina pectoris: Secondary | ICD-10-CM

## 2010-08-21 DIAGNOSIS — I10 Essential (primary) hypertension: Secondary | ICD-10-CM

## 2010-08-21 LAB — CBC WITH DIFFERENTIAL/PLATELET
Basophils Absolute: 0 10*3/uL (ref 0.0–0.1)
Basophils Relative: 0.4 % (ref 0.0–3.0)
Eosinophils Absolute: 0.2 10*3/uL (ref 0.0–0.7)
Eosinophils Relative: 2.4 % (ref 0.0–5.0)
HCT: 43.3 % (ref 39.0–52.0)
Hemoglobin: 14.6 g/dL (ref 13.0–17.0)
Lymphocytes Relative: 20.8 % (ref 12.0–46.0)
Lymphs Abs: 1.4 10*3/uL (ref 0.7–4.0)
MCHC: 33.8 g/dL (ref 30.0–36.0)
MCV: 93.4 fl (ref 78.0–100.0)
Monocytes Absolute: 0.8 10*3/uL (ref 0.1–1.0)
Monocytes Relative: 11.8 % (ref 3.0–12.0)
Neutro Abs: 4.5 10*3/uL (ref 1.4–7.7)
Neutrophils Relative %: 64.6 % (ref 43.0–77.0)
Platelets: 186 10*3/uL (ref 150.0–400.0)
RBC: 4.64 Mil/uL (ref 4.22–5.81)
RDW: 14 % (ref 11.5–14.6)
WBC: 7 10*3/uL (ref 4.5–10.5)

## 2010-08-21 LAB — BASIC METABOLIC PANEL
BUN: 19 mg/dL (ref 6–23)
CO2: 29 mEq/L (ref 19–32)
Calcium: 9 mg/dL (ref 8.4–10.5)
Chloride: 102 mEq/L (ref 96–112)
Creatinine, Ser: 1.1 mg/dL (ref 0.4–1.5)
GFR: 71.35 mL/min (ref >60.00)
Glucose, Bld: 94 mg/dL (ref 70–99)
Potassium: 4.6 mEq/L (ref 3.5–5.1)
Sodium: 136 mEq/L (ref 135–145)

## 2010-08-21 NOTE — Assessment & Plan Note (Signed)
Compensated on Rx Cont meds

## 2010-08-21 NOTE — Assessment & Plan Note (Signed)
On Rx 

## 2010-08-21 NOTE — Progress Notes (Signed)
  Subjective:    Patient ID: [name], male    DOB: [birth year], 77 y.o.   MRN: [id number]  HPI  The patient presents for a follow-up of  Chronic CHF, HTN, low plt Subjective:        Review of Systems  HENT: Negative for rhinorrhea and neck stiffness.   Respiratory: Negative for cough.   Cardiovascular: Negative for chest pain.  Genitourinary: Positive for urgency and decreased urine volume.  Musculoskeletal: Negative for back pain.  Neurological: Negative for tremors and syncope.  Psychiatric/Behavioral: Negative for confusion.       Objective:   Physical Exam  Constitutional: He is oriented to person, place, and time. He appears well-developed.  HENT:  Mouth/Throat: Oropharynx is clear and moist.  Eyes: Conjunctivae are normal. Pupils are equal, round, and reactive to light.  Neck: Normal range of motion. No JVD present. No thyromegaly present.  Cardiovascular: Normal rate, regular rhythm, normal heart sounds and intact distal pulses.  Exam reveals no gallop and no friction rub.   No murmur heard. Pulmonary/Chest: Effort normal and breath sounds normal. No respiratory distress. He has no wheezes. He has no rales. He exhibits no tenderness.  Abdominal: Soft. Bowel sounds are normal. He exhibits no distension and no mass. There is no tenderness. There is no rebound and no guarding.  Musculoskeletal: Normal range of motion. He exhibits no edema and no tenderness.  Lymphadenopathy:    He has no cervical adenopathy.  Neurological: He is alert and oriented to person, place, and time. He has normal reflexes. No cranial nerve deficit. He exhibits normal muscle tone. Coordination normal.  Skin: Skin is warm and dry. Rash noted. Erythema: SKs.  Psychiatric: He has a normal mood and affect. His behavior is normal. Judgment and thought content normal.           Assessment & Plan:   Review of Systems     Objective:   Physical Exam        Assessment & Plan:

## 2010-08-21 NOTE — Assessment & Plan Note (Signed)
Watching labs 

## 2010-08-22 ENCOUNTER — Telehealth: Payer: Self-pay | Admitting: Internal Medicine

## 2010-08-22 NOTE — Telephone Encounter (Signed)
[name], please, inform patient that all labs are OK Thx   

## 2010-08-25 NOTE — Telephone Encounter (Signed)
Left detailed mess informing pt of below.  

## 2010-09-28 ENCOUNTER — Emergency Department (HOSPITAL_COMMUNITY): Payer: Medicare Other

## 2010-09-28 ENCOUNTER — Observation Stay (HOSPITAL_COMMUNITY)
Admission: EM | Admit: 2010-09-28 | Discharge: 2010-09-29 | Disposition: A | Discharge status: Home / Self Care | Payer: Medicare Other | Source: Ambulatory Visit | Attending: Cardiology | Admitting: Cardiology

## 2010-09-28 DIAGNOSIS — Z7902 Long term (current) use of antithrombotics/antiplatelets: Secondary | ICD-10-CM | POA: Insufficient documentation

## 2010-09-28 DIAGNOSIS — I472 Ventricular tachycardia, unspecified: Secondary | ICD-10-CM | POA: Insufficient documentation

## 2010-09-28 DIAGNOSIS — M1990 Unspecified osteoarthritis, unspecified site: Secondary | ICD-10-CM | POA: Insufficient documentation

## 2010-09-28 DIAGNOSIS — R079 Chest pain, unspecified: Secondary | ICD-10-CM

## 2010-09-28 DIAGNOSIS — R0609 Other forms of dyspnea: Secondary | ICD-10-CM | POA: Insufficient documentation

## 2010-09-28 DIAGNOSIS — Z79899 Other long term (current) drug therapy: Secondary | ICD-10-CM | POA: Insufficient documentation

## 2010-09-28 DIAGNOSIS — Z7982 Long term (current) use of aspirin: Secondary | ICD-10-CM | POA: Insufficient documentation

## 2010-09-28 DIAGNOSIS — Z9861 Coronary angioplasty status: Secondary | ICD-10-CM | POA: Insufficient documentation

## 2010-09-28 DIAGNOSIS — E785 Hyperlipidemia, unspecified: Secondary | ICD-10-CM | POA: Insufficient documentation

## 2010-09-28 DIAGNOSIS — I4729 Other ventricular tachycardia: Secondary | ICD-10-CM | POA: Insufficient documentation

## 2010-09-28 DIAGNOSIS — I10 Essential (primary) hypertension: Secondary | ICD-10-CM | POA: Insufficient documentation

## 2010-09-28 DIAGNOSIS — R0989 Other specified symptoms and signs involving the circulatory and respiratory systems: Secondary | ICD-10-CM | POA: Insufficient documentation

## 2010-09-28 DIAGNOSIS — I2510 Atherosclerotic heart disease of native coronary artery without angina pectoris: Secondary | ICD-10-CM | POA: Insufficient documentation

## 2010-09-28 DIAGNOSIS — N400 Enlarged prostate without lower urinary tract symptoms: Secondary | ICD-10-CM | POA: Insufficient documentation

## 2010-09-28 LAB — POCT I-STAT, CHEM 8
BUN: 18 mg/dL (ref 6–23)
Calcium, Ion: 1.14 mmol/L (ref 1.12–1.32)
Chloride: 103 mEq/L (ref 96–112)
Creatinine, Ser: 1.2 mg/dL (ref 0.50–1.35)
Glucose, Bld: 117 mg/dL — ABNORMAL HIGH (ref 70–99)
HCT: 45 % (ref 39.0–52.0)
Hemoglobin: 15.3 g/dL (ref 13.0–17.0)
Potassium: 4.2 mEq/L (ref 3.5–5.1)
Sodium: 136 mEq/L (ref 135–145)
TCO2: 22 mmol/L (ref 0–100)

## 2010-09-28 LAB — POCT I-STAT TROPONIN I: Troponin i, poc: 0 ng/mL (ref 0.00–0.08)

## 2010-09-28 LAB — DIFFERENTIAL
Basophils Absolute: 0 10*3/uL (ref 0.0–0.1)
Basophils Relative: 0 % (ref 0–1)
Eosinophils Absolute: 0.2 10*3/uL (ref 0.0–0.7)
Eosinophils Relative: 2 % (ref 0–5)
Lymphocytes Relative: 22 % (ref 12–46)
Lymphs Abs: 1.9 10*3/uL (ref 0.7–4.0)
Monocytes Absolute: 1 10*3/uL (ref 0.1–1.0)
Monocytes Relative: 11 % (ref 3–12)
Neutro Abs: 5.6 10*3/uL (ref 1.7–7.7)
Neutrophils Relative %: 64 % (ref 43–77)

## 2010-09-28 LAB — BASIC METABOLIC PANEL
BUN: 17 mg/dL (ref 6–23)
CO2: 25 mEq/L (ref 19–32)
Calcium: 9 mg/dL (ref 8.4–10.5)
Chloride: 102 mEq/L (ref 96–112)
Creatinine, Ser: 1.02 mg/dL (ref 0.50–1.35)
GFR calc Af Amer: >60 mL/min (ref >60)
GFR calc non Af Amer: >60 mL/min (ref >60)
Glucose, Bld: 119 mg/dL — ABNORMAL HIGH (ref 70–99)
Potassium: 4.3 mEq/L (ref 3.5–5.1)
Sodium: 134 mEq/L — ABNORMAL LOW (ref 135–145)

## 2010-09-28 LAB — CBC
HCT: 41.6 % (ref 39.0–52.0)
Hemoglobin: 14.5 g/dL (ref 13.0–17.0)
MCH: 31.2 pg (ref 26.0–34.0)
MCHC: 34.9 g/dL (ref 30.0–36.0)
MCV: 89.5 fL (ref 78.0–100.0)
Platelets: 193 10*3/uL (ref 150–400)
RBC: 4.65 MIL/uL (ref 4.22–5.81)
RDW: 13.7 % (ref 11.5–15.5)
WBC: 8.7 10*3/uL (ref 4.0–10.5)

## 2010-09-28 LAB — CARDIAC PANEL(CRET KIN+CKTOT+MB+TROPI)
CK, MB: 2.9 ng/mL (ref 0.3–4.0)
Relative Index: INVALID (ref 0.0–2.5)
Total CK: 95 U/L (ref 7–232)
Troponin I: <0.3 ng/mL (ref <0.30)

## 2010-09-28 LAB — TROPONIN I: Troponin I: <0.3 ng/mL (ref <0.30)

## 2010-09-29 LAB — CARDIAC PANEL(CRET KIN+CKTOT+MB+TROPI)
CK, MB: 2.8 ng/mL (ref 0.3–4.0)
CK, MB: 2.8 ng/mL (ref 0.3–4.0)
Relative Index: INVALID (ref 0.0–2.5)
Relative Index: INVALID (ref 0.0–2.5)
Total CK: 92 U/L (ref 7–232)
Total CK: 95 U/L (ref 7–232)
Troponin I: <0.3 ng/mL (ref <0.30)
Troponin I: <0.3 ng/mL (ref <0.30)

## 2010-10-07 ENCOUNTER — Ambulatory Visit (HOSPITAL_COMMUNITY): Payer: Medicare Other | Attending: Cardiovascular Disease | Admitting: Radiology

## 2010-10-07 DIAGNOSIS — I2510 Atherosclerotic heart disease of native coronary artery without angina pectoris: Secondary | ICD-10-CM

## 2010-10-07 DIAGNOSIS — I4949 Other premature depolarization: Secondary | ICD-10-CM

## 2010-10-07 DIAGNOSIS — R079 Chest pain, unspecified: Secondary | ICD-10-CM | POA: Insufficient documentation

## 2010-10-07 DIAGNOSIS — R0609 Other forms of dyspnea: Secondary | ICD-10-CM

## 2010-10-07 DIAGNOSIS — R0602 Shortness of breath: Secondary | ICD-10-CM

## 2010-10-07 MED ORDER — REGADENOSON 0.4 MG/5ML IV SOLN
0.4000 mg | Freq: Once | INTRAVENOUS | Status: AC
  Administered 2010-10-07: 0.4 mg via INTRAVENOUS

## 2010-10-07 MED ORDER — TECHNETIUM TC 99M TETROFOSMIN IV KIT
11.0000 | PACK | Freq: Once | INTRAVENOUS | Status: AC | PRN
  Administered 2010-10-07: 11 via INTRAVENOUS

## 2010-10-07 MED ORDER — TECHNETIUM TC 99M TETROFOSMIN IV KIT
33.0000 | PACK | Freq: Once | INTRAVENOUS | Status: AC | PRN
  Administered 2010-10-07: 33 via INTRAVENOUS

## 2010-10-07 NOTE — Progress Notes (Signed)
[facility] SITE 3 NUCLEAR MED [address] [place] [address] [phone]  Cardiology Nuclear Med Study  [name] is a 77 y.o. male [id number] 11/30/1923   Nuclear Med Background Indication for Stress Test:  Evaluation for Ischemia, Stent Patency, PTCA Patency and 09/29/10 Post Hospital: CP, (-) enzymes History: '07 Angioplasty, 11/09 Echo: EF:60%, '07 Heart Catheterization,  '02/'07 Myocardial Infarction: VT/VFIB Arrest IWMI, 03/05 Myocardial Perfusion Study: EF 47% and Stents: '02: LCX, '07 LAD Cardiac Risk Factors: Carotid Disease, Family History - CAD, Hypertension and Lipids  Symptoms:  Chest Pain, Palpitations and SOB   Nuclear Pre-Procedure Caffeine/Decaff Intake:  None NPO After: 7:30am   Lungs:  clear IV 0.9% NS with Angio Cath:  20g  IV Site: R Antecubital  IV Started by:  Stanton Kidney, EMT-P  Chest Size (in):  38 Cup Size: n/a  Height: 5\' 8"  (1.727 m)  Weight:  158 lb (71.668 kg)  BMI:  Body mass index is 24.02 kg/(m^2). Tech Comments:  Coreg held this am, per patient.    Nuclear Med Study 1 or 2 day study: 1 day  Stress Test Type:  Treadmill/Lexiscan  Reading MD: Marca Ancona, MD  Order Authorizing Provider:  C.McAlhany  Resting Radionuclide: Technetium 45m Tetrofosmin  Resting Radionuclide Dose: 11 mCi   Stress Radionuclide:  Technetium 48m Tetrofosmin  Stress Radionuclide Dose: 33 mCi           Stress Protocol Rest HR: 55 Stress HR: 90  Rest BP: 155/91 Stress BP: 185/83  Exercise Time (min): n/a METS: n/a   Predicted Max HR: 135 bpm % Max HR: 40.74 bpm Rate Pressure Product: 21308   Dose of Adenosine (mg):  n/a Dose of Lexiscan: 0.4 mg  Dose of Atropine (mg): n/a Dose of Dobutamine: n/a mcg/kg/min (at max HR)  Stress Test Technologist: Milana Na, EMT-P  Nuclear Technologist:  Domenic Polite, CNMT     Rest Procedure:  Myocardial perfusion imaging was performed at rest 45 minutes following the intravenous  administration of Technetium 59m Tetrofosmin. Rest ECG: Sinus Bradycardia  Stress Procedure:  The patient received IV Lexiscan 0.4 mg over 15-seconds with concurrent low level exercise and then Technetium 66m Tetrofosmin was injected at 30-seconds while the patient continued walking one more minute. No symptoms with Lexiscan. There were no significant changes and rare pvcs with Lexiscan.  Quantitative spect images were obtained after a 45-minute delay. Stress ECG: No significant change from baseline ECG  QPS Raw Data Images:  Normal; no motion artifact; normal heart/lung ratio. Stress Images:  basal inferior and basal posterior perfusion defect.  Rest Images:  Basal inferior and basal posterior perfusion defect.  Subtraction (SDS):  Fixed basal inferior and basal posterior perfusion defect.  Transient Ischemic Dilatation (Normal <1.22):  .38 Lung/Heart Ratio (Normal <0.45):  .92  Quantitative Gated Spect Images QGS EDV:  103 ml QGS ESV:  49 ml QGS cine images:  Basal inferior and posterior hypokinesis.  QGS EF: 53%  Impression Exercise Capacity:  Lexiscan with no exercise. BP Response:  Normal blood pressure response. Clinical Symptoms:  No symptoms.  ECG Impression:  No significant ST segment change suggestive of ischemia. Comparison with Prior Nuclear Study: No significant change from previous study  Overall Impression:  Fixed basal inferior and posterior perfusion defect consistent with prior MI.  Basal inferior and posterior hypokinesis with mildly decreased systolic function.   Mathew Postiglione [employer]

## 2010-10-09 NOTE — H&P (Signed)
[name], [name] NO.:  [id number]  MEDICAL RECORD NO.:  [id number]  LOCATION:  2001                         FACILITY:  [facility]  PHYSICIAN:  Madolyn Frieze. Jens Som, MD, FACCDATE OF BIRTH:  February 03, 1923  DATE OF ADMISSION:  09/28/2010 DATE OF DISCHARGE:                             HISTORY & PHYSICAL   PRIMARY CARE PHYSICIAN:  Georgina Quint. Plotnikov, MD  PRIMARY CARDIOLOGIST:  [name], MD  CHIEF COMPLAINT:  Chest pain.  HISTORY OF PRESENT ILLNESS:  [name] is an 77 year old male with a history of coronary artery disease.  [name] had an episode of chest pain 3 or 4 weeks ago that radiated across his chest and lasted seconds.  It was very sharp.  It happened again about a week later.  Today, [name] was not doing anything exerting when it happened 4 or 5 times in a row.  The pain made it difficult for him to take a deep breath.  It reached approximately 7-8/10.  It was the same pain that [name] had several weeks ago, but was not like his previous anginal symptoms.  [name] exercises regularly and has had no recent chest pain with exertion.  [name] denies increased dyspnea on exertion.  Currently in the emergency room, [name] is resting comfortably.  PAST MEDICAL HISTORY: 1. History of inferior MI in December of 2002, complicated by     cardiogenic shock status post PTCA and stent to circumflex. 2. Status post cardiac catheterization in August 2007, with LAD     showing 95% stenosis in the midportion with a ruptured plaque which     was treated with a Taxus stent reducing the stenosis to 0.  RCA     50%, circumflex 50%, OM 30%, diagonal branch 70% (small vessel), EF     45-50%. 3. History of a VT VF arrest and ventilator-dependent respiratory     failure with thrombocytopenia (platelets 106) at the time of his     MI. 4. Remote history of colon polyps. 5. Hypertension. 6. Hyperlipidemia. 7. Family history of coronary artery disease. 8. Degenerative joint disease. 9.  History of BPH. 10.History of nonobstructive cerebrovascular disease. 11.History of vertigo.  SURGICAL HISTORY:  [name] is status post TURP, cardiac catheterization, total hip replacement, and colonoscopy with remote polypectomy.  ALLERGIES:  PENICILLIN.  CURRENT MEDICATIONS: 1. Aspirin 81 mg a day. 2. Vitamin B complex daily. 3. Coreg 6.25 mg b.i.d. 4. Vitamin D 1000 units daily. 5. Plavix 75 mg a day. 6. Glucosamine chondroitin b.i.d. 7. Lisinopril 20 mg 1/2 tab b.i.d. 8. Sublingual nitroglycerin p.r.n. 9. Zocor 80 mg a day.  SOCIAL HISTORY:  [name] lives in [place] with his wife.  [name] is retired from the [employer].  [name] has never smoked.  [name] exercises regularly and does not abuse alcohol or drugs.  FAMILY HISTORY:  His mother died at 34 with an MI and had previously been diagnosed with heart disease.  His father died in a diving accident at age 36, but two siblings have coronary artery disease.  REVIEW OF SYSTEMS:  [name] has multiple seborrheic keratoses.  [name] has occasional arthralgias.  [name] has not had hematemesis, hemoptysis, or melena.  [name] has not had any recent illnesses.  Full 14-point review of systems is otherwise negative except as stated in the HPI.  PHYSICAL EXAMINATION:  VITAL SIGNS:  Temperature is 97.6, blood pressure 160/85, heart rate 53, respiratory rate 14, O2 saturation 99% on room air.  GENERAL:  [name] is a well-developed elderly white male in no acute distress at rest. HEENT:  Normal. NECK:  There is no lymphadenopathy, thyromegaly, or JVD noted.  [name] has bilateral carotid bruits. CV:  His heart is regular in rate and rhythm with an S1, S2 and no clinically significant murmur, rub, or gallop is noted.  Distal pulses are intact in all four extremities and no femoral bruits are appreciated. LUNGS:  Essentially clear to auscultation bilaterally. SKIN:  No rashes are noted and [name] has multiple seborrheic keratoses. ABDOMEN:  Soft and nontender with active bowel  sounds. EXTREMITIES:  There is no sign cyanosis, clubbing, or edema noted. MUSCULOSKELETAL:  There is no joint deformity or effusions and no spine or CVA tenderness. NEUROLOGIC:  [name] is alert and oriented with no focal deficits noted.  Chest x-ray, no acute disease.  EKG is sinus bradycardia, rate 53 with no acute ST elevation and no acute ischemic changes.  LABORATORY VALUES:  Hemoglobin 14.5, hematocrit 41.6, WBC is  8.7, platelets 193.  Sodium 134, potassium 4.3, chloride 102, CO2 of 25, BUN 17, creatinine 1.02, glucose 119.  Point of care and regular troponin, both negative x1.  IMPRESSION:  [name] was seen today by Dr. Jens Som, the patient evaluated and the data reviewed.  [name] is an 77 year old male with a past medical history significant for coronary artery disease, hypertension, hyperlipidemia, and benign prostatic hypertrophy, who is here with chest pain.  Three weeks ago, [name] had sharp chest pain across his chest for 1 second.  [name] had another episode 2 weeks ago and then 5 episodes today. There were no true associated symptoms and no radiation.  It was not pleuritic.  [name] exercises routinely with no chest pain and shortness of breath. His EKG has no ST changes.  His chest pain is atypical.  We will admit him to telemetry and rule out myocardial infarction.  If his cardiac enzymes are negative, an outpatient Myoview can be pursued.  [name] will be continued on his home medications.     Theodore Demark, PA-C   ______________________________ Madolyn Frieze. Jens Som, MD, [facility]    RB/MEDQ  D:  09/28/2010  T:  09/29/2010  Job:  [id number]  Electronically Signed by Theodore Demark PA-C on 10/06/2010 02:12:13 PM Electronically Signed by Olga Millers MD [facility] on 10/09/2010 07:54:44 AM

## 2010-10-12 ENCOUNTER — Telehealth: Payer: Self-pay | Admitting: Cardiovascular Disease

## 2010-10-12 NOTE — Telephone Encounter (Signed)
Pt is calling for results of myocardial perfusion scan.  Read results to pt.

## 2010-10-12 NOTE — Telephone Encounter (Signed)
Pt returning results of stress test. Please call back to discuss further.

## 2010-10-15 NOTE — Discharge Summary (Signed)
  NAMEJAKEB, [name] NO.:  [id number]  MEDICAL RECORD NO.:  [id number]  LOCATION:  2001                         FACILITY:  [facility]  PHYSICIAN:  [name], MDDATE OF BIRTH:  [birth year]  DATE OF ADMISSION:  09/28/2010 DATE OF DISCHARGE:  09/29/2010                              DISCHARGE SUMMARY   PRIMARY CARDIOLOGIST:  [name], MD.  PRIMARY CARE PHYSICIAN:  Georgina Quint. Plotnikov, MD.  DISCHARGE DIAGNOSES: 1. Chest pain, without objective evidence of ischemia.  Secondary     diagnosis is coronary disease, status post prior circumflex and     left anterior descending stenting. 2. History of ventricular tachycardia/ventricular fibrillation arrest     at the time of myocardial infarction in 2007. 3. Hypertension. 4. Hyperlipidemia. 5. Benign prostatic hyperplasia. 6. Degenerative joint disease. 7. Nonobstructive cerebrovascular disease. 8. Vertigo. 9. Remote history of colon polyps. 10.Status post transurethral resection of the prostate. 11.Status post total hip arthroplasty.  ALLERGIES:  PENICILLIN.  PROCEDURES:  None.  HISTORY OF PRESENT ILLNESS:  An 77 year old male presented to the [facility] ED on August 27 with a 1-week history of intermittent, brief chest pain that made him difficulty taking deep breaths.  Symptoms occurred at rest and recurred several times in the day of admission prompting evaluation.  In the ED, his ECG was nonacute while point-of-care markers were negative.  He is admitted for further evaluation.  HOSPITAL COURSE:  The patient ruled out for MI.  He is had no recurrent chest discomfort and we have found no objective evidence of ischemia. Therefore, we will discharge the patient home today in good condition and I have arranged for an outpatient Lexiscan Myoview early next week.  DISCHARGE LABS:  Hemoglobin 15.3, hematocrit 45.0, WBC 8.7, and platelets 193,000.  Sodium 134, potassium 4.3, chloride 102, CO2  25, BUN 17, creatinine 1.02, glucose 119, calcium 9.0, CK 95, MB 2.9, less than 0.30.  DISPOSITION:  The patient to be discharged home today in good condition.  FOLLOWUP/APPOINTMENTS:  The patient will follow up for a Lexiscan Myoview in our office on September 5, at 11:45 a.m..  The patient is to follow up with Dr. Clifton James on September 20, at 1:45 p.m..  DISCHARGE MEDICATIONS: 1. Aspirin 81 mg daily. 2. Carvedilol 6.25 mg b.i.d. 3. Glucosamine chondroitin plus vitamin C two tablets b.i.d. 4. Lisinopril 20 mg b.i.d. 5. Nitroglycerin 0.4 mg sublingual p.r.n. chest pain. 6. Plavix 75 mg daily. 7. Simvastatin 80 mg at bedtime. 8. Vitamin B complex over-the-counter 1 tablet daily. 9. Vitamin D 1000 units 1 tablet daily.  OUTSTANDING LAB STUDIES:  Follow-up Myoview next week.  DURATION OF DISCHARGE ENCOUNTER:  Forty minutes, including physician time.     Nicolasa Ducking, ANP   ______________________________ [name], MD    CB/MEDQ  D:  09/29/2010  T:  09/29/2010  Job:  [id number]  cc:   Georgina Quint. Plotnikov, MD  Electronically Signed by Nicolasa Ducking ANP on 10/15/2010 03:16:58 PM Electronically Signed by [name] MD on 10/15/2010 09:36:16 PM

## 2010-10-22 ENCOUNTER — Encounter: Payer: Self-pay | Admitting: Cardiovascular Disease

## 2010-10-22 ENCOUNTER — Ambulatory Visit (INDEPENDENT_AMBULATORY_CARE_PROVIDER_SITE_OTHER): Payer: Medicare Other | Admitting: Cardiovascular Disease

## 2010-10-22 VITALS — BP 122/66 | HR 56 | Ht 66.0 in | Wt 163.0 lb

## 2010-10-22 DIAGNOSIS — I2510 Atherosclerotic heart disease of native coronary artery without angina pectoris: Secondary | ICD-10-CM

## 2010-10-22 MED ORDER — NITROGLYCERIN 0.4 MG SL SUBL: 0.4000 mg | SUBLINGUAL_TABLET | SUBLINGUAL | Status: DC | PRN

## 2010-10-22 NOTE — Assessment & Plan Note (Signed)
Stable. Stress test without ischemia. Continue current meds.

## 2010-10-22 NOTE — Patient Instructions (Signed)
Your physician wants you to follow-up in:  6 months. You will receive a reminder letter in the mail two months in advance. If you don't receive a letter, please call our office to schedule the follow-up appointment.   

## 2010-10-22 NOTE — Progress Notes (Signed)
History of Present Illness:77 yo WM with h/o CAD s/p posterior inferior MI 2002 with placement of a bare metal stent in the circumflex artery and subsequent unstable angina August 2007 with placement of drug eluting stent in the mid LAD , also history of HTN, hyperlipidemia who is here today for follow up. He was admitted to [facility] 09/29/10 for CP. Cardiac enzymes were negative. Stress myoview as outpt 10/07/10 without ischemia.   He has had no chest pain or SOB. No palpitations, dizziness, near syncope or syncope. He has been active.   Most recent lipids in September 2011 with TC=127, HDL 61, LDL 57. LFTs ok.    Past Medical History  Diagnosis Date  . Coronary atherosclerosis of unspecified type of vessel, native or graft   . Unspecified essential hypertension   . Other and unspecified hyperlipidemia   . Hypertrophy of prostate without urinary obstruction and other lower urinary tract symptoms (LUTS)   . Thrombocytopenia, unspecified   . Osteoarthrosis, unspecified whether generalized or localized, unspecified site   . Dizziness and giddiness     Past Surgical History  Procedure Date  . Colonoscopy   . Hip arthroplasty   . Transurethral resection of prostate     Current Outpatient Prescriptions  Medication Sig Dispense Refill  . aspirin 81 MG EC tablet Take 81 mg by mouth daily.        [name] b complex vitamins tablet Take 1 tablet by mouth daily.        . carvedilol (COREG) 6.25 MG tablet Take 6.25 mg by mouth 2 (two) times daily.        . Cholecalciferol 1000 UNITS tablet Take 1,000 Units by mouth daily.        . clopidogrel (PLAVIX) 75 MG tablet Take 75 mg by mouth daily.        . Glucosamine-Chondroit-Vit C-Mn (GLUCOSAMINE 1500 COMPLEX PO) Take by mouth 2 (two) times daily.        [name] lisinopril (PRINIVIL,ZESTRIL) 20 MG tablet Take 20 mg by mouth 2 (two) times daily.        . nitroGLYCERIN (NITROSTAT) 0.4 MG SL tablet Place 0.4 mg under the tongue every 5 (five) minutes as  needed. May repeat x 3       . simvastatin (ZOCOR) 80 MG tablet Take 80 mg by mouth at bedtime.          Allergies  Allergen Reactions  . Penicillins     History   Social History  . Marital Status: Married    Spouse Name: N/A    Number of Children: N/A  . Years of Education: N/A   Occupational History  . Retired    Social History Main Topics  . Smoking status: Never Smoker   . Smokeless tobacco: Not on file  . Alcohol Use: No  . Drug Use: No  . Sexually Active: Not Currently   Other Topics Concern  . Not on file   Social History Narrative   Regular Exercise -  YES    Family History  Problem Relation Age of Onset  . Coronary artery disease Other   . Heart disease Father     Review of Systems:  As stated in the HPI and otherwise negative.   BP 122/66  Pulse 56  Ht 5\' 6"  (1.676 m)  Wt 163 lb (73.936 kg)  BMI 26.31 kg/m2  Physical Examination: General: Well developed, well nourished, NAD HEENT: OP clear, mucus membranes moist SKIN: warm, dry. No  rashes. Neuro: No focal deficits Musculoskeletal: Muscle strength 5/5 all ext Psychiatric: Mood and affect normal Neck: No JVD, no carotid bruits, no thyromegaly, no lymphadenopathy. Lungs:Clear bilaterally, no wheezes, rhonci, crackles Cardiovascular: Regular rate and rhythm. No murmurs, gallops or rubs. Abdomen:Soft. Bowel sounds present. Non-tender.  Extremities: No lower extremity edema. Pulses are 2 + in the bilateral DP/PT.  Lexiscan myoview 10/07/10:  Stress Procedure: The patient received IV Lexiscan 0.4 mg over 15-seconds with concurrent low level exercise and then Technetium 98m Tetrofosmin was injected at 30-seconds while the patient continued walking one more minute. No symptoms with Lexiscan. There were no significant changes and rare pvcs with Lexiscan. Quantitative spect images were obtained after a 45-minute delay.  Stress ECG: No significant change from baseline ECG  QPS  Raw Data Images: Normal; no  motion artifact; normal heart/lung ratio.  Stress Images: basal inferior and basal posterior perfusion defect.  Rest Images: Basal inferior and basal posterior perfusion defect.  Subtraction (SDS): Fixed basal inferior and basal posterior perfusion defect.  Transient Ischemic Dilatation (Normal <1.22): .38  Lung/Heart Ratio (Normal <0.45): .92  Quantitative Gated Spect Images  QGS EDV: 103 ml  QGS ESV: 49 ml  QGS cine images: Basal inferior and posterior hypokinesis.  QGS EF: 53%  Impression  Exercise Capacity: Lexiscan with no exercise.  BP Response: Normal blood pressure response.  Clinical Symptoms: No symptoms.  ECG Impression: No significant ST segment change suggestive of ischemia.  Comparison with Prior Nuclear Study: No significant change from previous study  Overall Impression: Fixed basal inferior and posterior perfusion defect consistent with prior MI. Basal inferior and posterior hypokinesis with mildly decreased systolic function.

## 2010-11-18 ENCOUNTER — Other Ambulatory Visit: Payer: Self-pay

## 2010-11-18 MED ORDER — CLOPIDOGREL BISULFATE 75 MG PO TABS: 75.0000 mg | ORAL_TABLET | Freq: Every day | ORAL | Status: DC

## 2010-11-23 ENCOUNTER — Encounter: Payer: Self-pay | Admitting: Internal Medicine

## 2010-11-23 ENCOUNTER — Ambulatory Visit (INDEPENDENT_AMBULATORY_CARE_PROVIDER_SITE_OTHER): Payer: Medicare Other | Admitting: Internal Medicine

## 2010-11-23 DIAGNOSIS — I509 Heart failure, unspecified: Secondary | ICD-10-CM

## 2010-11-23 DIAGNOSIS — R972 Elevated prostate specific antigen [PSA]: Secondary | ICD-10-CM

## 2010-11-23 DIAGNOSIS — E785 Hyperlipidemia, unspecified: Secondary | ICD-10-CM

## 2010-11-23 DIAGNOSIS — I10 Essential (primary) hypertension: Secondary | ICD-10-CM

## 2010-11-23 DIAGNOSIS — I2510 Atherosclerotic heart disease of native coronary artery without angina pectoris: Secondary | ICD-10-CM

## 2010-11-23 NOTE — Assessment & Plan Note (Signed)
Continue with current prescription therapy as reflected on the Med list.  

## 2010-11-23 NOTE — Progress Notes (Signed)
  Subjective:    Patient ID: [name], male    DOB: 1923-03-07, 77 y.o.   MRN: [id number]  HPI The patient presents for a follow-up of  chronic hypertension, chronic dyslipidemia, CAD He had CP and was hospitalized at [facility]     Review of Systems  Constitutional: Negative for appetite change, fatigue and unexpected weight change.  HENT: Negative for nosebleeds, congestion, sore throat, sneezing, trouble swallowing and neck pain.   Eyes: Negative for itching and visual disturbance.  Respiratory: Negative for cough.   Cardiovascular: Negative for chest pain, palpitations and leg swelling.  Gastrointestinal: Negative for nausea, diarrhea, blood in stool and abdominal distention.  Genitourinary: Negative for frequency and hematuria.  Musculoskeletal: Negative for back pain, joint swelling and gait problem.  Skin: Negative for rash.  Neurological: Negative for dizziness, tremors, speech difficulty and weakness.  Psychiatric/Behavioral: Negative for sleep disturbance, dysphoric mood and agitation. The patient is not nervous/anxious.        Objective:   Physical Exam  Constitutional: He is oriented to person, place, and time. He appears well-developed.  HENT:  Mouth/Throat: Oropharynx is clear and moist.  Eyes: Conjunctivae are normal. Pupils are equal, round, and reactive to light.  Neck: Normal range of motion. No JVD present. No thyromegaly present.  Cardiovascular: Normal rate, regular rhythm, normal heart sounds and intact distal pulses.  Exam reveals no gallop and no friction rub.   No murmur heard. Pulmonary/Chest: Effort normal and breath sounds normal. No respiratory distress. He has no wheezes. He has no rales. He exhibits no tenderness.  Abdominal: Soft. Bowel sounds are normal. He exhibits no distension and no mass. There is no tenderness. There is no rebound and no guarding.  Musculoskeletal: Normal range of motion. He exhibits no edema and no tenderness.  Lymphadenopathy:     He has no cervical adenopathy.  Neurological: He is alert and oriented to person, place, and time. He has normal reflexes. No cranial nerve deficit. He exhibits normal muscle tone. Coordination normal.  Skin: Skin is warm and dry. No rash noted.  Psychiatric: He has a normal mood and affect. His behavior is normal. Judgment and thought content normal.    Lab Results  Component Value Date   WBC 8.7 09/28/2010   HGB 15.3 09/28/2010   HCT 45.0 09/28/2010   PLT 193 09/28/2010   GLUCOSE 119* 09/28/2010   CHOL 127 10/15/2009   TRIG 47.0 10/15/2009   HDL 61.00 10/15/2009   LDLCALC 57 10/15/2009   ALT 18 10/15/2009   AST 28 10/15/2009   NA 134* 09/28/2010   K 4.3 09/28/2010   CL 102 09/28/2010   CREATININE 1.02 09/28/2010   BUN 17 09/28/2010   CO2 25 09/28/2010   TSH 4.60 04/15/2009   PSA 4.21* 05/12/2010   HGBA1C 6.4 05/12/2010         Assessment & Plan:

## 2010-11-25 ENCOUNTER — Other Ambulatory Visit: Payer: Self-pay | Admitting: Cardiovascular Disease

## 2010-12-02 ENCOUNTER — Other Ambulatory Visit: Payer: Self-pay | Admitting: Cardiovascular Disease

## 2011-01-28 ENCOUNTER — Other Ambulatory Visit: Payer: Self-pay | Admitting: Cardiovascular Disease

## 2011-01-29 ENCOUNTER — Other Ambulatory Visit: Payer: Self-pay

## 2011-01-29 MED ORDER — LISINOPRIL 20 MG PO TABS: ORAL_TABLET | ORAL | Status: DC

## 2011-02-23 ENCOUNTER — Ambulatory Visit: Payer: Medicare Other | Admitting: Internal Medicine

## 2011-03-02 ENCOUNTER — Encounter: Payer: Self-pay | Admitting: Internal Medicine

## 2011-03-02 ENCOUNTER — Ambulatory Visit (INDEPENDENT_AMBULATORY_CARE_PROVIDER_SITE_OTHER): Payer: Medicare Other | Admitting: Internal Medicine

## 2011-03-02 DIAGNOSIS — I2510 Atherosclerotic heart disease of native coronary artery without angina pectoris: Secondary | ICD-10-CM | POA: Diagnosis not present

## 2011-03-02 DIAGNOSIS — L570 Actinic keratosis: Secondary | ICD-10-CM | POA: Diagnosis not present

## 2011-03-02 DIAGNOSIS — I6529 Occlusion and stenosis of unspecified carotid artery: Secondary | ICD-10-CM | POA: Diagnosis not present

## 2011-03-02 DIAGNOSIS — I10 Essential (primary) hypertension: Secondary | ICD-10-CM

## 2011-03-02 NOTE — Assessment & Plan Note (Signed)
Continue with current prescription therapy as reflected on the Med list.  

## 2011-03-02 NOTE — Progress Notes (Signed)
  Subjective:    Patient ID: [name], male    DOB: 09/22/1923, 78 y.o.   MRN: [id number]  HPI  The patient presents for a follow-up of  chronic hypertension, chronic dyslipidemia, CAD controlled with medicines  C/o L forearm lesions  Review of Systems  Constitutional: Negative for appetite change, fatigue and unexpected weight change.  HENT: Negative for nosebleeds, congestion, sore throat, sneezing, trouble swallowing and neck pain.   Eyes: Negative for itching and visual disturbance.  Respiratory: Negative for cough.   Cardiovascular: Negative for chest pain, palpitations and leg swelling.  Gastrointestinal: Negative for nausea, diarrhea, blood in stool and abdominal distention.  Genitourinary: Negative for frequency and hematuria.  Musculoskeletal: Negative for back pain, joint swelling and gait problem.  Skin: Positive for rash.  Neurological: Negative for dizziness, tremors, speech difficulty and weakness.  Psychiatric/Behavioral: Negative for sleep disturbance, dysphoric mood and agitation. The patient is not nervous/anxious.        Objective:   Physical Exam  Constitutional: He is oriented to person, place, and time. He appears well-developed.  HENT:  Mouth/Throat: Oropharynx is clear and moist.  Eyes: Conjunctivae are normal. Pupils are equal, round, and reactive to light.  Neck: Normal range of motion. No JVD present. No thyromegaly present.  Cardiovascular: Normal rate, regular rhythm, normal heart sounds and intact distal pulses.  Exam reveals no gallop and no friction rub.   No murmur heard. Pulmonary/Chest: Effort normal and breath sounds normal. No respiratory distress. He has no wheezes. He has no rales. He exhibits no tenderness.  Abdominal: Soft. Bowel sounds are normal. He exhibits no distension and no mass. There is no tenderness. There is no rebound and no guarding.  Musculoskeletal: Normal range of motion. He exhibits no edema and no tenderness.    Lymphadenopathy:    He has no cervical adenopathy.  Neurological: He is alert and oriented to person, place, and time. He has normal reflexes. No cranial nerve deficit. He exhibits normal muscle tone. Coordination normal.  Skin: Skin is warm and dry. Rash (AKs, SKs) noted.  Psychiatric: He has a normal mood and affect. His behavior is normal. Judgment and thought content normal.      L forearm AKs   Procedure Note :     Procedure : Cryosurgery   Indication:    Actinic keratosis(es)   Risks including unsuccessful procedure , bleeding, infection, bruising, scar, a need for a repeat  procedure and others were explained to the patient in detail as well as the benefits. Informed consent was obtained verbally.    5 lesion(s)  on L forearm  was/were treated with liquid nitrogen on a Q-tip in a usual fasion . Band-Aid was applied and antibiotic ointment was given for a later use.   Tolerated well. Complications none.   Postprocedure instructions :     Keep the wounds clean. You can wash them with liquid soap and water. Pat dry with gauze or a Kleenex tissue  Before applying antibiotic ointment and a Band-Aid.   You need to report immediately  if  any signs of infection develop.       Assessment & Plan:

## 2011-03-02 NOTE — Assessment & Plan Note (Signed)
Cryo  

## 2011-03-02 NOTE — Assessment & Plan Note (Signed)
Doppler pending

## 2011-03-23 DIAGNOSIS — H524 Presbyopia: Secondary | ICD-10-CM | POA: Diagnosis not present

## 2011-03-23 DIAGNOSIS — H2510 Age-related nuclear cataract, unspecified eye: Secondary | ICD-10-CM | POA: Diagnosis not present

## 2011-04-12 ENCOUNTER — Other Ambulatory Visit: Payer: Self-pay | Admitting: Dermatology

## 2011-04-12 DIAGNOSIS — L821 Other seborrheic keratosis: Secondary | ICD-10-CM | POA: Diagnosis not present

## 2011-04-12 DIAGNOSIS — L820 Inflamed seborrheic keratosis: Secondary | ICD-10-CM | POA: Diagnosis not present

## 2011-04-12 DIAGNOSIS — C44519 Basal cell carcinoma of skin of other part of trunk: Secondary | ICD-10-CM | POA: Diagnosis not present

## 2011-04-20 ENCOUNTER — Encounter: Payer: Self-pay | Admitting: Cardiovascular Disease

## 2011-04-20 ENCOUNTER — Ambulatory Visit (INDEPENDENT_AMBULATORY_CARE_PROVIDER_SITE_OTHER): Payer: Medicare Other | Admitting: Cardiovascular Disease

## 2011-04-20 VITALS — BP 128/65 | HR 50 | Ht 68.0 in | Wt 165.0 lb

## 2011-04-20 DIAGNOSIS — I10 Essential (primary) hypertension: Secondary | ICD-10-CM | POA: Diagnosis not present

## 2011-04-20 DIAGNOSIS — I2510 Atherosclerotic heart disease of native coronary artery without angina pectoris: Secondary | ICD-10-CM | POA: Diagnosis not present

## 2011-04-20 NOTE — Patient Instructions (Signed)
Your physician wants you to follow-up in: 6 months.  You will receive a reminder letter in the mail two months in advance. If you don't receive a letter, please call our office to schedule the follow-up appointment.  Your physician recommends that you have fasting lab work done on day of lab work with Dr. Wynonia Sours and Liver profile

## 2011-04-20 NOTE — Assessment & Plan Note (Signed)
Stable. Continue current therapy. He will need fasting lipids and LFTs. Will add to planned blood draw for next week with Dr. Posey Rea.

## 2011-04-20 NOTE — Progress Notes (Signed)
History of Present Illness: 78 yo WM with h/o CAD s/p posterior inferior MI 2002 with placement of a bare metal stent in the circumflex artery and subsequent unstable angina August 2007 with placement of drug eluting stent in the mid LAD , also history of HTN, hyperlipidemia who is here today for follow up. He was admitted to [facility] 09/29/10 for CP. Cardiac enzymes were negative. Stress myoview as outpt 10/07/10 without ischemia. I last saw him in September 2012 for follow up.   He has had no chest pain or SOB. No palpitations, dizziness, near syncope or syncope. He has been active. He is playing golf later this week.   Primary Care Physician: Plotnikov  Past Medical History  Diagnosis Date  . Coronary atherosclerosis of unspecified type of vessel, native or graft   . Unspecified essential hypertension   . Other and unspecified hyperlipidemia   . Hypertrophy of prostate without urinary obstruction and other lower urinary tract symptoms (LUTS)   . Thrombocytopenia, unspecified   . Osteoarthrosis, unspecified whether generalized or localized, unspecified site   . Dizziness and giddiness     Past Surgical History  Procedure Date  . Colonoscopy   . Hip arthroplasty   . Transurethral resection of prostate     Current Outpatient Prescriptions  Medication Sig Dispense Refill  . aspirin 81 MG EC tablet Take 81 mg by mouth daily.        [name] b complex vitamins tablet Take 1 tablet by mouth daily.        . carvedilol (COREG) 6.25 MG tablet TAKE 1 TABLET TWICE DAILY  60 tablet  10  . Cholecalciferol 1000 UNITS tablet Take 1,000 Units by mouth daily.        . clopidogrel (PLAVIX) 75 MG tablet Take 1 tablet (75 mg total) by mouth daily.  30 tablet  10  . Glucosamine-Chondroit-Vit C-Mn (GLUCOSAMINE 1500 COMPLEX PO) Take by mouth 2 (two) times daily.        [name] lisinopril (PRINIVIL,ZESTRIL) 20 MG tablet TAKE 1/2 TAB TWICE A DAY  30 tablet  6  . nitroGLYCERIN (NITROSTAT) 0.4 MG SL tablet  Place 1 tablet (0.4 mg total) under the tongue every 5 (five) minutes as needed. May repeat x 3  25 tablet  6  . simvastatin (ZOCOR) 80 MG tablet TAKE 1 TABLET BY MOUTH AT BEDTIME  30 tablet  10    Allergies  Allergen Reactions  . Penicillins     History   Social History  . Marital Status: Married    Spouse Name: N/A    Number of Children: N/A  . Years of Education: N/A   Occupational History  . Retired    Social History Main Topics  . Smoking status: Never Smoker   . Smokeless tobacco: Not on file  . Alcohol Use: No  . Drug Use: No  . Sexually Active: Not Currently   Other Topics Concern  . Not on file   Social History Narrative   Regular Exercise -  YES    Family History  Problem Relation Age of Onset  . Coronary artery disease Other   . Heart disease Father     Review of Systems:  As stated in the HPI and otherwise negative.   BP 128/65  Pulse 50  Ht 5\' 8"  (1.727 m)  Wt 165 lb (74.844 kg)  BMI 25.09 kg/m2  Physical Examination: General: Well developed, well nourished, NAD HEENT: OP clear, mucus membranes moist SKIN: warm,  dry. No rashes. Neuro: No focal deficits Musculoskeletal: Muscle strength 5/5 all ext Psychiatric: Mood and affect normal Neck: No JVD, no carotid bruits, no thyromegaly, no lymphadenopathy. Lungs:Clear bilaterally, no wheezes, rhonci, crackles Cardiovascular: Regular rate and rhythm. No murmurs, gallops or rubs. Abdomen:Soft. Bowel sounds present. Non-tender.  Extremities: No lower extremity edema. Pulses are 2 + in the bilateral DP/PT.

## 2011-04-20 NOTE — Assessment & Plan Note (Signed)
BP well controlled. No changes.  

## 2011-05-25 ENCOUNTER — Other Ambulatory Visit (INDEPENDENT_AMBULATORY_CARE_PROVIDER_SITE_OTHER): Payer: Medicare Other

## 2011-05-25 DIAGNOSIS — R972 Elevated prostate specific antigen [PSA]: Secondary | ICD-10-CM

## 2011-05-25 DIAGNOSIS — I509 Heart failure, unspecified: Secondary | ICD-10-CM | POA: Diagnosis not present

## 2011-05-25 LAB — COMPREHENSIVE METABOLIC PANEL
ALT: 20 U/L (ref 0–53)
AST: 28 U/L (ref 0–37)
Albumin: 4 g/dL (ref 3.5–5.2)
Alkaline Phosphatase: 49 U/L (ref 39–117)
BUN: 15 mg/dL (ref 6–23)
CO2: 26 mEq/L (ref 19–32)
Calcium: 9.2 mg/dL (ref 8.4–10.5)
Chloride: 103 mEq/L (ref 96–112)
Creatinine, Ser: 0.9 mg/dL (ref 0.4–1.5)
GFR: 81.93 mL/min (ref >60.00)
Glucose, Bld: 106 mg/dL — ABNORMAL HIGH (ref 70–99)
Potassium: 4.9 mEq/L (ref 3.5–5.1)
Sodium: 137 mEq/L (ref 135–145)
Total Bilirubin: 1.3 mg/dL — ABNORMAL HIGH (ref 0.3–1.2)
Total Protein: 6.8 g/dL (ref 6.0–8.3)

## 2011-05-25 LAB — PSA: PSA: 4.47 ng/mL — ABNORMAL HIGH (ref 0.10–4.00)

## 2011-05-31 ENCOUNTER — Other Ambulatory Visit: Payer: Medicare Other

## 2011-06-01 ENCOUNTER — Encounter: Payer: Self-pay | Admitting: Internal Medicine

## 2011-06-01 ENCOUNTER — Ambulatory Visit (INDEPENDENT_AMBULATORY_CARE_PROVIDER_SITE_OTHER): Payer: Medicare Other | Admitting: Internal Medicine

## 2011-06-01 VITALS — BP 94/60 | HR 80 | Temp 98.2°F | Resp 16 | Wt 164.0 lb

## 2011-06-01 DIAGNOSIS — E785 Hyperlipidemia, unspecified: Secondary | ICD-10-CM

## 2011-06-01 DIAGNOSIS — I2510 Atherosclerotic heart disease of native coronary artery without angina pectoris: Secondary | ICD-10-CM

## 2011-06-01 DIAGNOSIS — I10 Essential (primary) hypertension: Secondary | ICD-10-CM

## 2011-06-01 DIAGNOSIS — I509 Heart failure, unspecified: Secondary | ICD-10-CM

## 2011-06-01 DIAGNOSIS — N400 Enlarged prostate without lower urinary tract symptoms: Secondary | ICD-10-CM

## 2011-06-01 NOTE — Assessment & Plan Note (Signed)
Continue with current prescription therapy as reflected on the Med list.  

## 2011-06-01 NOTE — Progress Notes (Signed)
Patient ID: RONNIE DOO, male   DOB: May 31, 1923, 78 y.o.   MRN: [id number]  Subjective:    Patient ID: DENIZ HANNAN, male    DOB: 1923-06-16, 78 y.o.   MRN: [id number]  HPI The patient presents for a follow-up of  chronic hypertension, chronic dyslipidemia, CAD He had CP and was hospitalized at [facility]  BP Readings from Last 3 Encounters:  06/01/11 94/60  04/20/11 128/65  03/02/11 100/52   Wt Readings from Last 3 Encounters:  06/01/11 164 lb (74.39 kg)  04/20/11 165 lb (74.844 kg)  03/02/11 166 lb (75.297 kg)       Review of Systems  Constitutional: Negative for appetite change, fatigue and unexpected weight change.  HENT: Negative for nosebleeds, congestion, sore throat, sneezing, trouble swallowing and neck pain.   Eyes: Negative for itching and visual disturbance.  Respiratory: Negative for cough.   Cardiovascular: Negative for chest pain, palpitations and leg swelling.  Gastrointestinal: Negative for nausea, diarrhea, blood in stool and abdominal distention.  Genitourinary: Negative for frequency and hematuria.  Musculoskeletal: Negative for back pain, joint swelling and gait problem.  Skin: Negative for rash.  Neurological: Negative for dizziness, tremors, speech difficulty and weakness.  Psychiatric/Behavioral: Negative for sleep disturbance, dysphoric mood and agitation. The patient is not nervous/anxious.        Objective:   Physical Exam  Constitutional: He is oriented to person, place, and time. He appears well-developed.  HENT:  Mouth/Throat: Oropharynx is clear and moist.  Eyes: Conjunctivae are normal. Pupils are equal, round, and reactive to light.  Neck: Normal range of motion. No JVD present. No thyromegaly present.  Cardiovascular: Normal rate, regular rhythm, normal heart sounds and intact distal pulses.  Exam reveals no gallop and no friction rub.   No murmur heard. Pulmonary/Chest: Effort normal and breath sounds normal. No respiratory distress. He has  no wheezes. He has no rales. He exhibits no tenderness.  Abdominal: Soft. Bowel sounds are normal. He exhibits no distension and no mass. There is no tenderness. There is no rebound and no guarding.  Musculoskeletal: Normal range of motion. He exhibits no edema and no tenderness.  Lymphadenopathy:    He has no cervical adenopathy.  Neurological: He is alert and oriented to person, place, and time. He has normal reflexes. No cranial nerve deficit. He exhibits normal muscle tone. Coordination normal.  Skin: Skin is warm and dry. No rash noted.  Psychiatric: He has a normal mood and affect. His behavior is normal. Judgment and thought content normal.    Lab Results  Component Value Date   WBC 8.7 09/28/2010   HGB 15.3 09/28/2010   HCT 45.0 09/28/2010   PLT 193 09/28/2010   GLUCOSE 106* 05/25/2011   CHOL 127 10/15/2009   TRIG 47.0 10/15/2009   HDL 61.00 10/15/2009   LDLCALC 57 10/15/2009   ALT 20 05/25/2011   AST 28 05/25/2011   NA 137 05/25/2011   K 4.9 05/25/2011   CL 103 05/25/2011   CREATININE 0.9 05/25/2011   BUN 15 05/25/2011   CO2 26 05/25/2011   TSH 4.60 04/15/2009   PSA 4.47* 05/25/2011   HGBA1C 6.4 05/12/2010         Assessment & Plan:

## 2011-06-01 NOTE — Assessment & Plan Note (Signed)
Chronic w/elev PSA Continue with current prescription therapy as reflected on the Med list.

## 2011-08-25 ENCOUNTER — Other Ambulatory Visit: Payer: Self-pay | Admitting: Cardiovascular Disease

## 2011-09-29 ENCOUNTER — Other Ambulatory Visit (INDEPENDENT_AMBULATORY_CARE_PROVIDER_SITE_OTHER): Payer: Medicare Other

## 2011-09-29 ENCOUNTER — Ambulatory Visit (INDEPENDENT_AMBULATORY_CARE_PROVIDER_SITE_OTHER): Payer: Medicare Other | Admitting: Internal Medicine

## 2011-09-29 ENCOUNTER — Encounter: Payer: Self-pay | Admitting: Internal Medicine

## 2011-09-29 VITALS — BP 90/60 | HR 72 | Temp 97.5°F | Resp 16 | Wt 162.0 lb

## 2011-09-29 DIAGNOSIS — I10 Essential (primary) hypertension: Secondary | ICD-10-CM

## 2011-09-29 DIAGNOSIS — R7309 Other abnormal glucose: Secondary | ICD-10-CM

## 2011-09-29 DIAGNOSIS — E785 Hyperlipidemia, unspecified: Secondary | ICD-10-CM

## 2011-09-29 DIAGNOSIS — I509 Heart failure, unspecified: Secondary | ICD-10-CM

## 2011-09-29 DIAGNOSIS — I2510 Atherosclerotic heart disease of native coronary artery without angina pectoris: Secondary | ICD-10-CM

## 2011-09-29 DIAGNOSIS — N400 Enlarged prostate without lower urinary tract symptoms: Secondary | ICD-10-CM

## 2011-09-29 DIAGNOSIS — R42 Dizziness and giddiness: Secondary | ICD-10-CM

## 2011-09-29 LAB — CBC WITH DIFFERENTIAL/PLATELET
Basophils Absolute: 0 10*3/uL (ref 0.0–0.1)
Basophils Relative: 0.5 % (ref 0.0–3.0)
Eosinophils Absolute: 0.1 10*3/uL (ref 0.0–0.7)
Eosinophils Relative: 1.8 % (ref 0.0–5.0)
HCT: 42.5 % (ref 39.0–52.0)
Hemoglobin: 14 g/dL (ref 13.0–17.0)
Lymphocytes Relative: 25.5 % (ref 12.0–46.0)
Lymphs Abs: 2 10*3/uL (ref 0.7–4.0)
MCHC: 33 g/dL (ref 30.0–36.0)
MCV: 95.2 fl (ref 78.0–100.0)
Monocytes Absolute: 0.9 10*3/uL (ref 0.1–1.0)
Monocytes Relative: 11.7 % (ref 3.0–12.0)
Neutro Abs: 4.7 10*3/uL (ref 1.4–7.7)
Neutrophils Relative %: 60.5 % (ref 43.0–77.0)
Platelets: 204 10*3/uL (ref 150.0–400.0)
RBC: 4.46 Mil/uL (ref 4.22–5.81)
RDW: 14.3 % (ref 11.5–14.6)
WBC: 7.7 10*3/uL (ref 4.5–10.5)

## 2011-09-29 LAB — BASIC METABOLIC PANEL
BUN: 25 mg/dL — ABNORMAL HIGH (ref 6–23)
CO2: 25 mEq/L (ref 19–32)
Calcium: 9 mg/dL (ref 8.4–10.5)
Chloride: 100 mEq/L (ref 96–112)
Creatinine, Ser: 1.2 mg/dL (ref 0.4–1.5)
GFR: 61.59 mL/min (ref >60.00)
Glucose, Bld: 122 mg/dL — ABNORMAL HIGH (ref 70–99)
Potassium: 5 mEq/L (ref 3.5–5.1)
Sodium: 132 mEq/L — ABNORMAL LOW (ref 135–145)

## 2011-09-29 NOTE — Assessment & Plan Note (Signed)
Continue with current prescription therapy as reflected on the Med list.  

## 2011-09-29 NOTE — Progress Notes (Signed)
   Subjective:    Patient ID: [name], male    DOB: 07/01/1923, 78 y.o.   MRN: [id number]  HPI  The patient presents for a follow-up of  chronic hypertension, chronic dyslipidemia, CAD controlled with medicines  Wt Readings from Last 3 Encounters:  09/29/11 162 lb (73.483 kg)  06/01/11 164 lb (74.39 kg)  04/20/11 165 lb (74.844 kg)   BP Readings from Last 3 Encounters:  09/29/11 90/60  06/01/11 94/60  04/20/11 128/65      Review of Systems  Constitutional: Negative for appetite change, fatigue and unexpected weight change.  HENT: Negative for nosebleeds, congestion, sore throat, sneezing, trouble swallowing and neck pain.   Eyes: Negative for itching and visual disturbance.  Respiratory: Negative for cough.   Cardiovascular: Negative for chest pain, palpitations and leg swelling.  Gastrointestinal: Negative for nausea, diarrhea, blood in stool and abdominal distention.  Genitourinary: Negative for frequency and hematuria.  Musculoskeletal: Negative for back pain, joint swelling and gait problem.  Skin: Positive for rash.  Neurological: Negative for dizziness, tremors, speech difficulty and weakness.  Psychiatric/Behavioral: Negative for disturbed wake/sleep cycle, dysphoric mood and agitation. The patient is not nervous/anxious.        Objective:   Physical Exam  Constitutional: He is oriented to person, place, and time. He appears well-developed.  HENT:  Mouth/Throat: Oropharynx is clear and moist.  Eyes: Conjunctivae are normal. Pupils are equal, round, and reactive to light.  Neck: Normal range of motion. No JVD present. No thyromegaly present.  Cardiovascular: Normal rate, regular rhythm, normal heart sounds and intact distal pulses.  Exam reveals no gallop and no friction rub.   No murmur heard. Pulmonary/Chest: Effort normal and breath sounds normal. No respiratory distress. He has no wheezes. He has no rales. He exhibits no tenderness.  Abdominal: Soft.  Bowel sounds are normal. He exhibits no distension and no mass. There is no tenderness. There is no rebound and no guarding.  Musculoskeletal: Normal range of motion. He exhibits no edema and no tenderness.  Lymphadenopathy:    He has no cervical adenopathy.  Neurological: He is alert and oriented to person, place, and time. He has normal reflexes. No cranial nerve deficit. He exhibits normal muscle tone. Coordination normal.  Skin: Skin is warm and dry. Rash (AKs, SKs) noted.  Psychiatric: He has a normal mood and affect. His behavior is normal. Judgment and thought content normal.     Lab Results  Component Value Date   WBC 8.7 09/28/2010   HGB 15.3 09/28/2010   HCT 45.0 09/28/2010   PLT 193 09/28/2010   GLUCOSE 106* 05/25/2011   CHOL 127 10/15/2009   TRIG 47.0 10/15/2009   HDL 61.00 10/15/2009   LDLCALC 57 10/15/2009   ALT 20 05/25/2011   AST 28 05/25/2011   NA 137 05/25/2011   K 4.9 05/25/2011   CL 103 05/25/2011   CREATININE 0.9 05/25/2011   BUN 15 05/25/2011   CO2 26 05/25/2011   TSH 4.60 04/15/2009   PSA 4.47* 05/25/2011   HGBA1C 6.4 05/12/2010        Assessment & Plan:

## 2011-09-29 NOTE — Assessment & Plan Note (Signed)
resolved 

## 2011-10-05 DIAGNOSIS — L0390 Cellulitis, unspecified: Secondary | ICD-10-CM | POA: Diagnosis not present

## 2011-10-05 DIAGNOSIS — L0291 Cutaneous abscess, unspecified: Secondary | ICD-10-CM | POA: Diagnosis not present

## 2011-10-11 ENCOUNTER — Other Ambulatory Visit: Payer: Self-pay | Admitting: Cardiovascular Disease

## 2011-10-18 ENCOUNTER — Other Ambulatory Visit: Payer: Self-pay | Admitting: Dermatology

## 2011-10-18 DIAGNOSIS — D485 Neoplasm of uncertain behavior of skin: Secondary | ICD-10-CM | POA: Diagnosis not present

## 2011-10-18 DIAGNOSIS — L820 Inflamed seborrheic keratosis: Secondary | ICD-10-CM | POA: Diagnosis not present

## 2011-10-18 DIAGNOSIS — L723 Sebaceous cyst: Secondary | ICD-10-CM | POA: Diagnosis not present

## 2011-10-18 DIAGNOSIS — L570 Actinic keratosis: Secondary | ICD-10-CM | POA: Diagnosis not present

## 2011-10-18 DIAGNOSIS — L821 Other seborrheic keratosis: Secondary | ICD-10-CM | POA: Diagnosis not present

## 2011-10-18 DIAGNOSIS — L819 Disorder of pigmentation, unspecified: Secondary | ICD-10-CM | POA: Diagnosis not present

## 2011-10-20 ENCOUNTER — Encounter: Payer: Self-pay | Admitting: Cardiovascular Disease

## 2011-10-20 ENCOUNTER — Ambulatory Visit (INDEPENDENT_AMBULATORY_CARE_PROVIDER_SITE_OTHER): Payer: Medicare Other | Admitting: Cardiovascular Disease

## 2011-10-20 VITALS — BP 115/63 | HR 41 | Ht 68.0 in | Wt 162.0 lb

## 2011-10-20 DIAGNOSIS — I2510 Atherosclerotic heart disease of native coronary artery without angina pectoris: Secondary | ICD-10-CM | POA: Diagnosis not present

## 2011-10-20 DIAGNOSIS — E785 Hyperlipidemia, unspecified: Secondary | ICD-10-CM | POA: Diagnosis not present

## 2011-10-20 MED ORDER — CARVEDILOL 3.125 MG PO TABS: 3.1250 mg | ORAL_TABLET | Freq: Two times a day (BID) | ORAL | Status: DC

## 2011-10-20 NOTE — Patient Instructions (Addendum)
Your physician wants you to follow-up in:12 months.   You will receive a reminder letter in the mail two months in advance. If you don't receive a letter, please call our office to schedule the follow-up appointment.  Your physician recommends that you return for fasting lab work later this week or next week--Lipid and liver profile  Your physician has recommended you make the following change in your medication: Decrease carvedilol to 3.125 mg by mouth twice daily.

## 2011-10-20 NOTE — Progress Notes (Signed)
History of Present Illness: 78 yo Riddle with h/o CAD s/p posterior inferior MI 2002 with placement of a bare metal stent in the circumflex artery and subsequent unstable angina August 2007 with placement of drug eluting stent in the mid LAD , also history of HTN, hyperlipidemia who is here today for follow up. He was admitted to [facility] 09/29/10 for CP. Cardiac enzymes were negative. Stress myoview as outpt 10/07/10 without ischemia. I last saw him in March 2013 for follow up.   He has had no chest pain or SOB. No palpitations, dizziness, near syncope or syncope. He has been active. He is playing golf later this week. He has been working in the yard.   Primary Care Physician: Plotnikov  Last Lipid Profile:Lipid Panel     Component Value Date/Time   CHOL 127 10/15/2009 0833   TRIG 47.0 10/15/2009 0833   HDL 61.00 10/15/2009 0833   CHOLHDL 2 10/15/2009 0833   VLDL 9.4 10/15/2009 0833   LDLCALC 57 10/15/2009 0833     Past Medical History  Diagnosis Date  . Coronary atherosclerosis of unspecified type of vessel, native or graft   . Unspecified essential hypertension   . Other and unspecified hyperlipidemia   . Hypertrophy of prostate without urinary obstruction and other lower urinary tract symptoms (LUTS)   . Thrombocytopenia, unspecified   . Osteoarthrosis, unspecified whether generalized or localized, unspecified site   . Dizziness and giddiness     Past Surgical History  Procedure Date  . Colonoscopy   . Hip arthroplasty   . Transurethral resection of prostate     Current Outpatient Prescriptions  Medication Sig Dispense Refill  . aspirin 81 MG EC tablet Take 81 mg by mouth daily.        [name] b complex vitamins tablet Take 1 tablet by mouth daily.        . carvedilol (COREG) 6.25 MG tablet TAKE 1 TABLET TWICE DAILY  60 tablet  10  . Cholecalciferol 1000 UNITS tablet Take 1,000 Units by mouth daily.        . clopidogrel (PLAVIX) 75 MG tablet TAKE 1 TABLET BY MOUTH EVERY DAY   30 tablet  10  . Glucosamine-Chondroit-Vit C-Mn (GLUCOSAMINE 1500 COMPLEX PO) Take by mouth 2 (two) times daily.        [name] lisinopril (PRINIVIL,ZESTRIL) 20 MG tablet TAKE 1/2 TAB TWICE A DAY  30 tablet  6  . nitroGLYCERIN (NITROSTAT) 0.4 MG SL tablet Place 1 tablet (0.4 mg total) under the tongue every 5 (five) minutes as needed. May repeat x 3  25 tablet  6  . simvastatin (ZOCOR) 80 MG tablet TAKE 1 TABLET BY MOUTH AT BEDTIME  30 tablet  10    Allergies  Allergen Reactions  . Penicillins     History   Social History  . Marital Status: Married    Spouse Name: N/A    Number of Children: N/A  . Years of Education: N/A   Occupational History  . Retired    Social History Main Topics  . Smoking status: Never Smoker   . Smokeless tobacco: Not on file  . Alcohol Use: No  . Drug Use: No  . Sexually Active: Not Currently   Other Topics Concern  . Not on file   Social History Narrative   Regular Exercise -  YES    Family History  Problem Relation Age of Onset  . Coronary artery disease Other   . Heart disease Father  Review of Systems:  As stated in the HPI and otherwise negative.   BP 115/Ricky  Pulse 41  Ht 5\' 8"  (1.727 m)  Wt 162 lb (73.483 kg)  BMI 24.Ricky kg/m2  Physical Examination: General: Well developed, well nourished, NAD HEENT: OP clear, mucus membranes moist SKIN: warm, dry. No rashes. Neuro: No focal deficits Musculoskeletal: Muscle strength 5/5 all ext Psychiatric: Mood and affect normal Neck: No JVD, no carotid bruits, no thyromegaly, no lymphadenopathy. Lungs:Clear bilaterally, no wheezes, rhonci, crackles Cardiovascular: Huston Foley, regular rhythm. No murmurs, gallops or rubs. Abdomen:Soft. Bowel sounds present. Non-tender.  Extremities: No lower extremity edema. Pulses are 2 + in the bilateral DP/PT.  EKG: Marked sinus bradycardia, rate 41 bpm.   Assessment and Plan:   1. CORONARY ARTERY DISEASE: Stable. Will lower his Coreg to 3.125 mg po BID  given bradycardia. Continue statin.  He will need fasting lipids and LFTs next week.   2. HYPERTENSION: BP well controlled. No changes

## 2011-10-26 ENCOUNTER — Other Ambulatory Visit: Payer: Self-pay | Admitting: Cardiovascular Disease

## 2011-10-26 ENCOUNTER — Other Ambulatory Visit (INDEPENDENT_AMBULATORY_CARE_PROVIDER_SITE_OTHER): Payer: Medicare Other

## 2011-10-26 DIAGNOSIS — E785 Hyperlipidemia, unspecified: Secondary | ICD-10-CM | POA: Diagnosis not present

## 2011-10-26 LAB — LIPID PANEL
Cholesterol: 125 mg/dL (ref 0–200)
HDL: 64.4 mg/dL (ref >39.00)
LDL Cholesterol: 52 mg/dL (ref 0–99)
Total CHOL/HDL Ratio: 2
Triglycerides: 44 mg/dL (ref 0.0–149.0)
VLDL: 8.8 mg/dL (ref 0.0–40.0)

## 2011-10-26 LAB — HEPATIC FUNCTION PANEL
ALT: 19 U/L (ref 0–53)
AST: 27 U/L (ref 0–37)
Albumin: 3.9 g/dL (ref 3.5–5.2)
Alkaline Phosphatase: 48 U/L (ref 39–117)
Bilirubin, Direct: 0.2 mg/dL (ref 0.0–0.3)
Total Bilirubin: 1.3 mg/dL — ABNORMAL HIGH (ref 0.3–1.2)
Total Protein: 7.1 g/dL (ref 6.0–8.3)

## 2011-10-31 ENCOUNTER — Other Ambulatory Visit: Payer: Self-pay | Admitting: Cardiovascular Disease

## 2011-11-18 ENCOUNTER — Telehealth: Payer: Self-pay | Admitting: Cardiovascular Disease

## 2011-11-18 DIAGNOSIS — I2510 Atherosclerotic heart disease of native coronary artery without angina pectoris: Secondary | ICD-10-CM

## 2011-11-18 MED ORDER — CARVEDILOL 3.125 MG PO TABS: 3.1250 mg | ORAL_TABLET | Freq: Two times a day (BID) | ORAL | Status: DC

## 2011-11-18 NOTE — Telephone Encounter (Signed)
New Problem:    Patient's wife called in needing a 90 refill of her husband's carvedilol (COREG) 3.125 MG tablet.

## 2012-01-17 ENCOUNTER — Other Ambulatory Visit (INDEPENDENT_AMBULATORY_CARE_PROVIDER_SITE_OTHER): Payer: Medicare Other

## 2012-01-17 DIAGNOSIS — I2510 Atherosclerotic heart disease of native coronary artery without angina pectoris: Secondary | ICD-10-CM

## 2012-01-17 LAB — LIPID PANEL
Cholesterol: 115 mg/dL (ref 0–200)
HDL: 63.6 mg/dL (ref >39.00)
LDL Cholesterol: 39 mg/dL (ref 0–99)
Total CHOL/HDL Ratio: 2
Triglycerides: 60 mg/dL (ref 0.0–149.0)
VLDL: 12 mg/dL (ref 0.0–40.0)

## 2012-01-17 LAB — HEPATIC FUNCTION PANEL
ALT: 18 U/L (ref 0–53)
AST: 26 U/L (ref 0–37)
Albumin: 3.8 g/dL (ref 3.5–5.2)
Alkaline Phosphatase: 53 U/L (ref 39–117)
Bilirubin, Direct: 0.1 mg/dL (ref 0.0–0.3)
Total Bilirubin: 1.4 mg/dL — ABNORMAL HIGH (ref 0.3–1.2)
Total Protein: 7 g/dL (ref 6.0–8.3)

## 2012-02-09 ENCOUNTER — Encounter: Payer: Self-pay | Admitting: Internal Medicine

## 2012-02-09 ENCOUNTER — Ambulatory Visit (INDEPENDENT_AMBULATORY_CARE_PROVIDER_SITE_OTHER): Payer: Medicare Other | Admitting: Internal Medicine

## 2012-02-09 VITALS — BP 128/72 | HR 72 | Temp 97.8°F | Resp 16 | Wt 167.0 lb

## 2012-02-09 DIAGNOSIS — I2510 Atherosclerotic heart disease of native coronary artery without angina pectoris: Secondary | ICD-10-CM | POA: Diagnosis not present

## 2012-02-09 DIAGNOSIS — R972 Elevated prostate specific antigen [PSA]: Secondary | ICD-10-CM

## 2012-02-09 DIAGNOSIS — I10 Essential (primary) hypertension: Secondary | ICD-10-CM | POA: Diagnosis not present

## 2012-02-09 DIAGNOSIS — R42 Dizziness and giddiness: Secondary | ICD-10-CM | POA: Diagnosis not present

## 2012-02-09 NOTE — Assessment & Plan Note (Signed)
Recurrent, mild

## 2012-02-09 NOTE — Assessment & Plan Note (Signed)
Continue with current prescription therapy as reflected on the Med list.  

## 2012-02-09 NOTE — Progress Notes (Signed)
Patient ID: [name], male   DOB: 24-Apr-1923, 79 y.o.   MRN: [id number]   Subjective:    Patient ID: [name], male    DOB: 09/06/1923, 79 y.o.   MRN: [id number]  HPI  The patient presents for a follow-up of  chronic hypertension, chronic dyslipidemia, CAD controlled with medicines  Wt Readings from Last 3 Encounters:  02/09/12 167 lb (75.751 kg)  10/20/11 162 lb (73.483 kg)  09/29/11 162 lb (73.483 kg)   BP Readings from Last 3 Encounters:  02/09/12 128/72  10/20/11 115/63  09/29/11 90/60      Review of Systems  Constitutional: Negative for appetite change, fatigue and unexpected weight change.  HENT: Negative for nosebleeds, congestion, sore throat, sneezing, trouble swallowing and neck pain.   Eyes: Negative for itching and visual disturbance.  Respiratory: Negative for cough.   Cardiovascular: Negative for chest pain, palpitations and leg swelling.  Gastrointestinal: Negative for nausea, diarrhea, blood in stool and abdominal distention.  Genitourinary: Negative for frequency and hematuria.  Musculoskeletal: Negative for back pain, joint swelling and gait problem.  Skin: Positive for rash.  Neurological: Negative for dizziness, tremors, speech difficulty and weakness.  Psychiatric/Behavioral: Negative for sleep disturbance, dysphoric mood and agitation. The patient is not nervous/anxious.        Objective:   Physical Exam  Constitutional: He is oriented to person, place, and time. He appears well-developed.  HENT:  Mouth/Throat: Oropharynx is clear and moist.  Eyes: Conjunctivae normal are normal. Pupils are equal, round, and reactive to light.  Neck: Normal range of motion. No JVD present. No thyromegaly present.  Cardiovascular: Normal rate, regular rhythm, normal heart sounds and intact distal pulses.  Exam reveals no gallop and no friction rub.   No murmur heard. Pulmonary/Chest: Effort normal and breath sounds normal. No respiratory distress. He has no  wheezes. He has no rales. He exhibits no tenderness.  Abdominal: Soft. Bowel sounds are normal. He exhibits no distension and no mass. There is no tenderness. There is no rebound and no guarding.  Musculoskeletal: Normal range of motion. He exhibits no edema and no tenderness.  Lymphadenopathy:    He has no cervical adenopathy.  Neurological: He is alert and oriented to person, place, and time. He has normal reflexes. No cranial nerve deficit. He exhibits normal muscle tone. Coordination normal.  Skin: Skin is warm and dry. Rash (AKs, SKs) noted.  Psychiatric: He has a normal mood and affect. His behavior is normal. Judgment and thought content normal.     Lab Results  Component Value Date   WBC 7.7 09/29/2011   HGB 14.0 09/29/2011   HCT 42.5 09/29/2011   PLT 204.0 09/29/2011   GLUCOSE 122* 09/29/2011   CHOL 115 01/17/2012   TRIG 60.0 01/17/2012   HDL 63.60 01/17/2012   LDLCALC 39 01/17/2012   ALT 18 01/17/2012   AST 26 01/17/2012   NA 132* 09/29/2011   K 5.0 09/29/2011   CL 100 09/29/2011   CREATININE 1.2 09/29/2011   BUN 25* 09/29/2011   CO2 25 09/29/2011   TSH 4.60 04/15/2009   PSA 4.47* 05/25/2011   HGBA1C 6.4 05/12/2010        Assessment & Plan:

## 2012-02-09 NOTE — Assessment & Plan Note (Signed)
Check PSA. ?

## 2012-03-14 ENCOUNTER — Other Ambulatory Visit: Payer: Self-pay | Admitting: Cardiovascular Disease

## 2012-03-23 ENCOUNTER — Emergency Department (HOSPITAL_COMMUNITY): Payer: Medicare Other

## 2012-03-23 ENCOUNTER — Emergency Department (HOSPITAL_COMMUNITY)
Admission: EM | Admit: 2012-03-23 | Discharge: 2012-03-23 | Disposition: A | Discharge status: Home / Self Care | Payer: Medicare Other | Attending: Emergency Medicine | Admitting: Emergency Medicine

## 2012-03-23 ENCOUNTER — Encounter (HOSPITAL_COMMUNITY): Payer: Self-pay | Admitting: *Deleted

## 2012-03-23 DIAGNOSIS — Z87448 Personal history of other diseases of urinary system: Secondary | ICD-10-CM | POA: Diagnosis not present

## 2012-03-23 DIAGNOSIS — M549 Dorsalgia, unspecified: Secondary | ICD-10-CM | POA: Diagnosis not present

## 2012-03-23 DIAGNOSIS — Z8739 Personal history of other diseases of the musculoskeletal system and connective tissue: Secondary | ICD-10-CM | POA: Diagnosis not present

## 2012-03-23 DIAGNOSIS — M25519 Pain in unspecified shoulder: Secondary | ICD-10-CM | POA: Diagnosis not present

## 2012-03-23 DIAGNOSIS — Z7982 Long term (current) use of aspirin: Secondary | ICD-10-CM | POA: Insufficient documentation

## 2012-03-23 DIAGNOSIS — R6889 Other general symptoms and signs: Secondary | ICD-10-CM | POA: Diagnosis not present

## 2012-03-23 DIAGNOSIS — I2510 Atherosclerotic heart disease of native coronary artery without angina pectoris: Secondary | ICD-10-CM | POA: Insufficient documentation

## 2012-03-23 DIAGNOSIS — Z862 Personal history of diseases of the blood and blood-forming organs and certain disorders involving the immune mechanism: Secondary | ICD-10-CM | POA: Insufficient documentation

## 2012-03-23 DIAGNOSIS — J42 Unspecified chronic bronchitis: Secondary | ICD-10-CM | POA: Diagnosis not present

## 2012-03-23 DIAGNOSIS — R9431 Abnormal electrocardiogram [ECG] [EKG]: Secondary | ICD-10-CM | POA: Diagnosis not present

## 2012-03-23 DIAGNOSIS — Z7902 Long term (current) use of antithrombotics/antiplatelets: Secondary | ICD-10-CM | POA: Diagnosis not present

## 2012-03-23 DIAGNOSIS — I10 Essential (primary) hypertension: Secondary | ICD-10-CM | POA: Diagnosis not present

## 2012-03-23 DIAGNOSIS — E785 Hyperlipidemia, unspecified: Secondary | ICD-10-CM | POA: Diagnosis not present

## 2012-03-23 DIAGNOSIS — M546 Pain in thoracic spine: Secondary | ICD-10-CM | POA: Diagnosis not present

## 2012-03-23 MED ORDER — SODIUM CHLORIDE 0.9 % IV BOLUS (SEPSIS)
500.0000 mL | Freq: Once | INTRAVENOUS | Status: AC
  Administered 2012-03-23: 500 mL via INTRAVENOUS

## 2012-03-23 MED ORDER — HYDROMORPHONE HCL PF 1 MG/ML IJ SOLN
1.0000 mg | Freq: Once | INTRAMUSCULAR | Status: AC
  Administered 2012-03-23: 1 mg via INTRAVENOUS
  Filled 2012-03-23: qty 1

## 2012-03-23 MED ORDER — ONDANSETRON 4 MG PO TBDP
8.0000 mg | ORAL_TABLET | Freq: Once | ORAL | Status: AC
Start: 2012-03-23 — End: 2012-03-23
  Administered 2012-03-23: 8 mg via ORAL
  Filled 2012-03-23: qty 2

## 2012-03-23 MED ORDER — METOCLOPRAMIDE HCL 5 MG/ML IJ SOLN
10.0000 mg | Freq: Once | INTRAMUSCULAR | Status: AC
  Administered 2012-03-23: 10 mg via INTRAVENOUS
  Filled 2012-03-23: qty 2

## 2012-03-23 MED ORDER — ORPHENADRINE CITRATE ER 100 MG PO TB12: 100.0000 mg | ORAL_TABLET | Freq: Two times a day (BID) | ORAL | Status: DC

## 2012-03-23 MED ORDER — ONDANSETRON HCL 4 MG/2ML IJ SOLN
4.0000 mg | Freq: Once | INTRAMUSCULAR | Status: AC
  Administered 2012-03-23: 4 mg via INTRAVENOUS
  Filled 2012-03-23: qty 2

## 2012-03-23 MED ORDER — OXYCODONE-ACETAMINOPHEN 5-325 MG PO TABS: 1.0000 | ORAL_TABLET | ORAL | Status: DC | PRN

## 2012-03-23 NOTE — ED Notes (Signed)
Pt reports he is feeling a little better. Pt sitting up in bed and drinking sprite.

## 2012-03-23 NOTE — ED Notes (Signed)
Pt has vomited 2x since being put up for discharge, per MD, RN will start IV

## 2012-03-23 NOTE — ED Notes (Signed)
Pt reports he feels nauseous. sts he hasn't eaten anything this morning. Pt given ginger ale and crackers.

## 2012-03-23 NOTE — ED Notes (Signed)
Pt in radiology 

## 2012-03-23 NOTE — ED Notes (Signed)
Pt denies nausea, and sts he is ready to go home. Dr. Preston Fleeting spoke with pt and family and he thinks the pt is ready to go home to. D/c instructions reviewed with the pt and then pt taken to d/c in wheelchair. Left with family.

## 2012-03-23 NOTE — ED Notes (Signed)
Pt able to keep food and fluids down. sts he thinks he is able to go home now. Pt is able to walk with no issues. Pt in nad. Skin warm and dry, resp e/u.

## 2012-03-23 NOTE — ED Notes (Signed)
Per EMS- pt began having back pain 2 days ago that worsened last night. Pt reports intermittent pain. BP 210/100 hr 62.

## 2012-03-23 NOTE — ED Notes (Signed)
Pt vomited X 1. Pt sts he is still nauseous and wife is afraid she can't take care of him at home. Dr. Preston Fleeting made aware.

## 2012-03-23 NOTE — ED Provider Notes (Signed)
History     CSN: [id number]  Arrival date & time 03/23/12  0704   First MD Initiated Contact with Patient 03/23/12 [phone]      Chief Complaint  Patient presents with  . Back Pain    (Consider location/radiation/quality/duration/timing/severity/associated sxs/prior treatment) Patient is a 79 y.o. male presenting with back pain. The history is provided by the patient.  Back Pain He has been having pain in the right scapular area for the last 2 days. He denies any unusual activity or trauma. It is worse with movement and worse with deep breathing. It is severe and he rates at 10/10. He felt a little bit better yesterday and was a little bit more active than he had been 2 days ago but still without any significant exertion. This morning, his pain was so severe that he could not even roll over in bed. Pain tends to wax and wane. It is that if he sits still. He has not taken any pain medication for it. There is no radiation of pain. He denies cough, dyspnea, nausea, diaphoresis, fever, chills.  Past Medical History  Diagnosis Date  . Coronary atherosclerosis of unspecified type of vessel, native or graft   . Unspecified essential hypertension   . Other and unspecified hyperlipidemia   . Hypertrophy of prostate without urinary obstruction and other lower urinary tract symptoms (LUTS)   . Thrombocytopenia, unspecified   . Osteoarthrosis, unspecified whether generalized or localized, unspecified site   . Dizziness and giddiness     Past Surgical History  Procedure Laterality Date  . Colonoscopy    . Hip arthroplasty    . Transurethral resection of prostate      Family History  Problem Relation Age of Onset  . Coronary artery disease Other   . Heart disease Father     History  Substance Use Topics  . Smoking status: Never Smoker   . Smokeless tobacco: Not on file  . Alcohol Use: No      Review of Systems  Musculoskeletal: Positive for back pain.  All other systems reviewed  and are negative.    Allergies  Penicillins  Home Medications   Current Outpatient Rx  Name  Route  Sig  Dispense  Refill  . aspirin 81 MG EC tablet   Oral   Take 81 mg by mouth daily.           [name] b complex vitamins tablet   Oral   Take 1 tablet by mouth daily.           . carvedilol (COREG) 3.125 MG tablet   Oral   Take 1 tablet (3.125 mg total) by mouth 2 (two) times daily with a meal.   180 tablet   4   . carvedilol (COREG) 6.25 MG tablet      TAKE 1 TABLET TWICE DAILY   60 tablet   10   . Cholecalciferol 1000 UNITS tablet   Oral   Take 1,000 Units by mouth daily.           . clopidogrel (PLAVIX) 75 MG tablet      TAKE 1 TABLET BY MOUTH EVERY DAY   30 tablet   10   . Glucosamine-Chondroit-Vit C-Mn (GLUCOSAMINE 1500 COMPLEX PO)   Oral   Take by mouth 2 (two) times daily.           [name] lisinopril (PRINIVIL,ZESTRIL) 20 MG tablet      TAKE 1/2 TAB TWICE A DAY   30  tablet   6   . nitroGLYCERIN (NITROSTAT) 0.4 MG SL tablet   Sublingual   Place 1 tablet (0.4 mg total) under the tongue every 5 (five) minutes as needed. May repeat x 3   25 tablet   6   . simvastatin (ZOCOR) 80 MG tablet      TAKE 1 TABLET BY MOUTH AT BEDTIME   30 tablet   10     BP 175/89  Pulse 57  Temp(Src) 97.9 F (36.6 C) (Oral)  Resp 16  SpO2 100%  Physical Exam  Nursing note and vitals reviewed.  79 year old male, who appears uncomfortable but is in no acute distress. Vital signs are significant for bradycardia with heart rate of 57, and hypertension with blood pressure 175/89. Oxygen saturation is 100%, which is normal. Head is normocephalic and atraumatic. PERRLA, EOMI. Oropharynx is clear. Neck is nontender and supple without adenopathy or JVD. Back has point tenderness just inferior to the right scapula. There is no CVA tenderness. Lungs are clear without rales, wheezes, or rhonchi. Chest is nontender. Heart has regular rate and rhythm without murmur. Abdomen  is soft, flat, nontender without masses or hepatosplenomegaly and peristalsis is normoactive. Extremities have no cyanosis or edema, full range of motion is present. Skin is warm and dry without rash. Neurologic: Mental status is normal, cranial nerves are intact, there are no motor or sensory deficits.  ED Course  Procedures (including critical care time)  Dg Ribs Unilateral W/chest Right  03/23/2012  *RADIOLOGY REPORT*  Clinical Data: Chest pain.  RIGHT RIBS AND CHEST - 3+ VIEW  Comparison: 09/28/2010.  Findings: The cardiac silhouette, mediastinal and hilar contours are within normal limits and stable.  There are chronic bronchitic type interstitial lung changes without definite acute overlying pulmonary process.  No pleural effusions.  Two views of the right ribs demonstrate no definite acute rib fractures.  No pneumothorax or pleural thickening.  IMPRESSION: No acute cardiopulmonary findings and no definite right-sided rib fractures.   Original Report Authenticated By: Rudie Meyer, M.D.      Date: 03/23/2012  Rate: 55  Rhythm: normal sinus rhythm  QRS Axis: left  Intervals: normal  ST/T Wave abnormalities: normal  Conduction Disutrbances:none  Narrative Interpretation: Low voltage, left axis deviation. When compared with ECG of 10/20/2011, no significant changes are seen.  Old EKG Reviewed: unchanged    1. Upper back pain on right side       MDM  Upper back pain which seems musculoskeletal. Point tenderness is suggestive of rib fracture and x-rays will be obtained.  X-rays are negative. He feels much better after hydromrphone. He will be sent home with prescriptions for Percocet and Orphenadrine.     Dione Booze, MD 03/23/12 [phone]

## 2012-03-23 NOTE — ED Notes (Signed)
Pt reports he is starting to feel a little better.

## 2012-03-28 ENCOUNTER — Encounter: Payer: Self-pay | Admitting: Internal Medicine

## 2012-03-28 ENCOUNTER — Ambulatory Visit (INDEPENDENT_AMBULATORY_CARE_PROVIDER_SITE_OTHER): Payer: Medicare Other | Admitting: Internal Medicine

## 2012-03-28 VITALS — BP 130/80 | HR 80 | Temp 96.7°F | Resp 16 | Wt 163.0 lb

## 2012-03-28 DIAGNOSIS — R112 Nausea with vomiting, unspecified: Secondary | ICD-10-CM | POA: Diagnosis not present

## 2012-03-28 MED ORDER — ORPHENADRINE CITRATE ER 100 MG PO TB12: 100.0000 mg | ORAL_TABLET | Freq: Two times a day (BID) | ORAL | Status: DC

## 2012-03-28 NOTE — Progress Notes (Signed)
   Subjective:     HPI  C/o painful spasms in the L chest wall under shoulder pain since 2/20 - bad. He is better - taking a spasm reliever. He threw up w/percocet The patient presents for a follow-up of  chronic hypertension, chronic dyslipidemia, CAD controlled with medicines  Wt Readings from Last 3 Encounters:  03/28/12 163 lb (73.936 kg)  02/09/12 167 lb (75.751 kg)  10/20/11 162 lb (73.483 kg)   BP Readings from Last 3 Encounters:  03/28/12 130/80  03/23/12 157/70  02/09/12 128/72      Review of Systems  Constitutional: Negative for appetite change, fatigue and unexpected weight change.  HENT: Negative for nosebleeds, congestion, sore throat, sneezing, trouble swallowing and neck pain.   Eyes: Negative for itching and visual disturbance.  Respiratory: Negative for cough.   Cardiovascular: Negative for chest pain, palpitations and leg swelling.  Gastrointestinal: Negative for nausea, diarrhea, blood in stool and abdominal distention.  Genitourinary: Negative for frequency and hematuria.  Musculoskeletal: Negative for back pain, joint swelling and gait problem.  Skin: Positive for rash.  Neurological: Negative for dizziness, tremors, speech difficulty and weakness.  Psychiatric/Behavioral: Negative for sleep disturbance, dysphoric mood and agitation. The patient is not nervous/anxious.        Objective:   Physical Exam  Constitutional: He is oriented to person, place, and time. He appears well-developed.  HENT:  Mouth/Throat: Oropharynx is clear and moist.  Eyes: Conjunctivae are normal. Pupils are equal, round, and reactive to light.  Neck: Normal range of motion. No JVD present. No thyromegaly present.  Cardiovascular: Normal rate, regular rhythm, normal heart sounds and intact distal pulses.  Exam reveals no gallop and no friction rub.   No murmur heard. Pulmonary/Chest: Effort normal and breath sounds normal. No respiratory distress. He has no wheezes. He has no  rales. He exhibits no tenderness.  Abdominal: Soft. Bowel sounds are normal. He exhibits no distension and no mass. There is no tenderness. There is no rebound and no guarding.  Musculoskeletal: Normal range of motion. He exhibits no edema and no tenderness.  Lymphadenopathy:    He has no cervical adenopathy.  Neurological: He is alert and oriented to person, place, and time. He has normal reflexes. No cranial nerve deficit. He exhibits normal muscle tone. Coordination normal.  Skin: Skin is warm and dry. Rash (AKs, SKs) noted.  Psychiatric: He has a normal mood and affect. His behavior is normal. Judgment and thought content normal.   Spastic L mid-thor muscle band is tender  Lab Results  Component Value Date   WBC 7.7 09/29/2011   HGB 14.0 09/29/2011   HCT 42.5 09/29/2011   PLT 204.0 09/29/2011   GLUCOSE 122* 09/29/2011   CHOL 115 01/17/2012   TRIG 60.0 01/17/2012   HDL 63.60 01/17/2012   LDLCALC 39 01/17/2012   ALT 18 01/17/2012   AST 26 01/17/2012   NA 132* 09/29/2011   K 5.0 09/29/2011   CL 100 09/29/2011   CREATININE 1.2 09/29/2011   BUN 25* 09/29/2011   CO2 25 09/29/2011   TSH 4.60 04/15/2009   PSA 4.47* 05/25/2011   HGBA1C 6.4 05/12/2010        Assessment & Plan:

## 2012-03-28 NOTE — Assessment & Plan Note (Signed)
D/c percocet

## 2012-04-25 DIAGNOSIS — L819 Disorder of pigmentation, unspecified: Secondary | ICD-10-CM | POA: Diagnosis not present

## 2012-04-25 DIAGNOSIS — Z85828 Personal history of other malignant neoplasm of skin: Secondary | ICD-10-CM | POA: Diagnosis not present

## 2012-04-25 DIAGNOSIS — D239 Other benign neoplasm of skin, unspecified: Secondary | ICD-10-CM | POA: Diagnosis not present

## 2012-04-25 DIAGNOSIS — L820 Inflamed seborrheic keratosis: Secondary | ICD-10-CM | POA: Diagnosis not present

## 2012-04-25 DIAGNOSIS — L821 Other seborrheic keratosis: Secondary | ICD-10-CM | POA: Diagnosis not present

## 2012-04-25 DIAGNOSIS — L570 Actinic keratosis: Secondary | ICD-10-CM | POA: Diagnosis not present

## 2012-06-27 ENCOUNTER — Emergency Department (HOSPITAL_COMMUNITY): Payer: Medicare Other

## 2012-06-27 ENCOUNTER — Emergency Department (HOSPITAL_COMMUNITY)
Admission: EM | Admit: 2012-06-27 | Discharge: 2012-06-27 | Disposition: A | Discharge status: Home / Self Care | Payer: Medicare Other | Attending: Emergency Medicine | Admitting: Emergency Medicine

## 2012-06-27 ENCOUNTER — Encounter (HOSPITAL_COMMUNITY): Payer: Self-pay | Admitting: Radiology

## 2012-06-27 DIAGNOSIS — M1990 Unspecified osteoarthritis, unspecified site: Secondary | ICD-10-CM | POA: Diagnosis not present

## 2012-06-27 DIAGNOSIS — I10 Essential (primary) hypertension: Secondary | ICD-10-CM | POA: Insufficient documentation

## 2012-06-27 DIAGNOSIS — Z79899 Other long term (current) drug therapy: Secondary | ICD-10-CM | POA: Diagnosis not present

## 2012-06-27 DIAGNOSIS — E785 Hyperlipidemia, unspecified: Secondary | ICD-10-CM | POA: Insufficient documentation

## 2012-06-27 DIAGNOSIS — Z7982 Long term (current) use of aspirin: Secondary | ICD-10-CM | POA: Insufficient documentation

## 2012-06-27 DIAGNOSIS — I2510 Atherosclerotic heart disease of native coronary artery without angina pectoris: Secondary | ICD-10-CM | POA: Insufficient documentation

## 2012-06-27 DIAGNOSIS — Z7902 Long term (current) use of antithrombotics/antiplatelets: Secondary | ICD-10-CM | POA: Insufficient documentation

## 2012-06-27 DIAGNOSIS — Z880 Allergy status to penicillin: Secondary | ICD-10-CM | POA: Diagnosis not present

## 2012-06-27 DIAGNOSIS — D696 Thrombocytopenia, unspecified: Secondary | ICD-10-CM | POA: Diagnosis not present

## 2012-06-27 DIAGNOSIS — Z87448 Personal history of other diseases of urinary system: Secondary | ICD-10-CM | POA: Diagnosis not present

## 2012-06-27 DIAGNOSIS — R0789 Other chest pain: Secondary | ICD-10-CM

## 2012-06-27 DIAGNOSIS — R079 Chest pain, unspecified: Secondary | ICD-10-CM | POA: Diagnosis not present

## 2012-06-27 LAB — BASIC METABOLIC PANEL
BUN: 19 mg/dL (ref 6–23)
CO2: 20 mEq/L (ref 19–32)
Calcium: 9.3 mg/dL (ref 8.4–10.5)
Chloride: 101 mEq/L (ref 96–112)
Creatinine, Ser: 1.1 mg/dL (ref 0.50–1.35)
GFR calc Af Amer: 68 mL/min — ABNORMAL LOW (ref >90)
GFR calc non Af Amer: 59 mL/min — ABNORMAL LOW (ref >90)
Glucose, Bld: 119 mg/dL — ABNORMAL HIGH (ref 70–99)
Potassium: 4 mEq/L (ref 3.5–5.1)
Sodium: 135 mEq/L (ref 135–145)

## 2012-06-27 LAB — CBC
HCT: 42.2 % (ref 39.0–52.0)
Hemoglobin: 14.6 g/dL (ref 13.0–17.0)
MCH: 31.4 pg (ref 26.0–34.0)
MCHC: 34.6 g/dL (ref 30.0–36.0)
MCV: 90.8 fL (ref 78.0–100.0)
Platelets: 190 10*3/uL (ref 150–400)
RBC: 4.65 MIL/uL (ref 4.22–5.81)
RDW: 13.8 % (ref 11.5–15.5)
WBC: 7.6 10*3/uL (ref 4.0–10.5)

## 2012-06-27 LAB — POCT I-STAT TROPONIN I
Troponin i, poc: 0.01 ng/mL (ref 0.00–0.08)
Troponin i, poc: 0.02 ng/mL (ref 0.00–0.08)

## 2012-06-27 MED ORDER — ACETAMINOPHEN 325 MG PO TABS
650.0000 mg | ORAL_TABLET | Freq: Once | ORAL | Status: AC
  Administered 2012-06-27: 650 mg via ORAL
  Filled 2012-06-27: qty 2

## 2012-06-27 NOTE — Discharge Instructions (Signed)
Chest Pain (Nonspecific) °It is often hard to give a specific diagnosis for the cause of chest pain. There is always a chance that your pain could be related to something serious, such as a heart attack or a blood clot in the lungs. You need to follow up with your caregiver for further evaluation. °CAUSES  °· Heartburn. °· Pneumonia or bronchitis. °· Anxiety or stress. °· Inflammation around your heart (pericarditis) or lung (pleuritis or pleurisy). °· A blood clot in the lung. °· A collapsed lung (pneumothorax). It can develop suddenly on its own (spontaneous pneumothorax) or from injury (trauma) to the chest. °· Shingles infection (herpes zoster virus). °The chest wall is composed of bones, muscles, and cartilage. Any of these can be the source of the pain. °· The bones can be bruised by injury. °· The muscles or cartilage can be strained by coughing or overwork. °· The cartilage can be affected by inflammation and become sore (costochondritis). °DIAGNOSIS  °Lab tests or other studies, such as X-rays, electrocardiography, stress testing, or cardiac imaging, may be needed to find the cause of your pain.  °TREATMENT  °· Treatment depends on what may be causing your chest pain. Treatment may include: °· Acid blockers for heartburn. °· Anti-inflammatory medicine. °· Pain medicine for inflammatory conditions. °· Antibiotics if an infection is present. °· You may be advised to change lifestyle habits. This includes stopping smoking and avoiding alcohol, caffeine, and chocolate. °· You may be advised to keep your head raised (elevated) when sleeping. This reduces the chance of acid going backward from your stomach into your esophagus. °· Most of the time, nonspecific chest pain will improve within 2 to 3 days with rest and mild pain medicine. °HOME CARE INSTRUCTIONS  °· If antibiotics were prescribed, take your antibiotics as directed. Finish them even if you start to feel better. °· For the next few days, avoid physical  activities that bring on chest pain. Continue physical activities as directed. °· Do not smoke. °· Avoid drinking alcohol. °· Only take over-the-counter or prescription medicine for pain, discomfort, or fever as directed by your caregiver. °· Follow your caregiver's suggestions for further testing if your chest pain does not go away. °· Keep any follow-up appointments you made. If you do not go to an appointment, you could develop lasting (chronic) problems with pain. If there is any problem keeping an appointment, you must call to reschedule. °SEEK MEDICAL CARE IF:  °· You think you are having problems from the medicine you are taking. Read your medicine instructions carefully. °· Your chest pain does not go away, even after treatment. °· You develop a rash with blisters on your chest. °SEEK IMMEDIATE MEDICAL CARE IF:  °· You have increased chest pain or pain that spreads to your arm, neck, jaw, back, or abdomen. °· You develop shortness of breath, an increasing cough, or you are coughing up blood. °· You have severe back or abdominal pain, feel nauseous, or vomit. °· You develop severe weakness, fainting, or chills. °· You have a fever. °THIS IS AN EMERGENCY. Do not wait to see if the pain will go away. Get medical help at once. Call your local emergency services (911 in [place] Do not drive yourself to the hospital. °MAKE SURE YOU:  °· Understand these instructions. °· Will watch your condition. °· Will get help right away if you are not doing well or get worse. °Document Released: 10/28/2004 Document Revised: 04/12/2011 Document Reviewed: 08/24/2007 °[employer]® Patient Information ©2014 [employer]   [employer]. ° °

## 2012-06-27 NOTE — ED Provider Notes (Signed)
History     CSN: [id number]  Arrival date & time 06/27/12  7829   First MD Initiated Contact with Patient 06/27/12 [phone]      Chief Complaint  Patient presents with  . Chest Pain    (Consider location/radiation/quality/duration/timing/severity/associated sxs/prior treatment) Patient is a 79 y.o. male presenting with chest pain. The history is provided by the patient.  Chest Pain Pain location:  L chest Pain quality: sharp and shooting   Pain radiates to:  Does not radiate Pain radiates to the back: no   Pain severity:  Severe Duration: A few seconds. Timing:  Intermittent Progression:  Unchanged Chronicity:  New Context: at rest   Context: not breathing, no drug use, not eating, no intercourse, not lifting, no movement, not raising an arm, no stress and no trauma   Relieved by:  Nothing Worsened by:  Nothing tried Ineffective treatments:  None tried Associated symptoms: no abdominal pain, no back pain, no cough, no diaphoresis, no dizziness, no fever, no heartburn, no nausea, no near-syncope, no numbness, no shortness of breath, not vomiting and no weakness   Risk factors: coronary artery disease     Past Medical History  Diagnosis Date  . Coronary atherosclerosis of unspecified type of vessel, native or graft   . Unspecified essential hypertension   . Other and unspecified hyperlipidemia   . Hypertrophy of prostate without urinary obstruction and other lower urinary tract symptoms (LUTS)   . Thrombocytopenia, unspecified   . Osteoarthrosis, unspecified whether generalized or localized, unspecified site   . Dizziness and giddiness     Past Surgical History  Procedure Laterality Date  . Colonoscopy    . Hip arthroplasty    . Transurethral resection of prostate      Family History  Problem Relation Age of Onset  . Coronary artery disease Other   . Heart disease Father     History  Substance Use Topics  . Smoking status: Never Smoker   . Smokeless tobacco:  Not on file  . Alcohol Use: No      Review of Systems  Constitutional: Negative for fever and diaphoresis.  Respiratory: Negative for cough and shortness of breath.   Cardiovascular: Positive for chest pain. Negative for near-syncope.  Gastrointestinal: Negative for heartburn, nausea, vomiting and abdominal pain.  Musculoskeletal: Negative for back pain.  Neurological: Negative for dizziness, weakness and numbness.  All other systems reviewed and are negative.    Allergies  Oxycodone and Penicillins  Home Medications   Current Outpatient Rx  Name  Route  Sig  Dispense  Refill  . aspirin 81 MG EC tablet   Oral   Take 81 mg by mouth daily.           [name] b complex vitamins tablet   Oral   Take 1 tablet by mouth daily.           . carvedilol (COREG) 6.25 MG tablet   Oral   Take 6.25 mg by mouth 2 (two) times daily with a meal.         . Cholecalciferol 1000 UNITS tablet   Oral   Take 1,000 Units by mouth daily.           . clopidogrel (PLAVIX) 75 MG tablet   Oral   Take 75 mg by mouth daily.         . Glucosamine-Chondroit-Vit C-Mn (GLUCOSAMINE 1500 COMPLEX PO)   Oral   Take 1 tablet by mouth 2 (two) times daily.          [name]  lisinopril (PRINIVIL,ZESTRIL) 20 MG tablet   Oral   Take 10 mg by mouth 2 (two) times daily.         . simvastatin (ZOCOR) 80 MG tablet   Oral   Take 80 mg by mouth at bedtime.         . nitroGLYCERIN (NITROSTAT) 0.4 MG SL tablet   Sublingual   Place 0.4 mg under the tongue every 5 (five) minutes as needed for chest pain.           BP 117/55  Pulse 53  Temp(Src) 97.7 F (36.5 C) (Oral)  Resp 10  SpO2 96%  Physical Exam  Nursing note and vitals reviewed. Constitutional: He appears well-developed and well-nourished. No distress.  HENT:  Head: Normocephalic and atraumatic.  Mouth/Throat: Oropharynx is clear and moist.  Eyes: Conjunctivae and EOM are normal. No scleral icterus.  Neck: Normal range of motion. Neck  supple. No JVD present.  Cardiovascular: Normal rate, regular rhythm and intact distal pulses.   No murmur heard. Pulmonary/Chest: Effort normal. No respiratory distress. He has no wheezes. He has no rales.  Abdominal: He exhibits no distension. There is no tenderness.  Musculoskeletal: He exhibits no edema.  Lymphadenopathy:    He has no cervical adenopathy.  Neurological: He is alert. He exhibits normal muscle tone. Coordination normal.  Skin: Skin is warm and dry. He is not diaphoretic.    ED Course  Procedures (including critical care time)  Labs Reviewed  BASIC METABOLIC PANEL - Abnormal; Notable for the following:    Glucose, Bld 119 (*)    GFR calc non Af Amer 59 (*)    GFR calc Af Amer 68 (*)    All other components within normal limits  CBC  POCT I-STAT TROPONIN I  POCT I-STAT TROPONIN I   Dg Chest Port 1 View  06/27/2012   *RADIOLOGY REPORT*  Clinical Data: Chest pain, history coronary artery disease post MI and stenting, hypertension, hyperlipidemia  PORTABLE CHEST - 1 VIEW  Comparison: Portable exam 0818 hours compared to 03/23/2012  Findings: Minimal enlargement of cardiac silhouette. Calcified tortuous aorta. Pulmonary vascularity normal. Lungs clear. No pleural effusion or pneumothorax. Slight elevation of left diaphragm noted.  IMPRESSION: No acute abnormalities. Minimal enlargement of cardiac silhouette.   Original Report Authenticated By: Ulyses Southward, M.D.     1. Atypical chest pain     Room air saturation is 98% I interpret this to be normal.   EKG at time 08:05, shows sinus rhythm at a rate of 52, incomplete right bundle branch block, poor R-wave progression noted in lead V3, no ST or T-wave abnormalities. EKG is similar to that obtained on 03/23/2012. Interpretation is borderline ECG with no significant new ischemic changes.   10:58 AM Serial troponins are neg.  Pt has had no sig recurrences.  Feels fine currently.  Will d/c home, encourage PCP and  cardiology follow up within the week.   MDM   Patient with history of coronary disease, however extremely atypical chest pain that is sharp, lasting only one or 2 seconds, intermittently since this morning. Is not associated with sweating, nausea, shortness of breath, dizziness. Given age and prior history of coronary disease, will observe the patient, round serial troponins, and treats discomfort.        [name]. Jashun Puertas, MD 06/27/12 1058

## 2012-06-27 NOTE — ED Notes (Signed)
MD at bedside. 

## 2012-06-27 NOTE — ED Notes (Signed)
Pt. sts unable to find ride- RN attempted to find neighbors phone number with no success. Tresa Endo moon- family friend to be made aware per pt. Request.

## 2012-06-27 NOTE — ED Notes (Signed)
Condition acute. Condition made better by nothing. Made worse by nothing

## 2012-06-27 NOTE — ED Notes (Signed)
Pt presents with CP while laying down in bed.  Pt is from home pt describes pain as "stabbing" . Pt denies  N/V,. SHOB, Diaphoresis Pt has had  nitro sublingual x 2. 324 of ASA

## 2012-07-17 ENCOUNTER — Other Ambulatory Visit: Payer: Self-pay | Admitting: *Deleted

## 2012-07-17 MED ORDER — NITROGLYCERIN 0.4 MG SL SUBL: 0.4000 mg | SUBLINGUAL_TABLET | SUBLINGUAL | Status: DC | PRN

## 2012-08-02 ENCOUNTER — Encounter: Payer: Self-pay | Admitting: Internal Medicine

## 2012-08-02 ENCOUNTER — Ambulatory Visit (INDEPENDENT_AMBULATORY_CARE_PROVIDER_SITE_OTHER): Payer: Medicare Other | Admitting: Internal Medicine

## 2012-08-02 VITALS — BP 110/72 | HR 80 | Temp 97.5°F | Resp 16 | Ht 68.0 in | Wt 158.0 lb

## 2012-08-02 DIAGNOSIS — R739 Hyperglycemia, unspecified: Secondary | ICD-10-CM

## 2012-08-02 DIAGNOSIS — I25810 Atherosclerosis of coronary artery bypass graft(s) without angina pectoris: Secondary | ICD-10-CM

## 2012-08-02 DIAGNOSIS — R7309 Other abnormal glucose: Secondary | ICD-10-CM

## 2012-08-02 DIAGNOSIS — Z0000 Encounter for general adult medical examination without abnormal findings: Secondary | ICD-10-CM | POA: Diagnosis not present

## 2012-08-02 DIAGNOSIS — N320 Bladder-neck obstruction: Secondary | ICD-10-CM

## 2012-08-02 NOTE — Progress Notes (Signed)
   Subjective:    HPI  The patient is here for a wellness exam. The patient has been doing well overall without major physical or psychological issues going on lately. The patient presents for a follow-up of  chronic hypertension, chronic dyslipidemia, CAD controlled with medicines  Wt Readings from Last 3 Encounters:  08/02/12 158 lb (71.668 kg)  03/28/12 163 lb (73.936 kg)  02/09/12 167 lb (75.751 kg)   BP Readings from Last 3 Encounters:  08/02/12 110/72  06/27/12 154/87  03/28/12 130/80      Review of Systems  Constitutional: Negative for appetite change, fatigue and unexpected weight change.  HENT: Negative for nosebleeds, congestion, sore throat, sneezing, trouble swallowing and neck pain.   Eyes: Negative for itching and visual disturbance.  Respiratory: Negative for cough.   Cardiovascular: Negative for chest pain, palpitations and leg swelling.  Gastrointestinal: Negative for nausea, diarrhea, blood in stool and abdominal distention.  Genitourinary: Negative for frequency and hematuria.  Musculoskeletal: Negative for back pain, joint swelling and gait problem.  Skin: Positive for rash.  Neurological: Negative for dizziness, tremors, speech difficulty and weakness.  Psychiatric/Behavioral: Negative for sleep disturbance, dysphoric mood and agitation. The patient is not nervous/anxious.        Objective:   Physical Exam  Constitutional: He is oriented to person, place, and time. He appears well-developed.  HENT:  Mouth/Throat: Oropharynx is clear and moist.  Eyes: Conjunctivae are normal. Pupils are equal, round, and reactive to light.  Neck: Normal range of motion. No JVD present. No thyromegaly present.  Cardiovascular: Normal rate, regular rhythm, normal heart sounds and intact distal pulses.  Exam reveals no gallop and no friction rub.   No murmur heard. Pulmonary/Chest: Effort normal and breath sounds normal. No respiratory distress. He has no wheezes. He has  no rales. He exhibits no tenderness.  Abdominal: Soft. Bowel sounds are normal. He exhibits no distension and no mass. There is no tenderness. There is no rebound and no guarding.  Musculoskeletal: Normal range of motion. He exhibits no edema and no tenderness.  Lymphadenopathy:    He has no cervical adenopathy.  Neurological: He is alert and oriented to person, place, and time. He has normal reflexes. No cranial nerve deficit. He exhibits normal muscle tone. Coordination normal.  Skin: Skin is warm and dry. Rash (AKs, SKs) noted.  Psychiatric: He has a normal mood and affect. His behavior is normal. Judgment and thought content normal.     Lab Results  Component Value Date   WBC 7.6 06/27/2012   HGB 14.6 06/27/2012   HCT 42.2 06/27/2012   PLT 190 06/27/2012   GLUCOSE 119* 06/27/2012   CHOL 115 01/17/2012   TRIG 60.0 01/17/2012   HDL 63.60 01/17/2012   LDLCALC 39 01/17/2012   ALT 18 01/17/2012   AST 26 01/17/2012   NA 135 06/27/2012   K 4.0 06/27/2012   CL 101 06/27/2012   CREATININE 1.10 06/27/2012   BUN 19 06/27/2012   CO2 20 06/27/2012   TSH 4.60 04/15/2009   PSA 4.47* 05/25/2011   HGBA1C 6.4 05/12/2010        Assessment & Plan:

## 2012-08-02 NOTE — Assessment & Plan Note (Signed)

## 2012-08-15 ENCOUNTER — Encounter: Payer: Medicare Other | Admitting: Internal Medicine

## 2012-09-01 DIAGNOSIS — L821 Other seborrheic keratosis: Secondary | ICD-10-CM | POA: Diagnosis not present

## 2012-09-01 DIAGNOSIS — D1801 Hemangioma of skin and subcutaneous tissue: Secondary | ICD-10-CM | POA: Diagnosis not present

## 2012-09-01 DIAGNOSIS — Z85828 Personal history of other malignant neoplasm of skin: Secondary | ICD-10-CM | POA: Diagnosis not present

## 2012-09-01 DIAGNOSIS — L723 Sebaceous cyst: Secondary | ICD-10-CM | POA: Diagnosis not present

## 2012-09-01 DIAGNOSIS — L820 Inflamed seborrheic keratosis: Secondary | ICD-10-CM | POA: Diagnosis not present

## 2012-09-06 ENCOUNTER — Other Ambulatory Visit: Payer: Self-pay | Admitting: Cardiovascular Disease

## 2012-10-04 ENCOUNTER — Other Ambulatory Visit: Payer: Self-pay | Admitting: Cardiovascular Disease

## 2012-10-09 ENCOUNTER — Other Ambulatory Visit: Payer: Self-pay | Admitting: Internal Medicine

## 2012-10-16 ENCOUNTER — Ambulatory Visit (INDEPENDENT_AMBULATORY_CARE_PROVIDER_SITE_OTHER): Payer: Medicare Other | Admitting: Cardiovascular Disease

## 2012-10-16 ENCOUNTER — Encounter: Payer: Self-pay | Admitting: Cardiovascular Disease

## 2012-10-16 VITALS — BP 148/81 | HR 60 | Ht 68.0 in | Wt 159.0 lb

## 2012-10-16 DIAGNOSIS — I2510 Atherosclerotic heart disease of native coronary artery without angina pectoris: Secondary | ICD-10-CM | POA: Diagnosis not present

## 2012-10-16 NOTE — Progress Notes (Signed)
History of Present Illness: 79 yo WM with h/o CAD s/p posterior inferior MI 2002 with placement of a bare metal stent in the circumflex artery and subsequent unstable angina August 2007 with placement of drug eluting stent in the mid LAD , also history of HTN, hyperlipidemia who is here today for follow up. He was admitted to [facility] 09/29/10 for chest pain. Cardiac enzymes were negative. Stress myoview as outpt 10/07/10 without ischemia. I last saw him in September 2013 for follow up. He was seen in the ED at [facility] May 2014 with atypical chest pain.   He is here today for follow up. He has had no chest pain or SOB. No palpitations, dizziness, near syncope or syncope. He has been active. He is playing golf every Thursday. He works out at [employer] 3 days per week.   Primary Care Physician: Plotnikov  Last Lipid Profile:Lipid Panel     Component Value Date/Time   CHOL 115 01/17/2012 0925   TRIG 60.0 01/17/2012 0925   HDL 63.60 01/17/2012 0925   CHOLHDL 2 01/17/2012 0925   VLDL 12.0 01/17/2012 0925   LDLCALC 39 01/17/2012 0925    Past Medical History  Diagnosis Date  . Coronary atherosclerosis of unspecified type of vessel, native or graft   . Unspecified essential hypertension   . Other and unspecified hyperlipidemia   . Hypertrophy of prostate without urinary obstruction and other lower urinary tract symptoms (LUTS)   . Thrombocytopenia, unspecified   . Osteoarthrosis, unspecified whether generalized or localized, unspecified site   . Dizziness and giddiness     Past Surgical History  Procedure Laterality Date  . Colonoscopy    . Hip arthroplasty    . Transurethral resection of prostate      Current Outpatient Prescriptions  Medication Sig Dispense Refill  . aspirin 81 MG EC tablet Take 81 mg by mouth daily.        [name] b complex vitamins tablet Take 1 tablet by mouth daily.        . carvedilol (COREG) 6.25 MG tablet Take 6.25 mg by mouth 2 (two) times daily with a  meal.      . Cholecalciferol 1000 UNITS tablet Take 1,000 Units by mouth daily.        . clopidogrel (PLAVIX) 75 MG tablet TAKE 1 TABLET BY MOUTH EVERY DAY  30 tablet  6  . Glucosamine-Chondroit-Vit C-Mn (GLUCOSAMINE 1500 COMPLEX PO) Take 1 tablet by mouth 2 (two) times daily.       [name] lisinopril (PRINIVIL,ZESTRIL) 20 MG tablet TAKE 1/2 TAB TWICE A DAY  30 tablet  1  . nitroGLYCERIN (NITROSTAT) 0.4 MG SL tablet Place 1 tablet (0.4 mg total) under the tongue every 5 (five) minutes as needed for chest pain.  25 tablet  6  . simvastatin (ZOCOR) 80 MG tablet TAKE 1 TABLET BY MOUTH AT BEDTIME  30 tablet  10   No current facility-administered medications for this visit.    Allergies  Allergen Reactions  . Oxycodone Nausea And Vomiting  . Penicillins Hives    History   Social History  . Marital Status: Married    Spouse Name: N/A    Number of Children: N/A  . Years of Education: N/A   Occupational History  . Retired    Social History Main Topics  . Smoking status: Never Smoker   . Smokeless tobacco: Not on file  . Alcohol Use: No  . Drug Use: No  . Sexual  Activity: Not Currently   Other Topics Concern  . Not on file   Social History Narrative   Regular Exercise -  YES          Family History  Problem Relation Age of Onset  . Coronary artery disease Other   . Heart disease Father     Review of Systems:  As stated in the HPI and otherwise negative.   BP 148/81  Pulse 60  Ht 5\' 8"  (1.727 m)  Wt 159 lb (72.122 kg)  BMI 24.18 kg/m2  Physical Examination: General: Well developed, well nourished, NAD HEENT: OP clear, mucus membranes moist SKIN: warm, dry. No rashes. Neuro: No focal deficits Musculoskeletal: Muscle strength 5/5 all ext Psychiatric: Mood and affect normal Neck: No JVD, no carotid bruits, no thyromegaly, no lymphadenopathy. Lungs:Clear bilaterally, no wheezes, rhonci, crackles Cardiovascular: Huston Foley, regular rhythm. No murmurs, gallops or  rubs. Abdomen:Soft. Bowel sounds present. Non-tender.  Extremities: No lower extremity edema. Pulses are 2 + in the bilateral DP/PT.  Assessment and Plan:   1. CORONARY ARTERY DISEASE: Stable. Continue ASA, beta blocker, Ace-inh, statin.    2. HYPERTENSION: BP well controlled. No changes

## 2012-10-16 NOTE — Patient Instructions (Addendum)
Your physician wants you to follow-up in:  6 months. You will receive a reminder letter in the mail two months in advance. If you don't receive a letter, please call our office to schedule the follow-up appointment.   

## 2012-12-05 ENCOUNTER — Ambulatory Visit (INDEPENDENT_AMBULATORY_CARE_PROVIDER_SITE_OTHER): Payer: Medicare Other | Admitting: Internal Medicine

## 2012-12-05 ENCOUNTER — Encounter: Payer: Self-pay | Admitting: Internal Medicine

## 2012-12-05 ENCOUNTER — Other Ambulatory Visit (INDEPENDENT_AMBULATORY_CARE_PROVIDER_SITE_OTHER): Payer: Medicare Other

## 2012-12-05 VITALS — BP 116/64 | HR 64 | Temp 97.2°F | Resp 16 | Wt 160.0 lb

## 2012-12-05 DIAGNOSIS — R7309 Other abnormal glucose: Secondary | ICD-10-CM

## 2012-12-05 DIAGNOSIS — Z0000 Encounter for general adult medical examination without abnormal findings: Secondary | ICD-10-CM | POA: Diagnosis not present

## 2012-12-05 DIAGNOSIS — R972 Elevated prostate specific antigen [PSA]: Secondary | ICD-10-CM

## 2012-12-05 DIAGNOSIS — I2510 Atherosclerotic heart disease of native coronary artery without angina pectoris: Secondary | ICD-10-CM

## 2012-12-05 DIAGNOSIS — E785 Hyperlipidemia, unspecified: Secondary | ICD-10-CM | POA: Diagnosis not present

## 2012-12-05 DIAGNOSIS — N320 Bladder-neck obstruction: Secondary | ICD-10-CM | POA: Diagnosis not present

## 2012-12-05 DIAGNOSIS — I10 Essential (primary) hypertension: Secondary | ICD-10-CM | POA: Diagnosis not present

## 2012-12-05 DIAGNOSIS — I25810 Atherosclerosis of coronary artery bypass graft(s) without angina pectoris: Secondary | ICD-10-CM

## 2012-12-05 DIAGNOSIS — R739 Hyperglycemia, unspecified: Secondary | ICD-10-CM

## 2012-12-05 LAB — HEPATIC FUNCTION PANEL
ALT: 20 U/L (ref 0–53)
AST: 28 U/L (ref 0–37)
Albumin: 3.9 g/dL (ref 3.5–5.2)
Alkaline Phosphatase: 58 U/L (ref 39–117)
Bilirubin, Direct: 0.2 mg/dL (ref 0.0–0.3)
Total Bilirubin: 1.2 mg/dL (ref 0.3–1.2)
Total Protein: 7 g/dL (ref 6.0–8.3)

## 2012-12-05 LAB — URINALYSIS
Bilirubin Urine: NEGATIVE
Hgb urine dipstick: NEGATIVE
Leukocytes, UA: NEGATIVE
Nitrite: NEGATIVE
Specific Gravity, Urine: 1.02
Total Protein, Urine: NEGATIVE
Urine Glucose: NEGATIVE
Urobilinogen, UA: 0.2
pH: 6 (ref 5.0–8.0)

## 2012-12-05 LAB — BASIC METABOLIC PANEL
BUN: 15 mg/dL (ref 6–23)
CO2: 30 mEq/L (ref 19–32)
Calcium: 9.3 mg/dL (ref 8.4–10.5)
Chloride: 99 mEq/L (ref 96–112)
Creatinine, Ser: 1.2 mg/dL (ref 0.4–1.5)
GFR: 63.26 mL/min (ref >60.00)
Glucose, Bld: 71 mg/dL (ref 70–99)
Potassium: 5.2 mEq/L — ABNORMAL HIGH (ref 3.5–5.1)
Sodium: 134 mEq/L — ABNORMAL LOW (ref 135–145)

## 2012-12-05 LAB — CBC WITH DIFFERENTIAL/PLATELET
Basophils Absolute: 0 10*3/uL (ref 0.0–0.1)
Basophils Relative: 0.3 % (ref 0.0–3.0)
Eosinophils Absolute: 0.3 10*3/uL (ref 0.0–0.7)
Eosinophils Relative: 4.1 % (ref 0.0–5.0)
HCT: 42.6 % (ref 39.0–52.0)
Hemoglobin: 14.6 g/dL (ref 13.0–17.0)
Lymphocytes Relative: 23.1 % (ref 12.0–46.0)
Lymphs Abs: 1.9 10*3/uL (ref 0.7–4.0)
MCHC: 34.2 g/dL (ref 30.0–36.0)
MCV: 91.6 fl (ref 78.0–100.0)
Monocytes Absolute: 1 10*3/uL (ref 0.1–1.0)
Monocytes Relative: 12.3 % — ABNORMAL HIGH (ref 3.0–12.0)
Neutro Abs: 4.9 10*3/uL (ref 1.4–7.7)
Neutrophils Relative %: 60.2 % (ref 43.0–77.0)
Platelets: 223 10*3/uL (ref 150.0–400.0)
RBC: 4.65 Mil/uL (ref 4.22–5.81)
RDW: 14.4 % (ref 11.5–14.6)
WBC: 8.2 10*3/uL (ref 4.5–10.5)

## 2012-12-05 LAB — LIPID PANEL
Cholesterol: 124 mg/dL (ref 0–200)
HDL: 66.9 mg/dL (ref >39.00)
LDL Cholesterol: 46 mg/dL (ref 0–99)
Total CHOL/HDL Ratio: 2
Triglycerides: 58 mg/dL (ref 0.0–149.0)
VLDL: 11.6 mg/dL (ref 0.0–40.0)

## 2012-12-05 LAB — HEMOGLOBIN A1C: Hgb A1c MFr Bld: 6.4 % (ref 4.6–6.5)

## 2012-12-05 LAB — TSH: TSH: 2.84 u[IU]/mL (ref 0.35–5.50)

## 2012-12-05 LAB — PSA: PSA: 7.6 ng/mL — ABNORMAL HIGH (ref 0.10–4.00)

## 2012-12-05 NOTE — Progress Notes (Signed)
Pre-visit discussion using our clinic review tool. No additional management support is needed unless otherwise documented below in the visit note.  

## 2012-12-05 NOTE — Assessment & Plan Note (Signed)
Continue with current prescription therapy as reflected on the Med list.  

## 2012-12-05 NOTE — Assessment & Plan Note (Signed)
PSA

## 2012-12-05 NOTE — Progress Notes (Signed)
   Subjective:    Patient ID: [name], male    DOB: [birth year], 79 y.o.   MRN: [id number]  HPI   The patient presents for a follow-up of  chronic hypertension, chronic dyslipidemia, CAD controlled with medicines  Wt Readings from Last 3 Encounters:  12/05/12 160 lb (72.576 kg)  10/16/12 159 lb (72.122 kg)  08/02/12 158 lb (71.668 kg)   BP Readings from Last 3 Encounters:  12/05/12 116/64  10/16/12 148/81  08/02/12 110/72      Review of Systems  Constitutional: Negative for appetite change, fatigue and unexpected weight change.  HENT: Negative for congestion, nosebleeds, sneezing, sore throat and trouble swallowing.   Eyes: Negative for itching and visual disturbance.  Respiratory: Negative for cough.   Cardiovascular: Negative for chest pain, palpitations and leg swelling.  Gastrointestinal: Negative for nausea, diarrhea, blood in stool and abdominal distention.  Genitourinary: Negative for frequency and hematuria.  Musculoskeletal: Negative for back pain, gait problem, joint swelling and neck pain.  Skin: Positive for rash.  Neurological: Negative for dizziness, tremors, speech difficulty and weakness.  Psychiatric/Behavioral: Negative for sleep disturbance, dysphoric mood and agitation. The patient is not nervous/anxious.        Objective:   Physical Exam  Constitutional: He is oriented to person, place, and time. He appears well-developed.  HENT:  Mouth/Throat: Oropharynx is clear and moist.  Eyes: Conjunctivae are normal. Pupils are equal, round, and reactive to light.  Neck: Normal range of motion. No JVD present. No thyromegaly present.  Cardiovascular: Normal rate, regular rhythm, normal heart sounds and intact distal pulses.  Exam reveals no gallop and no friction rub.   No murmur heard. Pulmonary/Chest: Effort normal and breath sounds normal. No respiratory distress. He has no wheezes. He has no rales. He exhibits no tenderness.  Abdominal: Soft. Bowel  sounds are normal. He exhibits no distension and no mass. There is no tenderness. There is no rebound and no guarding.  Musculoskeletal: Normal range of motion. He exhibits no edema and no tenderness.  Lymphadenopathy:    He has no cervical adenopathy.  Neurological: He is alert and oriented to person, place, and time. He has normal reflexes. No cranial nerve deficit. He exhibits normal muscle tone. Coordination normal.  Skin: Skin is warm and dry. Rash (AKs, SKs) noted.  Psychiatric: He has a normal mood and affect. His behavior is normal. Judgment and thought content normal.     Lab Results  Component Value Date   WBC 7.6 06/27/2012   HGB 14.6 06/27/2012   HCT 42.2 06/27/2012   PLT 190 06/27/2012   GLUCOSE 119* 06/27/2012   CHOL 115 01/17/2012   TRIG 60.0 01/17/2012   HDL 63.60 01/17/2012   LDLCALC 39 01/17/2012   ALT 18 01/17/2012   AST 26 01/17/2012   NA 135 06/27/2012   K 4.0 06/27/2012   CL 101 06/27/2012   CREATININE 1.10 06/27/2012   BUN 19 06/27/2012   CO2 20 06/27/2012   TSH 4.60 04/15/2009   PSA 4.47* 05/25/2011   HGBA1C 6.4 05/12/2010        Assessment & Plan:

## 2012-12-06 ENCOUNTER — Other Ambulatory Visit: Payer: Self-pay | Admitting: Internal Medicine

## 2012-12-06 DIAGNOSIS — R972 Elevated prostate specific antigen [PSA]: Secondary | ICD-10-CM

## 2012-12-09 ENCOUNTER — Other Ambulatory Visit: Payer: Self-pay | Admitting: Internal Medicine

## 2013-02-11 ENCOUNTER — Other Ambulatory Visit: Payer: Self-pay | Admitting: Cardiovascular Disease

## 2013-03-07 ENCOUNTER — Ambulatory Visit (INDEPENDENT_AMBULATORY_CARE_PROVIDER_SITE_OTHER): Payer: Medicare Other | Admitting: Internal Medicine

## 2013-03-07 ENCOUNTER — Encounter: Payer: Self-pay | Admitting: Internal Medicine

## 2013-03-07 VITALS — BP 140/60 | HR 68 | Temp 97.5°F | Resp 16 | Wt 162.0 lb

## 2013-03-07 DIAGNOSIS — Z23 Encounter for immunization: Secondary | ICD-10-CM | POA: Diagnosis not present

## 2013-03-07 NOTE — Progress Notes (Signed)
Pre visit review using our clinic review tool, if applicable. No additional management support is needed unless otherwise documented below in the visit note. 

## 2013-03-07 NOTE — Progress Notes (Signed)
   Subjective:    HPI  The patient presents for a follow-up of  chronic hypertension, chronic dyslipidemia, CAD controlled with medicines  Wt Readings from Last 3 Encounters:  03/07/13 162 lb (73.483 kg)  12/05/12 160 lb (72.576 kg)  10/16/12 159 lb (72.122 kg)   BP Readings from Last 3 Encounters:  03/07/13 140/60  12/05/12 116/64  10/16/12 148/81      Review of Systems  Constitutional: Negative for appetite change, fatigue and unexpected weight change.  HENT: Negative for congestion, nosebleeds, sneezing, sore throat and trouble swallowing.   Eyes: Negative for itching and visual disturbance.  Respiratory: Negative for cough.   Cardiovascular: Negative for chest pain, palpitations and leg swelling.  Gastrointestinal: Negative for nausea, diarrhea, blood in stool and abdominal distention.  Genitourinary: Negative for frequency and hematuria.  Musculoskeletal: Negative for back pain, gait problem, joint swelling and neck pain.  Skin: Positive for rash.  Neurological: Negative for dizziness, tremors, speech difficulty and weakness.  Psychiatric/Behavioral: Negative for sleep disturbance, dysphoric mood and agitation. The patient is not nervous/anxious.        Objective:   Physical Exam  Constitutional: He is oriented to person, place, and time. He appears well-developed.  HENT:  Mouth/Throat: Oropharynx is clear and moist.  Eyes: Conjunctivae are normal. Pupils are equal, round, and reactive to light.  Neck: Normal range of motion. No JVD present. No thyromegaly present.  Cardiovascular: Normal rate, regular rhythm, normal heart sounds and intact distal pulses.  Exam reveals no gallop and no friction rub.   No murmur heard. Pulmonary/Chest: Effort normal and breath sounds normal. No respiratory distress. He has no wheezes. He has no rales. He exhibits no tenderness.  Abdominal: Soft. Bowel sounds are normal. He exhibits no distension and no mass. There is no tenderness.  There is no rebound and no guarding.  Musculoskeletal: Normal range of motion. He exhibits no edema and no tenderness.  Lymphadenopathy:    He has no cervical adenopathy.  Neurological: He is alert and oriented to person, place, and time. He has normal reflexes. No cranial nerve deficit. He exhibits normal muscle tone. Coordination normal.  Skin: Skin is warm and dry. Rash (AKs, SKs) noted.  Psychiatric: He has a normal mood and affect. His behavior is normal. Judgment and thought content normal.     Lab Results  Component Value Date   WBC 8.2 12/05/2012   HGB 14.6 12/05/2012   HCT 42.6 12/05/2012   PLT 223.0 12/05/2012   GLUCOSE 71 12/05/2012   CHOL 124 12/05/2012   TRIG 58.0 12/05/2012   HDL 66.90 12/05/2012   LDLCALC 46 12/05/2012   ALT 20 12/05/2012   AST 28 12/05/2012   NA 134* 12/05/2012   K 5.2* 12/05/2012   CL 99 12/05/2012   CREATININE 1.2 12/05/2012   BUN 15 12/05/2012   CO2 30 12/05/2012   TSH 2.84 12/05/2012   PSA 7.60* 12/05/2012   HGBA1C 6.4 12/05/2012        Assessment & Plan:

## 2013-03-16 ENCOUNTER — Other Ambulatory Visit: Payer: Self-pay | Admitting: Cardiovascular Disease

## 2013-04-05 ENCOUNTER — Other Ambulatory Visit: Payer: Self-pay | Admitting: Cardiovascular Disease

## 2013-04-16 ENCOUNTER — Other Ambulatory Visit: Payer: Self-pay

## 2013-04-16 MED ORDER — CARVEDILOL 3.125 MG PO TABS: ORAL_TABLET | ORAL | Status: DC

## 2013-05-02 ENCOUNTER — Other Ambulatory Visit: Payer: Self-pay | Admitting: Cardiovascular Disease

## 2013-05-07 ENCOUNTER — Other Ambulatory Visit: Payer: Self-pay | Admitting: Cardiovascular Disease

## 2013-05-21 ENCOUNTER — Other Ambulatory Visit: Payer: Self-pay | Admitting: Cardiovascular Disease

## 2013-05-21 ENCOUNTER — Other Ambulatory Visit: Payer: Self-pay

## 2013-05-21 MED ORDER — CLOPIDOGREL BISULFATE 75 MG PO TABS: ORAL_TABLET | ORAL | Status: DC

## 2013-06-04 ENCOUNTER — Other Ambulatory Visit: Payer: Self-pay | Admitting: Cardiovascular Disease

## 2013-06-12 ENCOUNTER — Encounter (HOSPITAL_COMMUNITY): Payer: Self-pay | Admitting: Emergency Medicine

## 2013-06-12 ENCOUNTER — Emergency Department (HOSPITAL_COMMUNITY)
Admission: EM | Admit: 2013-06-12 | Discharge: 2013-06-12 | Disposition: A | Discharge status: Home / Self Care | Payer: Medicare Other | Attending: Emergency Medicine | Admitting: Emergency Medicine

## 2013-06-12 ENCOUNTER — Telehealth: Payer: Self-pay | Admitting: Internal Medicine

## 2013-06-12 DIAGNOSIS — Z7901 Long term (current) use of anticoagulants: Secondary | ICD-10-CM | POA: Diagnosis not present

## 2013-06-12 DIAGNOSIS — Z79899 Other long term (current) drug therapy: Secondary | ICD-10-CM | POA: Diagnosis not present

## 2013-06-12 DIAGNOSIS — E785 Hyperlipidemia, unspecified: Secondary | ICD-10-CM | POA: Insufficient documentation

## 2013-06-12 DIAGNOSIS — R1033 Periumbilical pain: Secondary | ICD-10-CM | POA: Diagnosis not present

## 2013-06-12 DIAGNOSIS — Z87448 Personal history of other diseases of urinary system: Secondary | ICD-10-CM | POA: Insufficient documentation

## 2013-06-12 DIAGNOSIS — I10 Essential (primary) hypertension: Secondary | ICD-10-CM | POA: Diagnosis not present

## 2013-06-12 DIAGNOSIS — I252 Old myocardial infarction: Secondary | ICD-10-CM | POA: Diagnosis not present

## 2013-06-12 DIAGNOSIS — R109 Unspecified abdominal pain: Secondary | ICD-10-CM

## 2013-06-12 DIAGNOSIS — I2510 Atherosclerotic heart disease of native coronary artery without angina pectoris: Secondary | ICD-10-CM | POA: Insufficient documentation

## 2013-06-12 DIAGNOSIS — Z862 Personal history of diseases of the blood and blood-forming organs and certain disorders involving the immune mechanism: Secondary | ICD-10-CM | POA: Diagnosis not present

## 2013-06-12 DIAGNOSIS — M1990 Unspecified osteoarthritis, unspecified site: Secondary | ICD-10-CM | POA: Insufficient documentation

## 2013-06-12 DIAGNOSIS — Z880 Allergy status to penicillin: Secondary | ICD-10-CM | POA: Insufficient documentation

## 2013-06-12 DIAGNOSIS — Z7982 Long term (current) use of aspirin: Secondary | ICD-10-CM | POA: Insufficient documentation

## 2013-06-12 HISTORY — DX: Acute myocardial infarction, unspecified: I21.9

## 2013-06-12 LAB — CBC WITH DIFFERENTIAL/PLATELET
Basophils Absolute: 0 10*3/uL (ref 0.0–0.1)
Basophils Relative: 0 % (ref 0–1)
Eosinophils Absolute: 0.1 10*3/uL (ref 0.0–0.7)
Eosinophils Relative: 1 % (ref 0–5)
HCT: 40.6 % (ref 39.0–52.0)
Hemoglobin: 14.1 g/dL (ref 13.0–17.0)
Lymphocytes Relative: 19 % (ref 12–46)
Lymphs Abs: 1.6 10*3/uL (ref 0.7–4.0)
MCH: 31.6 pg (ref 26.0–34.0)
MCHC: 34.7 g/dL (ref 30.0–36.0)
MCV: 91 fL (ref 78.0–100.0)
Monocytes Absolute: 1.1 10*3/uL — ABNORMAL HIGH (ref 0.1–1.0)
Monocytes Relative: 13 % — ABNORMAL HIGH (ref 3–12)
Neutro Abs: 5.5 10*3/uL (ref 1.7–7.7)
Neutrophils Relative %: 67 % (ref 43–77)
Platelets: 216 10*3/uL (ref 150–400)
RBC: 4.46 MIL/uL (ref 4.22–5.81)
RDW: 13.9 % (ref 11.5–15.5)
WBC: 8.3 10*3/uL (ref 4.0–10.5)

## 2013-06-12 LAB — COMPREHENSIVE METABOLIC PANEL
ALT: 17 U/L (ref 0–53)
AST: 26 U/L (ref 0–37)
Albumin: 3.4 g/dL — ABNORMAL LOW (ref 3.5–5.2)
Alkaline Phosphatase: 56 U/L (ref 39–117)
BUN: 16 mg/dL (ref 6–23)
CO2: 24 mEq/L (ref 19–32)
Calcium: 9.1 mg/dL (ref 8.4–10.5)
Chloride: 96 mEq/L (ref 96–112)
Creatinine, Ser: 0.98 mg/dL (ref 0.50–1.35)
GFR calc Af Amer: 83 mL/min — ABNORMAL LOW (ref >90)
GFR calc non Af Amer: 72 mL/min — ABNORMAL LOW (ref >90)
Glucose, Bld: 168 mg/dL — ABNORMAL HIGH (ref 70–99)
Potassium: 4.9 mEq/L (ref 3.7–5.3)
Sodium: 132 mEq/L — ABNORMAL LOW (ref 137–147)
Total Bilirubin: 0.8 mg/dL (ref 0.3–1.2)
Total Protein: 7.1 g/dL (ref 6.0–8.3)

## 2013-06-12 LAB — LIPASE, BLOOD: Lipase: 43 U/L (ref 11–59)

## 2013-06-12 LAB — URINALYSIS, ROUTINE W REFLEX MICROSCOPIC
Bilirubin Urine: NEGATIVE
Glucose, UA: NEGATIVE mg/dL
Hgb urine dipstick: NEGATIVE
Ketones, ur: NEGATIVE mg/dL
Leukocytes, UA: NEGATIVE
Nitrite: NEGATIVE
Protein, ur: NEGATIVE mg/dL
Specific Gravity, Urine: 1.019 (ref 1.005–1.030)
Urobilinogen, UA: 0.2 mg/dL (ref 0.0–1.0)
pH: 6.5 (ref 5.0–8.0)

## 2013-06-12 LAB — I-STAT TROPONIN, ED: Troponin i, poc: 0.01 ng/mL (ref 0.00–0.08)

## 2013-06-12 NOTE — ED Provider Notes (Signed)
CSN: [id number]     Arrival date & time 06/12/13  1518 History   First MD Initiated Contact with Patient 06/12/13 1759     Chief Complaint  Patient presents with  . Abdominal Pain     (Consider location/radiation/quality/duration/timing/severity/associated sxs/prior Treatment) Patient is a 80 y.o. Riddle presenting with abdominal pain.  Abdominal Pain  Complains of abdominal pain, periumbilical onset 9 AM today. Symptoms have resolved spontaneously without treatment. He is presently asymptomatic. Denies nausea vomiting denies chest pain denies shortness of breath and last bowel movement today normal no urinary symptoms no other complaint. No treatment prior to coming here. No fever no chest pain no other associated symptoms Past Medical History  Diagnosis Date  . Coronary atherosclerosis of unspecified type of vessel, native or graft   . Unspecified essential hypertension   . Other and unspecified hyperlipidemia   . Hypertrophy of prostate without urinary obstruction and other lower urinary tract symptoms (LUTS)   . Thrombocytopenia, unspecified   . Osteoarthrosis, unspecified whether generalized or localized, unspecified site   . Dizziness and giddiness   . MI (myocardial infarction)    Past Surgical History  Procedure Laterality Date  . Colonoscopy    . Hip arthroplasty    . Transurethral resection of prostate     Family History  Problem Relation Age of Onset  . Coronary artery disease Other   . Heart disease Father    History  Substance Use Topics  . Smoking status: Never Smoker   . Smokeless tobacco: Not on file  . Alcohol Use: No    Review of Systems  Constitutional: Negative.   HENT: Negative.   Respiratory: Negative.   Cardiovascular: Negative.   Gastrointestinal: Positive for abdominal pain.  Musculoskeletal: Negative.   Skin: Negative.   Neurological: Negative.   Psychiatric/Behavioral: Negative.   All other systems reviewed and are  negative.     Allergies  Oxycodone and Penicillins  Home Medications   Prior to Admission medications   Medication Sig Start Date End Date Taking? Authorizing Provider  aspirin 81 MG EC tablet Take 81 mg by mouth daily.      Historical Provider, MD  b complex vitamins tablet Take 1 tablet by mouth daily.      Historical Provider, MD  carvedilol (COREG) 3.125 MG tablet TAKE 1 TABLET BY MOUTH 2 TIMES DAILY WITH A MEAL 04/16/13   Burnell Blanks, MD  Cholecalciferol 1000 UNITS tablet Take 1,000 Units by mouth daily.      Historical Provider, MD  clopidogrel (PLAVIX) 75 MG tablet TAKE 1 TABLET BY MOUTH EVERY DAY 05/21/13   Burnell Blanks, MD  Glucosamine-Chondroit-Vit C-Mn (GLUCOSAMINE 1500 COMPLEX PO) Take 1 tablet by mouth 2 (two) times daily.     Historical Provider, MD  lisinopril (PRINIVIL,ZESTRIL) 20 MG tablet TAKE 1/2 TAB TWICE A DAY 06/04/13   Burnell Blanks, MD  nitroGLYCERIN (NITROSTAT) 0.4 MG SL tablet Place 1 tablet (0.4 mg total) under the tongue every 5 (five) minutes as needed for chest pain. 07/17/12   Burnell Blanks, MD  simvastatin (ZOCOR) 80 MG tablet TAKE 1 TABLET BY MOUTH AT BEDTIME 10/04/12   Burnell Blanks, MD   BP 136/64  Pulse 50  Temp(Src) 97.8 F (36.6 C) (Oral)  Resp 18  Ht 5\' 8"  (1.727 m)  Wt 152 lb (68.947 kg)  BMI 23.12 kg/m2  SpO2 99% Physical Exam  Nursing note and vitals reviewed. Constitutional: He appears well-developed and well-nourished.  HENT:  Head: Normocephalic and atraumatic.  Eyes: Conjunctivae are normal. Pupils are equal, round, and reactive to light.  Neck: Neck supple. No tracheal deviation present. No thyromegaly present.  Cardiovascular: Regular rhythm.   No murmur heard. Mildly bradycardic  Pulmonary/Chest: Effort normal and breath sounds normal.  Abdominal: Soft. Bowel sounds are normal. He exhibits no distension. There is no tenderness.  Musculoskeletal: Normal range of motion. He exhibits no  edema and no tenderness.  Neurological: He is alert. Coordination normal.  Skin: Skin is warm and dry. No rash noted.  Psychiatric: He has a normal mood and affect.    ED Course  Procedures (including critical care time) Labs Review   Labs Reviewed  CBC WITH DIFFERENTIAL - Abnormal; Notable for the following:    Monocytes Relative 13 (*)    Monocytes Absolute 1.1 (*)    All other components within normal limits  COMPREHENSIVE METABOLIC PANEL - Abnormal; Notable for the following:    Sodium 132 (*)    Glucose, Bld 168 (*)    Albumin 3.4 (*)    GFR calc non Af Amer 72 (*)    GFR calc Af Amer 83 (*)    All other components within normal limits  LIPASE, BLOOD  URINALYSIS, ROUTINE W REFLEX MICROSCOPIC  I-STAT TROPOININ, ED    Imaging Review No results found.   EKG Interpretation   Date/Time:  Tuesday Jun 12 2013 16:18:08 EDT Ventricular Rate:  51 PR Interval:  134 QRS Duration: 92 QT Interval:  422 QTC Calculation: 388 R Axis:   -40 Text Interpretation:  Sinus bradycardia Left axis deviation Abnormal ECG  No significant change since last tracing Confirmed by Winfred Leeds  MD, Jeanette Moffatt  [phone]) on 06/12/2013 6:33:33 PM       Results for orders placed during the hospital encounter of 06/12/13  CBC WITH DIFFERENTIAL      Result Value Ref Range   WBC 8.3  4.0 - 10.5 K/uL   RBC 4.46  4.22 - 5.81 MIL/uL   Hemoglobin 14.1  13.0 - 17.0 g/dL   HCT 40.6  39.0 - 52.0 %   MCV 91.0  78.0 - 100.0 fL   MCH 31.6  26.0 - 34.0 pg   MCHC 34.7  30.0 - 36.0 g/dL   RDW 13.9  11.5 - 15.5 %   Platelets 216  150 - 400 K/uL   Neutrophils Relative % 67  Ricky - 77 %   Neutro Abs 5.5  1.7 - 7.7 K/uL   Lymphocytes Relative 19  12 - 46 %   Lymphs Abs 1.6  0.7 - 4.0 K/uL   Monocytes Relative 13 (*) 3 - 12 %   Monocytes Absolute 1.1 (*) 0.1 - 1.0 K/uL   Eosinophils Relative 1  0 - 5 %   Eosinophils Absolute 0.1  0.0 - 0.7 K/uL   Basophils Relative 0  0 - 1 %   Basophils Absolute 0.0  0.0 - 0.1 K/uL   LIPASE, BLOOD      Result Value Ref Range   Lipase Ricky  11 - 59 U/L  COMPREHENSIVE METABOLIC PANEL      Result Value Ref Range   Sodium 132 (*) 137 - 147 mEq/L   Potassium 4.9  3.7 - 5.3 mEq/L   Chloride 96  96 - 112 mEq/L   CO2 24  19 - 32 mEq/L   Glucose, Bld 168 (*) 70 - 99 mg/dL   BUN 16  6 - 23 mg/dL   Creatinine,  Ser 0.98  0.50 - 1.35 mg/dL   Calcium 9.1  8.4 - 10.5 mg/dL   Total Protein 7.1  6.0 - 8.3 g/dL   Albumin 3.4 (*) 3.5 - 5.2 g/dL   AST 26  0 - 37 U/L   ALT 17  0 - 53 U/L   Alkaline Phosphatase 56  39 - 117 U/L   Total Bilirubin 0.8  0.3 - 1.2 mg/dL   GFR calc non Af Amer 72 (*) >90 mL/min   GFR calc Af Amer 83 (*) >90 mL/min  URINALYSIS, ROUTINE W REFLEX MICROSCOPIC      Result Value Ref Range   Color, Urine YELLOW  YELLOW   APPearance CLEAR  CLEAR   Specific Gravity, Urine 1.019  1.005 - 1.030   pH 6.5  5.0 - 8.0   Glucose, UA NEGATIVE  NEGATIVE mg/dL   Hgb urine dipstick NEGATIVE  NEGATIVE   Bilirubin Urine NEGATIVE  NEGATIVE   Ketones, ur NEGATIVE  NEGATIVE mg/dL   Protein, ur NEGATIVE  NEGATIVE mg/dL   Urobilinogen, UA 0.2  0.0 - 1.0 mg/dL   Nitrite NEGATIVE  NEGATIVE   Leukocytes, UA NEGATIVE  NEGATIVE  I-STAT TROPOININ, ED      Result Value Ref Range   Troponin i, poc 0.01  0.00 - 0.08 ng/mL   Comment 3            No results found.   8:20 PM remains asymptomatic, pain-free MDM   Final diagnoses:  None    Pain is felt to be nonspecific. plan return if condition worsens. Keep scheduled appointment with Dr.Plotnikov next month. Avoid sweets Diagnosis #1 abdominal pain #2 hyperglycemia      Orlie Dakin, MD 06/12/13 2027

## 2013-06-12 NOTE — ED Notes (Signed)
Pt comfortable with discharge and follow up instructions. No prescriptions given.

## 2013-06-12 NOTE — ED Notes (Signed)
Pt states that he woke this morning with mid abdominal pain that is constant. Pt denies any N/V/D. Pt states that he has had decreased appetite since it started.

## 2013-06-12 NOTE — Discharge Instructions (Signed)
Abdominal Pain, Adult Your blood sugar tonight was 168 which is mildly elevated. Avoid sweets. You may be borderline diabetic. Keep your scheduled appointment with Dr.Plotnikov the next month. Return if your condition worsens for any reason. Many things can cause belly (abdominal) pain. Most times, the belly pain is not dangerous. Many cases of belly pain can be watched and treated at home. HOME CARE   Do not take medicines that help you go poop (laxatives) unless told to by your doctor.  Only take medicine as told by your doctor.  Eat or drink as told by your doctor. Your doctor will tell you if you should be on a special diet. GET HELP IF:  You do not know what is causing your belly pain.  You have belly pain while you are sick to your stomach (nauseous) or have runny poop (diarrhea).  You have pain while you pee or poop.  Your belly pain wakes you up at night.  You have belly pain that gets worse or better when you eat.  You have belly pain that gets worse when you eat fatty foods. GET HELP RIGHT AWAY IF:   The pain does not go away within 2 hours.  You have a fever.  You keep throwing up (vomiting).  The pain changes and is only in the right or left part of the belly.  You have bloody or tarry looking poop. MAKE SURE YOU:   Understand these instructions.  Will watch your condition.  Will get help right away if you are not doing well or get worse. Document Released: 07/07/2007 Document Revised: 11/08/2012 Document Reviewed: 09/27/2012 [facility] Patient Information 2014 [place].

## 2013-06-12 NOTE — Telephone Encounter (Signed)
Patient Information:  Caller Name: [name]  Phone: [phone]  Patient: Midas, Daughety  Gender: Male  DOB: November 30, 1923  Age: 80 Years  PCP: Plotnikov, Alex (Adults only)  Office Follow Up:  Does the office need to follow up with this patient?: No  Instructions For The Office: N/A  RN Note:  Constant midline upper abdominal pain rated 5/10 since got out of bed and stood up this morning at 0630. Had normal BM 06/11/13.  Ate some tomato soup withone cracker at 1330. No nausea or vomiting. No appointments remain in office.  RN unable to reach Dr Plotnikov's nurse at time of call for approval to send to ED. Per Hoyle Sauer, advised to send to [facility] ED now.  Symptoms  Reason For Call & Symptoms: Abdominal pain with belching and flatulance.  Reviewed Health History In EMR: Yes  Reviewed Medications In EMR: Yes  Reviewed Allergies In EMR: Yes  Reviewed Surgeries / Procedures: Yes  Date of Onset of Symptoms: 06/12/2013  Treatments Tried: lying down  Treatments Tried Worked: Yes  Guideline(s) Used:  Abdominal Pain - Upper  Disposition Per Guideline:   Go to ED Now  Reason For Disposition Reached:   Pain lasting > 10 minutes and over 68 years old  Advice Given:  Avoid NSAIDS and Aspirin  : Avoid any drug that can irritate the stomach lining and make the pain worse (especially aspirin and NSAIDs like ibuprofen).  Call Back If:  Abdominal pain is constant and present for more than 2 hours.  You become worse.  Patient Will Follow Care Advice:  YES

## 2013-07-05 ENCOUNTER — Encounter: Payer: Self-pay | Admitting: Cardiovascular Disease

## 2013-07-05 ENCOUNTER — Ambulatory Visit (INDEPENDENT_AMBULATORY_CARE_PROVIDER_SITE_OTHER): Payer: Medicare Other | Admitting: Cardiovascular Disease

## 2013-07-05 VITALS — BP 130/56 | HR 66 | Ht 68.0 in | Wt 154.0 lb

## 2013-07-05 DIAGNOSIS — I10 Essential (primary) hypertension: Secondary | ICD-10-CM

## 2013-07-05 DIAGNOSIS — I2510 Atherosclerotic heart disease of native coronary artery without angina pectoris: Secondary | ICD-10-CM

## 2013-07-05 MED ORDER — NITROGLYCERIN 0.4 MG SL SUBL: 0.4000 mg | SUBLINGUAL_TABLET | SUBLINGUAL | Status: DC | PRN

## 2013-07-05 MED ORDER — LISINOPRIL 10 MG PO TABS: 10.0000 mg | ORAL_TABLET | Freq: Two times a day (BID) | ORAL | Status: DC

## 2013-07-05 NOTE — Progress Notes (Signed)
History of Present Illness: 80 yo WM with h/o CAD s/p posterior inferior MI 2002 with placement of a bare metal stent in the circumflex artery and subsequent unstable angina August 2007 with placement of drug eluting stent in the mid LAD , also history of HTN, hyperlipidemia who is here today for follow up. He was admitted to [facility] 09/29/10 for chest pain. Cardiac enzymes were negative. Stress myoview as outpt 10/07/10 without ischemia.  He is here today for follow up. He has had no chest pain or SOB. No palpitations, dizziness, near syncope or syncope. He has been active. He continues to exercise. He has been stressed out lately with his wife having recently fallen and broken her leg.   Primary Care Physician: Plotnikov  Last Lipid Profile:Lipid Panel     Component Value Date/Time   CHOL 124 12/05/2012 1517   TRIG 58.0 12/05/2012 1517   HDL 66.90 12/05/2012 1517   CHOLHDL 2 12/05/2012 1517   VLDL 11.6 12/05/2012 1517   LDLCALC 46 12/05/2012 1517    Past Medical History  Diagnosis Date  . Coronary atherosclerosis of unspecified type of vessel, native or graft   . Unspecified essential hypertension   . Other and unspecified hyperlipidemia   . Hypertrophy of prostate without urinary obstruction and other lower urinary tract symptoms (LUTS)   . Thrombocytopenia, unspecified   . Osteoarthrosis, unspecified whether generalized or localized, unspecified site   . Dizziness and giddiness   . MI (myocardial infarction)     Past Surgical History  Procedure Laterality Date  . Colonoscopy    . Hip arthroplasty    . Transurethral resection of prostate      Current Outpatient Prescriptions  Medication Sig Dispense Refill  . aspirin 81 MG EC tablet Take 81 mg by mouth daily.        [name] b complex vitamins tablet Take 1 tablet by mouth daily.        . carvedilol (COREG) 6.25 MG tablet Take 6.25 mg by mouth 2 (two) times daily with a meal.      . Cholecalciferol 1000 UNITS tablet  Take 1,000 Units by mouth daily.        . clopidogrel (PLAVIX) 75 MG tablet Take 75 mg by mouth daily with breakfast.      . Glucosamine-Chondroit-Vit C-Mn (GLUCOSAMINE 1500 COMPLEX PO) Take 1 tablet by mouth 2 (two) times daily.       [name] lisinopril (PRINIVIL,ZESTRIL) 20 MG tablet Take 10 mg by mouth 2 (two) times daily.      . nitroGLYCERIN (NITROSTAT) 0.4 MG SL tablet Place 1 tablet (0.4 mg total) under the tongue every 5 (five) minutes as needed for chest pain.  25 tablet  6  . simvastatin (ZOCOR) 80 MG tablet Take 80 mg by mouth at bedtime.       No current facility-administered medications for this visit.    Allergies  Allergen Reactions  . Oxycodone Nausea And Vomiting  . Penicillins Hives    History   Social History  . Marital Status: Married    Spouse Name: N/A    Number of Children: N/A  . Years of Education: N/A   Occupational History  . Retired    Social History Main Topics  . Smoking status: Never Smoker   . Smokeless tobacco: Not on file  . Alcohol Use: No  . Drug Use: No  . Sexual Activity: Not Currently   Other Topics Concern  . Not on file  Social History Narrative   Regular Exercise -  YES          Family History  Problem Relation Age of Onset  . Coronary artery disease Other   . Heart disease Father     Review of Systems:  As stated in the HPI and otherwise negative.   BP 130/56  Pulse 66  Ht 5\' 8"  (1.727 m)  Wt 154 lb (69.854 kg)  BMI 23.42 kg/m2  Physical Examination: General: Well developed, well nourished, NAD HEENT: OP clear, mucus membranes moist SKIN: warm, dry. No rashes. Neuro: No focal deficits Musculoskeletal: Muscle strength 5/5 all ext Psychiatric: Mood and affect normal Neck: No JVD, no carotid bruits, no thyromegaly, no lymphadenopathy. Lungs:Clear bilaterally, no wheezes, rhonci, crackles Cardiovascular: Loletha Grayer, regular rhythm. No murmurs, gallops or rubs. Abdomen:Soft. Bowel sounds present. Non-tender.  Extremities:  No lower extremity edema. Pulses are 2 + in the bilateral DP/PT.  Assessment and Plan:   1. CORONARY ARTERY DISEASE: Stable. Continue ASA, beta blocker, Ace-inh, statin.    2. HYPERTENSION: BP well controlled. No changes

## 2013-07-05 NOTE — Patient Instructions (Signed)
Your physician wants you to follow-up in:  6 months. You will receive a reminder letter in the mail two months in advance. If you don't receive a letter, please call our office to schedule the follow-up appointment.   

## 2013-08-02 DIAGNOSIS — H2510 Age-related nuclear cataract, unspecified eye: Secondary | ICD-10-CM | POA: Diagnosis not present

## 2013-08-07 ENCOUNTER — Ambulatory Visit (INDEPENDENT_AMBULATORY_CARE_PROVIDER_SITE_OTHER): Payer: Medicare Other | Admitting: Internal Medicine

## 2013-08-07 ENCOUNTER — Encounter: Payer: Self-pay | Admitting: Internal Medicine

## 2013-08-07 ENCOUNTER — Other Ambulatory Visit (INDEPENDENT_AMBULATORY_CARE_PROVIDER_SITE_OTHER): Payer: Medicare Other

## 2013-08-07 VITALS — BP 124/52 | HR 56 | Temp 98.3°F | Resp 16 | Wt 159.0 lb

## 2013-08-07 DIAGNOSIS — I10 Essential (primary) hypertension: Secondary | ICD-10-CM

## 2013-08-07 DIAGNOSIS — N400 Enlarged prostate without lower urinary tract symptoms: Secondary | ICD-10-CM

## 2013-08-07 DIAGNOSIS — E785 Hyperlipidemia, unspecified: Secondary | ICD-10-CM | POA: Diagnosis not present

## 2013-08-07 DIAGNOSIS — Z0000 Encounter for general adult medical examination without abnormal findings: Secondary | ICD-10-CM

## 2013-08-07 DIAGNOSIS — D696 Thrombocytopenia, unspecified: Secondary | ICD-10-CM | POA: Diagnosis not present

## 2013-08-07 DIAGNOSIS — Z125 Encounter for screening for malignant neoplasm of prostate: Secondary | ICD-10-CM | POA: Diagnosis not present

## 2013-08-07 DIAGNOSIS — N320 Bladder-neck obstruction: Secondary | ICD-10-CM

## 2013-08-07 DIAGNOSIS — F4321 Adjustment disorder with depressed mood: Secondary | ICD-10-CM

## 2013-08-07 DIAGNOSIS — I2510 Atherosclerotic heart disease of native coronary artery without angina pectoris: Secondary | ICD-10-CM

## 2013-08-07 LAB — CBC WITH DIFFERENTIAL/PLATELET
Basophils Absolute: 0 10*3/uL (ref 0.0–0.1)
Basophils Relative: 0.1 % (ref 0.0–3.0)
Eosinophils Absolute: 0.1 10*3/uL (ref 0.0–0.7)
Eosinophils Relative: 1.1 % (ref 0.0–5.0)
HCT: 44.5 % (ref 39.0–52.0)
Hemoglobin: 14.8 g/dL (ref 13.0–17.0)
Lymphocytes Relative: 17.9 % (ref 12.0–46.0)
Lymphs Abs: 1.5 10*3/uL (ref 0.7–4.0)
MCHC: 33.3 g/dL (ref 30.0–36.0)
MCV: 94.7 fl (ref 78.0–100.0)
Monocytes Absolute: 1 10*3/uL (ref 0.1–1.0)
Monocytes Relative: 11.1 % (ref 3.0–12.0)
Neutro Abs: 6 10*3/uL (ref 1.4–7.7)
Neutrophils Relative %: 69.8 % (ref 43.0–77.0)
Platelets: 220 10*3/uL (ref 150.0–400.0)
RBC: 4.71 Mil/uL (ref 4.22–5.81)
RDW: 15.1 % (ref 11.5–15.5)
WBC: 8.6 10*3/uL (ref 4.0–10.5)

## 2013-08-07 LAB — LIPID PANEL
Cholesterol: 144 mg/dL (ref 0–200)
HDL: 79.9 mg/dL (ref >39.00)
LDL Cholesterol: 50 mg/dL (ref 0–99)
NonHDL: 64.1
Total CHOL/HDL Ratio: 2
Triglycerides: 73 mg/dL (ref 0.0–149.0)
VLDL: 14.6 mg/dL (ref 0.0–40.0)

## 2013-08-07 LAB — BASIC METABOLIC PANEL
BUN: 19 mg/dL (ref 6–23)
CO2: 27 mEq/L (ref 19–32)
Calcium: 9.5 mg/dL (ref 8.4–10.5)
Chloride: 102 mEq/L (ref 96–112)
Creatinine, Ser: 1.1 mg/dL (ref 0.4–1.5)
GFR: 65.77 mL/min (ref >60.00)
Glucose, Bld: 131 mg/dL — ABNORMAL HIGH (ref 70–99)
Potassium: 4.3 mEq/L (ref 3.5–5.1)
Sodium: 137 mEq/L (ref 135–145)

## 2013-08-07 LAB — URINALYSIS
Bilirubin Urine: NEGATIVE
Hgb urine dipstick: NEGATIVE
Leukocytes, UA: NEGATIVE
Nitrite: NEGATIVE
Specific Gravity, Urine: 1.025 (ref 1.000–1.030)
Total Protein, Urine: NEGATIVE
Urine Glucose: NEGATIVE
Urobilinogen, UA: 0.2 (ref 0.0–1.0)
pH: 5.5 (ref 5.0–8.0)

## 2013-08-07 LAB — HEPATIC FUNCTION PANEL
ALT: 22 U/L (ref 0–53)
AST: 32 U/L (ref 0–37)
Albumin: 3.8 g/dL (ref 3.5–5.2)
Alkaline Phosphatase: 70 U/L (ref 39–117)
Bilirubin, Direct: 0.2 mg/dL (ref 0.0–0.3)
Total Bilirubin: 1.1 mg/dL (ref 0.2–1.2)
Total Protein: 7 g/dL (ref 6.0–8.3)

## 2013-08-07 LAB — TSH: TSH: 2.74 u[IU]/mL (ref 0.35–4.50)

## 2013-08-07 LAB — PSA: PSA: 6.45 ng/mL — ABNORMAL HIGH (ref 0.10–4.00)

## 2013-08-07 NOTE — Assessment & Plan Note (Signed)
CBC

## 2013-08-07 NOTE — Assessment & Plan Note (Signed)
Continue with current prescription therapy as reflected on the Med list.  

## 2013-08-07 NOTE — Patient Instructions (Signed)
Valerian root for anxiety 

## 2013-08-07 NOTE — Assessment & Plan Note (Signed)
Here for medicare wellness/physical  Diet: heart healthy  Physical activity: not sedentary  Depression/mood screen: negative  Hearing: intact to whispered voice  Visual acuity: grossly normal, performs annual eye exam  ADLs: capable  Fall risk: none  Home safety: good -- Moved to a [employer]. Cognitive evaluation: intact to orientation, naming, recall and repetition  EOL planning: adv directives, full code/ I agree  I have personally reviewed and have noted  1. The patient's medical and social history  2. Their use of alcohol, tobacco or illicit drugs  3. Their current medications and supplements  4. The patient's functional ability including ADL's, fall risks, home safety risks and hearing or visual impairment.  5. Diet and physical activities  6. Evidence for depression or mood disorders: [name] broke L leg - in Rehab now. Pt is stressed out...     Today patient counseled on age appropriate routine health concerns for screening and prevention, each reviewed and up to date or declined. Immunizations reviewed and up to date or declined. Labs ordered and reviewed. Risk factors for depression reviewed and negative. Hearing function and visual acuity are intact. ADLs screened and addressed as needed. Functional ability and level of safety reviewed and appropriate. Education, counseling and referrals performed based on assessed risks today. Patient provided with a copy of personalized plan for preventive services.   Labs

## 2013-08-07 NOTE — Assessment & Plan Note (Addendum)
7/15 [name] has broken her leg Discussed w/pt and dtr - declined meds  Start back exercising

## 2013-08-07 NOTE — Progress Notes (Signed)
   Subjective:    HPI   The patient is here for a wellness exam.   Moved to a [employer]. [name] broke L leg - in Rehab now.  The patient presents for a follow-up of  chronic hypertension, chronic dyslipidemia, CAD controlled with medicines. He is stressed out, depressed.  Wt Readings from Last 3 Encounters:  08/07/13 159 lb (72.122 kg)  07/05/13 154 lb (69.854 kg)  06/12/13 152 lb (68.947 kg)   BP Readings from Last 3 Encounters:  08/07/13 124/52  07/05/13 130/56  06/12/13 172/94    Review of Systems  Constitutional: Negative for appetite change, fatigue and unexpected weight change.  HENT: Negative for congestion, nosebleeds, sneezing, sore throat and trouble swallowing.   Eyes: Negative for itching and visual disturbance.  Respiratory: Negative for cough.   Cardiovascular: Negative for chest pain, palpitations and leg swelling.  Gastrointestinal: Negative for nausea, diarrhea, blood in stool and abdominal distention.  Genitourinary: Negative for frequency and hematuria.  Musculoskeletal: Negative for back pain, gait problem, joint swelling and neck pain.  Skin: Positive for rash.  Neurological: Negative for dizziness, tremors, speech difficulty and weakness.  Psychiatric/Behavioral: Negative for sleep disturbance, dysphoric mood and agitation. The patient is not nervous/anxious.        Objective:   Physical Exam  Constitutional: He is oriented to person, place, and time. He appears well-developed.  HENT:  Mouth/Throat: Oropharynx is clear and moist.  Eyes: Conjunctivae are normal. Pupils are equal, round, and reactive to light.  Neck: Normal range of motion. No JVD present. No thyromegaly present.  Cardiovascular: Normal rate, regular rhythm, normal heart sounds and intact distal pulses.  Exam reveals no gallop and no friction rub.   No murmur heard. Pulmonary/Chest: Effort normal and breath sounds normal. No respiratory distress. He has no wheezes. He  has no rales. He exhibits no tenderness.  Abdominal: Soft. Bowel sounds are normal. He exhibits no distension and no mass. There is no tenderness. There is no rebound and no guarding.  Musculoskeletal: Normal range of motion. He exhibits no edema and no tenderness.  Lymphadenopathy:    He has no cervical adenopathy.  Neurological: He is alert and oriented to person, place, and time. He has normal reflexes. No cranial nerve deficit. He exhibits normal muscle tone. Coordination normal.  Skin: Skin is warm and dry. Rash (AKs, SKs) noted.  Psychiatric: He has a normal mood and affect. His behavior is normal. Judgment and thought content normal.  tearful    Lab Results  Component Value Date   WBC 8.3 06/12/2013   HGB 14.1 06/12/2013   HCT 40.6 06/12/2013   PLT 216 06/12/2013   GLUCOSE 168* 06/12/2013   CHOL 124 12/05/2012   TRIG 58.0 12/05/2012   HDL 66.90 12/05/2012   LDLCALC 46 12/05/2012   ALT 17 06/12/2013   AST 26 06/12/2013   NA 132* 06/12/2013   K 4.9 06/12/2013   CL 96 06/12/2013   CREATININE 0.98 06/12/2013   BUN 16 06/12/2013   CO2 24 06/12/2013   TSH 2.84 12/05/2012   PSA 7.60* 12/05/2012   HGBA1C 6.4 12/05/2012        Assessment & Plan:

## 2013-08-07 NOTE — Progress Notes (Signed)
Pre visit review using our clinic review tool, if applicable. No additional management support is needed unless otherwise documented below in the visit note. 

## 2013-08-08 ENCOUNTER — Telehealth: Payer: Self-pay | Admitting: Internal Medicine

## 2013-08-08 NOTE — Telephone Encounter (Signed)
Relevant patient education assigned to patient using Emmi. ° °

## 2013-08-15 ENCOUNTER — Other Ambulatory Visit: Payer: Self-pay | Admitting: Cardiovascular Disease

## 2013-08-16 ENCOUNTER — Other Ambulatory Visit: Payer: Self-pay | Admitting: *Deleted

## 2013-08-16 MED ORDER — CARVEDILOL 6.25 MG PO TABS: 6.2500 mg | ORAL_TABLET | Freq: Two times a day (BID) | ORAL | Status: DC

## 2013-08-30 ENCOUNTER — Other Ambulatory Visit: Payer: Self-pay | Admitting: Dermatology

## 2013-08-30 DIAGNOSIS — L821 Other seborrheic keratosis: Secondary | ICD-10-CM | POA: Diagnosis not present

## 2013-08-30 DIAGNOSIS — C44519 Basal cell carcinoma of skin of other part of trunk: Secondary | ICD-10-CM | POA: Diagnosis not present

## 2013-08-30 DIAGNOSIS — Z85828 Personal history of other malignant neoplasm of skin: Secondary | ICD-10-CM | POA: Diagnosis not present

## 2013-08-30 DIAGNOSIS — L820 Inflamed seborrheic keratosis: Secondary | ICD-10-CM | POA: Diagnosis not present

## 2013-08-30 DIAGNOSIS — L570 Actinic keratosis: Secondary | ICD-10-CM | POA: Diagnosis not present

## 2013-08-30 DIAGNOSIS — D485 Neoplasm of uncertain behavior of skin: Secondary | ICD-10-CM | POA: Diagnosis not present

## 2013-09-04 ENCOUNTER — Other Ambulatory Visit: Payer: Self-pay | Admitting: Cardiovascular Disease

## 2013-10-10 ENCOUNTER — Encounter: Payer: Self-pay | Admitting: Internal Medicine

## 2013-10-10 ENCOUNTER — Ambulatory Visit (INDEPENDENT_AMBULATORY_CARE_PROVIDER_SITE_OTHER): Payer: Medicare Other | Admitting: Internal Medicine

## 2013-10-10 VITALS — BP 170/80 | HR 80 | Temp 98.0°F | Resp 16 | Wt 161.0 lb

## 2013-10-10 DIAGNOSIS — I10 Essential (primary) hypertension: Secondary | ICD-10-CM

## 2013-10-10 DIAGNOSIS — F4321 Adjustment disorder with depressed mood: Secondary | ICD-10-CM

## 2013-10-10 DIAGNOSIS — I2510 Atherosclerotic heart disease of native coronary artery without angina pectoris: Secondary | ICD-10-CM

## 2013-10-10 DIAGNOSIS — I6529 Occlusion and stenosis of unspecified carotid artery: Secondary | ICD-10-CM | POA: Diagnosis not present

## 2013-10-10 DIAGNOSIS — E785 Hyperlipidemia, unspecified: Secondary | ICD-10-CM

## 2013-10-10 NOTE — Assessment & Plan Note (Signed)
Continue with current prescription therapy as reflected on the Med list.  

## 2013-10-10 NOTE — Assessment & Plan Note (Signed)
7/15 [name] has broken her R leg - open fx He is coping a little better

## 2013-10-10 NOTE — Progress Notes (Signed)
Pre visit review using our clinic review tool, if applicable. No additional management support is needed unless otherwise documented below in the visit note. 

## 2013-10-10 NOTE — Progress Notes (Signed)
   Subjective:    HPI  Pt has moved to a [employer]. [name] broke L leg 3 mo ago (open fx)- in Rehab now.  The patient presents for a follow-up of  chronic hypertension, chronic dyslipidemia, CAD controlled with medicines. He is stressed out, depressed.  Wt Readings from Last 3 Encounters:  10/10/13 161 lb (73.029 kg)  08/07/13 159 lb (72.122 kg)  07/05/13 154 lb (69.854 kg)   BP Readings from Last 3 Encounters:  10/10/13 170/80  08/07/13 124/52  07/05/13 130/56    Review of Systems  Constitutional: Negative for appetite change, fatigue and unexpected weight change.  HENT: Negative for congestion, nosebleeds, sneezing, sore throat and trouble swallowing.   Eyes: Negative for itching and visual disturbance.  Respiratory: Negative for cough.   Cardiovascular: Negative for chest pain, palpitations and leg swelling.  Gastrointestinal: Negative for nausea, diarrhea, blood in stool and abdominal distention.  Genitourinary: Negative for frequency and hematuria.  Musculoskeletal: Negative for back pain, gait problem, joint swelling and neck pain.  Skin: Positive for rash.  Neurological: Negative for dizziness, tremors, speech difficulty and weakness.  Psychiatric/Behavioral: Negative for sleep disturbance, dysphoric mood and agitation. The patient is not nervous/anxious.        Objective:   Physical Exam  Constitutional: He is oriented to person, place, and time. He appears well-developed.  HENT:  Mouth/Throat: Oropharynx is clear and moist.  Eyes: Conjunctivae are normal. Pupils are equal, round, and reactive to light.  Neck: Normal range of motion. No JVD present. No thyromegaly present.  Cardiovascular: Normal rate, regular rhythm, normal heart sounds and intact distal pulses.  Exam reveals no gallop and no friction rub.   No murmur heard. Pulmonary/Chest: Effort normal and breath sounds normal. No respiratory distress. He has no wheezes. He has no rales. He  exhibits no tenderness.  Abdominal: Soft. Bowel sounds are normal. He exhibits no distension and no mass. There is no tenderness. There is no rebound and no guarding.  Musculoskeletal: Normal range of motion. He exhibits no edema and no tenderness.  Lymphadenopathy:    He has no cervical adenopathy.  Neurological: He is alert and oriented to person, place, and time. He has normal reflexes. No cranial nerve deficit. He exhibits normal muscle tone. Coordination normal.  Skin: Skin is warm and dry. Rash (AKs, SKs) noted.  Psychiatric: He has a normal mood and affect. His behavior is normal. Judgment and thought content normal.  Less tearful    Lab Results  Component Value Date   WBC 8.6 08/07/2013   HGB 14.8 08/07/2013   HCT 44.5 08/07/2013   PLT 220.0 08/07/2013   GLUCOSE 131* 08/07/2013   CHOL 144 08/07/2013   TRIG 73.0 08/07/2013   HDL 79.90 08/07/2013   LDLCALC 50 08/07/2013   ALT 22 08/07/2013   AST 32 08/07/2013   NA 137 08/07/2013   K 4.3 08/07/2013   CL 102 08/07/2013   CREATININE 1.1 08/07/2013   BUN 19 08/07/2013   CO2 27 08/07/2013   TSH 2.74 08/07/2013   PSA 6.45* 08/07/2013   HGBA1C 6.4 12/05/2012        Assessment & Plan:

## 2013-10-19 ENCOUNTER — Emergency Department (HOSPITAL_COMMUNITY)
Admission: EM | Admit: 2013-10-19 | Discharge: 2013-10-19 | Disposition: A | Discharge status: Home / Self Care | Payer: Medicare Other | Attending: Emergency Medicine | Admitting: Emergency Medicine

## 2013-10-19 ENCOUNTER — Encounter (HOSPITAL_COMMUNITY): Payer: Self-pay | Admitting: Emergency Medicine

## 2013-10-19 DIAGNOSIS — Z7902 Long term (current) use of antithrombotics/antiplatelets: Secondary | ICD-10-CM | POA: Diagnosis not present

## 2013-10-19 DIAGNOSIS — Z87448 Personal history of other diseases of urinary system: Secondary | ICD-10-CM | POA: Insufficient documentation

## 2013-10-19 DIAGNOSIS — M1990 Unspecified osteoarthritis, unspecified site: Secondary | ICD-10-CM | POA: Diagnosis not present

## 2013-10-19 DIAGNOSIS — I252 Old myocardial infarction: Secondary | ICD-10-CM | POA: Diagnosis not present

## 2013-10-19 DIAGNOSIS — R1110 Vomiting, unspecified: Secondary | ICD-10-CM | POA: Insufficient documentation

## 2013-10-19 DIAGNOSIS — Z79899 Other long term (current) drug therapy: Secondary | ICD-10-CM | POA: Insufficient documentation

## 2013-10-19 DIAGNOSIS — Z862 Personal history of diseases of the blood and blood-forming organs and certain disorders involving the immune mechanism: Secondary | ICD-10-CM | POA: Diagnosis not present

## 2013-10-19 DIAGNOSIS — Z880 Allergy status to penicillin: Secondary | ICD-10-CM | POA: Diagnosis not present

## 2013-10-19 DIAGNOSIS — I10 Essential (primary) hypertension: Secondary | ICD-10-CM | POA: Diagnosis not present

## 2013-10-19 DIAGNOSIS — K529 Noninfective gastroenteritis and colitis, unspecified: Secondary | ICD-10-CM

## 2013-10-19 DIAGNOSIS — Z7982 Long term (current) use of aspirin: Secondary | ICD-10-CM | POA: Diagnosis not present

## 2013-10-19 DIAGNOSIS — R112 Nausea with vomiting, unspecified: Secondary | ICD-10-CM | POA: Diagnosis not present

## 2013-10-19 DIAGNOSIS — I2510 Atherosclerotic heart disease of native coronary artery without angina pectoris: Secondary | ICD-10-CM | POA: Insufficient documentation

## 2013-10-19 DIAGNOSIS — E785 Hyperlipidemia, unspecified: Secondary | ICD-10-CM | POA: Diagnosis not present

## 2013-10-19 DIAGNOSIS — K5289 Other specified noninfective gastroenteritis and colitis: Secondary | ICD-10-CM | POA: Insufficient documentation

## 2013-10-19 DIAGNOSIS — R6889 Other general symptoms and signs: Secondary | ICD-10-CM | POA: Diagnosis not present

## 2013-10-19 LAB — COMPREHENSIVE METABOLIC PANEL
ALT: 17 U/L (ref 0–53)
AST: 27 U/L (ref 0–37)
Albumin: 4.1 g/dL (ref 3.5–5.2)
Alkaline Phosphatase: 68 U/L (ref 39–117)
Anion gap: 14 (ref 5–15)
BUN: 21 mg/dL (ref 6–23)
CO2: 23 mEq/L (ref 19–32)
Calcium: 9.8 mg/dL (ref 8.4–10.5)
Chloride: 104 mEq/L (ref 96–112)
Creatinine, Ser: 1.23 mg/dL (ref 0.50–1.35)
GFR calc Af Amer: 59 mL/min — ABNORMAL LOW (ref >90)
GFR calc non Af Amer: 51 mL/min — ABNORMAL LOW (ref >90)
Glucose, Bld: 120 mg/dL — ABNORMAL HIGH (ref 70–99)
Potassium: 5.3 mEq/L (ref 3.7–5.3)
Sodium: 141 mEq/L (ref 137–147)
Total Bilirubin: 1.2 mg/dL (ref 0.3–1.2)
Total Protein: 8.1 g/dL (ref 6.0–8.3)

## 2013-10-19 LAB — CBC WITH DIFFERENTIAL/PLATELET
Basophils Absolute: 0 10*3/uL (ref 0.0–0.1)
Basophils Relative: 0 % (ref 0–1)
Eosinophils Absolute: 0.1 10*3/uL (ref 0.0–0.7)
Eosinophils Relative: 1 % (ref 0–5)
HCT: 48.9 % (ref 39.0–52.0)
Hemoglobin: 16.6 g/dL (ref 13.0–17.0)
Lymphocytes Relative: 5 % — ABNORMAL LOW (ref 12–46)
Lymphs Abs: 0.6 10*3/uL — ABNORMAL LOW (ref 0.7–4.0)
MCH: 32 pg (ref 26.0–34.0)
MCHC: 33.9 g/dL (ref 30.0–36.0)
MCV: 94.4 fL (ref 78.0–100.0)
Monocytes Absolute: 0.7 10*3/uL (ref 0.1–1.0)
Monocytes Relative: 6 % (ref 3–12)
Neutro Abs: 9.9 10*3/uL — ABNORMAL HIGH (ref 1.7–7.7)
Neutrophils Relative %: 88 % — ABNORMAL HIGH (ref 43–77)
Platelets: 192 10*3/uL (ref 150–400)
RBC: 5.18 MIL/uL (ref 4.22–5.81)
RDW: 13.6 % (ref 11.5–15.5)
WBC: 11.3 10*3/uL — ABNORMAL HIGH (ref 4.0–10.5)

## 2013-10-19 LAB — LIPASE, BLOOD: Lipase: 41 U/L (ref 11–59)

## 2013-10-19 MED ORDER — ONDANSETRON HCL 4 MG/2ML IJ SOLN
4.0000 mg | Freq: Once | INTRAMUSCULAR | Status: AC
  Administered 2013-10-19: 4 mg via INTRAVENOUS
  Filled 2013-10-19: qty 2

## 2013-10-19 MED ORDER — LOPERAMIDE HCL 2 MG PO CAPS: 2.0000 mg | ORAL_CAPSULE | ORAL | Status: DC | PRN

## 2013-10-19 MED ORDER — DIPHENOXYLATE-ATROPINE 2.5-0.025 MG PO TABS: 1.0000 | ORAL_TABLET | Freq: Four times a day (QID) | ORAL | Status: DC | PRN

## 2013-10-19 MED ORDER — SODIUM CHLORIDE 0.9 % IV BOLUS (SEPSIS)
1000.0000 mL | Freq: Once | INTRAVENOUS | Status: AC
  Administered 2013-10-19: 1000 mL via INTRAVENOUS

## 2013-10-19 MED ORDER — SODIUM CHLORIDE 0.9 % IV SOLN
INTRAVENOUS | Status: DC
  Administered 2013-10-19: 125 mL/h via INTRAVENOUS

## 2013-10-19 MED ORDER — ONDANSETRON 8 MG PO TBDP: 8.0000 mg | ORAL_TABLET | Freq: Three times a day (TID) | ORAL | Status: DC | PRN

## 2013-10-19 NOTE — ED Notes (Signed)
Patient presents today via EMS with a chief complaint of nausea, vomiting, diarrhea that began this afternoon with chills and body aches.

## 2013-10-19 NOTE — ED Notes (Signed)
Bed: WA08 Expected date:  Expected time:  Means of arrival:  Comments: EMS-diarrhea

## 2013-10-19 NOTE — ED Provider Notes (Signed)
CSN: [id number]     Arrival date & time 10/19/13  1701 History   First MD Initiated Contact with Patient 10/19/13 1706     Chief Complaint  Patient presents with  . Emesis  . Diarrhea     (Consider location/radiation/quality/duration/timing/severity/associated sxs/prior Treatment) HPI Comments: Patient presents complaint nausea vomiting diarrhea today, which shows that began acutely. Patient ate a meal that he thinks caused this. Vomiting has been nonbilious and diarrhea as watery. No reported fever. No urinary symptoms. Symptoms persisted nothing makes it better worse. No treatment used prior to arrival. No prior history of same.  Patient is a 80 y.o. male presenting with vomiting and diarrhea. The history is provided by the patient.  Emesis Associated symptoms: diarrhea   Diarrhea Associated symptoms: vomiting     Past Medical History  Diagnosis Date  . Coronary atherosclerosis of unspecified type of vessel, native or graft   . Unspecified essential hypertension   . Other and unspecified hyperlipidemia   . Hypertrophy of prostate without urinary obstruction and other lower urinary tract symptoms (LUTS)   . Thrombocytopenia, unspecified   . Osteoarthrosis, unspecified whether generalized or localized, unspecified site   . Dizziness and giddiness   . MI (myocardial infarction)    Past Surgical History  Procedure Laterality Date  . Colonoscopy    . Hip arthroplasty    . Transurethral resection of prostate     Family History  Problem Relation Age of Onset  . Coronary artery disease Other   . Heart disease Father    History  Substance Use Topics  . Smoking status: Never Smoker   . Smokeless tobacco: Not on file  . Alcohol Use: No    Review of Systems  Gastrointestinal: Positive for vomiting and diarrhea.  All other systems reviewed and are negative.     Allergies  Oxycodone and Penicillins  Home Medications   Prior to Admission medications   Medication Sig  Start Date End Date Taking? Authorizing Provider  aspirin 81 MG EC tablet Take 81 mg by mouth daily.     Yes Historical Provider, MD  b complex vitamins tablet Take 1 tablet by mouth daily.     Yes Historical Provider, MD  carvedilol (COREG) 6.25 MG tablet Take 1 tablet (6.25 mg total) by mouth 2 (two) times daily with a meal. 08/16/13  Yes Burnell Blanks, MD  Cholecalciferol 1000 UNITS tablet Take 1,000 Units by mouth daily.     Yes Historical Provider, MD  clopidogrel (PLAVIX) 75 MG tablet Take 75 mg by mouth daily with breakfast.   Yes Historical Provider, MD  Glucosamine-Chondroit-Vit C-Mn (GLUCOSAMINE 1500 COMPLEX PO) Take 1 tablet by mouth 2 (two) times daily.    Yes Historical Provider, MD  lisinopril (PRINIVIL,ZESTRIL) 10 MG tablet Take 1 tablet (10 mg total) by mouth 2 (two) times daily. 07/05/13  Yes Burnell Blanks, MD  nitroGLYCERIN (NITROSTAT) 0.4 MG SL tablet Place 1 tablet (0.4 mg total) under the tongue every 5 (five) minutes as needed for chest pain. 07/05/13  Yes Burnell Blanks, MD  simvastatin (ZOCOR) 80 MG tablet Take 80 mg by mouth at bedtime.   Yes Historical Provider, MD   BP 121/99  Temp(Src) 97.9 F (36.6 C) (Oral)  Resp 20  SpO2 99% Physical Exam  Nursing note and vitals reviewed. Constitutional: He is oriented to person, place, and time. He appears well-developed and well-nourished.  Non-toxic appearance. No distress.  HENT:  Head: Normocephalic and atraumatic.  Eyes: Conjunctivae,  EOM and lids are normal. Pupils are equal, round, and reactive to light.  Neck: Normal range of motion. Neck supple. No tracheal deviation present. No mass present.  Cardiovascular: Normal rate, regular rhythm and normal heart sounds.  Exam reveals no gallop.   No murmur heard. Pulmonary/Chest: Effort normal and breath sounds normal. No stridor. No respiratory distress. He has no decreased breath sounds. He has no wheezes. He has no rhonchi. He has no rales.   Abdominal: Soft. Normal appearance and bowel sounds are normal. He exhibits no distension. There is no tenderness. There is no rebound and no CVA tenderness.  Musculoskeletal: Normal range of motion. He exhibits no edema and no tenderness.  Neurological: He is alert and oriented to person, place, and time. He has normal strength. No cranial nerve deficit or sensory deficit. GCS eye subscore is 4. GCS verbal subscore is 5. GCS motor subscore is 6.  Skin: Skin is warm and dry. No abrasion and no rash noted.  Psychiatric: He has a normal mood and affect. His speech is normal and behavior is normal.    ED Course  Procedures (including critical care time) Labs Review Labs Reviewed  COMPREHENSIVE METABOLIC PANEL  CBC WITH DIFFERENTIAL  LIPASE, BLOOD    Imaging Review No results found.   EKG Interpretation None      MDM   Final diagnoses:  None   Patient given IV fluids and medications here and feels much better. Suspect food poisoning. No evidence of surgical abdomen at this time. Patient stable for discharge and return precautions given    Leota Jacobsen, MD 10/19/13 2015

## 2013-10-19 NOTE — Discharge Instructions (Signed)
Food Poisoning °Food poisoning is an illness caused by something you ate or drank. There are over 250 known causes of food poisoning. However, many other causes are unknown. You can be treated even if the exact cause of your food poisoning is not known. In most cases, food poisoning is mild and lasts 1 to 2 days. However, some cases can be serious, especially for people with low immune systems, the elderly, children and infants, and pregnant women. °CAUSES  °Poor personal hygiene, improper cleaning of storage and preparation areas, and unclean utensils can cause infection or tainting (contamination) of foods. The causes of food poisoning are numerous. Infectious agents, such as viruses, bacteria, or parasites, can cause harm by infecting the intestine and disrupting the absorption of nutrients and water. This can cause diarrhea and lead to dehydration. Viruses are responsible for most of the food poisonings in which an agent is found. Parasites are less likely to cause food poisoning. Toxic agents, such as poisonous mushrooms, marine algae, and pesticides can also cause food poisoning. °· Viral causes of food poisoning include: °¨ Norovirus. °¨ Rotavirus. °¨ Hepatitis A. °· Bacterial causes of food poisoning include: °¨ Salmonellae. °¨ Campylobacter. °¨ Bacillus cereus. °¨ Escherichia coli (E. coli). °¨ Shigella. °¨ Listeria monocytogenes. °¨ Clostridium botulinum (botulism). °¨ Vibrio cholerae. °· Parasites that can cause food poisoning include: °¨ Giardia. °¨ Cryptosporidium. °¨ Toxoplasma. °SYMPTOMS °Symptoms may appear several hours or longer after consuming the contaminated food or drink. Symptoms may include: °· Nausea. °· Vomiting. °· Cramping. °· Diarrhea. °· Fever and chills. °· Muscle aches. °DIAGNOSIS °Your health care provider may be able to diagnose food poisoning from a list of what you have recently eaten and results from lab tests. Diagnostic tests may include an exam of the feces. °TREATMENT °In  most cases, treatment focuses on helping to relieve your symptoms and staying well hydrated. Antibiotic medicines are rarely needed. In severe cases, hospitalization may be required. °HOME CARE INSTRUCTIONS  °· Drink enough water and fluids to keep your urine clear or pale yellow. Drink small amounts of fluids frequently and increase as tolerated. °· Ask your health care provider for specific rehydration instructions. °· Avoid: °¨ Foods high in sugar. °¨ Alcohol. °¨ Carbonated drinks. °¨ Tobacco. °¨ Juice. °¨ Caffeine drinks. °¨ Extremely hot or cold fluids. °¨ Fatty, greasy foods. °¨ Too much intake of anything at one time. °¨ Dairy products until 24 to 48 hours after diarrhea stops. °· You may consume probiotics. Probiotics are active cultures of beneficial bacteria. They may lessen the amount and number of diarrheal stools in adults. Probiotics can be found in yogurt with active cultures and in supplements. °· Wash your hands well to avoid spreading the bacteria. °· Take medicines only as directed by your health care provider. Do not give your child aspirin because of the association with Reye's syndrome. °· Ask your health care provider if you should continue to take your regular prescribed and over-the-counter medicines. °PREVENTION  °· Wash your hands, food preparation surfaces, and utensils thoroughly before and after handling raw foods. °· Keep refrigerated foods below 40°F (5°C). °· Serve hot foods immediately or keep them heated above 140°F (60°C). °· Divide large volumes of food into small portions for rapid cooling in the refrigerator. Hot, bulky foods in the refrigerator can raise the temperature of other foods that have already cooled. °· Follow approved canning procedures. °· Heat canned foods thoroughly before tasting. °· When in doubt, throw it out. °· Infants, the elderly, women   who are pregnant, and people with compromised immune systems are especially susceptible to food poisoning. These people  should never consume unpasteurized cheese, unpasteurized cider, raw fish, raw seafood, or raw meat-type products. °SEEK IMMEDIATE MEDICAL CARE IF:  °· You have difficulty breathing, swallowing, talking, or moving. °· You develop blurred vision. °· You are unable to keep fluids down. °· You faint or nearly faint. °· Your eyes turn yellow. °· Vomiting or diarrhea develops or becomes persistent. °· Abdominal pain develops, increases, or localizes in one small area. °· You have a fever. °· The diarrhea becomes excessive or contains blood or mucus. °· You develop excessive weakness, dizziness, or extreme thirst. °· You have no urine for 8 hours. °MAKE SURE YOU:  °· Understand these instructions. °· Will watch your condition. °· Will get help right away if you are not doing well or get worse. °Document Released: 10/17/2003 Document Revised: 06/04/2013 Document Reviewed: 06/04/2010 °[employer]® Patient Information ©2015 [employer]. This information is not intended to replace advice given to you by your health care provider. Make sure you discuss any questions you have with your health care provider. ° °

## 2013-12-31 ENCOUNTER — Other Ambulatory Visit: Payer: Self-pay | Admitting: Cardiovascular Disease

## 2014-01-08 ENCOUNTER — Other Ambulatory Visit: Payer: Self-pay | Admitting: Cardiovascular Disease

## 2014-01-09 ENCOUNTER — Other Ambulatory Visit (INDEPENDENT_AMBULATORY_CARE_PROVIDER_SITE_OTHER): Payer: Medicare Other

## 2014-01-09 ENCOUNTER — Ambulatory Visit (INDEPENDENT_AMBULATORY_CARE_PROVIDER_SITE_OTHER): Payer: Medicare Other | Admitting: Internal Medicine

## 2014-01-09 ENCOUNTER — Encounter: Payer: Self-pay | Admitting: Internal Medicine

## 2014-01-09 VITALS — BP 128/74 | HR 51 | Temp 97.9°F | Ht 68.0 in | Wt 162.0 lb

## 2014-01-09 DIAGNOSIS — I10 Essential (primary) hypertension: Secondary | ICD-10-CM

## 2014-01-09 DIAGNOSIS — E785 Hyperlipidemia, unspecified: Secondary | ICD-10-CM

## 2014-01-09 DIAGNOSIS — I2510 Atherosclerotic heart disease of native coronary artery without angina pectoris: Secondary | ICD-10-CM | POA: Diagnosis not present

## 2014-01-09 LAB — BASIC METABOLIC PANEL
BUN: 16 mg/dL (ref 6–23)
CO2: 29 mEq/L (ref 19–32)
Calcium: 9.2 mg/dL (ref 8.4–10.5)
Chloride: 99 mEq/L (ref 96–112)
Creatinine, Ser: 1.1 mg/dL (ref 0.4–1.5)
GFR: 70.02 mL/min (ref >60.00)
Glucose, Bld: 110 mg/dL — ABNORMAL HIGH (ref 70–99)
Potassium: 4.7 mEq/L (ref 3.5–5.1)
Sodium: 134 mEq/L — ABNORMAL LOW (ref 135–145)

## 2014-01-09 NOTE — Progress Notes (Signed)
   Subjective:    HPI  Pt has moved to a [employer]. [name] broke L leg 3 mo ago (open fx)- in Rehab now - may come home this month (12.15).  The patient presents for a follow-up of  chronic hypertension, chronic dyslipidemia, CAD controlled with medicines. He is stressed out, less depressed.  Wt Readings from Last 3 Encounters:  01/09/14 162 lb (73.483 kg)  10/10/13 161 lb (73.029 kg)  08/07/13 159 lb (72.122 kg)   BP Readings from Last 3 Encounters:  01/09/14 128/74  10/19/13 139/64  10/10/13 170/80    Review of Systems  Constitutional: Negative for appetite change, fatigue and unexpected weight change.  HENT: Negative for congestion, nosebleeds, sneezing, sore throat and trouble swallowing.   Eyes: Negative for itching and visual disturbance.  Respiratory: Negative for cough.   Cardiovascular: Negative for chest pain, palpitations and leg swelling.  Gastrointestinal: Negative for nausea, diarrhea, blood in stool and abdominal distention.  Genitourinary: Negative for frequency and hematuria.  Musculoskeletal: Negative for back pain, joint swelling, gait problem and neck pain.  Skin: Positive for rash.  Neurological: Negative for dizziness, tremors, speech difficulty and weakness.  Psychiatric/Behavioral: Negative for sleep disturbance, dysphoric mood and agitation. The patient is not nervous/anxious.        Objective:   Physical Exam  Constitutional: He is oriented to person, place, and time. He appears well-developed. No distress.  NAD  HENT:  Mouth/Throat: Oropharynx is clear and moist.  Eyes: Conjunctivae are normal. Pupils are equal, round, and reactive to light.  Neck: Normal range of motion. No JVD present. No thyromegaly present.  Cardiovascular: Normal rate, regular rhythm, normal heart sounds and intact distal pulses.  Exam reveals no gallop and no friction rub.   No murmur heard. Pulmonary/Chest: Effort normal and breath sounds normal. No  respiratory distress. He has no wheezes. He has no rales. He exhibits no tenderness.  Abdominal: Soft. Bowel sounds are normal. He exhibits no distension and no mass. There is no tenderness. There is no rebound and no guarding.  Musculoskeletal: Normal range of motion. He exhibits no edema or tenderness.  Lymphadenopathy:    He has no cervical adenopathy.  Neurological: He is alert and oriented to person, place, and time. He has normal reflexes. No cranial nerve deficit. He exhibits normal muscle tone. He displays a negative Romberg sign. Coordination and gait normal.  Skin: Skin is warm and dry. No rash noted.  Psychiatric: He has a normal mood and affect. His behavior is normal. Judgment and thought content normal.  Not tearful    Lab Results  Component Value Date   WBC 11.3* 10/19/2013   HGB 16.6 10/19/2013   HCT 48.9 10/19/2013   PLT 192 10/19/2013   GLUCOSE 120* 10/19/2013   CHOL 144 08/07/2013   TRIG 73.0 08/07/2013   HDL 79.90 08/07/2013   LDLCALC 50 08/07/2013   ALT 17 10/19/2013   AST 27 10/19/2013   NA 141 10/19/2013   K 5.3 10/19/2013   CL 104 10/19/2013   CREATININE 1.23 10/19/2013   BUN 21 10/19/2013   CO2 23 10/19/2013   TSH 2.74 08/07/2013   PSA 6.45* 08/07/2013   HGBA1C 6.4 12/05/2012        Assessment & Plan:

## 2014-01-09 NOTE — Assessment & Plan Note (Signed)
Continue with current prescription therapy as reflected on the Med list.  

## 2014-01-31 ENCOUNTER — Other Ambulatory Visit: Payer: Self-pay | Admitting: Dermatology

## 2014-01-31 DIAGNOSIS — Z85828 Personal history of other malignant neoplasm of skin: Secondary | ICD-10-CM | POA: Diagnosis not present

## 2014-01-31 DIAGNOSIS — D485 Neoplasm of uncertain behavior of skin: Secondary | ICD-10-CM | POA: Diagnosis not present

## 2014-01-31 DIAGNOSIS — L720 Epidermal cyst: Secondary | ICD-10-CM | POA: Diagnosis not present

## 2014-01-31 DIAGNOSIS — L821 Other seborrheic keratosis: Secondary | ICD-10-CM | POA: Diagnosis not present

## 2014-01-31 DIAGNOSIS — L820 Inflamed seborrheic keratosis: Secondary | ICD-10-CM | POA: Diagnosis not present

## 2014-03-04 ENCOUNTER — Ambulatory Visit (INDEPENDENT_AMBULATORY_CARE_PROVIDER_SITE_OTHER): Payer: Medicare Other | Admitting: Cardiovascular Disease

## 2014-03-04 ENCOUNTER — Encounter: Payer: Self-pay | Admitting: Cardiovascular Disease

## 2014-03-04 VITALS — BP 100/50 | HR 52 | Wt 159.2 lb

## 2014-03-04 DIAGNOSIS — I10 Essential (primary) hypertension: Secondary | ICD-10-CM

## 2014-03-04 DIAGNOSIS — I2510 Atherosclerotic heart disease of native coronary artery without angina pectoris: Secondary | ICD-10-CM

## 2014-03-04 MED ORDER — NITROGLYCERIN 0.4 MG SL SUBL: 0.4000 mg | SUBLINGUAL_TABLET | SUBLINGUAL | Status: DC | PRN

## 2014-03-04 NOTE — Progress Notes (Signed)
History of Present Illness: 81 yo WM with h/o CAD s/p posterior inferior MI 2002 with placement of a bare metal stent in the circumflex artery and subsequent unstable angina August 2007 with placement of drug eluting stent in the mid LAD , also history of HTN, hyperlipidemia who is here today for follow up. He was admitted to [facility] 09/29/10 for chest pain. Cardiac enzymes were negative. Stress myoview as outpt 10/07/10 without ischemia.  He is here today for follow up. He has had no chest pain or SOB. No palpitations, dizziness, near syncope or syncope. He has been active. He continues to exercise.   Primary Care Physician: Plotnikov  Last Lipid Profile:Lipid Panel     Component Value Date/Time   CHOL 144 08/07/2013 1444   TRIG 73.0 08/07/2013 1444   HDL 79.90 08/07/2013 1444   CHOLHDL 2 08/07/2013 1444   VLDL 14.6 08/07/2013 1444   LDLCALC 50 08/07/2013 1444    Past Medical History  Diagnosis Date  . Coronary atherosclerosis of unspecified type of vessel, native or graft   . Unspecified essential hypertension   . Other and unspecified hyperlipidemia   . Hypertrophy of prostate without urinary obstruction and other lower urinary tract symptoms (LUTS)   . Thrombocytopenia, unspecified   . Osteoarthrosis, unspecified whether generalized or localized, unspecified site   . Dizziness and giddiness   . MI (myocardial infarction)     Past Surgical History  Procedure Laterality Date  . Colonoscopy    . Hip arthroplasty    . Transurethral resection of prostate      Current Outpatient Prescriptions  Medication Sig Dispense Refill  . aspirin 81 MG EC tablet Take 81 mg by mouth daily.      [name] b complex vitamins tablet Take 1 tablet by mouth daily.      . carvedilol (COREG) 6.25 MG tablet TAKE 1 TABLET BY MOUTH TWICE A DAY WITH MEALS 60 tablet 4  . Cholecalciferol 1000 UNITS tablet Take 1,000 Units by mouth daily.      . clopidogrel (PLAVIX) 75 MG tablet TAKE 1 TABLET BY  MOUTH EVERY DAY 30 tablet 3  . diphenoxylate-atropine (LOMOTIL) 2.5-0.025 MG per tablet Take 1 tablet by mouth 4 (four) times daily as needed for diarrhea or loose stools. 30 tablet 0  . Glucosamine-Chondroit-Vit C-Mn (GLUCOSAMINE 1500 COMPLEX PO) Take 1 tablet by mouth 2 (two) times daily.     [name] lisinopril (PRINIVIL,ZESTRIL) 10 MG tablet Take 1 tablet (10 mg total) by mouth 2 (two) times daily. 60 tablet 11  . nitroGLYCERIN (NITROSTAT) 0.4 MG SL tablet Place 1 tablet (0.4 mg total) under the tongue every 5 (five) minutes as needed for chest pain. 25 tablet 6  . ondansetron (ZOFRAN ODT) 8 MG disintegrating tablet Take 1 tablet (8 mg total) by mouth every 8 (eight) hours as needed for nausea or vomiting. 20 tablet 0  . simvastatin (ZOCOR) 80 MG tablet TAKE 1 TABLET BY MOUTH AT BEDTIME 30 tablet 3   No current facility-administered medications for this visit.    Allergies  Allergen Reactions  . Oxycodone Nausea And Vomiting  . Penicillins Hives    History   Social History  . Marital Status: Married    Spouse Name: N/A    Number of Children: N/A  . Years of Education: N/A   Occupational History  . Retired    Social History Main Topics  . Smoking status: Never Smoker   . Smokeless tobacco: Not on  file  . Alcohol Use: No  . Drug Use: No  . Sexual Activity: Not Currently   Other Topics Concern  . Not on file   Social History Narrative   Regular Exercise -  YES          Family History  Problem Relation Age of Onset  . Coronary artery disease Other   . Heart disease Father     Review of Systems:  As stated in the HPI and otherwise negative.   BP 100/50 mmHg  Pulse 52  Wt 159 lb 3.2 oz (72.213 kg)  SpO2 95%  Physical Examination: General: Well developed, well nourished, NAD HEENT: OP clear, mucus membranes moist SKIN: warm, dry. No rashes. Neuro: No focal deficits Musculoskeletal: Muscle strength 5/5 all ext Psychiatric: Mood and affect normal Neck: No JVD, no  carotid bruits, no thyromegaly, no lymphadenopathy. Lungs:Clear bilaterally, no wheezes, rhonci, crackles Cardiovascular: Loletha Grayer, regular rhythm. No murmurs, gallops or rubs. Abdomen:Soft. Bowel sounds present. Non-tender.  Extremities: No lower extremity edema. Pulses are 2 + in the bilateral DP/PT.  Assessment and Plan:   1. CORONARY ARTERY DISEASE: Stable. Continue ASA, beta blocker, Ace-inh, statin.  Lipids well controlled.   2. HYPERTENSION: BP well controlled. No changes

## 2014-03-04 NOTE — Patient Instructions (Signed)
Your physician wants you to follow-up in:  6 months. You will receive a reminder letter in the mail two months in advance. If you don't receive a letter, please call our office to schedule the follow-up appointment.   

## 2014-04-10 ENCOUNTER — Encounter: Payer: Self-pay | Admitting: Internal Medicine

## 2014-04-10 ENCOUNTER — Ambulatory Visit (INDEPENDENT_AMBULATORY_CARE_PROVIDER_SITE_OTHER): Payer: Medicare Other | Admitting: Internal Medicine

## 2014-04-10 VITALS — BP 120/77 | HR 53 | Temp 97.7°F | Wt 159.5 lb

## 2014-04-10 DIAGNOSIS — M153 Secondary multiple arthritis: Secondary | ICD-10-CM | POA: Diagnosis not present

## 2014-04-10 DIAGNOSIS — I5022 Chronic systolic (congestive) heart failure: Secondary | ICD-10-CM | POA: Diagnosis not present

## 2014-04-10 DIAGNOSIS — I10 Essential (primary) hypertension: Secondary | ICD-10-CM

## 2014-04-10 DIAGNOSIS — I2510 Atherosclerotic heart disease of native coronary artery without angina pectoris: Secondary | ICD-10-CM | POA: Diagnosis not present

## 2014-04-10 DIAGNOSIS — E785 Hyperlipidemia, unspecified: Secondary | ICD-10-CM | POA: Diagnosis not present

## 2014-04-10 NOTE — Assessment & Plan Note (Signed)
Chronic Lisinopril, Coreg

## 2014-04-10 NOTE — Progress Notes (Signed)
   Subjective:    HPI   The patient presents for a follow-up of  chronic hypertension, chronic dyslipidemia, CAD controlled with medicines He had dysphagia x 2 wks - Dr Orvis Brill Readings from Last 3 Encounters:  04/10/14 159 lb 8 oz (72.349 kg)  03/04/14 159 lb 3.2 oz (72.213 kg)  01/09/14 162 lb (73.483 kg)   BP Readings from Last 3 Encounters:  04/10/14 120/77  03/04/14 100/50  01/09/14 128/74      Review of Systems  Constitutional: Negative for appetite change, fatigue and unexpected weight change.  HENT: Negative for congestion, nosebleeds, sneezing, sore throat and trouble swallowing.   Eyes: Negative for itching and visual disturbance.  Respiratory: Negative for cough.   Cardiovascular: Negative for chest pain, palpitations and leg swelling.  Gastrointestinal: Negative for nausea, diarrhea, blood in stool and abdominal distention.  Genitourinary: Negative for frequency and hematuria.  Musculoskeletal: Negative for back pain, joint swelling, gait problem and neck pain.  Skin: Positive for rash.  Neurological: Negative for dizziness, tremors, speech difficulty and weakness.  Psychiatric/Behavioral: Negative for sleep disturbance, dysphoric mood and agitation. The patient is not nervous/anxious.        Objective:   Physical Exam  Constitutional: He is oriented to person, place, and time. He appears well-developed.  HENT:  Mouth/Throat: Oropharynx is clear and moist.  Eyes: Conjunctivae are normal. Pupils are equal, round, and reactive to light.  Neck: Normal range of motion. No JVD present. No thyromegaly present.  Cardiovascular: Normal rate, regular rhythm, normal heart sounds and intact distal pulses.  Exam reveals no gallop and no friction rub.   No murmur heard. Pulmonary/Chest: Effort normal and breath sounds normal. No respiratory distress. He has no wheezes. He has no rales. He exhibits no tenderness.  Abdominal: Soft. Bowel sounds are normal. He exhibits  no distension and no mass. There is no tenderness. There is no rebound and no guarding.  Musculoskeletal: Normal range of motion. He exhibits no edema or tenderness.  Lymphadenopathy:    He has no cervical adenopathy.  Neurological: He is alert and oriented to person, place, and time. He has normal reflexes. No cranial nerve deficit. He exhibits normal muscle tone. Coordination normal.  Skin: Skin is warm and dry. Rash (AKs, SKs) noted.  Psychiatric: He has a normal mood and affect. His behavior is normal. Judgment and thought content normal.     Lab Results  Component Value Date   WBC 11.3* 10/19/2013   HGB 16.6 10/19/2013   HCT 48.9 10/19/2013   PLT 192 10/19/2013   GLUCOSE 110* 01/09/2014   CHOL 144 08/07/2013   TRIG 73.0 08/07/2013   HDL 79.90 08/07/2013   LDLCALC 50 08/07/2013   ALT 17 10/19/2013   AST 27 10/19/2013   NA 134* 01/09/2014   K 4.7 01/09/2014   CL 99 01/09/2014   CREATININE 1.1 01/09/2014   BUN 16 01/09/2014   CO2 29 01/09/2014   TSH 2.74 08/07/2013   PSA 6.45* 08/07/2013   HGBA1C 6.4 12/05/2012        Assessment & Plan:

## 2014-04-10 NOTE — Assessment & Plan Note (Signed)
Tylenol prn 

## 2014-04-10 NOTE — Assessment & Plan Note (Signed)
On Simvastatin 

## 2014-04-10 NOTE — Progress Notes (Signed)
Pre visit review using our clinic review tool, if applicable. No additional management support is needed unless otherwise documented below in the visit note. 

## 2014-04-10 NOTE — Assessment & Plan Note (Signed)
Lisinopril, Coreg 

## 2014-04-16 ENCOUNTER — Encounter: Payer: Self-pay | Admitting: Gastroenterology

## 2014-04-16 ENCOUNTER — Ambulatory Visit (INDEPENDENT_AMBULATORY_CARE_PROVIDER_SITE_OTHER): Payer: Medicare Other | Admitting: Gastroenterology

## 2014-04-16 ENCOUNTER — Telehealth: Payer: Self-pay | Admitting: Cardiovascular Disease

## 2014-04-16 VITALS — BP 100/60 | HR 60 | Ht 68.0 in | Wt 158.2 lb

## 2014-04-16 DIAGNOSIS — R1310 Dysphagia, unspecified: Secondary | ICD-10-CM | POA: Diagnosis not present

## 2014-04-16 NOTE — Patient Instructions (Addendum)
You will be set up for an upper endoscopy for dysphagia (next week at hospital). We will contact Dr. Julianne Handler about holding your plavix for 5 days prior. Please start one omeprazole pill, 20 mg pill before breakfast every morning. Chew your food well, eat slowly and take small bites.[name]

## 2014-04-16 NOTE — Telephone Encounter (Signed)
Pt will need to stop Clopidogrel on April 17, 2014 for endoscopy on 3/21.  I spoke with pt and his wife and told them pt should not take Clopidogrel tomorrow morning and we will call him tomorrow with instructions from Dr. Angelena Form.  [facility] will also need to be notified of instructions.

## 2014-04-16 NOTE — Telephone Encounter (Signed)
New message      Request for surgical clearance:  What type of surgery is being performed? endoscopy 1. When is this surgery scheduled? 04-22-14  2. Are there any medications that need to be held prior to surgery and how long? plavix  (hold for 5 days)  3. Name of physician performing surgery? Dr Ardis Hughs  4. What is your office phone and fax number? Fax [phone]

## 2014-04-16 NOTE — Progress Notes (Signed)
HPI: This is a  very pleasant 81 year old man    who was referred to me by Plotnikov, Evie Lacks, MD  to evaluate  dysphasia    Solid dysphagia for past 2 months.  Has 'nearly choked' 3 times.  Overall stable weight.  Has to vomit to get food out.  This has occurred at least 3 times.  No pyrosis.  No esophageal problems in family.  No abx in past several months.  Teeth are fine.  Wife thinks he's lost 2-3 pounds.  He takes plavix since AMI in 2002.  Review of systems: Pertinent positive and negative review of systems were noted in the above HPI section. Complete review of systems was performed and was otherwise normal.    Past Medical History  Diagnosis Date  . Coronary atherosclerosis of unspecified type of vessel, native or graft   . Unspecified essential hypertension   . Other and unspecified hyperlipidemia   . Hypertrophy of prostate without urinary obstruction and other lower urinary tract symptoms (LUTS)   . Thrombocytopenia, unspecified   . Osteoarthrosis, unspecified whether generalized or localized, unspecified site   . Dizziness and giddiness   . MI (myocardial infarction)     Past Surgical History  Procedure Laterality Date  . Colonoscopy    . Hip arthroplasty Right 2006  . Transurethral resection of prostate      Current Outpatient Prescriptions  Medication Sig Dispense Refill  . aspirin 81 MG EC tablet Take 81 mg by mouth daily.      [name] b complex vitamins tablet Take 1 tablet by mouth daily.      . carvedilol (COREG) 6.25 MG tablet TAKE 1 TABLET BY MOUTH TWICE A DAY WITH MEALS 60 tablet 4  . Cholecalciferol 1000 UNITS tablet Take 1,000 Units by mouth daily.      . clopidogrel (PLAVIX) 75 MG tablet TAKE 1 TABLET BY MOUTH EVERY DAY 30 tablet 3  . Glucosamine-Chondroit-Vit C-Mn (GLUCOSAMINE 1500 COMPLEX PO) Take 1 tablet by mouth 2 (two) times daily.     [name] lisinopril (PRINIVIL,ZESTRIL) 10 MG tablet Take 1 tablet (10 mg total) by mouth 2 (two) times daily.  60 tablet 11  . nitroGLYCERIN (NITROSTAT) 0.4 MG SL tablet Place 1 tablet (0.4 mg total) under the tongue every 5 (five) minutes as needed for chest pain. 25 tablet 6  . simvastatin (ZOCOR) 80 MG tablet TAKE 1 TABLET BY MOUTH AT BEDTIME 30 tablet 3   No current facility-administered medications for this visit.    Allergies as of 04/16/2014 - Review Complete 04/16/2014  Allergen Reaction Noted  . Oxycodone Nausea And Vomiting 03/28/2012  . Penicillins Hives 04/14/2007    Family History  Problem Relation Age of Onset  . Coronary artery disease Other   . Heart disease Father   . Colon cancer Neg Hx     History   Social History  . Marital Status: Married    Spouse Name: [name]  . Number of Children: 2  . Years of Education: N/A   Occupational History  . Retired    Social History Main Topics  . Smoking status: Never Smoker   . Smokeless tobacco: Never Used  . Alcohol Use: No  . Drug Use: No  . Sexual Activity: Not Currently   Other Topics Concern  . Not on file   Social History Narrative   Regular Exercise -  YES             Physical Exam: BP 100/60 mmHg  Pulse 60  Ht 5\' 8"  (1.727 m)  Wt 158 lb 3.2 oz (71.759 kg)  BMI 24.06 kg/m2 Constitutional: generally well-appearing Psychiatric: alert and oriented x3 Eyes: extraocular movements intact Mouth: oral pharynx moist, no lesions Neck: supple no lymphadenopathy Cardiovascular: heart regular rate and rhythm Lungs: clear to auscultation bilaterally Abdomen: soft, nontender, nondistended, no obvious ascites, no peritoneal signs, normal bowel sounds Extremities: no lower extremity edema bilaterally Skin: no lesions on visible extremities    Assessment and plan: 81 y.o. male with  recent onset solid food dysphagia associated with some mild weight loss  He is on Plavix for coronary artery disease. I would like to proceed with EGD and possible dilation at the same time at his soonest convenience. We will contact  his cardiologist about the safety of him holding his blood thinner for 5 days prior to then. Between now no he will start once daily over-the-counter omeprazole and he will try to contract treated on chewing his food well eating slowly and taking small bites. My suspicion for neoplasm is moderate.   Cc: Plotnikov, Evie Lacks, MD

## 2014-04-17 NOTE — Telephone Encounter (Signed)
OK to hold Plavix for five days before his procedure. Can we let him know? Thanks, chris

## 2014-04-17 NOTE — Telephone Encounter (Signed)
Follow up     Calling to talk to a nurse regarding stopping plavix prior to procedure.  Pt said Fraser Din was going to call today with instructions from Dr Angelena Form.  Please call

## 2014-04-17 NOTE — Telephone Encounter (Signed)
Called patient back about Dr. Angelena Form note. Per Dr. Angelena Form, okay to hold plavix for five days before his procedure. Will route note to Dr. Edison Nasuti.

## 2014-04-19 ENCOUNTER — Encounter (HOSPITAL_COMMUNITY): Payer: Self-pay | Admitting: *Deleted

## 2014-04-22 ENCOUNTER — Ambulatory Visit (HOSPITAL_COMMUNITY): Payer: Medicare Other | Admitting: Anesthesiology

## 2014-04-22 ENCOUNTER — Encounter (HOSPITAL_COMMUNITY): Payer: Self-pay | Admitting: Anesthesiology

## 2014-04-22 ENCOUNTER — Encounter (HOSPITAL_COMMUNITY)
Admission: RE | Disposition: A | Discharge status: Home / Self Care | Payer: Self-pay | Source: Ambulatory Visit | Attending: Gastroenterology

## 2014-04-22 ENCOUNTER — Telehealth: Payer: Self-pay | Admitting: Gastroenterology

## 2014-04-22 ENCOUNTER — Ambulatory Visit (HOSPITAL_COMMUNITY)
Admission: RE | Admit: 2014-04-22 | Discharge: 2014-04-22 | Disposition: A | Discharge status: Home / Self Care | Payer: Medicare Other | Source: Ambulatory Visit | Attending: Gastroenterology | Admitting: Gastroenterology

## 2014-04-22 DIAGNOSIS — R634 Abnormal weight loss: Secondary | ICD-10-CM | POA: Insufficient documentation

## 2014-04-22 DIAGNOSIS — R1310 Dysphagia, unspecified: Secondary | ICD-10-CM | POA: Diagnosis not present

## 2014-04-22 DIAGNOSIS — K222 Esophageal obstruction: Secondary | ICD-10-CM | POA: Diagnosis not present

## 2014-04-22 DIAGNOSIS — Z7902 Long term (current) use of antithrombotics/antiplatelets: Secondary | ICD-10-CM | POA: Insufficient documentation

## 2014-04-22 DIAGNOSIS — E785 Hyperlipidemia, unspecified: Secondary | ICD-10-CM | POA: Diagnosis not present

## 2014-04-22 DIAGNOSIS — K209 Esophagitis, unspecified: Secondary | ICD-10-CM | POA: Diagnosis not present

## 2014-04-22 DIAGNOSIS — K449 Diaphragmatic hernia without obstruction or gangrene: Secondary | ICD-10-CM | POA: Diagnosis not present

## 2014-04-22 DIAGNOSIS — I10 Essential (primary) hypertension: Secondary | ICD-10-CM | POA: Diagnosis not present

## 2014-04-22 DIAGNOSIS — Z7982 Long term (current) use of aspirin: Secondary | ICD-10-CM | POA: Diagnosis not present

## 2014-04-22 DIAGNOSIS — I2510 Atherosclerotic heart disease of native coronary artery without angina pectoris: Secondary | ICD-10-CM | POA: Diagnosis not present

## 2014-04-22 DIAGNOSIS — I252 Old myocardial infarction: Secondary | ICD-10-CM | POA: Insufficient documentation

## 2014-04-22 DIAGNOSIS — M1990 Unspecified osteoarthritis, unspecified site: Secondary | ICD-10-CM | POA: Insufficient documentation

## 2014-04-22 DIAGNOSIS — M810 Age-related osteoporosis without current pathological fracture: Secondary | ICD-10-CM | POA: Diagnosis not present

## 2014-04-22 HISTORY — PX: ESOPHAGOGASTRODUODENOSCOPY: SHX5428

## 2014-04-22 HISTORY — DX: Reserved for inherently not codable concepts without codable children: IMO0001

## 2014-04-22 SURGERY — EGD (ESOPHAGOGASTRODUODENOSCOPY)
Anesthesia: Monitor Anesthesia Care

## 2014-04-22 MED ORDER — OMEPRAZOLE 40 MG PO CPDR: 40.0000 mg | DELAYED_RELEASE_CAPSULE | Freq: Two times a day (BID) | ORAL | Status: DC

## 2014-04-22 MED ORDER — SODIUM CHLORIDE 0.9 % IV SOLN
INTRAVENOUS | Status: DC
Start: 2014-04-22 — End: 2014-04-22

## 2014-04-22 MED ORDER — LACTATED RINGERS IV SOLN
INTRAVENOUS | Status: DC | PRN
  Administered 2014-04-22: 09:00:00 via INTRAVENOUS

## 2014-04-22 MED ORDER — BUTAMBEN-TETRACAINE-BENZOCAINE 2-2-14 % EX AERO
INHALATION_SPRAY | CUTANEOUS | Status: DC | PRN
  Administered 2014-04-22: 2 via TOPICAL

## 2014-04-22 MED ORDER — MIDAZOLAM HCL 5 MG/5ML IJ SOLN
INTRAMUSCULAR | Status: DC | PRN
  Administered 2014-04-22 (×2): 1 mg via INTRAVENOUS

## 2014-04-22 MED ORDER — LACTATED RINGERS IV SOLN
INTRAVENOUS | Status: DC
  Administered 2014-04-22: 1000 mL via INTRAVENOUS

## 2014-04-22 MED ORDER — LIDOCAINE HCL (CARDIAC) 20 MG/ML IV SOLN
INTRAVENOUS | Status: DC | PRN
  Administered 2014-04-22: 80 mg via INTRAVENOUS

## 2014-04-22 MED ORDER — PROPOFOL INFUSION 10 MG/ML OPTIME
INTRAVENOUS | Status: DC | PRN
  Administered 2014-04-22: 75 ug/kg/min via INTRAVENOUS

## 2014-04-22 MED ORDER — PROPOFOL 10 MG/ML IV BOLUS
INTRAVENOUS | Status: DC | PRN
  Administered 2014-04-22 (×2): 10 mg via INTRAVENOUS

## 2014-04-22 NOTE — Anesthesia Postprocedure Evaluation (Signed)
  Anesthesia Post-op Note  Patient: [name]  Procedure(s) Performed: Procedure(s): ESOPHAGOGASTRODUODENOSCOPY (EGD) (N/A)  Patient Location: PACU  Anesthesia Type:MAC  Level of Consciousness: awake and alert   Airway and Oxygen Therapy: Patient Spontanous Breathing  Post-op Pain: none  Post-op Assessment: Post-op Vital signs reviewed  Post-op Vital Signs: stable  Last Vitals:  Filed Vitals:   04/22/14 1030  BP: 132/55  Pulse: 46  Temp:   Resp: 23    Complications: No apparent anesthesia complications

## 2014-04-22 NOTE — H&P (View-Only) (Signed)
HPI: This is a  very pleasant 81 year old man    who was referred to me by Plotnikov, Evie Lacks, MD  to evaluate  dysphasia    Solid dysphagia for past 2 months.  Has 'nearly choked' 3 times.  Overall stable weight.  Has to vomit to get food out.  This has occurred at least 3 times.  No pyrosis.  No esophageal problems in family.  No abx in past several months.  Teeth are fine.  Wife thinks he's lost 2-3 pounds.  He takes plavix since AMI in 2002.  Review of systems: Pertinent positive and negative review of systems were noted in the above HPI section. Complete review of systems was performed and was otherwise normal.    Past Medical History  Diagnosis Date  . Coronary atherosclerosis of unspecified type of vessel, native or graft   . Unspecified essential hypertension   . Other and unspecified hyperlipidemia   . Hypertrophy of prostate without urinary obstruction and other lower urinary tract symptoms (LUTS)   . Thrombocytopenia, unspecified   . Osteoarthrosis, unspecified whether generalized or localized, unspecified site   . Dizziness and giddiness   . MI (myocardial infarction)     Past Surgical History  Procedure Laterality Date  . Colonoscopy    . Hip arthroplasty Right 2006  . Transurethral resection of prostate      Current Outpatient Prescriptions  Medication Sig Dispense Refill  . aspirin 81 MG EC tablet Take 81 mg by mouth daily.      [name] b complex vitamins tablet Take 1 tablet by mouth daily.      . carvedilol (COREG) 6.25 MG tablet TAKE 1 TABLET BY MOUTH TWICE A DAY WITH MEALS 60 tablet 4  . Cholecalciferol 1000 UNITS tablet Take 1,000 Units by mouth daily.      . clopidogrel (PLAVIX) 75 MG tablet TAKE 1 TABLET BY MOUTH EVERY DAY 30 tablet 3  . Glucosamine-Chondroit-Vit C-Mn (GLUCOSAMINE 1500 COMPLEX PO) Take 1 tablet by mouth 2 (two) times daily.     [name] lisinopril (PRINIVIL,ZESTRIL) 10 MG tablet Take 1 tablet (10 mg total) by mouth 2 (two) times daily.  60 tablet 11  . nitroGLYCERIN (NITROSTAT) 0.4 MG SL tablet Place 1 tablet (0.4 mg total) under the tongue every 5 (five) minutes as needed for chest pain. 25 tablet 6  . simvastatin (ZOCOR) 80 MG tablet TAKE 1 TABLET BY MOUTH AT BEDTIME 30 tablet 3   No current facility-administered medications for this visit.    Allergies as of 04/16/2014 - Review Complete 04/16/2014  Allergen Reaction Noted  . Oxycodone Nausea And Vomiting 03/28/2012  . Penicillins Hives 04/14/2007    Family History  Problem Relation Age of Onset  . Coronary artery disease Other   . Heart disease Father   . Colon cancer Neg Hx     History   Social History  . Marital Status: Married    Spouse Name: [name]  . Number of Children: 2  . Years of Education: N/A   Occupational History  . Retired    Social History Main Topics  . Smoking status: Never Smoker   . Smokeless tobacco: Never Used  . Alcohol Use: No  . Drug Use: No  . Sexual Activity: Not Currently   Other Topics Concern  . Not on file   Social History Narrative   Regular Exercise -  YES             Physical Exam: BP 100/60 mmHg  Pulse 60  Ht 5\' 8"  (1.727 m)  Wt 158 lb 3.2 oz (71.759 kg)  BMI 24.06 kg/m2 Constitutional: generally well-appearing Psychiatric: alert and oriented x3 Eyes: extraocular movements intact Mouth: oral pharynx moist, no lesions Neck: supple no lymphadenopathy Cardiovascular: heart regular rate and rhythm Lungs: clear to auscultation bilaterally Abdomen: soft, nontender, nondistended, no obvious ascites, no peritoneal signs, normal bowel sounds Extremities: no lower extremity edema bilaterally Skin: no lesions on visible extremities    Assessment and plan: 81 y.o. male with  recent onset solid food dysphagia associated with some mild weight loss  He is on Plavix for coronary artery disease. I would like to proceed with EGD and possible dilation at the same time at his soonest convenience. We will contact  his cardiologist about the safety of him holding his blood thinner for 5 days prior to then. Between now no he will start once daily over-the-counter omeprazole and he will try to contract treated on chewing his food well eating slowly and taking small bites. My suspicion for neoplasm is moderate.   Cc: Plotnikov, Evie Lacks, MD

## 2014-04-22 NOTE — Discharge Instructions (Signed)
YOU HAD AN ENDOSCOPIC PROCEDURE TODAY: Refer to the procedure report that was given to you for any specific questions about what was found during the examination.  If the procedure report does not answer your questions, please call your gastroenterologist to clarify. ° °YOU SHOULD EXPECT: Some feelings of bloating in the abdomen. Passage of more gas than usual.  Walking can help get rid of the air that was put into your GI tract during the procedure and reduce the bloating. If you had a lower endoscopy (such as a colonoscopy or flexible sigmoidoscopy) you may notice spotting of blood in your stool or on the toilet paper.  ° °DIET: Your first meal following the procedure should be a light meal and then it is ok to progress to your normal diet.  A half-sandwich or bowl of soup is an example of a good first meal.  Heavy or fried foods are harder to digest and may make you feel nasueas or bloated.  Drink plenty of fluids but you should avoid alcoholic beverages for 24 hours. ° °ACTIVITY: Your care partner should take you home directly after the procedure.  You should plan to take it easy, moving slowly for the rest of the day.  You can resume normal activity the day after the procedure however you should NOT DRIVE or use heavy machinery for 24 hours (because of the sedation medicines used during the test).   ° °SYMPTOMS TO REPORT IMMEDIATELY  °A gastroenterologist can be reached at any hour.  Please call your doctor's office for any of the following symptoms: ° °· Following lower endoscopy (colonoscopy, flexible sigmoidoscopy) ° Excessive amounts of blood in the stool ° Significant tenderness, worsening of abdominal pains ° Swelling of the abdomen that is new, acute ° Fever of 100° or higher °· Following upper endoscopy (EGD, EUS, ERCP) ° Vomiting of blood or coffee ground material ° New, significant abdominal pain ° New, significant chest pain or pain under the shoulder blades ° Painful or persistently difficult  swallowing ° New shortness of breath ° Black, tarry-looking stools ° °FOLLOW UP: °If any biopsies were taken you will be contacted by phone or by letter within the next 1-3 weeks.  Call your gastroenterologist if you have not heard about the biopsies in 3 weeks.  °Please also call your gastroenterologist's office with any specific questions about appointments or follow up tests. ° °Conscious Sedation, Adult, Care After °Refer to this sheet in the next few weeks. These instructions provide you with information on caring for yourself after your procedure. Your health care provider may also give you more specific instructions. Your treatment has been planned according to current medical practices, but problems sometimes occur. Call your health care provider if you have any problems or questions after your procedure. °WHAT TO EXPECT AFTER THE PROCEDURE  °After your procedure: °· You may feel sleepy, clumsy, and have poor balance for several hours. °· Vomiting may occur if you eat too soon after the procedure. °HOME CARE INSTRUCTIONS °· Do not participate in any activities where you could become injured for at least 24 hours. Do not: °¨ Drive. °¨ Swim. °¨ Ride a bicycle. °¨ Operate heavy machinery. °¨ Cook. °¨ Use power tools. °¨ Climb ladders. °¨ Work from a high place. °· Do not make important decisions or sign legal documents until you are improved. °· If you vomit, drink water, juice, or soup when you can drink without vomiting. Make sure you have little or no nausea before eating   solid foods. °· Only take over-the-counter or prescription medicines for pain, discomfort, or fever as directed by your health care provider. °· Make sure you and your family fully understand everything about the medicines given to you, including what side effects may occur. °· You should not drink alcohol, take sleeping pills, or take medicines that cause drowsiness for at least 24 hours. °· If you smoke, do not smoke without  supervision. °· If you are feeling better, you may resume normal activities 24 hours after you were sedated. °· Keep all appointments with your health care provider. °SEEK MEDICAL CARE IF: °· Your skin is pale or bluish in color. °· You continue to feel nauseous or vomit. °· Your pain is getting worse and is not helped by medicine. °· You have bleeding or swelling. °· You are still sleepy or feeling clumsy after 24 hours. °SEEK IMMEDIATE MEDICAL CARE IF: °· You develop a rash. °· You have difficulty breathing. °· You develop any type of allergic problem. °· You have a fever. °MAKE SURE YOU: °· Understand these instructions. °· Will watch your condition. °· Will get help right away if you are not doing well or get worse. °Document Released: 11/08/2012 Document Reviewed: 11/08/2012 °[employer]® Patient Information ©2015 [employer]. This information is not intended to replace advice given to you by your health care provider. Make sure you discuss any questions you have with your health care provider. ° °

## 2014-04-22 NOTE — Telephone Encounter (Signed)
Pt scheduled to see Dr. Ardis Hughs per procedure report 06/18/14@1 :30pm. Pt aware of appt.

## 2014-04-22 NOTE — Interval H&P Note (Signed)
History and Physical Interval Note:  04/22/2014 8:59 AM  [name]  has presented today for surgery, with the diagnosis of dysphagia  The various methods of treatment have been discussed with the patient and family. After consideration of risks, benefits and other options for treatment, the patient has consented to  Procedure(s): ESOPHAGOGASTRODUODENOSCOPY (EGD) (N/A) as a surgical intervention .  The patient's history has been reviewed, patient examined, no change in status, stable for surgery.  I have reviewed the patient's chart and labs.  Questions were answered to the patient's satisfaction.     Milus Banister

## 2014-04-22 NOTE — Anesthesia Preprocedure Evaluation (Addendum)
Anesthesia Evaluation  Patient identified by MRN, date of birth, ID band  Reviewed: Allergy & Precautions, NPO status , Patient's Chart, lab work & pertinent test results  Airway Mallampati: II   Neck ROM: Full    Dental  (+) Teeth Intact   Pulmonary shortness of breath,  breath sounds clear to auscultation        Cardiovascular hypertension, + CAD, + Past MI and +CHF Rhythm:Regular Rate:Normal     Neuro/Psych    GI/Hepatic   Endo/Other    Renal/GU      Musculoskeletal  (+) Arthritis -,   Abdominal   Peds  Hematology   Anesthesia Other Findings   Reproductive/Obstetrics                            Anesthesia Physical Anesthesia Plan  ASA: III  Anesthesia Plan: MAC   Post-op Pain Management:    Induction: Intravenous  Airway Management Planned: Natural Airway and Nasal Cannula  Additional Equipment:   Intra-op Plan:   Post-operative Plan:   Informed Consent: I have reviewed the patients History and Physical, chart, labs and discussed the procedure including the risks, benefits and alternatives for the proposed anesthesia with the patient or authorized representative who has indicated his/her understanding and acceptance.     Plan Discussed with:   Anesthesia Plan Comments:         Anesthesia Quick Evaluation

## 2014-04-22 NOTE — Transfer of Care (Signed)
Immediate Anesthesia Transfer of Care Note  Patient: [name]  Procedure(s) Performed: Procedure(s): ESOPHAGOGASTRODUODENOSCOPY (EGD) (N/A)  Patient Location: Endoscopy Unit  Anesthesia Type:MAC  Level of Consciousness: awake, alert , oriented and patient cooperative  Airway & Oxygen Therapy: Patient Spontanous Breathing and Patient connected to face mask oxygen  Post-op Assessment: Report given to RN and Post -op Vital signs reviewed and stable  Post vital signs: Reviewed and stable  Last Vitals:  Filed Vitals:   04/22/14 0813  BP: 162/86  Temp: 36.4 C  Resp: 17    Complications: No apparent anesthesia complications

## 2014-04-22 NOTE — Op Note (Signed)
[facility] [place], [address]   ENDOSCOPY PROCEDURE REPORT  PATIENT: [name], [name]  MR#: [id number] BIRTHDATE: 1923/04/26 , 33  yrs. old GENDER: male ENDOSCOPIST: Milus Banister, MD REFERRED BY:  Creig Hines, M.D. PROCEDURE DATE:  04/22/2014 PROCEDURE:  EGD w/ balloon dilation ASA CLASS:     Class IV INDICATIONS:  dysphagia, mild weight loss. MEDICATIONS: Monitored anesthesia care TOPICAL ANESTHETIC: none  DESCRIPTION OF PROCEDURE: After the risks benefits and alternatives of the procedure were thoroughly explained, informed consent was obtained.  The PENTAX GASTOROSCOPE [id number] endoscope was introduced through the mouth and advanced to the second portion of the duodenum , Without limitations.  The instrument was slowly withdrawn as the mucosa was fully examined.  Images were taken but could not be saved, printed due to technical issue with processor   There was extensive desquamation of the esophagus (EDS?).  The GE junction was smooth and somewhat strictured.  The lumen was 7-40mm across, unable to passed with standard adult gastroscope until after dilation.  There was no neoplastic appearing mucosa.  There was a 3cm hiatal hernia without Cameron's erosions.  The examination was otherwise normal.  The GE junction was dilated usinga CRE TTS ballon held inflated to 45mm.  There was the usual superficial mucosal tear and self limited oozing of blood following dilation.  Retroflexed views revealed no abnormalities.     The scope was then withdrawn from the patient and the procedure completed.  COplotMPLICATIONS: There were no immediate complications.  ENDOSCOPIC IMPRESSION: There was extensive desquamation of the esophagus (EDS?).  The GE junction was smooth and somewhat strictured.  The lumen was 7-26mm across, unable to passed with standard adult gastroscope until after dilation.  There was no neoplastic appearing mucosa.   There was a 3cm hiatal hernia without Cameron's erosions.  The examination was otherwise normal.  The GE junction was dilated usinga CRE TTS ballon held inflated to 75mm.  There was the usual superficial mucosal tear and self limited oozing of blood following dilation  RECOMMENDATIONS: He just started once daily OTC PPI last week after office visit.  I am calling him in BID prescription strenght.  He will also follow up with me in office in 7-8 weeks.  He knows to restart plavix tomorrow.  eSigned:  Milus Banister, MD 04/22/2014 10:06 AM

## 2014-04-23 ENCOUNTER — Encounter (HOSPITAL_COMMUNITY): Payer: Self-pay | Admitting: Gastroenterology

## 2014-04-30 ENCOUNTER — Other Ambulatory Visit: Payer: Self-pay | Admitting: Cardiovascular Disease

## 2014-05-10 DIAGNOSIS — K208 Other esophagitis without bleeding: Secondary | ICD-10-CM | POA: Insufficient documentation

## 2014-05-13 ENCOUNTER — Telehealth: Payer: Self-pay | Admitting: *Deleted

## 2014-05-13 NOTE — Telephone Encounter (Signed)
[employer] called to advise Dr. Ardis Hughs prescribed  Omeprazole 40 mg twice daily. The representative said we did not include any information as to why he wants the patient to take twice daily.  They denied this medication for twice daily due to  Quanity limit exception.   I asked if we provided why we want it twice daily would they reconsider and she said no. We would have to then go through the appeal process and she couldn't guarantee it would get approved for twice daily.

## 2014-05-14 NOTE — Telephone Encounter (Signed)
Please call wife [name] in the morning about meds.  Thanks Amy

## 2014-05-14 NOTE — Telephone Encounter (Signed)
The pt will call the insurance company and find out what they will cover, I also refaxed the prior auth/quanity exemption.

## 2014-05-15 ENCOUNTER — Telehealth: Payer: Self-pay | Admitting: Gastroenterology

## 2014-05-15 MED ORDER — PANTOPRAZOLE SODIUM 40 MG PO TBEC: 40.0000 mg | DELAYED_RELEASE_TABLET | Freq: Every day | ORAL | Status: DC

## 2014-05-15 NOTE — Telephone Encounter (Signed)
Spoke with patient's wife and insurance will pay for Pantoprazole 40 mg daily. Is this ok?

## 2014-05-15 NOTE — Telephone Encounter (Signed)
Yes, that is fine.  protonix 40mg  pill, one pill daily, disp 30 with 11 refills. thanks

## 2014-05-15 NOTE — Telephone Encounter (Signed)
Rx sent. Patient's wife to check with pharmacy as to when rx is ready.

## 2014-06-11 ENCOUNTER — Other Ambulatory Visit: Payer: Self-pay | Admitting: Cardiovascular Disease

## 2014-06-18 ENCOUNTER — Ambulatory Visit (INDEPENDENT_AMBULATORY_CARE_PROVIDER_SITE_OTHER): Payer: Medicare Other | Admitting: Gastroenterology

## 2014-06-18 ENCOUNTER — Encounter: Payer: Self-pay | Admitting: Gastroenterology

## 2014-06-18 VITALS — BP 106/60 | HR 60 | Ht 65.5 in | Wt 158.5 lb

## 2014-06-18 DIAGNOSIS — R1314 Dysphagia, pharyngoesophageal phase: Secondary | ICD-10-CM

## 2014-06-18 DIAGNOSIS — I2510 Atherosclerotic heart disease of native coronary artery without angina pectoris: Secondary | ICD-10-CM

## 2014-06-18 NOTE — Progress Notes (Signed)
Review of pertinent gastrointestinal problems: 1. Dysphagia, narrow GE junction: EGD 04/2014 Dr. Ardis Hughs There was extensive desquamation of the esophagus (EDS?). The GEjunction was smooth and somewhat strictured. The lumen was 7-69mm across, unable to be passed with standard adult gastroscope until after dilation. There was no neoplastic appearing mucosa. There was a 3cm hiatal hernia without Cameron's erosions. The examination was otherwise normal. The GE junction was dilated usinga CRE TTS ballon held inflated to 63mm. There was the usual superficial mucosal tear and self limited oozing of blood following dilation.  He was started on PPI, BID following EGD.  HPI: This is a   very pleasant 81 year old man who is here with his wife and granddaughter today.  Chief complaint is dysphagia  He has been fine since, EGD, dilation and initiation of proton pump inhibitor about 2 months ago.   Takes smaller bites, chews more.   While doing that and taking proton pump inhibitor once daily he has not had any further issues with dysphagia. No nausea or vomiting either.  His weight is stable   Past Medical History  Diagnosis Date  . Coronary atherosclerosis of unspecified type of vessel, native or graft   . Unspecified essential hypertension   . Other and unspecified hyperlipidemia   . Hypertrophy of prostate without urinary obstruction and other lower urinary tract symptoms (LUTS)   . Thrombocytopenia, unspecified   . Osteoarthrosis, unspecified whether generalized or localized, unspecified site   . Dizziness and giddiness   . MI (myocardial infarction)   . Shortness of breath dyspnea     with exertion    Past Surgical History  Procedure Laterality Date  . Colonoscopy    . Hip arthroplasty Right 2006  . Transurethral resection of prostate    . Coronary angioplasty  01/07/2001, 2008    2 stents  . Esophagogastroduodenoscopy N/A 04/22/2014    Procedure: ESOPHAGOGASTRODUODENOSCOPY (EGD);   Surgeon: Milus Banister, MD;  Location: [facility];  Service: Endoscopy;  Laterality: N/A;    Current Outpatient Prescriptions  Medication Sig Dispense Refill  . aspirin 81 MG EC tablet Take 81 mg by mouth daily.      [name] b complex vitamins tablet Take 1 tablet by mouth daily.      . carvedilol (COREG) 6.25 MG tablet TAKE 1 TABLET BY MOUTH TWICE A DAY WITH MEALS 60 tablet 3  . Cholecalciferol 1000 UNITS tablet Take 1,000 Units by mouth daily.      . clopidogrel (PLAVIX) 75 MG tablet TAKE 1 TABLET BY MOUTH EVERY DAY 30 tablet 3  . Glucosamine-Chondroit-Vit C-Mn (GLUCOSAMINE 1500 COMPLEX PO) Take 1 tablet by mouth 2 (two) times daily.     [name] lisinopril (PRINIVIL,ZESTRIL) 10 MG tablet Take 1 tablet (10 mg total) by mouth 2 (two) times daily. 60 tablet 11  . nitroGLYCERIN (NITROSTAT) 0.4 MG SL tablet Place 1 tablet (0.4 mg total) under the tongue every 5 (five) minutes as needed for chest pain. 25 tablet 6  . pantoprazole (PROTONIX) 40 MG tablet Take 1 tablet (40 mg total) by mouth daily. 30 tablet 11  . polyethylene glycol (MIRALAX / GLYCOLAX) packet Take 17 g by mouth daily.    . simvastatin (ZOCOR) 80 MG tablet TAKE 1 TABLET BY MOUTH AT BEDTIME 30 tablet 3   No current facility-administered medications for this visit.    Allergies as of 06/18/2014 - Review Complete 06/18/2014  Allergen Reaction Noted  . Oxycodone Nausea And Vomiting 03/28/2012  . Penicillins Hives 04/14/2007  Family History  Problem Relation Age of Onset  . Coronary artery disease Other   . Heart disease Father   . Colon cancer Neg Hx     History   Social History  . Marital Status: Married    Spouse Name: [name]  . Number of Children: 2  . Years of Education: N/A   Occupational History  . Retired    Social History Main Topics  . Smoking status: Never Smoker   . Smokeless tobacco: Never Used  . Alcohol Use: No  . Drug Use: No  . Sexual Activity: Not Currently   Other Topics Concern  . Not on file    Social History Narrative   Regular Exercise -  YES           Physical Exam: BP 106/60 mmHg  Pulse 60  Ht 5' 5.5" (1.664 m)  Wt 158 lb 8 oz (71.895 kg)  BMI 25.97 kg/m2 Constitutional: generally well-appearing Psychiatric: alert and oriented x3 Abdomen: soft, nontender, nondistended, no obvious ascites, no peritoneal signs, normal bowel sounds   Assessment and plan: 81 y.o. male with dysphasia  His dysphagia has improved since EGD with dilation and initiation of proton pump inhibitor. He did have a fairly narrowed GE junction. I am not concerned about neoplasm however he may have motility disturbance of the esophagus. At his age I do not recommend any further testing unless current measures do not continue to help him. He will call if that is the case.   Owens Loffler, MD [facility] 06/18/2014, 1:30 PM

## 2014-06-18 NOTE — Patient Instructions (Signed)
Continue chewing your food well, eat slowly and take small bites. Stay on pantoprazole once daily. Call if you have swallowing issues again.

## 2014-07-06 ENCOUNTER — Other Ambulatory Visit: Payer: Self-pay | Admitting: Cardiovascular Disease

## 2014-07-10 ENCOUNTER — Telehealth: Payer: Self-pay | Admitting: *Deleted

## 2014-07-10 NOTE — Telephone Encounter (Signed)
-----   Message from Jeanie Sewer sent at 07/10/2014  2:05 PM EDT ----- Pt was in with his wife today he states he has been having some chest pains in the last week and wants to see Dr Julianne Handler soon. Please advise.  I would be more than happy to call pt.  Davy Pique

## 2014-07-10 NOTE — Telephone Encounter (Signed)
[name], Can you check on him? We could get him in to see a PA/NP this week maybe or I could see him on the 23rd at 9:30am. I have no room tomorrow and I am out next week. Gerald Stabs

## 2014-07-10 NOTE — Telephone Encounter (Addendum)
Pt in today with wife and he stopped and spoke with Sonya at our check and mentioned that he had been having some chest pain last week. Spoke with pt and he states that for the last two weeks he has one episode each week of a pain, not sharp but a little more than dull, above his left breast. Pt states that it only lasts a few seconds because "I just pop it with my fist and it goes away". Pt did not have vital signs available. Pt states that he takes his meds as prescribed. Pt states that he did not take his Nitro at time of pain and didn't feel he needed to. Pt denies SOB, dizziness, lightheadedness, nausea, sweats or fatigue. Pt states that his wife feels like he needs to be seen. Will forward to Dr. Angelena Form and his nurse Fraser Din for review and follow up.

## 2014-07-11 NOTE — Telephone Encounter (Signed)
Pt is unavailable to talk with me. I spoke with his wife. She reports pt had 2 episodes as outlined below over past couple of weeks.  Pain goes away on it's own.   I told pt's wife pt could be seen by Richardson Dopp, PA this afternoon in office or with Dr. Angelena Form on June 23rd.  She spoke with pt and he would like to wait to see Dr. Angelena Form.  Appt made for pt to see Dr. Angelena Form on June 23,2016 at 9:30.  Wife aware if pt has increasing symptoms to let us know or go to ED

## 2014-07-17 ENCOUNTER — Encounter: Payer: Self-pay | Admitting: Internal Medicine

## 2014-07-17 ENCOUNTER — Ambulatory Visit (INDEPENDENT_AMBULATORY_CARE_PROVIDER_SITE_OTHER): Payer: Medicare Other | Admitting: Internal Medicine

## 2014-07-17 VITALS — BP 102/50 | HR 55 | Wt 158.0 lb

## 2014-07-17 DIAGNOSIS — I5020 Unspecified systolic (congestive) heart failure: Secondary | ICD-10-CM

## 2014-07-17 DIAGNOSIS — I2510 Atherosclerotic heart disease of native coronary artery without angina pectoris: Secondary | ICD-10-CM | POA: Diagnosis not present

## 2014-07-17 DIAGNOSIS — E785 Hyperlipidemia, unspecified: Secondary | ICD-10-CM

## 2014-07-17 DIAGNOSIS — I10 Essential (primary) hypertension: Secondary | ICD-10-CM

## 2014-07-17 MED ORDER — FINASTERIDE 5 MG PO TABS: 5.0000 mg | ORAL_TABLET | Freq: Every day | ORAL | Status: DC

## 2014-07-17 NOTE — Progress Notes (Signed)
Pre visit review using our clinic review tool, if applicable. No additional management support is needed unless otherwise documented below in the visit note. 

## 2014-07-17 NOTE — Assessment & Plan Note (Signed)
Will try Proscar

## 2014-07-17 NOTE — Assessment & Plan Note (Signed)
Chronic Lisinopril, Coreg

## 2014-07-17 NOTE — Progress Notes (Signed)
   Subjective:    HPI   The patient presents for a follow-up of  chronic hypertension, chronic dyslipidemia, CAD controlled with medicines He had dysphagia x 2 wks - Dr Ardis Hughs. C/o urinary frequency at night  Wt Readings from Last 3 Encounters:  07/17/14 158 lb (71.668 kg)  06/18/14 158 lb 8 oz (71.895 kg)  04/16/14 158 lb 3.2 oz (71.759 kg)   BP Readings from Last 3 Encounters:  07/17/14 102/50  06/18/14 106/60  04/22/14 132/55      Review of Systems  Constitutional: Negative for appetite change, fatigue and unexpected weight change.  HENT: Negative for congestion, nosebleeds, sneezing, sore throat and trouble swallowing.   Eyes: Negative for itching and visual disturbance.  Respiratory: Negative for cough.   Cardiovascular: Negative for chest pain, palpitations and leg swelling.  Gastrointestinal: Negative for nausea, diarrhea, blood in stool and abdominal distention.  Genitourinary: Negative for frequency and hematuria.  Musculoskeletal: Negative for back pain, joint swelling, gait problem and neck pain.  Skin: Positive for rash.  Neurological: Negative for dizziness, tremors, speech difficulty and weakness.  Psychiatric/Behavioral: Negative for sleep disturbance, dysphoric mood and agitation. The patient is not nervous/anxious.        Objective:   Physical Exam  Constitutional: He is oriented to person, place, and time. He appears well-developed.  HENT:  Mouth/Throat: Oropharynx is clear and moist.  Eyes: Conjunctivae are normal. Pupils are equal, round, and reactive to light.  Neck: Normal range of motion. No JVD present. No thyromegaly present.  Cardiovascular: Normal rate, regular rhythm, normal heart sounds and intact distal pulses.  Exam reveals no gallop and no friction rub.   No murmur heard. Pulmonary/Chest: Effort normal and breath sounds normal. No respiratory distress. He has no wheezes. He has no rales. He exhibits no tenderness.  Abdominal: Soft. Bowel  sounds are normal. He exhibits no distension and no mass. There is no tenderness. There is no rebound and no guarding.  Musculoskeletal: Normal range of motion. He exhibits no edema or tenderness.  Lymphadenopathy:    He has no cervical adenopathy.  Neurological: He is alert and oriented to person, place, and time. He has normal reflexes. No cranial nerve deficit. He exhibits normal muscle tone. Coordination normal.  Skin: Skin is warm and dry. Rash (AKs, SKs) noted.  Psychiatric: He has a normal mood and affect. His behavior is normal. Judgment and thought content normal.     Lab Results  Component Value Date   WBC 11.3* 10/19/2013   HGB 16.6 10/19/2013   HCT 48.9 10/19/2013   PLT 192 10/19/2013   GLUCOSE 110* 01/09/2014   CHOL 144 08/07/2013   TRIG 73.0 08/07/2013   HDL 79.90 08/07/2013   LDLCALC 50 08/07/2013   ALT 17 10/19/2013   AST 27 10/19/2013   NA 134* 01/09/2014   K 4.7 01/09/2014   CL 99 01/09/2014   CREATININE 1.1 01/09/2014   BUN 16 01/09/2014   CO2 29 01/09/2014   TSH 2.74 08/07/2013   PSA 6.45* 08/07/2013   HGBA1C 6.4 12/05/2012        Assessment & Plan:

## 2014-07-17 NOTE — Assessment & Plan Note (Signed)
Chronic  On Simvastatin 

## 2014-07-25 ENCOUNTER — Encounter: Payer: Self-pay | Admitting: Cardiovascular Disease

## 2014-07-25 ENCOUNTER — Ambulatory Visit (INDEPENDENT_AMBULATORY_CARE_PROVIDER_SITE_OTHER): Payer: Medicare Other | Admitting: Cardiovascular Disease

## 2014-07-25 VITALS — BP 128/86 | HR 56 | Ht 68.0 in | Wt 155.8 lb

## 2014-07-25 DIAGNOSIS — I2510 Atherosclerotic heart disease of native coronary artery without angina pectoris: Secondary | ICD-10-CM | POA: Diagnosis not present

## 2014-07-25 DIAGNOSIS — I10 Essential (primary) hypertension: Secondary | ICD-10-CM | POA: Diagnosis not present

## 2014-07-25 NOTE — Patient Instructions (Signed)
Medication Instructions:  Your physician recommends that you continue on your current medications as directed. Please refer to the Current Medication list given to you today.   Labwork: none  Testing/Procedures: none  Follow-Up: Your physician wants you to follow-up in: 6 months.  You will receive a reminder letter in the mail two months in advance. If you don't receive a letter, please call our office to schedule the follow-up appointment.       

## 2014-07-25 NOTE — Progress Notes (Signed)
Chief Complaint  Patient presents with  . Chest Pain    History of Present Illness: 81 yo WM with h/o CAD s/p posterior inferior MI 2002 with placement of a bare metal stent in the circumflex artery and subsequent unstable angina August 2007 with placement of drug eluting stent in the mid LAD , also history of HTN, hyperlipidemia who is here today for follow up. He was admitted to [facility] 09/29/10 for chest pain. Cardiac enzymes were negative. Stress myoview as outpt 10/07/10 without ischemia.  He is here today for follow up. He has had two episodes of sharp chest pain, center of chest, each lasting 1 second. Has had no exertional chest pain or SOB. No palpitations, dizziness, near syncope or syncope. He has been active. He continues to exercise.   Primary Care Physician: Plotnikov  Past Medical History  Diagnosis Date  . Coronary atherosclerosis of unspecified type of vessel, native or graft   . Unspecified essential hypertension   . Other and unspecified hyperlipidemia   . Hypertrophy of prostate without urinary obstruction and other lower urinary tract symptoms (LUTS)   . Thrombocytopenia, unspecified   . Osteoarthrosis, unspecified whether generalized or localized, unspecified site   . Dizziness and giddiness   . MI (myocardial infarction)   . Shortness of breath dyspnea     with exertion    Past Surgical History  Procedure Laterality Date  . Colonoscopy    . Hip arthroplasty Right 2006  . Transurethral resection of prostate    . Coronary angioplasty  01/07/2001, 2008    2 stents  . Esophagogastroduodenoscopy N/A 04/22/2014    Procedure: ESOPHAGOGASTRODUODENOSCOPY (EGD);  Surgeon: Milus Banister, MD;  Location: [place];  Service: Endoscopy;  Laterality: N/A;    Current Outpatient Prescriptions  Medication Sig Dispense Refill  . aspirin 81 MG EC tablet Take 81 mg by mouth daily.      [name] b complex vitamins tablet Take 1 tablet by mouth daily.      .  carvedilol (COREG) 6.25 MG tablet TAKE 1 TABLET BY MOUTH TWICE A DAY WITH MEALS 60 tablet 3  . Cholecalciferol 1000 UNITS tablet Take 1,000 Units by mouth daily.      . clopidogrel (PLAVIX) 75 MG tablet TAKE 1 TABLET BY MOUTH EVERY DAY 30 tablet 3  . finasteride (PROSCAR) 5 MG tablet Take 1 tablet (5 mg total) by mouth daily. 30 tablet 11  . Glucosamine-Chondroit-Vit C-Mn (GLUCOSAMINE 1500 COMPLEX PO) Take 1 tablet by mouth 2 (two) times daily.     [name] lisinopril (PRINIVIL,ZESTRIL) 10 MG tablet TAKE 1 TABLET (10 MG TOTAL) BY MOUTH 2 (TWO) TIMES DAILY. 60 tablet 1  . nitroGLYCERIN (NITROSTAT) 0.4 MG SL tablet Place 1 tablet (0.4 mg total) under the tongue every 5 (five) minutes as needed for chest pain. 25 tablet 6  . pantoprazole (PROTONIX) 40 MG tablet Take 1 tablet (40 mg total) by mouth daily. 30 tablet 11  . polyethylene glycol (MIRALAX / GLYCOLAX) packet Take 17 g by mouth daily.    . simvastatin (ZOCOR) 80 MG tablet TAKE 1 TABLET BY MOUTH AT BEDTIME 30 tablet 3   No current facility-administered medications for this visit.    Allergies  Allergen Reactions  . Oxycodone Nausea And Vomiting  . Penicillins Hives    History   Social History  . Marital Status: Married    Spouse Name: [name]  . Number of Children: 2  . Years of Education: N/A  Occupational History  . Retired    Social History Main Topics  . Smoking status: Never Smoker   . Smokeless tobacco: Never Used  . Alcohol Use: No  . Drug Use: No  . Sexual Activity: Not Currently   Other Topics Concern  . Not on file   Social History Narrative   Regular Exercise -  YES          Family History  Problem Relation Age of Onset  . Coronary artery disease Other   . Heart disease Father   . Colon cancer Neg Hx     Review of Systems:  As stated in the HPI and otherwise negative.   BP 128/86 mmHg  Pulse 56  Ht 5\' 8"  (1.727 m)  Wt 155 lb 12.8 oz (70.67 kg)  BMI 23.69 kg/m2  Physical Examination: General: Well  developed, well nourished, NAD HEENT: OP clear, mucus membranes moist SKIN: warm, dry. No rashes. Neuro: No focal deficits Musculoskeletal: Muscle strength 5/5 all ext Psychiatric: Mood and affect normal Neck: No JVD, no carotid bruits, no thyromegaly, no lymphadenopathy. Lungs:Clear bilaterally, no wheezes, rhonci, crackles Cardiovascular: Loletha Grayer, regular rhythm. No murmurs, gallops or rubs. Abdomen:Soft. Bowel sounds present. Non-tender.  Extremities: No lower extremity edema. Pulses are 2 + in the bilateral DP/PT.  EKG:  EKG is ordered today. The ekg ordered today demonstrates Sinus brady, rate 56 bpm. Chronic inferior T wave inversions.   Recent Labs: 08/07/2013: TSH 2.74 10/19/2013: ALT 17; Hemoglobin 16.6; Platelets 192 01/09/2014: BUN 16; Creatinine, Ser 1.1; Potassium 4.7; Sodium 134*   Lipid Panel    Component Value Date/Time   CHOL 144 08/07/2013 1444   TRIG 73.0 08/07/2013 1444   HDL 79.90 08/07/2013 1444   CHOLHDL 2 08/07/2013 1444   VLDL 14.6 08/07/2013 1444   LDLCALC 50 08/07/2013 1444     Wt Readings from Last 3 Encounters:  07/25/14 155 lb 12.8 oz (70.67 kg)  07/17/14 158 lb (71.668 kg)  06/18/14 158 lb 8 oz (71.895 kg)     Other studies Reviewed: Additional studies/ records that were reviewed today include: . Review of the above records demonstrates:    Assessment and Plan:   1. CORONARY ARTERY DISEASE: Stable. Chest pain does not sound cardiac. Continue ASA, beta blocker, Ace-inh, statin.  Lipids well controlled.   2. HYPERTENSION: BP well controlled. No changes  Current medicines are reviewed at length with the patient today.  The patient does not have concerns regarding medicines.  The following changes have been made:  no change  Labs/ tests ordered today include:   Orders Placed This Encounter  Procedures  . EKG 12-Lead    Disposition:   FU with me in 6 months  Signed, Lauree Chandler, MD 07/25/2014 11:52 AM    [facility] [address], [place], [place]  [address] Phone: [phone]; Fax: [phone]

## 2014-08-10 ENCOUNTER — Other Ambulatory Visit: Payer: Self-pay | Admitting: Cardiovascular Disease

## 2014-08-30 ENCOUNTER — Other Ambulatory Visit: Payer: Self-pay | Admitting: Cardiovascular Disease

## 2014-08-31 ENCOUNTER — Other Ambulatory Visit: Payer: Self-pay | Admitting: Cardiovascular Disease

## 2014-09-30 DIAGNOSIS — D485 Neoplasm of uncertain behavior of skin: Secondary | ICD-10-CM | POA: Diagnosis not present

## 2014-09-30 DIAGNOSIS — L720 Epidermal cyst: Secondary | ICD-10-CM | POA: Diagnosis not present

## 2014-09-30 DIAGNOSIS — L570 Actinic keratosis: Secondary | ICD-10-CM | POA: Diagnosis not present

## 2014-09-30 DIAGNOSIS — Z85828 Personal history of other malignant neoplasm of skin: Secondary | ICD-10-CM | POA: Diagnosis not present

## 2014-09-30 DIAGNOSIS — L820 Inflamed seborrheic keratosis: Secondary | ICD-10-CM | POA: Diagnosis not present

## 2014-09-30 DIAGNOSIS — L821 Other seborrheic keratosis: Secondary | ICD-10-CM | POA: Diagnosis not present

## 2014-10-18 ENCOUNTER — Ambulatory Visit: Payer: Medicare Other | Admitting: Internal Medicine

## 2014-10-21 ENCOUNTER — Other Ambulatory Visit: Payer: Self-pay | Admitting: Cardiovascular Disease

## 2014-10-28 ENCOUNTER — Encounter: Payer: Self-pay | Admitting: Internal Medicine

## 2014-10-28 ENCOUNTER — Other Ambulatory Visit (INDEPENDENT_AMBULATORY_CARE_PROVIDER_SITE_OTHER): Payer: Medicare Other

## 2014-10-28 ENCOUNTER — Ambulatory Visit (INDEPENDENT_AMBULATORY_CARE_PROVIDER_SITE_OTHER): Payer: Medicare Other | Admitting: Internal Medicine

## 2014-10-28 VITALS — BP 138/70 | HR 51 | Wt 161.0 lb

## 2014-10-28 DIAGNOSIS — M150 Primary generalized (osteo)arthritis: Secondary | ICD-10-CM

## 2014-10-28 DIAGNOSIS — I2510 Atherosclerotic heart disease of native coronary artery without angina pectoris: Secondary | ICD-10-CM

## 2014-10-28 DIAGNOSIS — M159 Polyosteoarthritis, unspecified: Secondary | ICD-10-CM

## 2014-10-28 DIAGNOSIS — Z23 Encounter for immunization: Secondary | ICD-10-CM | POA: Diagnosis not present

## 2014-10-28 DIAGNOSIS — M8949 Other hypertrophic osteoarthropathy, multiple sites: Secondary | ICD-10-CM

## 2014-10-28 DIAGNOSIS — E785 Hyperlipidemia, unspecified: Secondary | ICD-10-CM | POA: Diagnosis not present

## 2014-10-28 DIAGNOSIS — N400 Enlarged prostate without lower urinary tract symptoms: Secondary | ICD-10-CM | POA: Diagnosis not present

## 2014-10-28 LAB — BASIC METABOLIC PANEL
BUN: 16 mg/dL (ref 6–23)
CO2: 27 mEq/L (ref 19–32)
Calcium: 9.2 mg/dL (ref 8.4–10.5)
Chloride: 104 mEq/L (ref 96–112)
Creatinine, Ser: 1.04 mg/dL (ref 0.40–1.50)
GFR: 71.44 mL/min (ref >60.00)
Glucose, Bld: 101 mg/dL — ABNORMAL HIGH (ref 70–99)
Potassium: 4.2 mEq/L (ref 3.5–5.1)
Sodium: 139 mEq/L (ref 135–145)

## 2014-10-28 LAB — HEPATIC FUNCTION PANEL
ALT: 16 U/L (ref 0–53)
AST: 24 U/L (ref 0–37)
Albumin: 3.9 g/dL (ref 3.5–5.2)
Alkaline Phosphatase: 48 U/L (ref 39–117)
Bilirubin, Direct: 0.2 mg/dL (ref 0.0–0.3)
Total Bilirubin: 1.2 mg/dL (ref 0.2–1.2)
Total Protein: 7.2 g/dL (ref 6.0–8.3)

## 2014-10-28 MED ORDER — SIMVASTATIN 80 MG PO TABS
80.0000 mg | ORAL_TABLET | Freq: Every day | ORAL | Status: DC
Start: 2014-10-28 — End: 2015-01-07

## 2014-10-28 NOTE — Progress Notes (Signed)
Pre visit review using our clinic review tool, if applicable. No additional management support is needed unless otherwise documented below in the visit note. 

## 2014-10-28 NOTE — Assessment & Plan Note (Signed)
On Finesteride °

## 2014-10-28 NOTE — Assessment & Plan Note (Signed)
Chronic  On Simvastatin 

## 2014-10-28 NOTE — Progress Notes (Signed)
Subjective:  Patient ID: [name], male    DOB: May 26, 1923  Age: 81 y.o. MRN: [id number]  CC: No chief complaint on file.   HPI [name] presents for CAD, HTN, dyslipidemia f/u  Outpatient Prescriptions Prior to Visit  Medication Sig Dispense Refill  . aspirin 81 MG EC tablet Take 81 mg by mouth daily.      [name] b complex vitamins tablet Take 1 tablet by mouth daily.      . carvedilol (COREG) 6.25 MG tablet TAKE 1 TABLET BY MOUTH TWICE A DAY WITH MEALS 60 tablet 3  . Cholecalciferol 1000 UNITS tablet Take 1,000 Units by mouth daily.      . clopidogrel (PLAVIX) 75 MG tablet TAKE 1 TABLET BY MOUTH EVERY DAY 30 tablet 3  . finasteride (PROSCAR) 5 MG tablet Take 1 tablet (5 mg total) by mouth daily. 30 tablet 11  . Glucosamine-Chondroit-Vit C-Mn (GLUCOSAMINE 1500 COMPLEX PO) Take 1 tablet by mouth 2 (two) times daily.     [name] lisinopril (PRINIVIL,ZESTRIL) 10 MG tablet TAKE 1 TABLET (10 MG TOTAL) BY MOUTH 2 (TWO) TIMES DAILY. 60 tablet 5  . nitroGLYCERIN (NITROSTAT) 0.4 MG SL tablet Place 1 tablet (0.4 mg total) under the tongue every 5 (five) minutes as needed for chest pain. 25 tablet 6  . pantoprazole (PROTONIX) 40 MG tablet Take 1 tablet (40 mg total) by mouth daily. 30 tablet 11  . polyethylene glycol (MIRALAX / GLYCOLAX) packet Take 17 g by mouth daily.    . simvastatin (ZOCOR) 80 MG tablet TAKE 1 TABLET BY MOUTH AT BEDTIME 30 tablet 3   No facility-administered medications prior to visit.    ROS Review of Systems  Constitutional: Negative for appetite change, fatigue and unexpected weight change.  HENT: Negative for congestion, nosebleeds, sneezing, sore throat and trouble swallowing.   Eyes: Negative for itching and visual disturbance.  Respiratory: Negative for cough.   Cardiovascular: Negative for chest pain, palpitations and leg swelling.  Gastrointestinal: Negative for nausea, diarrhea, blood in stool and abdominal distention.  Genitourinary: Negative for frequency  and hematuria.  Musculoskeletal: Negative for back pain, joint swelling, gait problem and neck pain.  Skin: Negative for rash.  Neurological: Negative for dizziness, tremors, speech difficulty and weakness.  Psychiatric/Behavioral: Negative for suicidal ideas, sleep disturbance, dysphoric mood and agitation. The patient is not nervous/anxious.     Objective:  BP 138/70 mmHg  Pulse 51  Wt 161 lb (73.029 kg)  SpO2 98%  BP Readings from Last 3 Encounters:  10/28/14 138/70  07/25/14 128/86  07/17/14 102/50    Wt Readings from Last 3 Encounters:  10/28/14 161 lb (73.029 kg)  07/25/14 155 lb 12.8 oz (70.67 kg)  07/17/14 158 lb (71.668 kg)    Physical Exam  Constitutional: He is oriented to person, place, and time. He appears well-developed. No distress.  NAD  HENT:  Mouth/Throat: Oropharynx is clear and moist.  Eyes: Conjunctivae are normal. Pupils are equal, round, and reactive to light.  Neck: Normal range of motion. No JVD present. No thyromegaly present.  Cardiovascular: Normal rate, regular rhythm, normal heart sounds and intact distal pulses.  Exam reveals no gallop and no friction rub.   No murmur heard. Pulmonary/Chest: Effort normal and breath sounds normal. No respiratory distress. He has no wheezes. He has no rales. He exhibits no tenderness.  Abdominal: Soft. Bowel sounds are normal. He exhibits no distension and no mass. There is no tenderness. There is no rebound and  no guarding.  Musculoskeletal: Normal range of motion. He exhibits no edema or tenderness.  Lymphadenopathy:    He has no cervical adenopathy.  Neurological: He is alert and oriented to person, place, and time. He has normal reflexes. No cranial nerve deficit. He exhibits normal muscle tone. He displays a negative Romberg sign. Coordination and gait normal.  Skin: Skin is warm and dry. No rash noted.  Psychiatric: He has a normal mood and affect. His behavior is normal. Judgment and thought content  normal.    Lab Results  Component Value Date   WBC 11.3* 10/19/2013   HGB 16.6 10/19/2013   HCT 48.9 10/19/2013   PLT 192 10/19/2013   GLUCOSE 101* 10/28/2014   CHOL 144 08/07/2013   TRIG 73.0 08/07/2013   HDL 79.90 08/07/2013   LDLCALC 50 08/07/2013   ALT 16 10/28/2014   AST 24 10/28/2014   NA 139 10/28/2014   K 4.2 10/28/2014   CL 104 10/28/2014   CREATININE 1.04 10/28/2014   BUN 16 10/28/2014   CO2 27 10/28/2014   TSH 2.74 08/07/2013   PSA 6.45* 08/07/2013   HGBA1C 6.4 12/05/2012    No results found.  Assessment & Plan:   Diagnoses and all orders for this visit:  Dyslipidemia -     simvastatin (ZOCOR) 80 MG tablet; Take 1 tablet (80 mg total) by mouth at bedtime. -     Hepatic function panel; Future -     Basic metabolic panel; Future  Atherosclerosis of native coronary artery of native heart without angina pectoris -     Hepatic function panel; Future -     Basic metabolic panel; Future  Primary osteoarthritis involving multiple joints -     Hepatic function panel; Future -     Basic metabolic panel; Future  BPH (benign prostatic hyperplasia)  Need for influenza vaccination -     Flu Vaccine QUAD 36+ mos IM   I have changed Mr. [name] simvastatin. I am also having him maintain his aspirin, b complex vitamins, Glucosamine-Chondroit-Vit C-Mn (GLUCOSAMINE 1500 COMPLEX PO), Cholecalciferol, nitroGLYCERIN, polyethylene glycol, pantoprazole, finasteride, clopidogrel, lisinopril, and carvedilol.  Meds ordered this encounter  Medications  . simvastatin (ZOCOR) 80 MG tablet    Sig: Take 1 tablet (80 mg total) by mouth at bedtime.    Dispense:  90 tablet    Refill:  3     Follow-up: Return in about 3 months (around 01/27/2015) for a follow-up visit.  Walker Kehr, MD

## 2014-10-28 NOTE — Assessment & Plan Note (Signed)
No angina On Plavix, ASA, Coreg, Simvastatin

## 2014-10-28 NOTE — Assessment & Plan Note (Signed)
Tylenol prn 

## 2014-12-30 DIAGNOSIS — L720 Epidermal cyst: Secondary | ICD-10-CM | POA: Diagnosis not present

## 2014-12-30 DIAGNOSIS — Z85828 Personal history of other malignant neoplasm of skin: Secondary | ICD-10-CM | POA: Diagnosis not present

## 2015-01-02 ENCOUNTER — Other Ambulatory Visit: Payer: Self-pay | Admitting: Cardiovascular Disease

## 2015-01-06 ENCOUNTER — Telehealth: Payer: Self-pay | Admitting: Cardiovascular Disease

## 2015-01-06 NOTE — Telephone Encounter (Signed)
A representative from [facility] left a vm on the refill line this morning stating that the patient is going to be getting all of his prescriptions refilled through them. They are unable to receive e-scribe prescriptions and will need them printed and faxed to [phone]. Thanks, MI

## 2015-01-06 NOTE — Telephone Encounter (Signed)
°*  STAT* If patient is at the pharmacy, call can be transferred to refill team.   1. Which medications need to be refilled? (please list name of each medication and dose if known) Clopidogrel 75 mg tabs  2. Which pharmacy/location (including street and city if local pharmacy) is medication to be sent to? [facility] [id number]   3. Do they need a 30 day or 90 day supply?30 day supply   Comments: Fax to refill was sent on  01/02/2015 and 01/06/2015. And a VM was left.

## 2015-01-06 NOTE — Telephone Encounter (Signed)
Fax requesting refills for Lisinopril and Clopidogrel received in office.  Completed and faxed to [facility].

## 2015-01-07 ENCOUNTER — Other Ambulatory Visit: Payer: Self-pay

## 2015-01-07 DIAGNOSIS — E785 Hyperlipidemia, unspecified: Secondary | ICD-10-CM

## 2015-01-07 MED ORDER — SIMVASTATIN 80 MG PO TABS: 80.0000 mg | ORAL_TABLET | Freq: Every day | ORAL | Status: DC

## 2015-01-07 MED ORDER — FINASTERIDE 5 MG PO TABS: 5.0000 mg | ORAL_TABLET | Freq: Every day | ORAL | Status: DC

## 2015-01-09 ENCOUNTER — Other Ambulatory Visit: Payer: Self-pay | Admitting: *Deleted

## 2015-01-09 DIAGNOSIS — E785 Hyperlipidemia, unspecified: Secondary | ICD-10-CM

## 2015-01-09 MED ORDER — FINASTERIDE 5 MG PO TABS: 5.0000 mg | ORAL_TABLET | Freq: Every day | ORAL | Status: DC

## 2015-01-09 MED ORDER — SIMVASTATIN 80 MG PO TABS: 80.0000 mg | ORAL_TABLET | Freq: Every day | ORAL | Status: DC

## 2015-01-29 ENCOUNTER — Ambulatory Visit (INDEPENDENT_AMBULATORY_CARE_PROVIDER_SITE_OTHER): Payer: Medicare Other | Admitting: Internal Medicine

## 2015-01-29 ENCOUNTER — Other Ambulatory Visit (INDEPENDENT_AMBULATORY_CARE_PROVIDER_SITE_OTHER): Payer: Medicare Other

## 2015-01-29 ENCOUNTER — Encounter: Payer: Self-pay | Admitting: Internal Medicine

## 2015-01-29 VITALS — BP 140/80 | HR 49 | Wt 160.0 lb

## 2015-01-29 DIAGNOSIS — I5020 Unspecified systolic (congestive) heart failure: Secondary | ICD-10-CM | POA: Diagnosis not present

## 2015-01-29 DIAGNOSIS — E785 Hyperlipidemia, unspecified: Secondary | ICD-10-CM | POA: Diagnosis not present

## 2015-01-29 DIAGNOSIS — I2510 Atherosclerotic heart disease of native coronary artery without angina pectoris: Secondary | ICD-10-CM

## 2015-01-29 DIAGNOSIS — I10 Essential (primary) hypertension: Secondary | ICD-10-CM

## 2015-01-29 LAB — BASIC METABOLIC PANEL
BUN: 19 mg/dL (ref 6–23)
CO2: 27 mEq/L (ref 19–32)
Calcium: 9.5 mg/dL (ref 8.4–10.5)
Chloride: 105 mEq/L (ref 96–112)
Creatinine, Ser: 1.12 mg/dL (ref 0.40–1.50)
GFR: 65.55 mL/min (ref >60.00)
Glucose, Bld: 107 mg/dL — ABNORMAL HIGH (ref 70–99)
Potassium: 5.5 mEq/L — ABNORMAL HIGH (ref 3.5–5.1)
Sodium: 139 mEq/L (ref 135–145)

## 2015-01-29 MED ORDER — CLOPIDOGREL BISULFATE 75 MG PO TABS: 75.0000 mg | ORAL_TABLET | Freq: Every day | ORAL | Status: DC

## 2015-01-29 NOTE — Assessment & Plan Note (Signed)
Chronic Lisinopril, Coreg 

## 2015-01-29 NOTE — Assessment & Plan Note (Signed)
Chronic  On Simvastatin 

## 2015-01-29 NOTE — Progress Notes (Signed)
Pre visit review using our clinic review tool, if applicable. No additional management support is needed unless otherwise documented below in the visit note. 

## 2015-01-29 NOTE — Progress Notes (Signed)
Subjective:  Patient ID: [name], male    DOB: 1923/12/25  Age: 81 y.o. MRN: [id number]  CC: No chief complaint on file.   HPI [name] presents for CHF, HTN, GERD f/u  Outpatient Prescriptions Prior to Visit  Medication Sig Dispense Refill  . aspirin 81 MG EC tablet Take 81 mg by mouth daily.      [name] b complex vitamins tablet Take 1 tablet by mouth daily.      . carvedilol (COREG) 6.25 MG tablet TAKE 1 TABLET BY MOUTH TWICE A DAY WITH MEALS 60 tablet 3  . Cholecalciferol 1000 UNITS tablet Take 1,000 Units by mouth daily.      . clopidogrel (PLAVIX) 75 MG tablet TAKE 1 TABLET BY MOUTH EVERY DAY 30 tablet 5  . finasteride (PROSCAR) 5 MG tablet Take 1 tablet (5 mg total) by mouth daily. 30 tablet 11  . Glucosamine-Chondroit-Vit C-Mn (GLUCOSAMINE 1500 COMPLEX PO) Take 1 tablet by mouth 2 (two) times daily.     [name] lisinopril (PRINIVIL,ZESTRIL) 10 MG tablet TAKE 1 TABLET (10 MG TOTAL) BY MOUTH 2 (TWO) TIMES DAILY. 60 tablet 5  . nitroGLYCERIN (NITROSTAT) 0.4 MG SL tablet Place 1 tablet (0.4 mg total) under the tongue every 5 (five) minutes as needed for chest pain. 25 tablet 6  . pantoprazole (PROTONIX) 40 MG tablet Take 1 tablet (40 mg total) by mouth daily. 30 tablet 11  . polyethylene glycol (MIRALAX / GLYCOLAX) packet Take 17 g by mouth daily.    . simvastatin (ZOCOR) 80 MG tablet Take 1 tablet (80 mg total) by mouth at bedtime. 90 tablet 3   No facility-administered medications prior to visit.    ROS Review of Systems  Constitutional: Negative for appetite change, fatigue and unexpected weight change.  HENT: Negative for congestion, nosebleeds, sneezing, sore throat and trouble swallowing.   Eyes: Negative for itching and visual disturbance.  Respiratory: Positive for shortness of breath. Negative for cough.   Cardiovascular: Negative for chest pain, palpitations and leg swelling.  Gastrointestinal: Negative for nausea, diarrhea, blood in stool and abdominal distention.   Genitourinary: Negative for frequency and hematuria.  Musculoskeletal: Negative for back pain, joint swelling, gait problem and neck pain.  Skin: Negative for rash.  Neurological: Negative for dizziness, tremors, speech difficulty and weakness.  Psychiatric/Behavioral: Negative for suicidal ideas, sleep disturbance, dysphoric mood and agitation. The patient is not nervous/anxious.     Objective:  BP 140/80 mmHg  Pulse 49  Wt 160 lb (72.576 kg)  SpO2 96%  BP Readings from Last 3 Encounters:  01/29/15 140/80  10/28/14 138/70  07/25/14 128/86    Wt Readings from Last 3 Encounters:  01/29/15 160 lb (72.576 kg)  10/28/14 161 lb (73.029 kg)  07/25/14 155 lb 12.8 oz (70.67 kg)    Physical Exam  Constitutional: He is oriented to person, place, and time. He appears well-developed. No distress.  NAD  HENT:  Mouth/Throat: Oropharynx is clear and moist.  Eyes: Conjunctivae are normal. Pupils are equal, round, and reactive to light.  Neck: Normal range of motion. No JVD present. No thyromegaly present.  Cardiovascular: Normal rate, regular rhythm, normal heart sounds and intact distal pulses.  Exam reveals no gallop and no friction rub.   No murmur heard. Pulmonary/Chest: Effort normal and breath sounds normal. No respiratory distress. He has no wheezes. He has no rales. He exhibits no tenderness.  Abdominal: Soft. Bowel sounds are normal. He exhibits no distension and no mass. There  is no tenderness. There is no rebound and no guarding.  Musculoskeletal: Normal range of motion. He exhibits no edema or tenderness.  Lymphadenopathy:    He has no cervical adenopathy.  Neurological: He is alert and oriented to person, place, and time. He has normal reflexes. No cranial nerve deficit. He exhibits normal muscle tone. He displays a negative Romberg sign. Coordination and gait normal.  Skin: Skin is warm and dry. No rash noted.  Psychiatric: He has a normal mood and affect. His behavior is  normal. Judgment and thought content normal.    Lab Results  Component Value Date   WBC 11.3* 10/19/2013   HGB 16.6 10/19/2013   HCT 48.9 10/19/2013   PLT 192 10/19/2013   GLUCOSE 101* 10/28/2014   CHOL 144 08/07/2013   TRIG 73.0 08/07/2013   HDL 79.90 08/07/2013   LDLCALC 50 08/07/2013   ALT 16 10/28/2014   AST 24 10/28/2014   NA 139 10/28/2014   K 4.2 10/28/2014   CL 104 10/28/2014   CREATININE 1.04 10/28/2014   BUN 16 10/28/2014   CO2 27 10/28/2014   TSH 2.74 08/07/2013   PSA 6.45* 08/07/2013   HGBA1C 6.4 12/05/2012    No results found.  Assessment & Plan:   There are no diagnoses linked to this encounter. I am having [name] maintain his aspirin, b complex vitamins, Glucosamine-Chondroit-Vit C-Mn (GLUCOSAMINE 1500 COMPLEX PO), Cholecalciferol, nitroGLYCERIN, polyethylene glycol, pantoprazole, lisinopril, carvedilol, clopidogrel, finasteride, simvastatin, and cephALEXin.  Meds ordered this encounter  Medications  . cephALEXin (KEFLEX) 500 MG capsule    Sig: Take 500 mg by mouth 2 (two) times daily.    Refill:  0     Follow-up: No Follow-up on file.  Walker Kehr, MD

## 2015-03-04 ENCOUNTER — Telehealth: Payer: Self-pay | Admitting: *Deleted

## 2015-03-04 DIAGNOSIS — E785 Hyperlipidemia, unspecified: Secondary | ICD-10-CM

## 2015-03-04 MED ORDER — SIMVASTATIN 80 MG PO TABS: 80.0000 mg | ORAL_TABLET | Freq: Every day | ORAL | Status: DC

## 2015-03-04 NOTE — Telephone Encounter (Signed)
Left msg on triage stating pt is now receiving meds with them. Wanting to get a rx for Simvastatin 90 day. Can not take electronic scripts so rx has to be called in or fax to [phone]. Called [facility] gave verbal to refill Simvastatin...Johny Chess

## 2015-03-10 ENCOUNTER — Encounter: Payer: Self-pay | Admitting: Cardiovascular Disease

## 2015-03-10 ENCOUNTER — Ambulatory Visit (INDEPENDENT_AMBULATORY_CARE_PROVIDER_SITE_OTHER): Payer: Medicare Other | Admitting: Cardiovascular Disease

## 2015-03-10 VITALS — BP 106/62 | HR 50 | Wt 157.4 lb

## 2015-03-10 DIAGNOSIS — I10 Essential (primary) hypertension: Secondary | ICD-10-CM | POA: Diagnosis not present

## 2015-03-10 DIAGNOSIS — I2510 Atherosclerotic heart disease of native coronary artery without angina pectoris: Secondary | ICD-10-CM

## 2015-03-10 NOTE — Patient Instructions (Signed)

## 2015-03-10 NOTE — Progress Notes (Signed)
Chief Complaint  Patient presents with  . Follow-up  . Coronary Artery Disease    History of Present Illness: 82 yo WM with h/o CAD s/p posterior inferior MI 2002 with placement of a bare metal stent in the circumflex artery and subsequent unstable angina August 2007 with placement of drug eluting stent in the mid LAD , also history of HTN, hyperlipidemia who is here today for follow up. He was admitted to [facility] 09/29/10 for chest pain. Cardiac enzymes were negative. Stress myoview as outpt 10/07/10 without ischemia.  He is here today for follow up. Has had no exertional chest pain or SOB. No palpitations, dizziness, near syncope or syncope. He has been active. He continues to exercise. He has had a recent viral syndrome with dry cough. No fevers, chills or rigors.   Primary Care Physician: Plotnikov  Past Medical History  Diagnosis Date  . Coronary atherosclerosis of unspecified type of vessel, native or graft   . Unspecified essential hypertension   . Other and unspecified hyperlipidemia   . Hypertrophy of prostate without urinary obstruction and other lower urinary tract symptoms (LUTS)   . Thrombocytopenia, unspecified ([place])   . Osteoarthrosis, unspecified whether generalized or localized, unspecified site   . Dizziness and giddiness   . MI (myocardial infarction) ([place])   . Shortness of breath dyspnea     with exertion    Past Surgical History  Procedure Laterality Date  . Colonoscopy    . Hip arthroplasty Right 2006  . Transurethral resection of prostate    . Coronary angioplasty  01/07/2001, 2008    2 stents  . Esophagogastroduodenoscopy N/A 04/22/2014    Procedure: ESOPHAGOGASTRODUODENOSCOPY (EGD);  Surgeon: Milus Banister, MD;  Location: [place];  Service: Endoscopy;  Laterality: N/A;    Current Outpatient Prescriptions  Medication Sig Dispense Refill  . aspirin 81 MG EC tablet Take 81 mg by mouth daily.      [name] b complex vitamins tablet Take 1  tablet by mouth daily.      . carvedilol (COREG) 6.25 MG tablet TAKE 1 TABLET BY MOUTH TWICE A DAY WITH MEALS 60 tablet 3  . Cholecalciferol 1000 UNITS tablet Take 1,000 Units by mouth daily.      . clopidogrel (PLAVIX) 75 MG tablet Take 1 tablet (75 mg total) by mouth daily. 90 tablet 3  . finasteride (PROSCAR) 5 MG tablet Take 1 tablet (5 mg total) by mouth daily. 30 tablet 11  . Glucosamine-Chondroit-Vit C-Mn (GLUCOSAMINE 1500 COMPLEX PO) Take 1 tablet by mouth 2 (two) times daily.     [name] lisinopril (PRINIVIL,ZESTRIL) 10 MG tablet TAKE 1 TABLET (10 MG TOTAL) BY MOUTH 2 (TWO) TIMES DAILY. 60 tablet 5  . nitroGLYCERIN (NITROSTAT) 0.4 MG SL tablet Place 1 tablet (0.4 mg total) under the tongue every 5 (five) minutes as needed for chest pain. 25 tablet 6  . pantoprazole (PROTONIX) 40 MG tablet Take 1 tablet (40 mg total) by mouth daily. 30 tablet 11  . polyethylene glycol (MIRALAX / GLYCOLAX) packet Take 17 g by mouth daily.    . simvastatin (ZOCOR) 80 MG tablet Take 1 tablet (80 mg total) by mouth at bedtime. 90 tablet 3  . vitamin C (ASCORBIC ACID) 500 MG tablet Take 500 mg by mouth daily.     No current facility-administered medications for this visit.    Allergies  Allergen Reactions  . Oxycodone Nausea And Vomiting  . Penicillins Hives    Social History  Social History  . Marital Status: Married    Spouse Name: [name]  . Number of Children: 2  . Years of Education: N/A   Occupational History  . Retired    Social History Main Topics  . Smoking status: Never Smoker   . Smokeless tobacco: Never Used  . Alcohol Use: No  . Drug Use: No  . Sexual Activity: Not Currently   Other Topics Concern  . Not on file   Social History Narrative   Regular Exercise -  YES          Family History  Problem Relation Age of Onset  . Coronary artery disease Other   . Heart disease Father   . Colon cancer Neg Hx     Review of Systems:  As stated in the HPI and otherwise negative.    BP 106/62 mmHg  Pulse 50  Wt 157 lb 6.4 oz (71.396 kg)  SpO2 98%  Physical Examination: General: Well developed, well nourished, NAD HEENT: OP clear, mucus membranes moist SKIN: warm, dry. No rashes. Neuro: No focal deficits Musculoskeletal: Muscle strength 5/5 all ext Psychiatric: Mood and affect normal Neck: No JVD, no carotid bruits, no thyromegaly, no lymphadenopathy. Lungs:Clear bilaterally, no wheezes, rhonci, crackles Cardiovascular: Loletha Grayer, regular rhythm. No murmurs, gallops or rubs. Abdomen:Soft. Bowel sounds present. Non-tender.  Extremities: No lower extremity edema. Pulses are 2 + in the bilateral DP/PT.  EKG:  EKG is not ordered today. The ekg ordered today demonstrates   Recent Labs: 10/28/2014: ALT 16 01/29/2015: BUN 19; Creatinine, Ser 1.12; Potassium 5.5*; Sodium 139   Lipid Panel    Component Value Date/Time   CHOL 144 08/07/2013 1444   TRIG 73.0 08/07/2013 1444   HDL 79.90 08/07/2013 1444   CHOLHDL 2 08/07/2013 1444   VLDL 14.6 08/07/2013 1444   LDLCALC 50 08/07/2013 1444     Wt Readings from Last 3 Encounters:  03/10/15 157 lb 6.4 oz (71.396 kg)  01/29/15 160 lb (72.576 kg)  10/28/14 161 lb (73.029 kg)     Other studies Reviewed: Additional studies/ records that were reviewed today include: . Review of the above records demonstrates:    Assessment and Plan:   1. CORONARY ARTERY DISEASE: Stable. Continue ASA, beta blocker, Ace-inh, statin.    2. HYPERTENSION: BP well controlled. No changes  Current medicines are reviewed at length with the patient today.  The patient does not have concerns regarding medicines.  The following changes have been made:  no change  Labs/ tests ordered today include:   No orders of the defined types were placed in this encounter.    Disposition:   FU with me in 6 months  Signed, Lauree Chandler, MD 03/10/2015 4:13 PM    [facility] [address], [place], [place]   [address] Phone: [phone]; Fax: [phone]

## 2015-03-15 ENCOUNTER — Other Ambulatory Visit: Payer: Self-pay | Admitting: Cardiovascular Disease

## 2015-04-25 ENCOUNTER — Other Ambulatory Visit: Payer: Self-pay | Admitting: *Deleted

## 2015-04-25 MED ORDER — NITROGLYCERIN 0.4 MG SL SUBL: 0.4000 mg | SUBLINGUAL_TABLET | SUBLINGUAL | Status: DC | PRN

## 2015-04-28 ENCOUNTER — Encounter: Payer: Self-pay | Admitting: Internal Medicine

## 2015-04-28 ENCOUNTER — Ambulatory Visit (INDEPENDENT_AMBULATORY_CARE_PROVIDER_SITE_OTHER): Payer: Medicare Other | Admitting: Internal Medicine

## 2015-04-28 VITALS — BP 102/60 | HR 52 | Wt 159.0 lb

## 2015-04-28 DIAGNOSIS — I10 Essential (primary) hypertension: Secondary | ICD-10-CM

## 2015-04-28 DIAGNOSIS — I2510 Atherosclerotic heart disease of native coronary artery without angina pectoris: Secondary | ICD-10-CM | POA: Diagnosis not present

## 2015-04-28 DIAGNOSIS — B079 Viral wart, unspecified: Secondary | ICD-10-CM | POA: Insufficient documentation

## 2015-04-28 DIAGNOSIS — F4321 Adjustment disorder with depressed mood: Secondary | ICD-10-CM

## 2015-04-28 DIAGNOSIS — I5020 Unspecified systolic (congestive) heart failure: Secondary | ICD-10-CM

## 2015-04-28 DIAGNOSIS — E785 Hyperlipidemia, unspecified: Secondary | ICD-10-CM | POA: Diagnosis not present

## 2015-04-28 NOTE — Patient Instructions (Signed)
   Postprocedure instructions :     Keep the wounds clean. You can wash them with liquid soap and water. Pat dry with gauze or a Kleenex tissue  Before applying antibiotic ointment and a Band-Aid.   You need to report immediately  if  any signs of infection develop.    

## 2015-04-28 NOTE — Assessment & Plan Note (Signed)
x3 R shoulder See procedure

## 2015-04-28 NOTE — Progress Notes (Signed)
Pre visit review using our clinic review tool, if applicable. No additional management support is needed unless otherwise documented below in the visit note. 

## 2015-04-28 NOTE — Assessment & Plan Note (Signed)
Doing well now 

## 2015-04-28 NOTE — Assessment & Plan Note (Signed)
On Simvastatin 

## 2015-04-28 NOTE — Assessment & Plan Note (Signed)
Lisinopril, Coreg 

## 2015-04-28 NOTE — Assessment & Plan Note (Signed)
Lisinopril and Coreg

## 2015-04-28 NOTE — Progress Notes (Signed)
Subjective:  Patient ID: [name], male    DOB: 1923/03/25  Age: 82 y.o. MRN: [id number]  CC: No chief complaint on file.   HPI [name] presents for CHF, CAD, HTN f/u. Working out 3/week. C/o warts  Outpatient Prescriptions Prior to Visit  Medication Sig Dispense Refill  . aspirin 81 MG EC tablet Take 81 mg by mouth daily.      [name] b complex vitamins tablet Take 1 tablet by mouth daily.      . carvedilol (COREG) 6.25 MG tablet TAKE 1 TABLET BY MOUTH TWICE A DAY WITH MEALS 60 tablet 6  . Cholecalciferol 1000 UNITS tablet Take 1,000 Units by mouth daily.      . clopidogrel (PLAVIX) 75 MG tablet Take 1 tablet (75 mg total) by mouth daily. 90 tablet 3  . finasteride (PROSCAR) 5 MG tablet Take 1 tablet (5 mg total) by mouth daily. 30 tablet 11  . Glucosamine-Chondroit-Vit C-Mn (GLUCOSAMINE 1500 COMPLEX PO) Take 1 tablet by mouth 2 (two) times daily.     [name] lisinopril (PRINIVIL,ZESTRIL) 10 MG tablet TAKE 1 TABLET (10 MG TOTAL) BY MOUTH 2 (TWO) TIMES DAILY. 60 tablet 5  . nitroGLYCERIN (NITROSTAT) 0.4 MG SL tablet Place 1 tablet (0.4 mg total) under the tongue every 5 (five) minutes as needed for chest pain. 25 tablet 6  . pantoprazole (PROTONIX) 40 MG tablet Take 1 tablet (40 mg total) by mouth daily. 30 tablet 11  . polyethylene glycol (MIRALAX / GLYCOLAX) packet Take 17 g by mouth daily.    . simvastatin (ZOCOR) 80 MG tablet Take 1 tablet (80 mg total) by mouth at bedtime. 90 tablet 3  . vitamin C (ASCORBIC ACID) 500 MG tablet Take 500 mg by mouth daily.     No facility-administered medications prior to visit.    ROS Review of Systems  Constitutional: Negative for appetite change, fatigue and unexpected weight change.  HENT: Negative for congestion, nosebleeds, sneezing, sore throat and trouble swallowing.   Eyes: Negative for itching and visual disturbance.  Respiratory: Negative for cough.   Cardiovascular: Negative for chest pain, palpitations and leg swelling.    Gastrointestinal: Negative for nausea, diarrhea, blood in stool and abdominal distention.  Genitourinary: Negative for frequency and hematuria.  Musculoskeletal: Negative for back pain, joint swelling, gait problem and neck pain.  Skin: Negative for rash.  Neurological: Negative for dizziness, tremors, speech difficulty and weakness.  Psychiatric/Behavioral: Negative for suicidal ideas, sleep disturbance, dysphoric mood and agitation. The patient is not nervous/anxious.     Objective:  BP 102/60 mmHg  Pulse 52  Wt 159 lb (72.122 kg)  SpO2 96%  BP Readings from Last 3 Encounters:  04/28/15 102/60  03/10/15 106/62  01/29/15 140/80    Wt Readings from Last 3 Encounters:  04/28/15 159 lb (72.122 kg)  03/10/15 157 lb 6.4 oz (71.396 kg)  01/29/15 160 lb (72.576 kg)    Physical Exam  Constitutional: He is oriented to person, place, and time. He appears well-developed. No distress.  NAD  HENT:  Mouth/Throat: Oropharynx is clear and moist.  Eyes: Conjunctivae are normal. Pupils are equal, round, and reactive to light.  Neck: Normal range of motion. No JVD present. No thyromegaly present.  Cardiovascular: Normal rate, regular rhythm, normal heart sounds and intact distal pulses.  Exam reveals no gallop and no friction rub.   No murmur heard. Pulmonary/Chest: Effort normal and breath sounds normal. No respiratory distress. He has no wheezes. He has no  rales. He exhibits no tenderness.  Abdominal: Soft. Bowel sounds are normal. He exhibits no distension and no mass. There is no tenderness. There is no rebound and no guarding.  Musculoskeletal: Normal range of motion. He exhibits no edema or tenderness.  Lymphadenopathy:    He has no cervical adenopathy.  Neurological: He is alert and oriented to person, place, and time. He has normal reflexes. No cranial nerve deficit. He exhibits normal muscle tone. He displays a negative Romberg sign. Coordination and gait normal.  Skin: Skin is warm  and dry. No rash noted.  Psychiatric: He has a normal mood and affect. His behavior is normal. Judgment and thought content normal.  Warts R shoulder x3    Procedure Note :     Procedure : Cryosurgery   Indication:  Wart(s)     Risks including unsuccessful procedure , bleeding, infection, bruising, scar, a need for a repeat  procedure and others were explained to the patient in detail as well as the benefits. Informed consent was obtained verbally.   3  lesion(s)  on R arm  was/were treated with liquid nitrogen on a Q-tip in a usual fasion . Band-Aid was applied and antibiotic ointment was given for a later use.   Tolerated well. Complications none.   Postprocedure instructions :     Keep the wounds clean. You can wash them with liquid soap and water. Pat dry with gauze or a Kleenex tissue  Before applying antibiotic ointment and a Band-Aid.   You need to report immediately  if  any signs of infection develop.      Lab Results  Component Value Date   WBC 11.3* 10/19/2013   HGB 16.6 10/19/2013   HCT 48.9 10/19/2013   PLT 192 10/19/2013   GLUCOSE 107* 01/29/2015   CHOL 144 08/07/2013   TRIG 73.0 08/07/2013   HDL 79.90 08/07/2013   LDLCALC 50 08/07/2013   ALT 16 10/28/2014   AST 24 10/28/2014   NA 139 01/29/2015   K 5.5* 01/29/2015   CL 105 01/29/2015   CREATININE 1.12 01/29/2015   BUN 19 01/29/2015   CO2 27 01/29/2015   TSH 2.74 08/07/2013   PSA 6.45* 08/07/2013   HGBA1C 6.4 12/05/2012    No results found.  Assessment & Plan:   Diagnoses and all orders for this visit:  Atherosclerosis of native coronary artery of native heart without angina pectoris  Essential hypertension  Dyslipidemia  Systolic congestive heart failure, unspecified congestive heart failure chronicity (HCC)  Situational depression  I am having Mr. Tibbetts maintain his aspirin, b complex vitamins, Glucosamine-Chondroit-Vit C-Mn (GLUCOSAMINE 1500 COMPLEX PO), Cholecalciferol, polyethylene  glycol, pantoprazole, lisinopril, finasteride, clopidogrel, simvastatin, vitamin C, carvedilol, and nitroGLYCERIN.  No orders of the defined types were placed in this encounter.     Follow-up: Return in about 3 months (around 07/29/2015) for a follow-up visit.  Walker Kehr, MD

## 2015-04-28 NOTE — Assessment & Plan Note (Signed)
No angina On Plavix, ASA, Coreg, Simvastatin 

## 2015-04-29 ENCOUNTER — Other Ambulatory Visit: Payer: Self-pay | Admitting: *Deleted

## 2015-04-29 MED ORDER — NITROGLYCERIN 0.4 MG SL SUBL: 0.4000 mg | SUBLINGUAL_TABLET | SUBLINGUAL | Status: DC | PRN

## 2015-04-29 NOTE — Telephone Encounter (Signed)
Lect msg on triage stating pt is needing updated script on his Nitrostat rx. Rx that they have has expired. Pls fax to [phone] or call into [phone]. [facility]...Johny Chess

## 2015-06-12 DIAGNOSIS — H04223 Epiphora due to insufficient drainage, bilateral lacrimal glands: Secondary | ICD-10-CM | POA: Diagnosis not present

## 2015-06-12 DIAGNOSIS — H2513 Age-related nuclear cataract, bilateral: Secondary | ICD-10-CM | POA: Diagnosis not present

## 2015-06-12 DIAGNOSIS — Z0100 Encounter for examination of eyes and vision without abnormal findings: Secondary | ICD-10-CM | POA: Diagnosis not present

## 2015-07-15 ENCOUNTER — Telehealth: Payer: Self-pay | Admitting: Internal Medicine

## 2015-07-15 NOTE — Telephone Encounter (Signed)
NOTE: All timestamps contained within this report are represented as [place] Standard Time. CONFIDENTIALTY NOTICE: This fax transmission is intended only for the addressee. It contains information that is legally privileged, confidential or otherwise protected from use or disclosure. If you are not the intended recipient, you are strictly prohibited from reviewing, disclosing, copying using or disseminating any of this information or taking any action in reliance on or regarding this information. If you have received this fax in error, please notify us immediately by telephone so that we can arrange for its return to [place]. Phone: [phone], Toll-Free: [phone], Fax: [phone] Page: 1 of 1 Call Id: [id number] [facility] [place] Patient Name: [name] DOB: 1923/12/18 Initial Comment Caller states her husband has gone three days without bowel movement Nurse Assessment Nurse: Dimas Chyle, RN, Dellis Filbert Date/Time Eilene Ghazi Time): 07/15/2015 2:49:45 PM Confirm and document reason for call. If symptomatic, describe symptoms. You must click the next button to save text entered. ---Caller states her husband has gone three days without bowel movement. Has the patient traveled out of the country within the last 30 days? ---No Does the patient have any new or worsening symptoms? ---Yes Will a triage be completed? ---Yes Related visit to physician within the last 2 weeks? ---No Does the PT have any chronic conditions? (i.e. diabetes, asthma, etc.) ---Yes List chronic conditions. ---CAD, cardiac stents x 2 Is this a behavioral health or substance abuse call? ---No Guidelines Guideline Title Affirmed Question Affirmed Notes Constipation Over-The-Counter (OTC) medicines for mild constipation (all triage questions negative) Final Disposition User [place], RN, Dellis Filbert Disagree/Comply: Comply

## 2015-07-28 ENCOUNTER — Ambulatory Visit (INDEPENDENT_AMBULATORY_CARE_PROVIDER_SITE_OTHER): Payer: Medicare Other | Admitting: Internal Medicine

## 2015-07-28 ENCOUNTER — Encounter: Payer: Self-pay | Admitting: Internal Medicine

## 2015-07-28 ENCOUNTER — Other Ambulatory Visit (INDEPENDENT_AMBULATORY_CARE_PROVIDER_SITE_OTHER): Payer: Medicare Other

## 2015-07-28 VITALS — BP 120/70 | HR 57 | Wt 160.0 lb

## 2015-07-28 DIAGNOSIS — I2510 Atherosclerotic heart disease of native coronary artery without angina pectoris: Secondary | ICD-10-CM | POA: Diagnosis not present

## 2015-07-28 DIAGNOSIS — I5020 Unspecified systolic (congestive) heart failure: Secondary | ICD-10-CM

## 2015-07-28 DIAGNOSIS — I10 Essential (primary) hypertension: Secondary | ICD-10-CM

## 2015-07-28 DIAGNOSIS — E785 Hyperlipidemia, unspecified: Secondary | ICD-10-CM

## 2015-07-28 DIAGNOSIS — F4321 Adjustment disorder with depressed mood: Secondary | ICD-10-CM

## 2015-07-28 LAB — CBC WITH DIFFERENTIAL/PLATELET
Basophils Absolute: 0 10*3/uL (ref 0.0–0.1)
Basophils Relative: 0.3 % (ref 0.0–3.0)
Eosinophils Absolute: 0.2 10*3/uL (ref 0.0–0.7)
Eosinophils Relative: 2.6 % (ref 0.0–5.0)
HCT: 44.6 % (ref 39.0–52.0)
Hemoglobin: 14.7 g/dL (ref 13.0–17.0)
Lymphocytes Relative: 23.5 % (ref 12.0–46.0)
Lymphs Abs: 2 10*3/uL (ref 0.7–4.0)
MCHC: 33 g/dL (ref 30.0–36.0)
MCV: 93.3 fl (ref 78.0–100.0)
Monocytes Absolute: 0.7 10*3/uL (ref 0.1–1.0)
Monocytes Relative: 8.5 % (ref 3.0–12.0)
Neutro Abs: 5.5 10*3/uL (ref 1.4–7.7)
Neutrophils Relative %: 65.1 % (ref 43.0–77.0)
Platelets: 220 10*3/uL (ref 150.0–400.0)
RBC: 4.78 Mil/uL (ref 4.22–5.81)
RDW: 14.4 % (ref 11.5–15.5)
WBC: 8.4 10*3/uL (ref 4.0–10.5)

## 2015-07-28 LAB — TSH: TSH: 3.2 u[IU]/mL (ref 0.35–4.50)

## 2015-07-28 LAB — HEPATIC FUNCTION PANEL
ALT: 18 U/L (ref 0–53)
AST: 24 U/L (ref 0–37)
Albumin: 4 g/dL (ref 3.5–5.2)
Alkaline Phosphatase: 40 U/L (ref 39–117)
Bilirubin, Direct: 0.2 mg/dL (ref 0.0–0.3)
Total Bilirubin: 1.1 mg/dL (ref 0.2–1.2)
Total Protein: 7.4 g/dL (ref 6.0–8.3)

## 2015-07-28 LAB — LIPID PANEL
Cholesterol: 149 mg/dL (ref 0–200)
HDL: 74.1 mg/dL (ref >39.00)
LDL Cholesterol: 62 mg/dL (ref 0–99)
NonHDL: 75.07
Total CHOL/HDL Ratio: 2
Triglycerides: 65 mg/dL (ref 0.0–149.0)
VLDL: 13 mg/dL (ref 0.0–40.0)

## 2015-07-28 LAB — BASIC METABOLIC PANEL
BUN: 15 mg/dL (ref 6–23)
CO2: 30 mEq/L (ref 19–32)
Calcium: 9.6 mg/dL (ref 8.4–10.5)
Chloride: 104 mEq/L (ref 96–112)
Creatinine, Ser: 1.13 mg/dL (ref 0.40–1.50)
GFR: 64.81 mL/min (ref >60.00)
Glucose, Bld: 122 mg/dL — ABNORMAL HIGH (ref 70–99)
Potassium: 4.5 mEq/L (ref 3.5–5.1)
Sodium: 139 mEq/L (ref 135–145)

## 2015-07-28 MED ORDER — SIMVASTATIN 80 MG PO TABS: 80.0000 mg | ORAL_TABLET | Freq: Every day | ORAL | Status: DC

## 2015-07-28 MED ORDER — FINASTERIDE 5 MG PO TABS: 5.0000 mg | ORAL_TABLET | Freq: Every day | ORAL | Status: DC

## 2015-07-28 MED ORDER — NITROGLYCERIN 0.4 MG SL SUBL: 0.4000 mg | SUBLINGUAL_TABLET | SUBLINGUAL | Status: DC | PRN

## 2015-07-28 MED ORDER — CLOPIDOGREL BISULFATE 75 MG PO TABS: 75.0000 mg | ORAL_TABLET | Freq: Every day | ORAL | Status: DC

## 2015-07-28 NOTE — Progress Notes (Signed)
Pre visit review using our clinic review tool, if applicable. No additional management support is needed unless otherwise documented below in the visit note. 

## 2015-07-28 NOTE — Assessment & Plan Note (Signed)
On Plavix, ASA, Coreg, Simvastatin 

## 2015-07-28 NOTE — Assessment & Plan Note (Signed)
Lisinopril, Coreg 

## 2015-07-28 NOTE — Assessment & Plan Note (Signed)
[name] has broken her R leg - open fx

## 2015-10-20 ENCOUNTER — Telehealth: Payer: Self-pay | Admitting: *Deleted

## 2015-10-20 DIAGNOSIS — E785 Hyperlipidemia, unspecified: Secondary | ICD-10-CM

## 2015-10-20 DIAGNOSIS — F4321 Adjustment disorder with depressed mood: Secondary | ICD-10-CM

## 2015-10-20 DIAGNOSIS — I10 Essential (primary) hypertension: Secondary | ICD-10-CM

## 2015-10-20 DIAGNOSIS — I5020 Unspecified systolic (congestive) heart failure: Secondary | ICD-10-CM

## 2015-10-20 DIAGNOSIS — I2510 Atherosclerotic heart disease of native coronary artery without angina pectoris: Secondary | ICD-10-CM

## 2015-10-20 NOTE — Telephone Encounter (Signed)
Rec'd fax pt requesting refill on Cephalexin 500 mg take 4 capsules by mouth one hour before dental appointment. Not on med list is this ok...[name]

## 2015-10-21 MED ORDER — SIMVASTATIN 80 MG PO TABS
80.0000 mg | ORAL_TABLET | Freq: Every day | ORAL | 3 refills | Status: DC
Start: 2015-10-21 — End: 2016-08-23

## 2015-10-21 MED ORDER — CLOPIDOGREL BISULFATE 75 MG PO TABS: 75.0000 mg | ORAL_TABLET | Freq: Every day | ORAL | 3 refills | Status: DC

## 2015-10-21 MED ORDER — CEPHALEXIN 500 MG PO CAPS: 2000.0000 mg | ORAL_CAPSULE | Freq: Every day | ORAL | 0 refills | Status: DC

## 2015-10-21 MED ORDER — CARVEDILOL 6.25 MG PO TABS: 6.2500 mg | ORAL_TABLET | Freq: Two times a day (BID) | ORAL | 3 refills | Status: DC

## 2015-10-21 MED ORDER — PANTOPRAZOLE SODIUM 40 MG PO TBEC: 40.0000 mg | DELAYED_RELEASE_TABLET | Freq: Every day | ORAL | 3 refills | Status: DC

## 2015-10-21 NOTE — Telephone Encounter (Signed)
Refills sent to [facility]...[name]

## 2015-10-21 NOTE — Telephone Encounter (Signed)
OK to fill this prescription with additional refills x3 Thank you!  

## 2015-10-23 ENCOUNTER — Encounter: Payer: Self-pay | Admitting: Cardiovascular Disease

## 2015-10-24 ENCOUNTER — Ambulatory Visit (INDEPENDENT_AMBULATORY_CARE_PROVIDER_SITE_OTHER): Payer: Medicare Other | Admitting: Cardiovascular Disease

## 2015-10-24 ENCOUNTER — Encounter: Payer: Self-pay | Admitting: Cardiovascular Disease

## 2015-10-24 VITALS — BP 130/58 | HR 61 | Ht 65.0 in | Wt 163.6 lb

## 2015-10-24 DIAGNOSIS — I2510 Atherosclerotic heart disease of native coronary artery without angina pectoris: Secondary | ICD-10-CM

## 2015-10-24 DIAGNOSIS — I10 Essential (primary) hypertension: Secondary | ICD-10-CM

## 2015-10-24 NOTE — Patient Instructions (Signed)

## 2015-10-24 NOTE — Progress Notes (Signed)
Chief Complaint  Patient presents with  . Carotid Artery Stenosis    History of Present Illness: 82 yo WM with h/o CAD s/p posterior inferior MI 2002 with placement of a bare metal stent in the circumflex artery and subsequent unstable angina August 2007 with placement of drug eluting stent in the mid LAD , also history of HTN, hyperlipidemia who is here today for follow up. Last stress test 10/07/10 without ischemia.  He is here today for follow up. Has had no exertional chest pain or SOB. No palpitations, dizziness, near syncope or syncope. He has been active. He continues to exercise.   Primary Care Physician: Walker Kehr, MD   Past Medical History:  Diagnosis Date  . Coronary atherosclerosis of unspecified type of vessel, native or graft   . Dizziness and giddiness   . Hypertrophy of prostate without urinary obstruction and other lower urinary tract symptoms (LUTS)   . MI (myocardial infarction) ([place])   . Osteoarthrosis, unspecified whether generalized or localized, unspecified site   . Other and unspecified hyperlipidemia   . Shortness of breath dyspnea    with exertion  . Thrombocytopenia, unspecified ([place])   . Unspecified essential hypertension     Past Surgical History:  Procedure Laterality Date  . COLONOSCOPY    . CORONARY ANGIOPLASTY  01/07/2001, 2008   2 stents  . ESOPHAGOGASTRODUODENOSCOPY N/A 04/22/2014   Procedure: ESOPHAGOGASTRODUODENOSCOPY (EGD);  Surgeon: Milus Banister, MD;  Location: [place];  Service: Endoscopy;  Laterality: N/A;  . HIP ARTHROPLASTY Right 2006  . TRANSURETHRAL RESECTION OF PROSTATE      Current Outpatient Prescriptions  Medication Sig Dispense Refill  . aspirin 81 MG EC tablet Take 81 mg by mouth daily.      [name] b complex vitamins tablet Take 1 tablet by mouth daily.      . carvedilol (COREG) 6.25 MG tablet Take 1 tablet (6.25 mg total) by mouth 2 (two) times daily with a meal. 180 tablet 3  . cephALEXin (KEFLEX) 500 MG capsule  Take 4 capsules (2,000 mg total) by mouth daily. Take 4 capsules by mouth one hour before dental appointment 4 capsule 0  . Cholecalciferol 1000 UNITS tablet Take 1,000 Units by mouth daily.      . clopidogrel (PLAVIX) 75 MG tablet Take 1 tablet (75 mg total) by mouth daily. 90 tablet 3  . finasteride (PROSCAR) 5 MG tablet Take 1 tablet (5 mg total) by mouth daily. 90 tablet 3  . Glucosamine-Chondroit-Vit C-Mn (GLUCOSAMINE 1500 COMPLEX PO) Take 1 tablet by mouth 2 (two) times daily.     [name] lisinopril (PRINIVIL,ZESTRIL) 10 MG tablet TAKE 1 TABLET (10 MG TOTAL) BY MOUTH 2 (TWO) TIMES DAILY. 60 tablet 5  . nitroGLYCERIN (NITROSTAT) 0.4 MG SL tablet Place 1 tablet (0.4 mg total) under the tongue every 5 (five) minutes as needed for chest pain. 25 tablet 2  . pantoprazole (PROTONIX) 40 MG tablet Take 1 tablet (40 mg total) by mouth daily. 90 tablet 3  . polyethylene glycol (MIRALAX / GLYCOLAX) packet Take 17 g by mouth daily.    . simvastatin (ZOCOR) 80 MG tablet Take 1 tablet (80 mg total) by mouth at bedtime. 90 tablet 3  . vitamin C (ASCORBIC ACID) 500 MG tablet Take 500 mg by mouth daily.     No current facility-administered medications for this visit.     Allergies  Allergen Reactions  . Oxycodone Nausea And Vomiting  . Penicillins Hives    Social History  Social History  . Marital status: Married    Spouse name: [name]  . Number of children: 2  . Years of education: N/A   Occupational History  . Retired Retired   Social History Main Topics  . Smoking status: Never Smoker  . Smokeless tobacco: Never Used  . Alcohol use No  . Drug use: No  . Sexual activity: Not Currently   Other Topics Concern  . Not on file   Social History Narrative   Regular Exercise -  YES          Family History  Problem Relation Age of Onset  . Heart disease Father   . Coronary artery disease Other   . Colon cancer Neg Hx     Review of Systems:  As stated in the HPI and otherwise negative.     BP (!) 130/58   Pulse 61   Ht 5\' 5"  (1.651 m)   Wt 74.2 kg (163 lb 9.6 oz)   BMI 27.22 kg/m   Physical Examination: General: Well developed, well nourished, NAD  HEENT: OP clear, mucus membranes moist  SKIN: warm, dry. No rashes. Neuro: No focal deficits  Musculoskeletal: Muscle strength 5/5 all ext  Psychiatric: Mood and affect normal  Neck: No JVD, no carotid bruits, no thyromegaly, no lymphadenopathy.  Lungs:Clear bilaterally, no wheezes, rhonci, crackles Cardiovascular: Loletha Grayer, regular rhythm. No murmurs, gallops or rubs. Abdomen:Soft. Bowel sounds present. Non-tender.  Extremities: No lower extremity edema. Pulses are 2 + in the bilateral DP/PT.  EKG:  EKG is ordered today. The ekg ordered today demonstrates NSR, rate 61 bpm. Poor R wave progression.   Recent Labs: 07/28/2015: ALT 18; BUN 15; Creatinine, Ser 1.13; Hemoglobin 14.7; Platelets 220.0; Potassium 4.5; Sodium 139; TSH 3.20   Lipid Panel    Component Value Date/Time   CHOL 149 07/28/2015 1408   TRIG 65.0 07/28/2015 1408   HDL 74.10 07/28/2015 1408   CHOLHDL 2 07/28/2015 1408   VLDL 13.0 07/28/2015 1408   LDLCALC 62 07/28/2015 1408     Wt Readings from Last 3 Encounters:  10/24/15 74.2 kg (163 lb 9.6 oz)  07/28/15 72.6 kg (160 lb)  04/28/15 72.1 kg (159 lb)     Other studies Reviewed: Additional studies/ records that were reviewed today include: . Review of the above records demonstrates:    Assessment and Plan:   1. CORONARY ARTERY DISEASE: He has no chest pain suggestive of angina. Continue ASA, beta blocker, Ace-inh, statin. At his age, will not plan stress testing.    2. HYPERTENSION: BP well controlled. No changes  Current medicines are reviewed at length with the patient today.  The patient does not have concerns regarding medicines.  The following changes have been made:  no change  Labs/ tests ordered today include:   Orders Placed This Encounter  Procedures  . EKG 12-Lead     Disposition:   FU with me in 6 months  Signed, Lauree Chandler, MD 10/24/2015 3:13 PM    [facility] [place], [place],   [address] Phone: [phone]; Fax: [phone]

## 2015-10-28 ENCOUNTER — Encounter: Payer: Self-pay | Admitting: Internal Medicine

## 2015-10-28 DIAGNOSIS — D044 Carcinoma in situ of skin of scalp and neck: Secondary | ICD-10-CM | POA: Diagnosis not present

## 2015-10-28 DIAGNOSIS — D485 Neoplasm of uncertain behavior of skin: Secondary | ICD-10-CM | POA: Diagnosis not present

## 2015-10-28 DIAGNOSIS — Z85828 Personal history of other malignant neoplasm of skin: Secondary | ICD-10-CM | POA: Diagnosis not present

## 2015-11-03 ENCOUNTER — Telehealth: Payer: Self-pay | Admitting: Emergency Medicine

## 2015-11-03 MED ORDER — LISINOPRIL 10 MG PO TABS: 10.0000 mg | ORAL_TABLET | Freq: Two times a day (BID) | ORAL | 5 refills | Status: DC

## 2015-11-03 NOTE — Telephone Encounter (Signed)
Rx printed/signed/faxed to [facility] @ [phone].

## 2015-11-03 NOTE — Telephone Encounter (Signed)
Pt needs prescription lisinopril (PRINIVIL,ZESTRIL) 10 MG tablet send to [facility]. Fax # is [phone]. Please advise thanks.

## 2015-11-04 MED ORDER — LISINOPRIL 10 MG PO TABS
10.0000 mg | ORAL_TABLET | Freq: Two times a day (BID) | ORAL | 5 refills | Status: DC
Start: 2015-11-04 — End: 2015-12-02

## 2015-11-04 NOTE — Addendum Note (Signed)
Addended by: Cresenciano Lick on: 11/04/2015 08:59 AM   Modules accepted: Orders

## 2015-11-04 NOTE — Telephone Encounter (Signed)
Pharmacy called back and said they never received this prescription. They asked that you give her a call back. Thanks.

## 2015-11-04 NOTE — Telephone Encounter (Signed)
Called refill into [facility] spoke w/[name] gave authorization verbally. She stated not sure what's going on with her fax machine, but they are getting a new one so hopefully in future can jus fax...[name]

## 2015-11-25 ENCOUNTER — Ambulatory Visit (INDEPENDENT_AMBULATORY_CARE_PROVIDER_SITE_OTHER): Payer: Medicare Other | Admitting: Internal Medicine

## 2015-11-25 ENCOUNTER — Other Ambulatory Visit: Payer: Medicare Other

## 2015-11-25 ENCOUNTER — Other Ambulatory Visit (INDEPENDENT_AMBULATORY_CARE_PROVIDER_SITE_OTHER): Payer: Medicare Other

## 2015-11-25 ENCOUNTER — Encounter: Payer: Self-pay | Admitting: Internal Medicine

## 2015-11-25 VITALS — BP 140/62 | HR 53 | Wt 160.0 lb

## 2015-11-25 DIAGNOSIS — H9193 Unspecified hearing loss, bilateral: Secondary | ICD-10-CM

## 2015-11-25 DIAGNOSIS — I2510 Atherosclerotic heart disease of native coronary artery without angina pectoris: Secondary | ICD-10-CM

## 2015-11-25 DIAGNOSIS — I10 Essential (primary) hypertension: Secondary | ICD-10-CM | POA: Diagnosis not present

## 2015-11-25 DIAGNOSIS — R972 Elevated prostate specific antigen [PSA]: Secondary | ICD-10-CM | POA: Diagnosis not present

## 2015-11-25 DIAGNOSIS — H9190 Unspecified hearing loss, unspecified ear: Secondary | ICD-10-CM | POA: Insufficient documentation

## 2015-11-25 DIAGNOSIS — I5020 Unspecified systolic (congestive) heart failure: Secondary | ICD-10-CM

## 2015-11-25 LAB — BASIC METABOLIC PANEL
BUN: 19 mg/dL (ref 6–23)
CO2: 29 mEq/L (ref 19–32)
Calcium: 9.6 mg/dL (ref 8.4–10.5)
Chloride: 105 mEq/L (ref 96–112)
Creatinine, Ser: 1.25 mg/dL (ref 0.40–1.50)
GFR: 57.64 mL/min — ABNORMAL LOW (ref >60.00)
Glucose, Bld: 114 mg/dL — ABNORMAL HIGH (ref 70–99)
Potassium: 4.5 mEq/L (ref 3.5–5.1)
Sodium: 141 mEq/L (ref 135–145)

## 2015-11-25 NOTE — Assessment & Plan Note (Signed)
L>>R Audiology ref

## 2015-11-25 NOTE — Progress Notes (Signed)
Pre visit review using our clinic review tool, if applicable. No additional management support is needed unless otherwise documented below in the visit note. 

## 2015-11-25 NOTE — Assessment & Plan Note (Signed)
Monitoring PSA 

## 2015-11-25 NOTE — Assessment & Plan Note (Signed)
Lisinopril, Coreg 

## 2015-11-25 NOTE — Addendum Note (Signed)
Addended by: Cassandria Anger on: 11/25/2015 01:54 PM   Modules accepted: Orders

## 2015-11-25 NOTE — Progress Notes (Signed)
Subjective:  Patient ID: [name], male    DOB: 05-27-1923  Age: 82 y.o. MRN: [id number]  CC: No chief complaint on file.   HPI ALFONZO OVERMAN presents for CAD, HTN, dyslipidemia. C/o hearing loss on the L.  Outpatient Medications Prior to Visit  Medication Sig Dispense Refill  . aspirin 81 MG EC tablet Take 81 mg by mouth daily.      [name] b complex vitamins tablet Take 1 tablet by mouth daily.      . carvedilol (COREG) 6.25 MG tablet Take 1 tablet (6.25 mg total) by mouth 2 (two) times daily with a meal. 180 tablet 3  . cephALEXin (KEFLEX) 500 MG capsule Take 4 capsules (2,000 mg total) by mouth daily. Take 4 capsules by mouth one hour before dental appointment 4 capsule 0  . Cholecalciferol 1000 UNITS tablet Take 1,000 Units by mouth daily.      . clopidogrel (PLAVIX) 75 MG tablet Take 1 tablet (75 mg total) by mouth daily. 90 tablet 3  . finasteride (PROSCAR) 5 MG tablet Take 1 tablet (5 mg total) by mouth daily. 90 tablet 3  . Glucosamine-Chondroit-Vit C-Mn (GLUCOSAMINE 1500 COMPLEX PO) Take 1 tablet by mouth 2 (two) times daily.     [name] lisinopril (PRINIVIL,ZESTRIL) 10 MG tablet Take 1 tablet (10 mg total) by mouth 2 (two) times daily. 60 tablet 5  . nitroGLYCERIN (NITROSTAT) 0.4 MG SL tablet Place 1 tablet (0.4 mg total) under the tongue every 5 (five) minutes as needed for chest pain. 25 tablet 2  . pantoprazole (PROTONIX) 40 MG tablet Take 1 tablet (40 mg total) by mouth daily. 90 tablet 3  . polyethylene glycol (MIRALAX / GLYCOLAX) packet Take 17 g by mouth daily.    . simvastatin (ZOCOR) 80 MG tablet Take 1 tablet (80 mg total) by mouth at bedtime. 90 tablet 3  . vitamin C (ASCORBIC ACID) 500 MG tablet Take 500 mg by mouth daily.     No facility-administered medications prior to visit.     ROS Review of Systems  Constitutional: Negative for appetite change, fatigue and unexpected weight change.  HENT: Positive for hearing loss. Negative for congestion, nosebleeds,  sneezing, sore throat and trouble swallowing.   Eyes: Negative for itching and visual disturbance.  Respiratory: Negative for cough.   Cardiovascular: Negative for chest pain, palpitations and leg swelling.  Gastrointestinal: Negative for abdominal distention, blood in stool, diarrhea and nausea.  Genitourinary: Negative for frequency and hematuria.  Musculoskeletal: Negative for back pain, gait problem, joint swelling and neck pain.  Skin: Negative for rash.  Neurological: Negative for dizziness, tremors, speech difficulty and weakness.  Psychiatric/Behavioral: Negative for agitation, dysphoric mood and sleep disturbance. The patient is not nervous/anxious.     Objective:  BP 140/62   Pulse (!) 53   Wt 160 lb (72.6 kg)   SpO2 98%   BMI 26.63 kg/m   BP Readings from Last 3 Encounters:  11/25/15 140/62  10/24/15 (!) 130/58  07/28/15 120/70    Wt Readings from Last 3 Encounters:  11/25/15 160 lb (72.6 kg)  10/24/15 163 lb 9.6 oz (74.2 kg)  07/28/15 160 lb (72.6 kg)    Physical Exam  Constitutional: He is oriented to person, place, and time. He appears well-developed. No distress.  NAD  HENT:  Mouth/Throat: Oropharynx is clear and moist.  Eyes: Conjunctivae are normal. Pupils are equal, round, and reactive to light.  Neck: Normal range of motion. No JVD present. No  thyromegaly present.  Cardiovascular: Normal rate, regular rhythm, normal heart sounds and intact distal pulses.  Exam reveals no gallop and no friction rub.   No murmur heard. Pulmonary/Chest: Effort normal and breath sounds normal. No respiratory distress. He has no wheezes. He has no rales. He exhibits no tenderness.  Abdominal: Soft. Bowel sounds are normal. He exhibits no distension and no mass. There is no tenderness. There is no rebound and no guarding.  Musculoskeletal: Normal range of motion. He exhibits no edema or tenderness.  Lymphadenopathy:    He has no cervical adenopathy.  Neurological: He is  alert and oriented to person, place, and time. He has normal reflexes. No cranial nerve deficit. He exhibits normal muscle tone. He displays a negative Romberg sign. Coordination and gait normal.  Skin: Skin is warm and dry. No rash noted.  Psychiatric: He has a normal mood and affect. His behavior is normal. Judgment and thought content normal.  B ears WNL  Lab Results  Component Value Date   WBC 8.4 07/28/2015   HGB 14.7 07/28/2015   HCT 44.6 07/28/2015   PLT 220.0 07/28/2015   GLUCOSE 122 (H) 07/28/2015   CHOL 149 07/28/2015   TRIG 65.0 07/28/2015   HDL 74.10 07/28/2015   LDLCALC 62 07/28/2015   ALT 18 07/28/2015   AST 24 07/28/2015   NA 139 07/28/2015   K 4.5 07/28/2015   CL 104 07/28/2015   CREATININE 1.13 07/28/2015   BUN 15 07/28/2015   CO2 30 07/28/2015   TSH 3.20 07/28/2015   PSA 6.45 (H) 08/07/2013   HGBA1C 6.4 12/05/2012    No results found.  Assessment & Plan:   Diagnoses and all orders for this visit:  Systolic congestive heart failure, unspecified congestive heart failure chronicity ([facility])  Atherosclerosis of native coronary artery of native heart without angina pectoris  Essential hypertension -     Basic metabolic panel; Future  PSA, INCREASED   I am having Mr. Yeates maintain his aspirin, b complex vitamins, Glucosamine-Chondroit-Vit C-Mn (GLUCOSAMINE 1500 COMPLEX PO), Cholecalciferol, polyethylene glycol, vitamin C, finasteride, nitroGLYCERIN, cephALEXin, pantoprazole, clopidogrel, carvedilol, simvastatin, and lisinopril.  No orders of the defined types were placed in this encounter.    Follow-up: Return in about 4 months (around 03/27/2016) for a follow-up visit.  Walker Kehr, MD

## 2015-11-25 NOTE — Assessment & Plan Note (Signed)
On Plavix, ASA, Coreg, Simvastatin 

## 2015-12-02 ENCOUNTER — Other Ambulatory Visit: Payer: Self-pay | Admitting: *Deleted

## 2015-12-02 MED ORDER — LISINOPRIL 10 MG PO TABS: 10.0000 mg | ORAL_TABLET | Freq: Two times a day (BID) | ORAL | 5 refills | Status: DC

## 2015-12-02 NOTE — Telephone Encounter (Signed)
Left msg on triage stating pt is needing refills on his Lisinopril. Faxed script to [facility]...Johny Chess

## 2015-12-22 DIAGNOSIS — H90A32 Mixed conductive and sensorineural hearing loss, unilateral, left ear with restricted hearing on the contralateral side: Secondary | ICD-10-CM | POA: Diagnosis not present

## 2016-01-16 ENCOUNTER — Ambulatory Visit: Payer: Medicare Other | Admitting: Cardiovascular Disease

## 2016-02-09 ENCOUNTER — Other Ambulatory Visit: Payer: Self-pay | Admitting: *Deleted

## 2016-02-09 DIAGNOSIS — I10 Essential (primary) hypertension: Secondary | ICD-10-CM

## 2016-02-09 DIAGNOSIS — I5020 Unspecified systolic (congestive) heart failure: Secondary | ICD-10-CM

## 2016-02-09 MED ORDER — CLOPIDOGREL BISULFATE 75 MG PO TABS: 75.0000 mg | ORAL_TABLET | Freq: Every day | ORAL | 1 refills | Status: DC

## 2016-02-09 NOTE — Telephone Encounter (Signed)
Requesting refill to be sent to [facility] on pt Plavix. Sent electronically...Johny Chess

## 2016-02-26 ENCOUNTER — Telehealth: Payer: Self-pay | Admitting: *Deleted

## 2016-02-26 ENCOUNTER — Telehealth: Payer: Self-pay | Admitting: Internal Medicine

## 2016-02-26 NOTE — Telephone Encounter (Signed)
[facility] Patient Name: [name] DOB: 03/05/1923 Initial Comment Caller states husband fell and is sore all over, she needs to know if he can take Advil for soreness with his medication. She would like to speak with Dr. Alain Marion or his nurse. Nurse Assessment Nurse: Jimmey Ralph, RN, Lissa Date/Time Eilene Ghazi Time): 02/26/2016 3:15:24 PM Confirm and document reason for call. If symptomatic, describe symptoms. ---Caller states husband fell over two weeks ago. He fell backward and hit hip and back. There are two dark looking bruises there. He is sore all over, she needs to know if he can take Advil for soreness with his medication. She would like to speak with Dr. Alain Marion or his nurse. Does the patient have any new or worsening symptoms? ---Yes Will a triage be completed? ---Yes Related visit to physician within the last 2 weeks? ---No Does the PT have any chronic conditions? (i.e. diabetes, asthma, etc.) ---Yes List chronic conditions. ---Heart condiditonIs this a behavioral health or substance abuse call? ---No Guidelines Guideline Title Affirmed Question Affirmed Notes Back Injury [1] High-risk adult (e.g., age > 38, osteoporosis, chronic steroid use) AND [2] still hurts Final Disposition User See Physician within 24 Hours Hammonds, RN, Lissa Comments We have home health services. Caller wants to get xray for Manpreet. made appointment for 2:45 pm Friday for pt. with Pricilla Holm, MD Gave wife this information Referrals REFERRED TO PCP OFFICE Disagree/Comply: Comply Call Id: [phone]

## 2016-02-26 NOTE — Telephone Encounter (Signed)
Pt's wife called- she states pt fell while in the shower and landed on his back a little over 2 weeks ago. He was not evaluated because at first he had no pain or symptoms. Last Thursday he started experencing t-spine back pain.   She states the only way to get him here for a OV is by EMS. She wants to know what he can take OTC for pain or what you advise.   Wife is aware PCP is out of the office until 02/27/16.

## 2016-02-27 ENCOUNTER — Ambulatory Visit: Payer: Self-pay | Admitting: Internal Medicine

## 2016-02-27 NOTE — Telephone Encounter (Signed)
Try 400 mg Ibuprofen twice a day and\or Tylenol 650 mg 2-3 times a day. Go to ER if worse or not better Thx

## 2016-02-27 NOTE — Telephone Encounter (Signed)
Pts wife informed.

## 2016-03-10 DIAGNOSIS — J343 Hypertrophy of nasal turbinates: Secondary | ICD-10-CM | POA: Diagnosis not present

## 2016-03-10 DIAGNOSIS — R079 Chest pain, unspecified: Secondary | ICD-10-CM | POA: Diagnosis not present

## 2016-03-10 DIAGNOSIS — H9072 Mixed conductive and sensorineural hearing loss, unilateral, left ear, with unrestricted hearing on the contralateral side: Secondary | ICD-10-CM | POA: Diagnosis not present

## 2016-03-10 DIAGNOSIS — J310 Chronic rhinitis: Secondary | ICD-10-CM | POA: Diagnosis not present

## 2016-03-30 ENCOUNTER — Ambulatory Visit: Payer: Medicare Other | Admitting: Internal Medicine

## 2016-04-07 DIAGNOSIS — H6522 Chronic serous otitis media, left ear: Secondary | ICD-10-CM | POA: Diagnosis not present

## 2016-04-07 DIAGNOSIS — H9012 Conductive hearing loss, unilateral, left ear, with unrestricted hearing on the contralateral side: Secondary | ICD-10-CM | POA: Diagnosis not present

## 2016-04-26 ENCOUNTER — Encounter: Payer: Self-pay | Admitting: Cardiovascular Disease

## 2016-04-26 ENCOUNTER — Ambulatory Visit (INDEPENDENT_AMBULATORY_CARE_PROVIDER_SITE_OTHER): Payer: Medicare Other | Admitting: Cardiovascular Disease

## 2016-04-26 VITALS — BP 142/78 | HR 50 | Ht 65.0 in | Wt 160.0 lb

## 2016-04-26 DIAGNOSIS — I2510 Atherosclerotic heart disease of native coronary artery without angina pectoris: Secondary | ICD-10-CM | POA: Diagnosis not present

## 2016-04-26 DIAGNOSIS — I10 Essential (primary) hypertension: Secondary | ICD-10-CM | POA: Diagnosis not present

## 2016-04-26 NOTE — Patient Instructions (Signed)

## 2016-04-26 NOTE — Progress Notes (Signed)
Chief Complaint  Patient presents with  . Follow-up    History of Present Illness: [name] with h/o CAD s/p posterior inferior MI 2002 with placement of a bare metal stent in the circumflex artery and subsequent unstable angina August 2007 with placement of drug eluting stent in the mid LAD , also history of HTN, hyperlipidemia who is here today for follow up. Last stress test 10/07/10 without ischemia.  He is here today for follow up. Has had no exertional chest pain or SOB. No palpitations, dizziness, near syncope or syncope. He has been active. He continues to exercise.   Primary Care Physician: Ricky Kehr, MD   Past Medical History:  Diagnosis Date  . Coronary atherosclerosis of unspecified type of vessel, native or graft   . Dizziness and giddiness   . Hypertrophy of prostate without urinary obstruction and other lower urinary tract symptoms (LUTS)   . MI (myocardial infarction)   . Osteoarthrosis, unspecified whether generalized or localized, unspecified site   . Other and unspecified hyperlipidemia   . Shortness of breath dyspnea    with exertion  . Thrombocytopenia, unspecified   . Unspecified essential hypertension     Past Surgical History:  Procedure Laterality Date  . COLONOSCOPY    . CORONARY ANGIOPLASTY  01/07/2001, 2008   2 stents  . ESOPHAGOGASTRODUODENOSCOPY N/A 04/22/2014   Procedure: ESOPHAGOGASTRODUODENOSCOPY (EGD);  Surgeon: Milus Banister, MD;  Location: [facility];  Service: Endoscopy;  Laterality: N/A;  . HIP ARTHROPLASTY Right 2006  . TRANSURETHRAL RESECTION OF PROSTATE      Current Outpatient Prescriptions  Medication Sig Dispense Refill  . aspirin 81 MG EC tablet Take 81 mg by mouth daily.      [name] b complex vitamins tablet Take 1 tablet by mouth daily.      . carvedilol (COREG) 6.25 MG tablet Take 1 tablet (6.25 mg total) by mouth 2 (two) times daily with a meal. 180 tablet 3  . cephALEXin (KEFLEX) 500 MG capsule Take 4 capsules (2,000 mg  total) by mouth daily. Take 4 capsules by mouth one hour before dental appointment 4 capsule 0  . Cholecalciferol 1000 UNITS tablet Take 1,000 Units by mouth daily.      . clopidogrel (PLAVIX) 75 MG tablet Take 1 tablet (75 mg total) by mouth daily. Yearly physical die in June must see MD for future refills 90 tablet 1  . finasteride (PROSCAR) 5 MG tablet Take 1 tablet (5 mg total) by mouth daily. 90 tablet 3  . Glucosamine-Chondroit-Vit C-Mn (GLUCOSAMINE 1500 COMPLEX PO) Take 1 tablet by mouth 2 (two) times daily.     [name] lisinopril (PRINIVIL,ZESTRIL) 10 MG tablet Take 1 tablet (10 mg total) by mouth 2 (two) times daily. 60 tablet 5  . nitroGLYCERIN (NITROSTAT) 0.4 MG SL tablet Place 1 tablet (0.4 mg total) under the tongue every 5 (five) minutes as needed for chest pain. 25 tablet 2  . pantoprazole (PROTONIX) 40 MG tablet Take 1 tablet (40 mg total) by mouth daily. 90 tablet 3  . polyethylene glycol (MIRALAX / GLYCOLAX) packet Take 17 g by mouth daily.    . simvastatin (ZOCOR) 80 MG tablet Take 1 tablet (80 mg total) by mouth at bedtime. 90 tablet 3  . vitamin C (ASCORBIC ACID) 500 MG tablet Take 500 mg by mouth daily.     No current facility-administered medications for this visit.     Allergies  Allergen Reactions  . Oxycodone Nausea And Vomiting  .  Penicillins Hives    Social History   Social History  . Marital status: Married    Spouse name: [name]  . Number of children: 2  . Years of education: N/A   Occupational History  . Retired Retired   Social History Main Topics  . Smoking status: Never Smoker  . Smokeless tobacco: Never Used  . Alcohol use No  . Drug use: No  . Sexual activity: Not Currently   Other Topics Concern  . Not on file   Social History Narrative   Regular Exercise -  YES          Family History  Problem Relation Age of Onset  . Heart disease Father   . Coronary artery disease Other   . Colon cancer Neg Hx     Review of Systems:  As stated in  the HPI and otherwise negative.   BP (!) 142/78   Pulse (!) 50   Ht 5\' 5"  (1.651 m)   Wt 160 lb (72.6 kg)   SpO2 97%   BMI 26.63 kg/m   Physical Examination: General: Well developed, well nourished, NAD  HEENT: OP clear, mucus membranes moist  SKIN: warm, dry. No rashes. Neuro: No focal deficits  Musculoskeletal: Muscle strength 5/5 all ext  Psychiatric: Mood and affect normal  Neck: No JVD, no carotid bruits, no thyromegaly, no lymphadenopathy.  Lungs:Clear bilaterally, no wheezes, rhonci, crackles Cardiovascular: [name], regular rhythm. No murmurs, gallops or rubs. Abdomen:Soft. Bowel sounds present. Non-tender.  Extremities: No lower extremity edema. Pulses are 2 + in the bilateral DP/PT.  EKG:  EKG is not ordered today. The ekg ordered today demonstrates   Recent Labs: 07/28/2015: ALT 18; Hemoglobin 14.7; Platelets 220.0; TSH 3.20 11/25/2015: BUN 19; Creatinine, Ser 1.25; Potassium 4.5; Sodium 141   Lipid Panel    Component Value Date/Time   CHOL 149 07/28/2015 1408   TRIG 65.0 07/28/2015 1408   HDL 74.10 07/28/2015 1408   CHOLHDL 2 07/28/2015 1408   VLDL 13.0 07/28/2015 1408   LDLCALC 62 07/28/2015 1408     Wt Readings from Last 3 Encounters:  04/26/16 160 lb (72.6 kg)  11/25/15 160 lb (72.6 kg)  10/24/15 163 lb 9.6 oz (74.2 kg)     Other studies Reviewed: Additional studies/ records that were reviewed today include: . Review of the above records demonstrates:    Assessment and Plan:   1. CORONARY ARTERY DISEASE without angina: He has no chest pain suggestive of angina. Continue ASA, beta blocker, Ace-inh, statin.    2. HYPERTENSION: BP well controlled. No changes  Current medicines are reviewed at length with the patient today.  The patient does not have concerns regarding medicines.  The following changes have been made:  no change  Labs/ tests ordered today include:   No orders of the defined types were placed in this encounter.   Disposition:    FU with me in 6 months  Signed, Ricky Chandler, MD 04/26/2016 1:40 PM    [facility] [place], [place], [place]  [address] Phone: [phone]; Fax: [phone]

## 2016-04-27 ENCOUNTER — Other Ambulatory Visit: Payer: Self-pay | Admitting: *Deleted

## 2016-04-27 MED ORDER — FINASTERIDE 5 MG PO TABS: 5.0000 mg | ORAL_TABLET | Freq: Every day | ORAL | 1 refills | Status: DC

## 2016-04-27 NOTE — Telephone Encounter (Signed)
Left msg on triage stating pt is needing refill on finasteride 5 mg sent to [facility]. Sent rx electronically...Johny Chess

## 2016-05-03 ENCOUNTER — Other Ambulatory Visit (INDEPENDENT_AMBULATORY_CARE_PROVIDER_SITE_OTHER): Payer: Medicare Other

## 2016-05-03 ENCOUNTER — Encounter: Payer: Self-pay | Admitting: Internal Medicine

## 2016-05-03 ENCOUNTER — Ambulatory Visit (INDEPENDENT_AMBULATORY_CARE_PROVIDER_SITE_OTHER): Payer: Medicare Other | Admitting: Internal Medicine

## 2016-05-03 DIAGNOSIS — I10 Essential (primary) hypertension: Secondary | ICD-10-CM | POA: Diagnosis not present

## 2016-05-03 DIAGNOSIS — I5020 Unspecified systolic (congestive) heart failure: Secondary | ICD-10-CM | POA: Diagnosis not present

## 2016-05-03 DIAGNOSIS — E785 Hyperlipidemia, unspecified: Secondary | ICD-10-CM | POA: Diagnosis not present

## 2016-05-03 DIAGNOSIS — F4321 Adjustment disorder with depressed mood: Secondary | ICD-10-CM | POA: Diagnosis not present

## 2016-05-03 DIAGNOSIS — I2510 Atherosclerotic heart disease of native coronary artery without angina pectoris: Secondary | ICD-10-CM

## 2016-05-03 LAB — CBC WITH DIFFERENTIAL/PLATELET
Basophils Absolute: 0 K/uL (ref 0.0–0.1)
Basophils Relative: 0.6 % (ref 0.0–3.0)
Eosinophils Absolute: 0.2 K/uL (ref 0.0–0.7)
Eosinophils Relative: 2.3 % (ref 0.0–5.0)
HCT: 41.3 % (ref 39.0–52.0)
Hemoglobin: 13.9 g/dL (ref 13.0–17.0)
Lymphocytes Relative: 25 % (ref 12.0–46.0)
Lymphs Abs: 1.8 K/uL (ref 0.7–4.0)
MCHC: 33.6 g/dL (ref 30.0–36.0)
MCV: 93.6 fl (ref 78.0–100.0)
Monocytes Absolute: 0.9 K/uL (ref 0.1–1.0)
Monocytes Relative: 12.3 % — ABNORMAL HIGH (ref 3.0–12.0)
Neutro Abs: 4.2 K/uL (ref 1.4–7.7)
Neutrophils Relative %: 59.8 % (ref 43.0–77.0)
Platelets: 190 K/uL (ref 150.0–400.0)
RBC: 4.41 Mil/uL (ref 4.22–5.81)
RDW: 14.8 % (ref 11.5–15.5)
WBC: 7.1 K/uL (ref 4.0–10.5)

## 2016-05-03 LAB — BASIC METABOLIC PANEL WITH GFR
BUN: 17 mg/dL (ref 6–23)
CO2: 28 meq/L (ref 19–32)
Calcium: 9.1 mg/dL (ref 8.4–10.5)
Chloride: 105 meq/L (ref 96–112)
Creatinine, Ser: 1.04 mg/dL (ref 0.40–1.50)
GFR: 71.2 mL/min
Glucose, Bld: 145 mg/dL — ABNORMAL HIGH (ref 70–99)
Potassium: 3.9 meq/L (ref 3.5–5.1)
Sodium: 140 meq/L (ref 135–145)

## 2016-05-03 LAB — HEPATIC FUNCTION PANEL
ALT: 14 U/L (ref 0–53)
AST: 22 U/L (ref 0–37)
Albumin: 3.8 g/dL (ref 3.5–5.2)
Alkaline Phosphatase: 42 U/L (ref 39–117)
Bilirubin, Direct: 0.2 mg/dL (ref 0.0–0.3)
Total Bilirubin: 1.1 mg/dL (ref 0.2–1.2)
Total Protein: 6.7 g/dL (ref 6.0–8.3)

## 2016-05-03 LAB — TSH: TSH: 3.02 u[IU]/mL (ref 0.35–4.50)

## 2016-05-03 MED ORDER — ERYTHROMYCIN 5 MG/GM OP OINT: 1.0000 "application " | TOPICAL_OINTMENT | Freq: Every day | OPHTHALMIC | 0 refills | Status: DC

## 2016-05-03 MED ORDER — CLOPIDOGREL BISULFATE 75 MG PO TABS: 75.0000 mg | ORAL_TABLET | Freq: Every day | ORAL | 2 refills | Status: DC

## 2016-05-03 NOTE — Assessment & Plan Note (Signed)
On Simvastatin 

## 2016-05-03 NOTE — Assessment & Plan Note (Signed)
Doing better.   

## 2016-05-03 NOTE — Progress Notes (Signed)
Subjective:  Patient ID: [name], male    DOB: [birth year]  Age: 83 y.o. MRN: [id number]  CC: Follow-up (4 month follow up)   HPI [name] presents for CAD, HTN, GERD f/u  Outpatient Medications Prior to Visit  Medication Sig Dispense Refill  . aspirin 81 MG EC tablet Take 81 mg by mouth daily.      [name] b complex vitamins tablet Take 1 tablet by mouth daily.      . carvedilol (COREG) 6.25 MG tablet Take 1 tablet (6.25 mg total) by mouth 2 (two) times daily with a meal. 180 tablet 3  . cephALEXin (KEFLEX) 500 MG capsule Take 4 capsules (2,000 mg total) by mouth daily. Take 4 capsules by mouth one hour before dental appointment 4 capsule 0  . Cholecalciferol 1000 UNITS tablet Take 1,000 Units by mouth daily.      . finasteride (PROSCAR) 5 MG tablet Take 1 tablet (5 mg total) by mouth daily. 90 tablet 1  . Glucosamine-Chondroit-Vit C-Mn (GLUCOSAMINE 1500 COMPLEX PO) Take 1 tablet by mouth 2 (two) times daily.     [name] lisinopril (PRINIVIL,ZESTRIL) 10 MG tablet Take 1 tablet (10 mg total) by mouth 2 (two) times daily. 60 tablet 5  . nitroGLYCERIN (NITROSTAT) 0.4 MG SL tablet Place 1 tablet (0.4 mg total) under the tongue every 5 (five) minutes as needed for chest pain. 25 tablet 2  . pantoprazole (PROTONIX) 40 MG tablet Take 1 tablet (40 mg total) by mouth daily. 90 tablet 3  . polyethylene glycol (MIRALAX / GLYCOLAX) packet Take 17 g by mouth daily.    . simvastatin (ZOCOR) 80 MG tablet Take 1 tablet (80 mg total) by mouth at bedtime. 90 tablet 3  . vitamin C (ASCORBIC ACID) 500 MG tablet Take 500 mg by mouth daily.    . clopidogrel (PLAVIX) 75 MG tablet Take 1 tablet (75 mg total) by mouth daily. Yearly physical die in June must see MD for future refills 90 tablet 1   No facility-administered medications prior to visit.     ROS Review of Systems  Constitutional: Negative for appetite change, fatigue and unexpected weight change.  HENT: Negative for congestion, nosebleeds,  sneezing, sore throat and trouble swallowing.   Eyes: Negative for itching and visual disturbance.  Respiratory: Negative for cough.   Cardiovascular: Negative for chest pain, palpitations and leg swelling.  Gastrointestinal: Negative for abdominal distention, blood in stool, diarrhea and nausea.  Genitourinary: Negative for frequency and hematuria.  Musculoskeletal: Negative for back pain, gait problem, joint swelling and neck pain.  Skin: Negative for rash.  Neurological: Negative for dizziness, tremors, speech difficulty and weakness.  Psychiatric/Behavioral: Negative for agitation, dysphoric mood and sleep disturbance. The patient is not nervous/anxious.     Objective:  BP 138/76   Pulse (!) 54   Temp 98.6 F (37 C)   Ht 5\' 5"  (1.651 m)   Wt 161 lb (73 kg)   SpO2 98%   BMI 26.79 kg/m   BP Readings from Last 3 Encounters:  05/03/16 138/76  04/26/16 (!) 142/78  11/25/15 140/62    Wt Readings from Last 3 Encounters:  05/03/16 161 lb (73 kg)  04/26/16 160 lb (72.6 kg)  11/25/15 160 lb (72.6 kg)    Physical Exam  Constitutional: He is oriented to person, place, and time. He appears well-developed. No distress.  NAD  HENT:  Mouth/Throat: Oropharynx is clear and moist.  Eyes: Conjunctivae are normal. Pupils are equal, round,  and reactive to light.  Neck: Normal range of motion. No JVD present. No thyromegaly present.  Cardiovascular: Normal rate, regular rhythm, normal heart sounds and intact distal pulses.  Exam reveals no gallop and no friction rub.   No murmur heard. Pulmonary/Chest: Effort normal and breath sounds normal. No respiratory distress. He has no wheezes. He has no rales. He exhibits no tenderness.  Abdominal: Soft. Bowel sounds are normal. He exhibits no distension and no mass. There is no tenderness. There is no rebound and no guarding.  Musculoskeletal: Normal range of motion. He exhibits no edema or tenderness.  Lymphadenopathy:    He has no cervical  adenopathy.  Neurological: He is alert and oriented to person, place, and time. He has normal reflexes. No cranial nerve deficit. He exhibits normal muscle tone. He displays a negative Romberg sign. Coordination and gait normal.  Skin: Skin is warm and dry. No rash noted.  Psychiatric: He has a normal mood and affect. His behavior is normal. Judgment and thought content normal.    Lab Results  Component Value Date   WBC 8.4 07/28/2015   HGB 14.7 07/28/2015   HCT 44.6 07/28/2015   PLT 220.0 07/28/2015   GLUCOSE 114 (H) 11/25/2015   CHOL 149 07/28/2015   TRIG 65.0 07/28/2015   HDL 74.10 07/28/2015   LDLCALC 62 07/28/2015   ALT 18 07/28/2015   AST 24 07/28/2015   NA 141 11/25/2015   K 4.5 11/25/2015   CL 105 11/25/2015   CREATININE 1.25 11/25/2015   BUN 19 11/25/2015   CO2 29 11/25/2015   TSH 3.20 07/28/2015   PSA 6.45 (H) 08/07/2013   HGBA1C 6.4 12/05/2012    No results found.  Assessment & Plan:   Holly was seen today for follow-up.  Diagnoses and all orders for this visit:  Systolic congestive heart failure, unspecified congestive heart failure chronicity ([facility]) -     clopidogrel (PLAVIX) 75 MG tablet; Take 1 tablet (75 mg total) by mouth daily.  Essential hypertension -     clopidogrel (PLAVIX) 75 MG tablet; Take 1 tablet (75 mg total) by mouth daily.   I have changed Mr. Hornback clopidogrel. I am also having him maintain his aspirin, b complex vitamins, Glucosamine-Chondroit-Vit C-Mn (GLUCOSAMINE 1500 COMPLEX PO), Cholecalciferol, polyethylene glycol, vitamin C, nitroGLYCERIN, cephALEXin, pantoprazole, carvedilol, simvastatin, lisinopril, and finasteride.  Meds ordered this encounter  Medications  . clopidogrel (PLAVIX) 75 MG tablet    Sig: Take 1 tablet (75 mg total) by mouth daily.    Dispense:  90 tablet    Refill:  2     Follow-up: Return in about 6 months (around 11/02/2016) for a follow-up visit.  Walker Kehr, MD

## 2016-05-20 ENCOUNTER — Other Ambulatory Visit: Payer: Self-pay | Admitting: *Deleted

## 2016-05-20 MED ORDER — LISINOPRIL 10 MG PO TABS: 10.0000 mg | ORAL_TABLET | Freq: Two times a day (BID) | ORAL | 5 refills | Status: DC

## 2016-07-12 ENCOUNTER — Telehealth: Payer: Self-pay | Admitting: Internal Medicine

## 2016-07-12 NOTE — Telephone Encounter (Signed)
Pharmacy changed

## 2016-07-12 NOTE — Telephone Encounter (Signed)
All future prescriptions needs to be sent to [facility] mail order

## 2016-08-23 ENCOUNTER — Telehealth: Payer: Self-pay | Admitting: Internal Medicine

## 2016-08-23 DIAGNOSIS — I10 Essential (primary) hypertension: Secondary | ICD-10-CM

## 2016-08-23 DIAGNOSIS — I2510 Atherosclerotic heart disease of native coronary artery without angina pectoris: Secondary | ICD-10-CM

## 2016-08-23 DIAGNOSIS — E785 Hyperlipidemia, unspecified: Secondary | ICD-10-CM

## 2016-08-23 DIAGNOSIS — F4321 Adjustment disorder with depressed mood: Secondary | ICD-10-CM

## 2016-08-23 MED ORDER — SIMVASTATIN 80 MG PO TABS: 80.0000 mg | ORAL_TABLET | Freq: Every day | ORAL | 0 refills | Status: DC

## 2016-08-23 NOTE — Telephone Encounter (Signed)
Pt is needing a refill on his simvastatin (ZOCOR) 80 MG tablet sent to [facility].

## 2016-08-23 NOTE — Telephone Encounter (Signed)
Verified chart pt is up-to-date sent 90 day script to pharmacy...[name]

## 2016-09-06 DIAGNOSIS — L720 Epidermal cyst: Secondary | ICD-10-CM | POA: Diagnosis not present

## 2016-09-06 DIAGNOSIS — Z85828 Personal history of other malignant neoplasm of skin: Secondary | ICD-10-CM | POA: Diagnosis not present

## 2016-10-07 ENCOUNTER — Telehealth: Payer: Self-pay | Admitting: Internal Medicine

## 2016-10-07 NOTE — Telephone Encounter (Signed)
Pt called in and would like nurse to call her.  She stated that pt is very dizzy and she needs to know what she can give him?

## 2016-10-08 NOTE — Telephone Encounter (Signed)
Called pt wife she stated husband had been having some dizziness feeling since Tues, so on yesterday her daughter bought him some Dramamine (otc) and gave it to him in which he did rest well last night, and not c/o of dizziness this morning. Wife is wanting to know if the dramamine is ok to take, or MD want to rx something else...[name]

## 2016-10-10 NOTE — Telephone Encounter (Signed)
Drammamine is OK Thx

## 2016-10-11 NOTE — Telephone Encounter (Signed)
Notified pt wife w/MD response. Wife states he woke up this am c/o dizziness again so she have made appt for tomorrow to be evaluated...[name]

## 2016-10-12 ENCOUNTER — Ambulatory Visit (INDEPENDENT_AMBULATORY_CARE_PROVIDER_SITE_OTHER): Payer: Medicare Other | Admitting: Internal Medicine

## 2016-10-12 DIAGNOSIS — R42 Dizziness and giddiness: Secondary | ICD-10-CM

## 2016-10-12 DIAGNOSIS — R110 Nausea: Secondary | ICD-10-CM | POA: Insufficient documentation

## 2016-10-12 DIAGNOSIS — I2510 Atherosclerotic heart disease of native coronary artery without angina pectoris: Secondary | ICD-10-CM | POA: Diagnosis not present

## 2016-10-12 MED ORDER — MECLIZINE HCL 12.5 MG PO TABS: 12.5000 mg | ORAL_TABLET | Freq: Three times a day (TID) | ORAL | 1 refills | Status: DC | PRN

## 2016-10-12 NOTE — Patient Instructions (Addendum)
Probable Benign Positional Vertigo Start Meclizine. Start Laruth Bouchard - Daroff exercise several times a day as dirrected.    No driving if dizzy Dizziness Dizziness is a common problem. It is a feeling of unsteadiness or light-headedness. You may feel like you are about to faint. Dizziness can lead to injury if you stumble or fall. Anyone can become dizzy, but dizziness is more common in older adults. This condition can be caused by a number of things, including medicines, dehydration, or illness. Follow these instructions at home: Taking these steps may help with your condition: Eating and drinking  Drink enough fluid to keep your urine clear or pale yellow. This helps to keep you from becoming dehydrated. Try to drink more clear fluids, such as water.  Do not drink alcohol.  Limit your caffeine intake if directed by your health care provider.  Limit your salt intake if directed by your health care provider. Activity  Avoid making quick movements. ? Rise slowly from chairs and steady yourself until you feel okay. ? In the morning, first sit up on the side of the bed. When you feel okay, stand slowly while you hold onto something until you know that your balance is fine.  Move your legs often if you need to stand in one place for a long time. Tighten and relax your muscles in your legs while you are standing.  Do not drive or operate heavy machinery if you feel dizzy.  Avoid bending down if you feel dizzy. Place items in your home so that they are easy for you to reach without leaning over. Lifestyle  Do not use any tobacco products, including cigarettes, chewing tobacco, or electronic cigarettes. If you need help quitting, ask your health care provider.  Try to reduce your stress level, such as with yoga or meditation. Talk with your health care provider if you need help. General instructions  Watch your dizziness for any changes.  Take medicines only as directed by your health  care provider. Talk with your health care provider if you think that your dizziness is caused by a medicine that you are taking.  Tell a friend or a family member that you are feeling dizzy. If he or she notices any changes in your behavior, have this person call your health care provider.  Keep all follow-up visits as directed by your health care provider. This is important. Contact a health care provider if:  Your dizziness does not go away.  Your dizziness or light-headedness gets worse.  You feel nauseous.  You have reduced hearing.  You have new symptoms.  You are unsteady on your feet or you feel like the room is spinning. Get help right away if:  You vomit or have diarrhea and are unable to eat or drink anything.  You have problems talking, walking, swallowing, or using your arms, hands, or legs.  You feel generally weak.  You are not thinking clearly or you have trouble forming sentences. It may take a friend or family member to notice this.  You have chest pain, abdominal pain, shortness of breath, or sweating.  Your vision changes.  You notice any bleeding.  You have a headache.  You have neck pain or a stiff neck.  You have a fever. This information is not intended to replace advice given to you by your health care provider. Make sure you discuss any questions you have with your health care provider. Document Released: 07/14/2000 Document Revised: 06/26/2015 Document Reviewed: 01/14/2014 Elsevier Interactive  Patient Education  2017 [employer] Inc.  

## 2016-10-12 NOTE — Progress Notes (Signed)
Subjective:  Patient ID: [name], male    DOB: 26-Apr-1923  Age: 83 y.o. MRN: [id number]  CC: No chief complaint on file.   HPI [name] presents for vertigo x 1 week. C/o nausea. No HA. He threw up once. Better today. Comes w/wife and dtr  Outpatient Medications Prior to Visit  Medication Sig Dispense Refill  . aspirin 81 MG EC tablet Take 81 mg by mouth daily.      [name] b complex vitamins tablet Take 1 tablet by mouth daily.      . carvedilol (COREG) 6.25 MG tablet Take 1 tablet (6.25 mg total) by mouth 2 (two) times daily with a meal. 180 tablet 3  . cephALEXin (KEFLEX) 500 MG capsule Take 4 capsules (2,000 mg total) by mouth daily. Take 4 capsules by mouth one hour before dental appointment 4 capsule 0  . Cholecalciferol 1000 UNITS tablet Take 1,000 Units by mouth daily.      . clopidogrel (PLAVIX) 75 MG tablet Take 1 tablet (75 mg total) by mouth daily. 90 tablet 2  . erythromycin ophthalmic ointment Place 1 application into both eyes at bedtime. 3.5 g 0  . finasteride (PROSCAR) 5 MG tablet Take 1 tablet (5 mg total) by mouth daily. 90 tablet 1  . Glucosamine-Chondroit-Vit C-Mn (GLUCOSAMINE 1500 COMPLEX PO) Take 1 tablet by mouth 2 (two) times daily.     [name] lisinopril (PRINIVIL,ZESTRIL) 10 MG tablet Take 1 tablet (10 mg total) by mouth 2 (two) times daily. 60 tablet 5  . nitroGLYCERIN (NITROSTAT) 0.4 MG SL tablet Place 1 tablet (0.4 mg total) under the tongue every 5 (five) minutes as needed for chest pain. 25 tablet 2  . pantoprazole (PROTONIX) 40 MG tablet Take 1 tablet (40 mg total) by mouth daily. 90 tablet 3  . polyethylene glycol (MIRALAX / GLYCOLAX) packet Take 17 g by mouth daily.    . simvastatin (ZOCOR) 80 MG tablet Take 1 tablet (80 mg total) by mouth at bedtime. appt due in Oct w/labs must see provider for refills 90 tablet 0  . vitamin C (ASCORBIC ACID) 500 MG tablet Take 500 mg by mouth daily.     No facility-administered medications prior to visit.      ROS Review of Systems  Constitutional: Negative for appetite change, fatigue and unexpected weight change.  HENT: Negative for congestion, nosebleeds, sneezing, sore throat and trouble swallowing.   Eyes: Negative for itching and visual disturbance.  Respiratory: Negative for cough.   Cardiovascular: Negative for chest pain, palpitations and leg swelling.  Gastrointestinal: Negative for abdominal distention, blood in stool, diarrhea and nausea.  Genitourinary: Negative for frequency and hematuria.  Musculoskeletal: Negative for back pain, gait problem, joint swelling and neck pain.  Skin: Negative for rash.  Neurological: Positive for dizziness. Negative for tremors, speech difficulty and weakness.  Psychiatric/Behavioral: Negative for agitation, dysphoric mood and sleep disturbance. The patient is not nervous/anxious.     Objective:  BP 132/72 (BP Location: Right Arm, Cuff Size: Normal)   Pulse (!) 50   Temp 97.8 F (36.6 C) (Oral)   Ht 5\' 5"  (1.651 m)   Wt 157 lb (71.2 kg)   SpO2 98%   BMI 26.13 kg/m   BP Readings from Last 3 Encounters:  10/12/16 132/72  05/03/16 138/76  04/26/16 (!) 142/78    Wt Readings from Last 3 Encounters:  10/12/16 157 lb (71.2 kg)  05/03/16 161 lb (73 kg)  04/26/16 160 lb (72.6 kg)  Physical Exam  Constitutional: He is oriented to person, place, and time. He appears well-developed. No distress.  NAD  HENT:  Mouth/Throat: Oropharynx is clear and moist.  Eyes: Pupils are equal, round, and reactive to light. Conjunctivae are normal.  Neck: Normal range of motion. No JVD present. No thyromegaly present.  Cardiovascular: Normal rate, regular rhythm, normal heart sounds and intact distal pulses.  Exam reveals no gallop and no friction rub.   No murmur heard. Pulmonary/Chest: Effort normal and breath sounds normal. No respiratory distress. He has no wheezes. He has no rales. He exhibits no tenderness.  Abdominal: Soft. Bowel sounds are  normal. He exhibits no distension and no mass. There is no tenderness. There is no rebound and no guarding.  Musculoskeletal: Normal range of motion. He exhibits no edema or tenderness.  Lymphadenopathy:    He has no cervical adenopathy.  Neurological: He is alert and oriented to person, place, and time. He has normal reflexes. No cranial nerve deficit. He exhibits normal muscle tone. He displays a negative Romberg sign. Coordination and gait normal.  Skin: Skin is warm and dry. No rash noted.  Psychiatric: He has a normal mood and affect. His behavior is normal. Judgment and thought content normal.   H-P (-) B  I spent total of >20 minutes face-to-face with patient and greater than 50% was spent counseling on vertigo sx's and treatment   Lab Results  Component Value Date   WBC 7.1 05/03/2016   HGB 13.9 05/03/2016   HCT 41.3 05/03/2016   PLT 190.0 05/03/2016   GLUCOSE 145 (H) 05/03/2016   CHOL 149 07/28/2015   TRIG 65.0 07/28/2015   HDL 74.10 07/28/2015   LDLCALC 62 07/28/2015   ALT 14 05/03/2016   AST 22 05/03/2016   NA 140 05/03/2016   K 3.9 05/03/2016   CL 105 05/03/2016   CREATININE 1.04 05/03/2016   BUN 17 05/03/2016   CO2 28 05/03/2016   TSH 3.02 05/03/2016   PSA 6.45 (H) 08/07/2013   HGBA1C 6.4 12/05/2012    No results found.  Assessment & Plan:   There are no diagnoses linked to this encounter. I am having Mr. Reader maintain his aspirin, b complex vitamins, Glucosamine-Chondroit-Vit C-Mn (GLUCOSAMINE 1500 COMPLEX PO), Cholecalciferol, polyethylene glycol, vitamin C, nitroGLYCERIN, cephALEXin, pantoprazole, carvedilol, finasteride, clopidogrel, erythromycin, lisinopril, and simvastatin.  No orders of the defined types were placed in this encounter.    Follow-up: No Follow-up on file.  Walker Kehr, MD

## 2016-10-12 NOTE — Assessment & Plan Note (Signed)
Meclizine prn 

## 2016-10-12 NOTE — Assessment & Plan Note (Signed)
?   BPV  Start Meclizine. Start Laruth Bouchard - Daroff exercise several times a day as dirrected. Depo-medrol 80 mg IM

## 2016-10-14 MED ORDER — METHYLPREDNISOLONE ACETATE 80 MG/ML IJ SUSP
80.0000 mg | Freq: Once | INTRAMUSCULAR | Status: AC
  Administered 2016-10-12: 80 mg via INTRAMUSCULAR

## 2016-10-14 NOTE — Addendum Note (Signed)
Addended by: Karren Cobble on: 10/14/2016 04:43 PM   Modules accepted: Orders

## 2016-10-27 ENCOUNTER — Encounter: Payer: Self-pay | Admitting: Cardiovascular Disease

## 2016-11-02 NOTE — Progress Notes (Signed)
Pre visit review using our clinic review tool, if applicable. No additional management support is needed unless otherwise documented below in the visit note. 

## 2016-11-02 NOTE — Progress Notes (Addendum)
Subjective:   [name] is a 83 y.o. male who presents for Medicare Annual/Subsequent preventive examination.  Review of Systems:  No ROS.  Medicare Wellness Visit. Additional risk factors are reflected in the social history.  Cardiac Risk Factors include: advanced age (>71men, >107 women);dyslipidemia;hypertension;male gender Sleep patterns: feels rested on waking, gets up 2-3 times nightly to void and sleeps 8-9 hours nightly.    Home Safety/Smoke Alarms: Feels safe in home. Smoke alarms in place.  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 Lives at independent living facility with wife Seat Belt Safety/Bike Helmet: Wears seat belt.      Objective:    Vitals: BP 118/66 (BP Location: Left Arm, Patient Position: Sitting, Cuff Size: Large)   Pulse 62   Temp 97.8 F (36.6 C) (Oral)   Ht 5\' 5"  (1.651 m)   Wt 155 lb (70.3 kg)   SpO2 98%   BMI 25.79 kg/m   Body mass index is 25.79 kg/m.  Tobacco History  Smoking Status  . Never Smoker  Smokeless Tobacco  . Never Used     Counseling given: Not Answered   Past Medical History:  Diagnosis Date  . Coronary atherosclerosis of unspecified type of vessel, native or graft   . Dizziness and giddiness   . Hypertrophy of prostate without urinary obstruction and other lower urinary tract symptoms (LUTS)   . MI (myocardial infarction) ([place])   . Osteoarthrosis, unspecified whether generalized or localized, unspecified site   . Other and unspecified hyperlipidemia   . Shortness of breath dyspnea    with exertion  . Thrombocytopenia, unspecified ([place])   . Unspecified essential hypertension    Past Surgical History:  Procedure Laterality Date  . COLONOSCOPY    . CORONARY ANGIOPLASTY  01/07/2001, 2008   2 stents  . ESOPHAGOGASTRODUODENOSCOPY N/A 04/22/2014   Procedure: ESOPHAGOGASTRODUODENOSCOPY (EGD);   Surgeon: Milus Banister, MD;  Location: [facility];  Service: Endoscopy;  Laterality: N/A;  . HIP ARTHROPLASTY Right 2006  . TRANSURETHRAL RESECTION OF PROSTATE     Family History  Problem Relation Age of Onset  . Heart disease Father   . Coronary artery disease Other   . Colon cancer Neg Hx    History  Sexual Activity  . Sexual activity: Not Currently    Outpatient Encounter Prescriptions as of 11/03/2016  Medication Sig  . aspirin 81 MG EC tablet Take 81 mg by mouth daily.    [name] b complex vitamins tablet Take 1 tablet by mouth daily.    . carvedilol (COREG) 6.25 MG tablet Take 1 tablet (6.25 mg total) by mouth 2 (two) times daily with a meal.  . cephALEXin (KEFLEX) 500 MG capsule Take 4 capsules (2,000 mg total) by mouth daily. Take 4 capsules by mouth one hour before dental appointment  . Cholecalciferol 1000 UNITS tablet Take 1,000 Units by mouth daily.    . clopidogrel (PLAVIX) 75 MG tablet Take 1 tablet (75 mg total) by mouth daily.  [name] erythromycin ophthalmic ointment Place 1 application into both eyes at bedtime.  . finasteride (PROSCAR) 5 MG tablet Take 1 tablet (5 mg total) by mouth daily.  . Glucosamine-Chondroit-Vit C-Mn (GLUCOSAMINE 1500 COMPLEX PO) Take 1 tablet by mouth 2 (two) times daily.   [name] lisinopril (PRINIVIL,ZESTRIL) 10 MG tablet Take 1 tablet (10 mg total) by mouth 2 (two) times daily.  . meclizine (ANTIVERT) 12.5 MG tablet  Take 1 tablet (12.5 mg total) by mouth 3 (three) times daily as needed for dizziness.  . nitroGLYCERIN (NITROSTAT) 0.4 MG SL tablet Place 1 tablet (0.4 mg total) under the tongue every 5 (five) minutes as needed for chest pain.  . pantoprazole (PROTONIX) 40 MG tablet Take 1 tablet (40 mg total) by mouth daily.  . polyethylene glycol (MIRALAX / GLYCOLAX) packet Take 17 g by mouth daily.  . simvastatin (ZOCOR) 80 MG tablet Take 1 tablet (80 mg total) by mouth at bedtime. appt due in Oct w/labs must see provider for refills  . vitamin C  (ASCORBIC ACID) 500 MG tablet Take 500 mg by mouth daily.  . [EXPIRED] methylPREDNISolone acetate (DEPO-MEDROL) injection 80 mg    No facility-administered encounter medications on file as of 11/03/2016.     Activities of Daily Living In your present state of health, do you have any difficulty performing the following activities: 11/03/2016  Hearing? Y  Vision? N  Difficulty concentrating or making decisions? N  Walking or climbing stairs? Y  Dressing or bathing? N  Doing errands, shopping? Y  Preparing Food and eating ? Y  Using the Toilet? N  In the past six months, have you accidently leaked urine? N  Do you have problems with loss of bowel control? N  Managing your Medications? N  Managing your Finances? N  Housekeeping or managing your Housekeeping? Y  Some recent data might be hidden    Patient Care Team: Plotnikov, Evie Lacks, MD as PCP - General Angelena Form Annita Brod, MD (Cardiology) Jarome Matin, MD (Dermatology)   Assessment:    Physical assessment deferred to PCP.  Exercise Activities and Dietary recommendations Current Exercise Habits: Structured exercise class, Type of exercise: yoga;walking;strength training/weights, Time (Minutes): 45, Frequency (Times/Week): 3, Weekly Exercise (Minutes/Week): 135, Intensity: Mild, Exercise limited by: orthopedic condition(s)  Diet (meal preparation, eat out, water intake, caffeinated beverages, dairy products, fruits and vegetables): in general, a "healthy" diet  , well balanced   Encouraged patient to increase daily water intake. Discussed supplementing with ensure when appetite is decreased.  Goals    . stay as healthy and as independent as possible          Enjoy social activities at [employer] and continue to go to the gym.      Fall Risk Fall Risk  11/03/2016 10/12/2016 04/28/2015 04/10/2014  Falls in the past year? No No No No   Depression Screen PHQ 2/9 Scores 11/03/2016 10/12/2016 04/28/2015 04/10/2014  PHQ - 2 Score 2  0 0 0  PHQ- 9 Score 5 - - -    Cognitive Function MMSE - Mini Mental State Exam 11/03/2016  Orientation to time 5  Orientation to Place 5  Registration 3  Attention/ Calculation 4  Recall 2  Language- name 2 objects 2  Language- repeat 1  Language- follow 3 step command 3  Language- read & follow direction 1  Write a sentence 1  Copy design 1  Total score 28        Immunization History  Administered Date(s) Administered  . H1N1 03/11/2008  . Influenza Split 11/17/2010  . Influenza Whole 11/02/2011  . Influenza, High Dose Seasonal PF 11/11/2015, 10/26/2016  . Influenza,inj,Quad PF,6+ Mos 10/28/2014  . Influenza-Unspecified 11/27/2012, 11/01/2013  . Pneumococcal Conjugate-13 03/07/2013  . Pneumococcal Polysaccharide-23 06/12/2008  . Td 04/22/2009   Screening Tests Health Maintenance  Topic Date Due  . INFLUENZA VACCINE  10/14/2017 (Originally 09/01/2016)  . TETANUS/TDAP  04/23/2019  .  PNA vac Low Risk Adult  Completed      Plan:    Continue doing brain stimulating activities (puzzles, reading, adult coloring books, staying active) to keep memory sharp.   Continue to eat heart healthy diet (full of fruits, vegetables, whole grains, lean protein, water--limit salt, fat, and sugar intake) and increase physical activity as tolerated.  I have personally reviewed and noted the following in the patient's chart:   . Medical and social history . Use of alcohol, tobacco or illicit drugs  . Current medications and supplements . Functional ability and status . Nutritional status . Physical activity . Advanced directives . List of other physicians . Vitals . Screenings to include cognitive, depression, and falls . Referrals and appointments  In addition, I have reviewed and discussed with patient certain preventive protocols, quality metrics, and best practice recommendations. A written personalized care plan for preventive services as well as general preventive health  recommendations were provided to patient.     Michiel Cowboy, RN  11/03/2016  Medical screening examination/treatment/procedure(s) were performed by non-physician practitioner and as supervising physician I was immediately available for consultation/collaboration. I agree with above. Lew Dawes, MD

## 2016-11-03 ENCOUNTER — Other Ambulatory Visit (INDEPENDENT_AMBULATORY_CARE_PROVIDER_SITE_OTHER): Payer: Medicare Other

## 2016-11-03 ENCOUNTER — Ambulatory Visit (INDEPENDENT_AMBULATORY_CARE_PROVIDER_SITE_OTHER): Payer: Medicare Other | Admitting: Internal Medicine

## 2016-11-03 ENCOUNTER — Encounter: Payer: Self-pay | Admitting: Internal Medicine

## 2016-11-03 VITALS — BP 118/66 | HR 62 | Temp 97.8°F | Ht 65.0 in | Wt 155.0 lb

## 2016-11-03 DIAGNOSIS — R42 Dizziness and giddiness: Secondary | ICD-10-CM

## 2016-11-03 DIAGNOSIS — R7309 Other abnormal glucose: Secondary | ICD-10-CM | POA: Diagnosis not present

## 2016-11-03 DIAGNOSIS — E785 Hyperlipidemia, unspecified: Secondary | ICD-10-CM | POA: Diagnosis not present

## 2016-11-03 DIAGNOSIS — I2510 Atherosclerotic heart disease of native coronary artery without angina pectoris: Secondary | ICD-10-CM | POA: Diagnosis not present

## 2016-11-03 DIAGNOSIS — Z0000 Encounter for general adult medical examination without abnormal findings: Secondary | ICD-10-CM | POA: Diagnosis not present

## 2016-11-03 DIAGNOSIS — R269 Unspecified abnormalities of gait and mobility: Secondary | ICD-10-CM | POA: Insufficient documentation

## 2016-11-03 LAB — BASIC METABOLIC PANEL
BUN: 20 mg/dL (ref 6–23)
CO2: 30 mEq/L (ref 19–32)
Calcium: 9.5 mg/dL (ref 8.4–10.5)
Chloride: 101 mEq/L (ref 96–112)
Creatinine, Ser: 1.12 mg/dL (ref 0.40–1.50)
GFR: 65.29 mL/min (ref >60.00)
Glucose, Bld: 123 mg/dL — ABNORMAL HIGH (ref 70–99)
Potassium: 4.1 mEq/L (ref 3.5–5.1)
Sodium: 138 mEq/L (ref 135–145)

## 2016-11-03 LAB — SEDIMENTATION RATE: Sed Rate: 29 mm/hr — ABNORMAL HIGH (ref 0–20)

## 2016-11-03 LAB — CK: Total CK: 98 U/L (ref 7–232)

## 2016-11-03 MED ORDER — METHYLPREDNISOLONE ACETATE 80 MG/ML IJ SUSP
80.0000 mg | Freq: Once | INTRAMUSCULAR | Status: AC
  Administered 2016-11-03: 80 mg via INTRAMUSCULAR

## 2016-11-03 NOTE — Assessment & Plan Note (Signed)
CPK

## 2016-11-03 NOTE — Progress Notes (Signed)
Subjective:  Patient ID: [name], male    DOB: Mar 03, 1923  Age: 83 y.o. MRN: [id number]  CC: No chief complaint on file.   HPI [name] presents for vertigo x 1 mo; not better w/meds. No falls, no n/v  Outpatient Medications Prior to Visit  Medication Sig Dispense Refill  . aspirin 81 MG EC tablet Take 81 mg by mouth daily.      [name] b complex vitamins tablet Take 1 tablet by mouth daily.      . carvedilol (COREG) 6.25 MG tablet Take 1 tablet (6.25 mg total) by mouth 2 (two) times daily with a meal. 180 tablet 3  . cephALEXin (KEFLEX) 500 MG capsule Take 4 capsules (2,000 mg total) by mouth daily. Take 4 capsules by mouth one hour before dental appointment 4 capsule 0  . Cholecalciferol 1000 UNITS tablet Take 1,000 Units by mouth daily.      . clopidogrel (PLAVIX) 75 MG tablet Take 1 tablet (75 mg total) by mouth daily. 90 tablet 2  . erythromycin ophthalmic ointment Place 1 application into both eyes at bedtime. 3.5 g 0  . finasteride (PROSCAR) 5 MG tablet Take 1 tablet (5 mg total) by mouth daily. 90 tablet 1  . Glucosamine-Chondroit-Vit C-Mn (GLUCOSAMINE 1500 COMPLEX PO) Take 1 tablet by mouth 2 (two) times daily.     [name] lisinopril (PRINIVIL,ZESTRIL) 10 MG tablet Take 1 tablet (10 mg total) by mouth 2 (two) times daily. 60 tablet 5  . meclizine (ANTIVERT) 12.5 MG tablet Take 1 tablet (12.5 mg total) by mouth 3 (three) times daily as needed for dizziness. 60 tablet 1  . nitroGLYCERIN (NITROSTAT) 0.4 MG SL tablet Place 1 tablet (0.4 mg total) under the tongue every 5 (five) minutes as needed for chest pain. 25 tablet 2  . pantoprazole (PROTONIX) 40 MG tablet Take 1 tablet (40 mg total) by mouth daily. 90 tablet 3  . polyethylene glycol (MIRALAX / GLYCOLAX) packet Take 17 g by mouth daily.    . simvastatin (ZOCOR) 80 MG tablet Take 1 tablet (80 mg total) by mouth at bedtime. appt due in Oct w/labs must see provider for refills 90 tablet 0  . vitamin C (ASCORBIC ACID) 500 MG  tablet Take 500 mg by mouth daily.     No facility-administered medications prior to visit.     ROS Review of Systems  Constitutional: Negative for appetite change, fatigue and unexpected weight change.  HENT: Negative for congestion, nosebleeds, sneezing, sore throat and trouble swallowing.   Eyes: Negative for itching and visual disturbance.  Respiratory: Negative for cough.   Cardiovascular: Negative for chest pain, palpitations and leg swelling.  Gastrointestinal: Negative for abdominal distention, blood in stool, diarrhea and nausea.  Genitourinary: Negative for frequency and hematuria.  Musculoskeletal: Negative for back pain, gait problem, joint swelling and neck pain.  Skin: Negative for rash.  Neurological: Positive for dizziness. Negative for tremors, speech difficulty, weakness and light-headedness.  Psychiatric/Behavioral: Negative for agitation, dysphoric mood and sleep disturbance. The patient is not nervous/anxious.     Objective:  BP 118/66 (BP Location: Left Arm, Patient Position: Sitting, Cuff Size: Large)   Pulse 62   Temp 97.8 F (36.6 C) (Oral)   Ht 5\' 5"  (1.651 m)   Wt 155 lb (70.3 kg)   SpO2 98%   BMI 25.79 kg/m   BP Readings from Last 3 Encounters:  11/03/16 118/66  10/12/16 132/72  05/03/16 138/76    Wt Readings from Last  3 Encounters:  11/03/16 155 lb (70.3 kg)  10/12/16 157 lb (71.2 kg)  05/03/16 161 lb (73 kg)    Physical Exam  Constitutional: He is oriented to person, place, and time. He appears well-developed. No distress.  NAD  HENT:  Mouth/Throat: Oropharynx is clear and moist.  Eyes: Pupils are equal, round, and reactive to light. Conjunctivae are normal.  Neck: Normal range of motion. No JVD present. No thyromegaly present.  Cardiovascular: Normal rate, regular rhythm, normal heart sounds and intact distal pulses.  Exam reveals no gallop and no friction rub.   No murmur heard. Pulmonary/Chest: Effort normal and breath sounds  normal. No respiratory distress. He has no wheezes. He has no rales. He exhibits no tenderness.  Abdominal: Soft. Bowel sounds are normal. He exhibits no distension and no mass. There is no tenderness. There is no rebound and no guarding.  Musculoskeletal: Normal range of motion. He exhibits no edema or tenderness.  Lymphadenopathy:    He has no cervical adenopathy.  Neurological: He is alert and oriented to person, place, and time. He has normal reflexes. No cranial nerve deficit. He exhibits normal muscle tone. He displays a negative Romberg sign. Coordination abnormal. Gait normal.  Skin: Skin is warm and dry. No rash noted.  Psychiatric: He has a normal mood and affect. His behavior is normal. Judgment and thought content normal.    Lab Results  Component Value Date   WBC 7.1 05/03/2016   HGB 13.9 05/03/2016   HCT 41.3 05/03/2016   PLT 190.0 05/03/2016   GLUCOSE 145 (H) 05/03/2016   CHOL 149 07/28/2015   TRIG 65.0 07/28/2015   HDL 74.10 07/28/2015   LDLCALC 62 07/28/2015   ALT 14 05/03/2016   AST 22 05/03/2016   NA 140 05/03/2016   K 3.9 05/03/2016   CL 105 05/03/2016   CREATININE 1.04 05/03/2016   BUN 17 05/03/2016   CO2 28 05/03/2016   TSH 3.02 05/03/2016   PSA 6.45 (H) 08/07/2013   HGBA1C 6.4 12/05/2012    No results found.  Assessment & Plan:   There are no diagnoses linked to this encounter. I am having [name] maintain his aspirin, b complex vitamins, Glucosamine-Chondroit-Vit C-Mn (GLUCOSAMINE 1500 COMPLEX PO), Cholecalciferol, polyethylene glycol, vitamin C, nitroGLYCERIN, cephALEXin, pantoprazole, carvedilol, finasteride, clopidogrel, erythromycin, lisinopril, simvastatin, and meclizine.  No orders of the defined types were placed in this encounter.    Follow-up: No Follow-up on file.  Walker Kehr, MD

## 2016-11-03 NOTE — Assessment & Plan Note (Signed)
Worse Walker CPK PT

## 2016-11-03 NOTE — Assessment & Plan Note (Signed)
BMET 

## 2016-11-03 NOTE — Patient Instructions (Addendum)
Continue doing brain stimulating activities (puzzles, reading, adult coloring books, staying active) to keep memory sharp.   Continue to eat heart healthy diet (full of fruits, vegetables, whole grains, lean protein, water--limit salt, fat, and sugar intake) and increase physical activity as tolerated.   [name] , Thank you for taking time to come for your Medicare Wellness Visit. I appreciate your ongoing commitment to your health goals. Please review the following plan we discussed and let me know if I can assist you in the future.   These are the goals we discussed: Goals    . stay as healthy and as independent as possible          Enjoy social activities at [facility] and continue to go to the gym.       This is a list of the screening recommended for you and due dates:  Health Maintenance  Topic Date Due  . Flu Shot  10/14/2017*  . Tetanus Vaccine  04/23/2019  . Pneumonia vaccines  Completed  *Topic was postponed. The date shown is not the original due date.

## 2016-11-03 NOTE — Assessment & Plan Note (Signed)
Not better PT for vestibular rehab at [facility]  Meclizine prn Depo-medrol 80 mg IM

## 2016-11-04 ENCOUNTER — Ambulatory Visit (INDEPENDENT_AMBULATORY_CARE_PROVIDER_SITE_OTHER): Payer: Medicare Other | Admitting: Cardiovascular Disease

## 2016-11-04 ENCOUNTER — Telehealth: Payer: Self-pay | Admitting: Internal Medicine

## 2016-11-04 VITALS — BP 128/60 | HR 56 | Ht 65.0 in | Wt 154.4 lb

## 2016-11-04 DIAGNOSIS — I2510 Atherosclerotic heart disease of native coronary artery without angina pectoris: Secondary | ICD-10-CM

## 2016-11-04 DIAGNOSIS — I10 Essential (primary) hypertension: Secondary | ICD-10-CM

## 2016-11-04 DIAGNOSIS — Z23 Encounter for immunization: Secondary | ICD-10-CM | POA: Diagnosis not present

## 2016-11-04 MED ORDER — FINASTERIDE 5 MG PO TABS: 5.0000 mg | ORAL_TABLET | Freq: Every day | ORAL | 1 refills | Status: DC

## 2016-11-04 NOTE — Telephone Encounter (Signed)
Reviewed chart pt is up-to-date sent refills to [facility].../lmb  

## 2016-11-04 NOTE — Telephone Encounter (Signed)
Pt need refill on his finasteride (PROSCAR) 5 MG tablet [[id number]]    [facility]

## 2016-11-04 NOTE — Progress Notes (Signed)
Chief Complaint  Patient presents with  . Follow-up    CAD    History of Present Illness: 83 yo male with history of CAD, HTN and HLD here today for cardiac follow up. He is had an inferior MI in 2002 and had a bare metal stent placed in the circumflex artery and subsequent unstable angina August 2007 with placement of drug eluting stent in the mid LAD. He has had no ischemic testing since 2012.   He is here today for follow up. The patient denies any chest pain, dyspnea, palpitations, lower extremity edema, orthopnea, PND, dizziness, near syncope or syncope.   Primary Care Physician: Cassandria Anger, MD   Past Medical History:  Diagnosis Date  . Coronary atherosclerosis of unspecified type of vessel, native or graft   . Dizziness and giddiness   . Hypertrophy of prostate without urinary obstruction and other lower urinary tract symptoms (LUTS)   . MI (myocardial infarction) ([place])   . Osteoarthrosis, unspecified whether generalized or localized, unspecified site   . Other and unspecified hyperlipidemia   . Shortness of breath dyspnea    with exertion  . Thrombocytopenia, unspecified ([place])   . Unspecified essential hypertension     Past Surgical History:  Procedure Laterality Date  . COLONOSCOPY    . CORONARY ANGIOPLASTY  01/07/2001, 2008   2 stents  . ESOPHAGOGASTRODUODENOSCOPY N/A 04/22/2014   Procedure: ESOPHAGOGASTRODUODENOSCOPY (EGD);  Surgeon: Milus Banister, MD;  Location: [facility] [facility];  Service: Endoscopy;  Laterality: N/A;  . HIP ARTHROPLASTY Right 2006  . TRANSURETHRAL RESECTION OF PROSTATE      Current Outpatient Prescriptions  Medication Sig Dispense Refill  . aspirin 81 MG EC tablet Take 81 mg by mouth daily.      [name] b complex vitamins tablet Take 1 tablet by mouth daily.      . carvedilol (COREG) 6.25 MG tablet Take 1 tablet (6.25 mg total) by mouth 2 (two) times daily with a meal. 180 tablet 3  . cephALEXin (KEFLEX) 500 MG capsule Take 4 capsules (2,000  mg total) by mouth daily. Take 4 capsules by mouth one hour before dental appointment 4 capsule 0  . Cholecalciferol 1000 UNITS tablet Take 1,000 Units by mouth daily.      . clopidogrel (PLAVIX) 75 MG tablet Take 1 tablet (75 mg total) by mouth daily. 90 tablet 2  . erythromycin ophthalmic ointment Place 1 application into both eyes at bedtime. 3.5 g 0  . finasteride (PROSCAR) 5 MG tablet Take 1 tablet (5 mg total) by mouth daily. 90 tablet 1  . Glucosamine-Chondroit-Vit C-Mn (GLUCOSAMINE 1500 COMPLEX PO) Take 1 tablet by mouth 2 (two) times daily.     [name] lisinopril (PRINIVIL,ZESTRIL) 10 MG tablet Take 1 tablet (10 mg total) by mouth 2 (two) times daily. 60 tablet 5  . meclizine (ANTIVERT) 12.5 MG tablet Take 1 tablet (12.5 mg total) by mouth 3 (three) times daily as needed for dizziness. 60 tablet 1  . nitroGLYCERIN (NITROSTAT) 0.4 MG SL tablet Place 1 tablet (0.4 mg total) under the tongue every 5 (five) minutes as needed for chest pain. 25 tablet 2  . pantoprazole (PROTONIX) 40 MG tablet Take 1 tablet (40 mg total) by mouth daily. 90 tablet 3  . polyethylene glycol (MIRALAX / GLYCOLAX) packet Take 17 g by mouth daily.    . simvastatin (ZOCOR) 80 MG tablet Take 1 tablet (80 mg total) by mouth at bedtime. appt due in Oct w/labs must see provider for  refills 90 tablet 0  . vitamin C (ASCORBIC ACID) 500 MG tablet Take 500 mg by mouth daily.     No current facility-administered medications for this visit.     Allergies  Allergen Reactions  . Oxycodone Nausea And Vomiting  . Penicillins Hives    Social History   Social History  . Marital status: Married    Spouse name: [name]  . Number of children: 2  . Years of education: N/A   Occupational History  . Retired Retired   Social History Main Topics  . Smoking status: Never Smoker  . Smokeless tobacco: Never Used  . Alcohol use No  . Drug use: No  . Sexual activity: Not Currently   Other Topics Concern  . Not on file   Social  History Narrative   Regular Exercise -  YES          Family History  Problem Relation Age of Onset  . Heart disease Father   . Coronary artery disease Other   . Colon cancer Neg Hx     Review of Systems:  As stated in the HPI and otherwise negative.   BP 128/60   Pulse (!) 56   Ht 5\' 5"  (1.651 m)   Wt 154 lb 6.4 oz (70 kg)   SpO2 97%   BMI 25.69 kg/m   Physical Examination:  General: Well developed, well nourished, NAD  HEENT: OP clear, mucus membranes moist  SKIN: warm, dry. No rashes. Neuro: No focal deficits  Musculoskeletal: Muscle strength 5/5 all ext  Psychiatric: Mood and affect normal  Neck: No JVD, no carotid bruits, no thyromegaly, no lymphadenopathy.  Lungs:Clear bilaterally, no wheezes, rhonci, crackles Cardiovascular: Regular rate and rhythm. No murmurs, gallops or rubs. Abdomen:Soft. Bowel sounds present. Non-tender.  Extremities: No lower extremity edema. Pulses are 2 + in the bilateral DP/PT.  EKG:  EKG is not  ordered today. The ekg ordered today demonstrates Sinus brady, rate 57 bpm.  T wave inversions inferior and lateral leads. Unchanged.   Recent Labs: 05/03/2016: ALT 14; Hemoglobin 13.9; Platelets 190.0; TSH 3.02 11/03/2016: BUN 20; Creatinine, Ser 1.12; Potassium 4.1; Sodium 138   Lipid Panel    Component Value Date/Time   CHOL 149 07/28/2015 1408   TRIG 65.0 07/28/2015 1408   HDL 74.10 07/28/2015 1408   CHOLHDL 2 07/28/2015 1408   VLDL 13.0 07/28/2015 1408   LDLCALC 62 07/28/2015 1408     Wt Readings from Last 3 Encounters:  11/04/16 154 lb 6.4 oz (70 kg)  11/03/16 155 lb (70.3 kg)  10/12/16 157 lb (71.2 kg)     Other studies Reviewed: Additional studies/ records that were reviewed today include: . Review of the above records demonstrates:    Assessment and Plan:   1. CAD without angina: No chest pain suggestive of angina. Will continue ASA, beta blocker, Ace-inh and statin.     2. HTN: BP controlled. No changes today.    Current medicines are reviewed at length with the patient today.  The patient does not have concerns regarding medicines.  The following changes have been made:  no change  Labs/ tests ordered today include:   No orders of the defined types were placed in this encounter.   Disposition:   FU with me in 6  months  Signed, Lauree Chandler, MD 11/04/2016 3:22 PM    [facility] [address], [place], [place]  [address] Phone: [phone]; Fax: [phone]

## 2016-11-04 NOTE — Patient Instructions (Signed)

## 2016-11-22 ENCOUNTER — Telehealth: Payer: Self-pay | Admitting: Internal Medicine

## 2016-11-22 MED ORDER — LISINOPRIL 10 MG PO TABS: 10.0000 mg | ORAL_TABLET | Freq: Two times a day (BID) | ORAL | 11 refills | Status: DC

## 2016-11-22 NOTE — Telephone Encounter (Signed)
Pharmacists called for a refill of her lisinopril (PRINIVIL,ZESTRIL) 10 MG tablet Please advise  Please send to [facility] pharmacy

## 2016-11-22 NOTE — Telephone Encounter (Signed)
Reviewed chart pt is up-to-date sent refills to [facility].../lmb  

## 2016-11-25 DIAGNOSIS — R2681 Unsteadiness on feet: Secondary | ICD-10-CM | POA: Diagnosis not present

## 2016-11-25 DIAGNOSIS — M6281 Muscle weakness (generalized): Secondary | ICD-10-CM | POA: Diagnosis not present

## 2016-11-29 ENCOUNTER — Other Ambulatory Visit: Payer: Self-pay | Admitting: *Deleted

## 2016-11-29 DIAGNOSIS — M6281 Muscle weakness (generalized): Secondary | ICD-10-CM | POA: Diagnosis not present

## 2016-11-29 DIAGNOSIS — F4321 Adjustment disorder with depressed mood: Secondary | ICD-10-CM

## 2016-11-29 DIAGNOSIS — I2510 Atherosclerotic heart disease of native coronary artery without angina pectoris: Secondary | ICD-10-CM

## 2016-11-29 DIAGNOSIS — R2681 Unsteadiness on feet: Secondary | ICD-10-CM | POA: Diagnosis not present

## 2016-11-29 DIAGNOSIS — E785 Hyperlipidemia, unspecified: Secondary | ICD-10-CM

## 2016-11-29 DIAGNOSIS — I10 Essential (primary) hypertension: Secondary | ICD-10-CM

## 2016-11-29 MED ORDER — SIMVASTATIN 80 MG PO TABS: 80.0000 mg | ORAL_TABLET | Freq: Every day | ORAL | 11 refills | Status: DC

## 2016-11-29 NOTE — Telephone Encounter (Signed)
Rec'd vm from [place] [facility]ist [facility] pt is needing refills on his Simvastatin. Reviewed chart pt is up-to-date sent refills to  to [facility]...Johny Chess

## 2016-12-01 DIAGNOSIS — R2681 Unsteadiness on feet: Secondary | ICD-10-CM | POA: Diagnosis not present

## 2016-12-01 DIAGNOSIS — M6281 Muscle weakness (generalized): Secondary | ICD-10-CM | POA: Diagnosis not present

## 2016-12-03 DIAGNOSIS — R2681 Unsteadiness on feet: Secondary | ICD-10-CM | POA: Diagnosis not present

## 2016-12-03 DIAGNOSIS — M6281 Muscle weakness (generalized): Secondary | ICD-10-CM | POA: Diagnosis not present

## 2016-12-06 DIAGNOSIS — M6281 Muscle weakness (generalized): Secondary | ICD-10-CM | POA: Diagnosis not present

## 2016-12-06 DIAGNOSIS — R2681 Unsteadiness on feet: Secondary | ICD-10-CM | POA: Diagnosis not present

## 2016-12-08 DIAGNOSIS — M6281 Muscle weakness (generalized): Secondary | ICD-10-CM | POA: Diagnosis not present

## 2016-12-08 DIAGNOSIS — R2681 Unsteadiness on feet: Secondary | ICD-10-CM | POA: Diagnosis not present

## 2016-12-10 DIAGNOSIS — M6281 Muscle weakness (generalized): Secondary | ICD-10-CM | POA: Diagnosis not present

## 2016-12-10 DIAGNOSIS — R2681 Unsteadiness on feet: Secondary | ICD-10-CM | POA: Diagnosis not present

## 2016-12-13 DIAGNOSIS — R2681 Unsteadiness on feet: Secondary | ICD-10-CM | POA: Diagnosis not present

## 2016-12-13 DIAGNOSIS — M6281 Muscle weakness (generalized): Secondary | ICD-10-CM | POA: Diagnosis not present

## 2016-12-15 DIAGNOSIS — M6281 Muscle weakness (generalized): Secondary | ICD-10-CM | POA: Diagnosis not present

## 2016-12-15 DIAGNOSIS — R2681 Unsteadiness on feet: Secondary | ICD-10-CM | POA: Diagnosis not present

## 2016-12-17 DIAGNOSIS — R2681 Unsteadiness on feet: Secondary | ICD-10-CM | POA: Diagnosis not present

## 2016-12-17 DIAGNOSIS — M6281 Muscle weakness (generalized): Secondary | ICD-10-CM | POA: Diagnosis not present

## 2016-12-20 DIAGNOSIS — R2681 Unsteadiness on feet: Secondary | ICD-10-CM | POA: Diagnosis not present

## 2016-12-20 DIAGNOSIS — M6281 Muscle weakness (generalized): Secondary | ICD-10-CM | POA: Diagnosis not present

## 2016-12-22 DIAGNOSIS — M6281 Muscle weakness (generalized): Secondary | ICD-10-CM | POA: Diagnosis not present

## 2016-12-22 DIAGNOSIS — R2681 Unsteadiness on feet: Secondary | ICD-10-CM | POA: Diagnosis not present

## 2016-12-24 DIAGNOSIS — M6281 Muscle weakness (generalized): Secondary | ICD-10-CM | POA: Diagnosis not present

## 2016-12-24 DIAGNOSIS — R2681 Unsteadiness on feet: Secondary | ICD-10-CM | POA: Diagnosis not present

## 2017-02-10 ENCOUNTER — Telehealth: Payer: Self-pay | Admitting: Internal Medicine

## 2017-02-10 ENCOUNTER — Other Ambulatory Visit: Payer: Self-pay | Admitting: *Deleted

## 2017-02-10 MED ORDER — CARVEDILOL 6.25 MG PO TABS: 6.2500 mg | ORAL_TABLET | Freq: Two times a day (BID) | ORAL | 1 refills | Status: DC

## 2017-02-10 NOTE — Telephone Encounter (Signed)
Request for Coreg refill. The prescription has expired.

## 2017-02-10 NOTE — Telephone Encounter (Signed)
Copied from [place]. Topic: Quick Communication - See Telephone Encounter >> Feb 10, 2017 10:25 AM Cleaster Corin, NT wrote: CRM for notification. See Telephone encounter for:   02/10/17. Refill on Carvedilol (Coreg) 6.25 mg  [name] from [facility]. Calling to request refill for pt. He has no refill left.rx. [name] can be reached at [facility], [place] [address] Phone: [phone] Fax: [phone]

## 2017-02-10 NOTE — Telephone Encounter (Signed)
Reviewed chart pt is up-to-date sent refills to [facility].../lmb  

## 2017-02-24 ENCOUNTER — Telehealth: Payer: Self-pay | Admitting: Internal Medicine

## 2017-02-24 DIAGNOSIS — I2510 Atherosclerotic heart disease of native coronary artery without angina pectoris: Secondary | ICD-10-CM

## 2017-02-24 DIAGNOSIS — I10 Essential (primary) hypertension: Secondary | ICD-10-CM

## 2017-02-24 MED ORDER — CLOPIDOGREL BISULFATE 75 MG PO TABS: 75.0000 mg | ORAL_TABLET | Freq: Every day | ORAL | 2 refills | Status: DC

## 2017-02-24 MED ORDER — PANTOPRAZOLE SODIUM 40 MG PO TBEC: 40.0000 mg | DELAYED_RELEASE_TABLET | Freq: Every day | ORAL | 2 refills | Status: DC

## 2017-02-24 NOTE — Telephone Encounter (Signed)
Refill request for Clopidogrel and Pantoprazole.  Last office visit was 11/03/16.  Will refill the Clopidogrel per protocol.  Will send msg to office for Pantoprazole, as this Rx has expired.

## 2017-02-24 NOTE — Telephone Encounter (Signed)
MD has already sent Clopidogrel. Sent new rx for pantoprazole...[name]

## 2017-02-24 NOTE — Telephone Encounter (Signed)
Copied from [place] [id number]. Topic: Quick Communication - See Telephone Encounter >> Feb 24, 2017 10:53 AM Ivar Drape wrote: CRM for notification. See Telephone encounter for:  02/24/17. [facility] w/[facility], [address] would like refills for the following medications:  1) clopidogrel (PLAVIX) 75 MG tablet - please write for 30 day supply  2) pantoprazole (PROTONIX) 40 MG tablet - please write for 30 day supply

## 2017-03-01 ENCOUNTER — Ambulatory Visit (INDEPENDENT_AMBULATORY_CARE_PROVIDER_SITE_OTHER): Payer: Medicare Other | Admitting: Internal Medicine

## 2017-03-01 ENCOUNTER — Other Ambulatory Visit (INDEPENDENT_AMBULATORY_CARE_PROVIDER_SITE_OTHER): Payer: Medicare Other

## 2017-03-01 ENCOUNTER — Encounter: Payer: Self-pay | Admitting: Internal Medicine

## 2017-03-01 DIAGNOSIS — I5020 Unspecified systolic (congestive) heart failure: Secondary | ICD-10-CM | POA: Diagnosis not present

## 2017-03-01 DIAGNOSIS — I10 Essential (primary) hypertension: Secondary | ICD-10-CM | POA: Diagnosis not present

## 2017-03-01 DIAGNOSIS — R269 Unspecified abnormalities of gait and mobility: Secondary | ICD-10-CM | POA: Diagnosis not present

## 2017-03-01 DIAGNOSIS — I2510 Atherosclerotic heart disease of native coronary artery without angina pectoris: Secondary | ICD-10-CM

## 2017-03-01 DIAGNOSIS — E785 Hyperlipidemia, unspecified: Secondary | ICD-10-CM | POA: Diagnosis not present

## 2017-03-01 LAB — BASIC METABOLIC PANEL
BUN: 16 mg/dL (ref 6–23)
CO2: 28 mEq/L (ref 19–32)
Calcium: 8.9 mg/dL (ref 8.4–10.5)
Chloride: 104 mEq/L (ref 96–112)
Creatinine, Ser: 1.15 mg/dL (ref 0.40–1.50)
GFR: 63.28 mL/min (ref >60.00)
Glucose, Bld: 110 mg/dL — ABNORMAL HIGH (ref 70–99)
Potassium: 4.1 mEq/L (ref 3.5–5.1)
Sodium: 139 mEq/L (ref 135–145)

## 2017-03-01 NOTE — Assessment & Plan Note (Signed)
Better w/exercises 

## 2017-03-01 NOTE — Assessment & Plan Note (Signed)
Lisinopril, Coreg 

## 2017-03-01 NOTE — Assessment & Plan Note (Signed)
On Simvastatin 

## 2017-03-01 NOTE — Progress Notes (Signed)
Subjective:  Patient ID: [name], male    DOB: 07-Jul-1923  Age: 84 y.o. MRN: [id number]  CC: No chief complaint on file.   HPI ATHEL MERRIWEATHER presents for CAD, HTN, BPH f/u. Exercising a lot  Outpatient Medications Prior to Visit  Medication Sig Dispense Refill  . aspirin 81 MG EC tablet Take 81 mg by mouth daily.      [name] b complex vitamins tablet Take 1 tablet by mouth daily.      . carvedilol (COREG) 6.25 MG tablet Take 1 tablet (6.25 mg total) by mouth 2 (two) times daily with a meal. 180 tablet 1  . cephALEXin (KEFLEX) 500 MG capsule Take 4 capsules (2,000 mg total) by mouth daily. Take 4 capsules by mouth one hour before dental appointment 4 capsule 0  . Cholecalciferol 1000 UNITS tablet Take 1,000 Units by mouth daily.      . clopidogrel (PLAVIX) 75 MG tablet Take 1 tablet (75 mg total) by mouth daily. 30 tablet 2  . erythromycin ophthalmic ointment Place 1 application into both eyes at bedtime. 3.5 g 0  . finasteride (PROSCAR) 5 MG tablet Take 1 tablet (5 mg total) by mouth daily. 90 tablet 1  . Glucosamine-Chondroit-Vit C-Mn (GLUCOSAMINE 1500 COMPLEX PO) Take 1 tablet by mouth 2 (two) times daily.     [name] lisinopril (PRINIVIL,ZESTRIL) 10 MG tablet Take 1 tablet (10 mg total) by mouth 2 (two) times daily. 60 tablet 11  . meclizine (ANTIVERT) 12.5 MG tablet Take 1 tablet (12.5 mg total) by mouth 3 (three) times daily as needed for dizziness. 60 tablet 1  . nitroGLYCERIN (NITROSTAT) 0.4 MG SL tablet Place 1 tablet (0.4 mg total) under the tongue every 5 (five) minutes as needed for chest pain. 25 tablet 2  . pantoprazole (PROTONIX) 40 MG tablet Take 1 tablet (40 mg total) by mouth daily. 30 tablet 2  . polyethylene glycol (MIRALAX / GLYCOLAX) packet Take 17 g by mouth daily.    . simvastatin (ZOCOR) 80 MG tablet Take 1 tablet (80 mg total) by mouth at bedtime. 30 tablet 11  . vitamin C (ASCORBIC ACID) 500 MG tablet Take 500 mg by mouth daily.     No facility-administered  medications prior to visit.     ROS Review of Systems  Constitutional: Negative for appetite change, fatigue and unexpected weight change.  HENT: Negative for congestion, nosebleeds, sneezing, sore throat and trouble swallowing.   Eyes: Negative for itching and visual disturbance.  Respiratory: Negative for cough.   Cardiovascular: Negative for chest pain, palpitations and leg swelling.  Gastrointestinal: Negative for abdominal distention, blood in stool, diarrhea and nausea.  Genitourinary: Negative for frequency and hematuria.  Musculoskeletal: Negative for back pain, gait problem, joint swelling and neck pain.  Skin: Negative for rash.  Neurological: Negative for dizziness, tremors, speech difficulty and weakness.  Psychiatric/Behavioral: Negative for agitation, dysphoric mood and sleep disturbance. The patient is not nervous/anxious.     Objective:  BP 128/76 (BP Location: Left Arm, Patient Position: Sitting, Cuff Size: Large)   Pulse (!) 51   Temp 97.8 F (36.6 C) (Oral)   Ht 5\' 5"  (1.651 m)   Wt 156 lb (70.8 kg)   SpO2 99%   BMI 25.96 kg/m   BP Readings from Last 3 Encounters:  03/01/17 128/76  11/04/16 128/60  11/03/16 118/66    Wt Readings from Last 3 Encounters:  03/01/17 156 lb (70.8 kg)  11/04/16 154 lb 6.4 oz (70 kg)  11/03/16 155 lb (70.3 kg)    Physical Exam  Constitutional: He is oriented to person, place, and time. He appears well-developed. No distress.  NAD  HENT:  Mouth/Throat: Oropharynx is clear and moist.  Eyes: Conjunctivae are normal. Pupils are equal, round, and reactive to light.  Neck: Normal range of motion. No JVD present. No thyromegaly present.  Cardiovascular: Normal rate, regular rhythm, normal heart sounds and intact distal pulses. Exam reveals no gallop and no friction rub.  No murmur heard. Pulmonary/Chest: Effort normal and breath sounds normal. No respiratory distress. He has no wheezes. He has no rales. He exhibits no  tenderness.  Abdominal: Soft. Bowel sounds are normal. He exhibits no distension and no mass. There is no tenderness. There is no rebound and no guarding.  Musculoskeletal: Normal range of motion. He exhibits no edema or tenderness.  Lymphadenopathy:    He has no cervical adenopathy.  Neurological: He is alert and oriented to person, place, and time. He has normal reflexes. No cranial nerve deficit. He exhibits normal muscle tone. He displays a negative Romberg sign. Coordination and gait normal.  Skin: Skin is warm and dry. No rash noted.  Psychiatric: He has a normal mood and affect. His behavior is normal. Judgment and thought content normal.    Lab Results  Component Value Date   WBC 7.1 05/03/2016   HGB 13.9 05/03/2016   HCT 41.3 05/03/2016   PLT 190.0 05/03/2016   GLUCOSE 123 (H) 11/03/2016   CHOL 149 07/28/2015   TRIG 65.0 07/28/2015   HDL 74.10 07/28/2015   LDLCALC 62 07/28/2015   ALT 14 05/03/2016   AST 22 05/03/2016   NA 138 11/03/2016   K 4.1 11/03/2016   CL 101 11/03/2016   CREATININE 1.12 11/03/2016   BUN 20 11/03/2016   CO2 30 11/03/2016   TSH 3.02 05/03/2016   PSA 6.45 (H) 08/07/2013   HGBA1C 6.4 12/05/2012    No results found.  Assessment & Plan:   There are no diagnoses linked to this encounter. I am having Cecilie Lowers maintain his aspirin, b complex vitamins, Glucosamine-Chondroit-Vit C-Mn (GLUCOSAMINE 1500 COMPLEX PO), Cholecalciferol, polyethylene glycol, vitamin C, nitroGLYCERIN, cephALEXin, erythromycin, meclizine, finasteride, lisinopril, simvastatin, carvedilol, clopidogrel, and pantoprazole.  No orders of the defined types were placed in this encounter.    Follow-up: No Follow-up on file.  Walker Kehr, MD

## 2017-04-22 ENCOUNTER — Observation Stay (HOSPITAL_COMMUNITY): Payer: Medicare Other

## 2017-04-22 ENCOUNTER — Emergency Department (HOSPITAL_COMMUNITY): Payer: Medicare Other

## 2017-04-22 ENCOUNTER — Encounter (HOSPITAL_COMMUNITY): Payer: Self-pay | Admitting: Emergency Medicine

## 2017-04-22 ENCOUNTER — Observation Stay (HOSPITAL_BASED_OUTPATIENT_CLINIC_OR_DEPARTMENT_OTHER): Payer: Medicare Other

## 2017-04-22 ENCOUNTER — Other Ambulatory Visit: Payer: Self-pay

## 2017-04-22 ENCOUNTER — Observation Stay (HOSPITAL_COMMUNITY)
Admission: EM | Admit: 2017-04-22 | Discharge: 2017-04-23 | Disposition: A | Discharge status: Home / Self Care | Payer: Medicare Other | Attending: Family Medicine | Admitting: Family Medicine

## 2017-04-22 ENCOUNTER — Other Ambulatory Visit (HOSPITAL_COMMUNITY): Payer: Medicare Other

## 2017-04-22 DIAGNOSIS — Z955 Presence of coronary angioplasty implant and graft: Secondary | ICD-10-CM | POA: Insufficient documentation

## 2017-04-22 DIAGNOSIS — I252 Old myocardial infarction: Secondary | ICD-10-CM | POA: Insufficient documentation

## 2017-04-22 DIAGNOSIS — I10 Essential (primary) hypertension: Secondary | ICD-10-CM | POA: Diagnosis not present

## 2017-04-22 DIAGNOSIS — R0602 Shortness of breath: Secondary | ICD-10-CM | POA: Diagnosis not present

## 2017-04-22 DIAGNOSIS — R0789 Other chest pain: Secondary | ICD-10-CM | POA: Diagnosis not present

## 2017-04-22 DIAGNOSIS — I2510 Atherosclerotic heart disease of native coronary artery without angina pectoris: Secondary | ICD-10-CM | POA: Diagnosis not present

## 2017-04-22 DIAGNOSIS — Z7901 Long term (current) use of anticoagulants: Secondary | ICD-10-CM | POA: Insufficient documentation

## 2017-04-22 DIAGNOSIS — Y939 Activity, unspecified: Secondary | ICD-10-CM | POA: Insufficient documentation

## 2017-04-22 DIAGNOSIS — I340 Nonrheumatic mitral (valve) insufficiency: Secondary | ICD-10-CM

## 2017-04-22 DIAGNOSIS — Z7982 Long term (current) use of aspirin: Secondary | ICD-10-CM | POA: Insufficient documentation

## 2017-04-22 DIAGNOSIS — S79912A Unspecified injury of left hip, initial encounter: Secondary | ICD-10-CM | POA: Diagnosis not present

## 2017-04-22 DIAGNOSIS — R079 Chest pain, unspecified: Secondary | ICD-10-CM | POA: Diagnosis not present

## 2017-04-22 DIAGNOSIS — N400 Enlarged prostate without lower urinary tract symptoms: Secondary | ICD-10-CM | POA: Diagnosis not present

## 2017-04-22 DIAGNOSIS — Z96641 Presence of right artificial hip joint: Secondary | ICD-10-CM | POA: Insufficient documentation

## 2017-04-22 DIAGNOSIS — S4992XA Unspecified injury of left shoulder and upper arm, initial encounter: Secondary | ICD-10-CM | POA: Diagnosis not present

## 2017-04-22 DIAGNOSIS — M25512 Pain in left shoulder: Secondary | ICD-10-CM | POA: Diagnosis not present

## 2017-04-22 DIAGNOSIS — Z85828 Personal history of other malignant neoplasm of skin: Secondary | ICD-10-CM | POA: Insufficient documentation

## 2017-04-22 DIAGNOSIS — W19XXXA Unspecified fall, initial encounter: Secondary | ICD-10-CM | POA: Diagnosis present

## 2017-04-22 DIAGNOSIS — I700 Atherosclerosis of aorta: Secondary | ICD-10-CM | POA: Diagnosis not present

## 2017-04-22 DIAGNOSIS — M25552 Pain in left hip: Secondary | ICD-10-CM | POA: Diagnosis not present

## 2017-04-22 DIAGNOSIS — E785 Hyperlipidemia, unspecified: Secondary | ICD-10-CM | POA: Diagnosis not present

## 2017-04-22 DIAGNOSIS — Z79899 Other long term (current) drug therapy: Secondary | ICD-10-CM | POA: Diagnosis not present

## 2017-04-22 DIAGNOSIS — I259 Chronic ischemic heart disease, unspecified: Secondary | ICD-10-CM

## 2017-04-22 HISTORY — DX: Unspecified malignant neoplasm of skin, unspecified: C44.90

## 2017-04-22 LAB — CBC
HCT: 42.5 % (ref 39.0–52.0)
Hemoglobin: 14.2 g/dL (ref 13.0–17.0)
MCH: 31.3 pg (ref 26.0–34.0)
MCHC: 33.4 g/dL (ref 30.0–36.0)
MCV: 93.6 fL (ref 78.0–100.0)
Platelets: 201 10*3/uL (ref 150–400)
RBC: 4.54 MIL/uL (ref 4.22–5.81)
RDW: 13.8 % (ref 11.5–15.5)
WBC: 8.6 10*3/uL (ref 4.0–10.5)

## 2017-04-22 LAB — BASIC METABOLIC PANEL
Anion gap: 11 (ref 5–15)
BUN: 11 mg/dL (ref 6–20)
CO2: 22 mmol/L (ref 22–32)
Calcium: 9.1 mg/dL (ref 8.9–10.3)
Chloride: 104 mmol/L (ref 101–111)
Creatinine, Ser: 1.11 mg/dL (ref 0.61–1.24)
GFR calc Af Amer: >60 mL/min (ref >60)
GFR calc non Af Amer: 56 mL/min — ABNORMAL LOW (ref >60)
Glucose, Bld: 121 mg/dL — ABNORMAL HIGH (ref 65–99)
Potassium: 4.6 mmol/L (ref 3.5–5.1)
Sodium: 137 mmol/L (ref 135–145)

## 2017-04-22 LAB — LIPID PANEL
Cholesterol: 159 mg/dL (ref 0–200)
HDL: 72 mg/dL (ref >40)
LDL Cholesterol: 73 mg/dL (ref 0–99)
Total CHOL/HDL Ratio: 2.2 RATIO
Triglycerides: 68 mg/dL (ref <150)
VLDL: 14 mg/dL (ref 0–40)

## 2017-04-22 LAB — ECHOCARDIOGRAM COMPLETE: Weight: 2496 oz

## 2017-04-22 LAB — I-STAT TROPONIN, ED: Troponin i, poc: 0.01 ng/mL (ref 0.00–0.08)

## 2017-04-22 LAB — D-DIMER, QUANTITATIVE: D-Dimer, Quant: 0.78 ug/mL-FEU — ABNORMAL HIGH (ref 0.00–0.50)

## 2017-04-22 LAB — TROPONIN I
Troponin I: <0.03 ng/mL (ref <0.03)
Troponin I: 0.03 ng/mL (ref <0.03)
Troponin I: 0.04 ng/mL (ref <0.03)

## 2017-04-22 MED ORDER — GI COCKTAIL ~~LOC~~
30.0000 mL | Freq: Four times a day (QID) | ORAL | Status: DC | PRN
Start: 2017-04-22 — End: 2017-04-23

## 2017-04-22 MED ORDER — ATORVASTATIN CALCIUM 40 MG PO TABS
40.0000 mg | ORAL_TABLET | Freq: Every day | ORAL | Status: DC
  Administered 2017-04-22 (×2): 40 mg via ORAL
  Filled 2017-04-22 (×3): qty 1

## 2017-04-22 MED ORDER — MORPHINE SULFATE (PF) 4 MG/ML IV SOLN
2.0000 mg | INTRAVENOUS | Status: DC | PRN
Start: 2017-04-22 — End: 2017-04-23

## 2017-04-22 MED ORDER — LISINOPRIL 10 MG PO TABS
10.0000 mg | ORAL_TABLET | Freq: Two times a day (BID) | ORAL | Status: DC
  Administered 2017-04-22 – 2017-04-23 (×3): 10 mg via ORAL
  Filled 2017-04-22 (×3): qty 1

## 2017-04-22 MED ORDER — ONDANSETRON HCL 4 MG/2ML IJ SOLN: 4.0000 mg | Freq: Four times a day (QID) | INTRAMUSCULAR | Status: DC | PRN

## 2017-04-22 MED ORDER — PANTOPRAZOLE SODIUM 40 MG PO TBEC
40.0000 mg | DELAYED_RELEASE_TABLET | Freq: Every day | ORAL | Status: DC
  Administered 2017-04-22 – 2017-04-23 (×2): 40 mg via ORAL
  Filled 2017-04-22 (×2): qty 1

## 2017-04-22 MED ORDER — IOPAMIDOL (ISOVUE-370) INJECTION 76%
INTRAVENOUS | Status: AC
  Administered 2017-04-22: 100 mL
  Filled 2017-04-22: qty 100

## 2017-04-22 MED ORDER — ERYTHROMYCIN 5 MG/GM OP OINT
1.0000 "application " | TOPICAL_OINTMENT | Freq: Every day | OPHTHALMIC | Status: DC
  Administered 2017-04-22: 1 via OPHTHALMIC
  Filled 2017-04-22: qty 3.5

## 2017-04-22 MED ORDER — ACETAMINOPHEN 325 MG PO TABS
650.0000 mg | ORAL_TABLET | ORAL | Status: DC | PRN
  Administered 2017-04-22: 650 mg via ORAL
  Filled 2017-04-22: qty 2

## 2017-04-22 MED ORDER — CARVEDILOL 6.25 MG PO TABS
6.2500 mg | ORAL_TABLET | Freq: Two times a day (BID) | ORAL | Status: DC
  Administered 2017-04-22 – 2017-04-23 (×2): 6.25 mg via ORAL
  Filled 2017-04-22 (×2): qty 1

## 2017-04-22 MED ORDER — ASPIRIN EC 81 MG PO TBEC
81.0000 mg | DELAYED_RELEASE_TABLET | Freq: Every day | ORAL | Status: DC
  Administered 2017-04-23: 81 mg via ORAL
  Filled 2017-04-22 (×2): qty 1

## 2017-04-22 MED ORDER — POLYETHYLENE GLYCOL 3350 17 G PO PACK
17.0000 g | PACK | Freq: Every day | ORAL | Status: DC
  Administered 2017-04-22: 17 g via ORAL
  Filled 2017-04-22 (×2): qty 1

## 2017-04-22 MED ORDER — VITAMIN D 1000 UNITS PO TABS
1000.0000 [IU] | ORAL_TABLET | Freq: Every day | ORAL | Status: DC
  Administered 2017-04-22 – 2017-04-23 (×2): 1000 [IU] via ORAL
  Filled 2017-04-22 (×4): qty 1

## 2017-04-22 MED ORDER — MECLIZINE HCL 25 MG PO TABS: 12.5000 mg | ORAL_TABLET | Freq: Three times a day (TID) | ORAL | Status: DC | PRN

## 2017-04-22 MED ORDER — HYDRALAZINE HCL 20 MG/ML IJ SOLN: 5.0000 mg | INTRAMUSCULAR | Status: DC | PRN

## 2017-04-22 MED ORDER — FINASTERIDE 5 MG PO TABS
5.0000 mg | ORAL_TABLET | Freq: Every day | ORAL | Status: DC
  Administered 2017-04-22 – 2017-04-23 (×2): 5 mg via ORAL
  Filled 2017-04-22 (×2): qty 1

## 2017-04-22 MED ORDER — ASPIRIN 325 MG PO TABS
325.0000 mg | ORAL_TABLET | Freq: Once | ORAL | Status: AC
  Administered 2017-04-22: 325 mg via ORAL
  Filled 2017-04-22: qty 1

## 2017-04-22 MED ORDER — CLOPIDOGREL BISULFATE 75 MG PO TABS: 75.0000 mg | ORAL_TABLET | Freq: Every day | ORAL | Status: DC

## 2017-04-22 MED ORDER — LORAZEPAM 2 MG/ML IJ SOLN: 0.5000 mg | Freq: Two times a day (BID) | INTRAMUSCULAR | Status: DC | PRN

## 2017-04-22 MED ORDER — NITROGLYCERIN 0.4 MG SL SUBL: 0.4000 mg | SUBLINGUAL_TABLET | SUBLINGUAL | Status: DC | PRN

## 2017-04-22 MED ORDER — ENOXAPARIN SODIUM 40 MG/0.4ML ~~LOC~~ SOLN
40.0000 mg | SUBCUTANEOUS | Status: DC
  Administered 2017-04-22: 40 mg via SUBCUTANEOUS
  Filled 2017-04-22: qty 0.4

## 2017-04-22 MED ORDER — VITAMIN C 500 MG PO TABS
500.0000 mg | ORAL_TABLET | Freq: Every day | ORAL | Status: DC
  Administered 2017-04-22 – 2017-04-23 (×2): 500 mg via ORAL
  Filled 2017-04-22 (×2): qty 1

## 2017-04-22 NOTE — ED Triage Notes (Addendum)
Pt to ED via [facility] from [facility]-- pt had been ambulating to breakfast-- and developed chest pain, pt states that "Pain was so bad that I fell down, on my left shoulder" -- pt is tearful - states wife has just broken leg and is unable to walk,  On arrival pt is pain free-- resp unlabored.  Received ASA 324mg  enroute States pain was "so severe that I felt like I was being punched x 2"
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
## 2017-04-22 NOTE — Progress Notes (Signed)
  Echocardiogram 2D Echocardiogram has been performed.  [name] 04/22/2017, 2:21 PM

## 2017-04-22 NOTE — H&P (Signed)
History and Physical    [name]:[id number] DOB: 06/26/1923 DOA: 04/22/2017  PCP: Cassandria Anger, MD Patient coming from: home  Chief Complaint: chest pain  HPI: [name] is a delightful 84 y.o. 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 Initial evaluation concerning for ACS. [facility] are asked to admit  Information is obtained from the patient and his wife who is at the bedside. He states he was his usual state of health when he was walking to breakfast and he developed sudden left anterior chest pain. He describes the pain like "someone was punching me". Initially he lost his breath and the pain was so severe he fell down. He denies headache dizziness syncope or near-syncope. He states the pain was nonradiating and lasted no more than a minute. He denies diaphoresis nausea vomiting. He denies lower extremity edema or orthopnea.He denies numbness tingling of his extremities or difficulty speaking chewing or swallowing. He reports EMS was called. He reports that he fell he fell on his left hip and left shoulder. He denies dysuria hematuria frequency or urgency. He denies abdominal pain diarrhea constipation melena or bright red blood per rectum. Patient was provided with 325 mg aspirin but he reports he did not take it.   ED Course: in the emergency department he's afebrile hemodynamically stable with a blood pressure on the high end of normal. He is not hypoxic.  Review of Systems: As per HPI otherwise all other systems reviewed and are negative.   Ambulatory Status: ambulates independently. Resides at the [facility] home in the assisted living section  Past Medical History:  Diagnosis Date  . Coronary atherosclerosis of unspecified type of vessel, native or graft   . Dizziness and giddiness   . Hypertrophy of prostate without urinary  obstruction and other lower urinary tract symptoms (LUTS)   . MI (myocardial infarction) ([place])   . Osteoarthrosis, unspecified whether generalized or localized, unspecified site   . Other and unspecified hyperlipidemia   . Shortness of breath dyspnea    with exertion  . Thrombocytopenia, unspecified ([place])   . Unspecified essential hypertension     Past Surgical History:  Procedure Laterality Date  . COLONOSCOPY    . CORONARY ANGIOPLASTY  01/07/2001, 2008   2 stents  . ESOPHAGOGASTRODUODENOSCOPY N/A 04/22/2014   Procedure: ESOPHAGOGASTRODUODENOSCOPY (EGD);  Surgeon: Milus Banister, MD;  Location: [facility];  Service: Endoscopy;  Laterality: N/A;  . HIP ARTHROPLASTY Right 2006  . TRANSURETHRAL RESECTION OF PROSTATE      Social History   Socioeconomic History  . Marital status: Married    Spouse name: [name]  . Number of children: 2  . Years of education: Not on file  . Highest education level: Not on file  Occupational History  . Occupation: Retired    Fish farm manager: RETIRED  Social Needs  . Financial resource strain: Not on file  . Food insecurity:    Worry: Not on file    Inability: Not on file  . Transportation needs:    Medical: Not on file    Non-medical: Not on file  Tobacco Use  . Smoking status: Never Smoker  . Smokeless tobacco: Never Used  Substance and Sexual Activity  . Alcohol use: No    Alcohol/week: 0.0 oz  . Drug use: No  . Sexual activity: Not Currently  Lifestyle  . Physical activity:    Days per  week: Not on file    Minutes per session: Not on file  . Stress: Not on file  Relationships  . Social connections:    Talks on phone: Not on file    Gets together: Not on file    Attends religious service: Not on file    Active member of club or organization: Not on file    Attends meetings of clubs or organizations: Not on file    Relationship status: Not on file  . Intimate partner violence:    Fear of current or ex partner: Not on file     Emotionally abused: Not on file    Physically abused: Not on file    Forced sexual activity: Not on file  Other Topics Concern  . Not on file  Social History Narrative   Regular Exercise -  YES          Allergies  Allergen Reactions  . Oxycodone Nausea And Vomiting  . Penicillins Hives    Family History  Problem Relation Age of Onset  . Heart disease Father   . Coronary artery disease Other   . Colon cancer Neg Hx     Prior to Admission medications   Medication Sig Start Date End Date Taking? Authorizing Provider  aspirin 81 MG EC tablet Take 81 mg by mouth daily.      [provider]  b complex vitamins tablet Take 1 tablet by mouth daily.      [provider]  carvedilol (COREG) 6.25 MG tablet Take 1 tablet (6.25 mg total) by mouth 2 (two) times daily with a meal. 02/10/17   Plotnikov, Evie Lacks, MD  cephALEXin (KEFLEX) 500 MG capsule Take 4 capsules (2,000 mg total) by mouth daily. Take 4 capsules by mouth one hour before dental appointment 10/21/15   Plotnikov, Evie Lacks, MD  Cholecalciferol 1000 UNITS tablet Take 1,000 Units by mouth daily.      [provider]  clopidogrel (PLAVIX) 75 MG tablet Take 1 tablet (75 mg total) by mouth daily. 02/24/17   Plotnikov, Evie Lacks, MD  erythromycin ophthalmic ointment Place 1 application into both eyes at bedtime. 05/03/16   Plotnikov, Evie Lacks, MD  finasteride (PROSCAR) 5 MG tablet Take 1 tablet (5 mg total) by mouth daily. 11/04/16   Plotnikov, Evie Lacks, MD  Glucosamine-Chondroit-Vit C-Mn (GLUCOSAMINE 1500 COMPLEX PO) Take 1 tablet by mouth 2 (two) times daily.     [provider]  lisinopril (PRINIVIL,ZESTRIL) 10 MG tablet Take 1 tablet (10 mg total) by mouth 2 (two) times daily. 11/22/16   Plotnikov, Evie Lacks, MD  meclizine (ANTIVERT) 12.5 MG tablet Take 1 tablet (12.5 mg total) by mouth 3 (three) times daily as needed for dizziness. 10/12/16 10/12/17  Plotnikov, Evie Lacks, MD  nitroGLYCERIN  (NITROSTAT) 0.4 MG SL tablet Place 1 tablet (0.4 mg total) under the tongue every 5 (five) minutes as needed for chest pain. 07/28/15   Plotnikov, Evie Lacks, MD  pantoprazole (PROTONIX) 40 MG tablet Take 1 tablet (40 mg total) by mouth daily. 02/24/17   Plotnikov, Evie Lacks, MD  polyethylene glycol (MIRALAX / GLYCOLAX) packet Take 17 g by mouth daily.    [provider]  simvastatin (ZOCOR) 80 MG tablet Take 1 tablet (80 mg total) by mouth at bedtime. 11/29/16   Plotnikov, Evie Lacks, MD  vitamin C (ASCORBIC ACID) 500 MG tablet Take 500 mg by mouth daily.    [provider]    Physical Exam: Vitals:  04/22/17 0853 04/22/17 0906  BP: (!) 186/84   Pulse: (!) 58   Resp: (!) 24   Temp: 97.8 F (36.6 C)   TempSrc: Axillary   SpO2: 100%   Weight:  70.8 kg (156 lb)     General:  Appears slightly anxious but in no acute distress Eyes:  PERRL, EOMI, normal lids, iris ENT:  grossly normal hearing, lips & tongue, ucous membranes of his mouth are pink but slightly dry Neck:  no LAD, masses or thyromegaly Cardiovascular:  RRR, no m/r/g. No LE edema.  Respiratory:  CTA bilaterally, no w/r/r. Normal respiratory effort. Abdomen:  soft, ntnd, NABS Skin:  no rash or induration seen on limited exam Musculoskeletal:  grossly normal tone BUE/BLE, good ROM, no bony abnormality Psychiatric:  grossly normal mood and affect, speech fluent and appropriate, AOx3 Neurologic:  CN 2-12 grossly intact, moves all extremities in coordinated fashion, sensation intact oriented 3 speech clear moves all extremities  Labs on Admission: I have personally reviewed following labs and imaging studies  CBC: Recent Labs  Lab 04/22/17 0900  WBC 8.6  HGB 14.2  HCT 42.5  MCV 93.6  PLT 338   Basic Metabolic Panel: Recent Labs  Lab 04/22/17 0900  NA 137  K 4.6  CL 104  CO2 22  GLUCOSE 121*  BUN 11  CREATININE 1.11  CALCIUM 9.1   GFR: Estimated Creatinine Clearance: 37.7 mL/min (by C-G  formula based on SCr of 1.11 mg/dL). Liver Function Tests: No results for input(s): AST, ALT, ALKPHOS, BILITOT, PROT, ALBUMIN in the last 168 hours. No results for input(s): LIPASE, AMYLASE in the last 168 hours. No results for input(s): AMMONIA in the last 168 hours. Coagulation Profile: No results for input(s): INR, PROTIME in the last 168 hours. Cardiac Enzymes: Recent Labs  Lab 04/22/17 0900  TROPONINI <0.03   BNP (last 3 results) No results for input(s): PROBNP in the last 8760 hours. HbA1C: No results for input(s): HGBA1C in the last 72 hours. CBG: No results for input(s): GLUCAP in the last 168 hours. Lipid Profile: No results for input(s): CHOL, HDL, LDLCALC, TRIG, CHOLHDL, LDLDIRECT in the last 72 hours. Thyroid Function Tests: No results for input(s): TSH, T4TOTAL, FREET4, T3FREE, THYROIDAB in the last 72 hours. Anemia Panel: No results for input(s): VITAMINB12, FOLATE, FERRITIN, TIBC, IRON, RETICCTPCT in the last 72 hours. Urine analysis:    Component Value Date/Time   COLORURINE YELLOW 08/07/2013 1444   APPEARANCEUR CLEAR 08/07/2013 1444   LABSPEC 1.025 08/07/2013 1444   PHURINE 5.5 08/07/2013 1444   GLUCOSEU NEGATIVE 08/07/2013 1444   HGBUR NEGATIVE 08/07/2013 1444   BILIRUBINUR NEGATIVE 08/07/2013 1444   KETONESUR TRACE (A) 08/07/2013 1444   PROTEINUR NEGATIVE 06/12/2013 1846   UROBILINOGEN 0.2 08/07/2013 1444   NITRITE NEGATIVE 08/07/2013 1444   LEUKOCYTESUR NEGATIVE 08/07/2013 1444    Creatinine Clearance: Estimated Creatinine Clearance: 37.7 mL/min (by C-G formula based on SCr of 1.11 mg/dL).  Sepsis Labs: @LABRCNTIP (procalcitonin:4,lacticidven:4) )No results found for this or any previous visit (from the past 240 hour(s)).   Radiological Exams on Admission: Dg Chest 2 View  Result Date: 04/22/2017 CLINICAL DATA:  Severe chest pain this morning EXAM: CHEST - 2 VIEW COMPARISON:  06/27/2012 FINDINGS: Cardiac shadow is stable. Aortic calcifications  are again seen. The lungs are well aerated bilaterally. Degenerative changes of the thoracic spine are noted. IMPRESSION: No active cardiopulmonary disease. Electronically Signed   By: Inez Catalina M.D.   On: 04/22/2017 09:50   Dg  Shoulder Left  Result Date: 04/22/2017 CLINICAL DATA:  Recent fall with left shoulder pain, initial encounter EXAM: LEFT SHOULDER - 2+ VIEW COMPARISON:  None. FINDINGS: Mild degenerative changes of the acromioclavicular joint are seen. No acute fracture or dislocation is noted. The underlying bony thorax is unremarkable. IMPRESSION: Mild degenerative change without acute abnormality. Electronically Signed   By: Inez Catalina M.D.   On: 04/22/2017 09:50   Dg Hip Unilat W Or Wo Pelvis 2-3 Views Left  Result Date: 04/22/2017 CLINICAL DATA:  Recent fall with left hip pain, initial encounter EXAM: DG HIP (WITH OR WITHOUT PELVIS) 3V LEFT COMPARISON:  None. FINDINGS: Right hip replacement is noted. Pelvic ring is intact. Mild degenerative changes of the left hip joint are seen. No acute fracture or dislocation is noted. Diffuse vascular calcifications are seen. IMPRESSION: No acute abnormality noted. Electronically Signed   By: Inez Catalina M.D.   On: 04/22/2017 09:51    EKG: Sinus rhythm Probable left atrial enlargement LAD, consider left anterior fascicular block RSR' in V1 or V2, probably normal variant  Assessment/Plan Principal Problem:   Chest pain Active Problems:   Dyslipidemia   Essential hypertension   Coronary atherosclerosis   BPH (benign prostatic hyperplasia)   [place]   #1. Chest pain. Some typical and atypical features. Heart score is 5. History of CAD with inferior MI in 2002 status post stent and 2007 spurious to stable anginaand placement of drug-eluting stent. Chart review indicates no skin testing since 2012. Chest pain quickly resolved. Initial troponin negative. EKG with new T-wave inversions in V6 otherwise no acute changes. Chest x-ray that acute  abnormalities. Patient states he was given aspirin but did not take it prior to arrival. Cardiology consult requested per emergency department -Admit to telemetry -Cycle troponin -serial EKG -Obtain a lipid panel -Obtain an EKG -Continue aspirin and statin -hold plavix for now -GI cocktail -Monitor  #2.hypertension. Lopressor high end of normal in the emergency department. Home medications include Coreg, lisinopril -continue home meds for now -Monitor  #3. [place]. Secondary to pain. Denies LOC. Xray to left hip and left shoulder without acute abnormality.  -supportive therapy -physical therapy  #4. BPH. -Continue home meds  #5. Dyslipidemia -Lipid panel -Continue statin    DVT prophylaxis: lovenox  Code Status: full  Family Communication: wife  Disposition Plan: back to facility  Consults called: cardmaster  Admission status: obs   Radene Gunning MD [facility]  If 7PM-7AM, please contact night-coverage [email or web address] Password [facility]  04/22/2017, 11:24 AM

## 2017-04-23 DIAGNOSIS — E785 Hyperlipidemia, unspecified: Secondary | ICD-10-CM

## 2017-04-23 DIAGNOSIS — R079 Chest pain, unspecified: Secondary | ICD-10-CM | POA: Diagnosis not present

## 2017-04-23 DIAGNOSIS — Z955 Presence of coronary angioplasty implant and graft: Secondary | ICD-10-CM | POA: Diagnosis not present

## 2017-04-23 DIAGNOSIS — I255 Ischemic cardiomyopathy: Secondary | ICD-10-CM | POA: Diagnosis not present

## 2017-04-23 DIAGNOSIS — Z85828 Personal history of other malignant neoplasm of skin: Secondary | ICD-10-CM | POA: Diagnosis not present

## 2017-04-23 DIAGNOSIS — I25119 Atherosclerotic heart disease of native coronary artery with unspecified angina pectoris: Secondary | ICD-10-CM

## 2017-04-23 DIAGNOSIS — Z7982 Long term (current) use of aspirin: Secondary | ICD-10-CM | POA: Diagnosis not present

## 2017-04-23 DIAGNOSIS — Z7901 Long term (current) use of anticoagulants: Secondary | ICD-10-CM | POA: Diagnosis not present

## 2017-04-23 DIAGNOSIS — I10 Essential (primary) hypertension: Secondary | ICD-10-CM | POA: Diagnosis not present

## 2017-04-23 DIAGNOSIS — R0789 Other chest pain: Secondary | ICD-10-CM

## 2017-04-23 DIAGNOSIS — N400 Enlarged prostate without lower urinary tract symptoms: Secondary | ICD-10-CM

## 2017-04-23 DIAGNOSIS — I429 Cardiomyopathy, unspecified: Secondary | ICD-10-CM | POA: Diagnosis not present

## 2017-04-23 DIAGNOSIS — I2510 Atherosclerotic heart disease of native coronary artery without angina pectoris: Secondary | ICD-10-CM | POA: Diagnosis not present

## 2017-04-23 DIAGNOSIS — I252 Old myocardial infarction: Secondary | ICD-10-CM | POA: Diagnosis not present

## 2017-04-23 DIAGNOSIS — Z96641 Presence of right artificial hip joint: Secondary | ICD-10-CM | POA: Diagnosis not present

## 2017-04-23 DIAGNOSIS — Z79899 Other long term (current) drug therapy: Secondary | ICD-10-CM | POA: Diagnosis not present

## 2017-04-23 LAB — TROPONIN I: Troponin I: 0.04 ng/mL (ref <0.03)

## 2017-04-23 MED ORDER — ATORVASTATIN CALCIUM 40 MG PO TABS: 40.0000 mg | ORAL_TABLET | Freq: Every day | ORAL | 0 refills | Status: DC

## 2017-04-23 MED ORDER — NITROGLYCERIN 0.4 MG SL SUBL: 0.4000 mg | SUBLINGUAL_TABLET | SUBLINGUAL | 0 refills | Status: DC | PRN

## 2017-04-23 NOTE — Progress Notes (Signed)
Discharge instructions received and discussed with pt and his wife via telephone. Discharge pkt sent with pt via [facility] with prescriptions. F/U instructions discussed. Verbalized understanding. Discharged to [facility] via [place] at this time.

## 2017-04-23 NOTE — Discharge Instructions (Signed)
Nonspecific Chest Pain °Chest pain can be caused by many different conditions. There is always a chance that your pain could be related to something serious, such as a heart attack or a blood clot in your lungs. Chest pain can also be caused by conditions that are not life-threatening. If you have chest pain, it is very important to follow up with your health care provider. °What are the causes? °Causes of this condition include: °· Heartburn. °· Pneumonia or bronchitis. °· Anxiety or stress. °· Inflammation around your heart (pericarditis) or lung (pleuritis or pleurisy). °· A blood clot in your lung. °· A collapsed lung (pneumothorax). This can develop suddenly on its own (spontaneous pneumothorax) or from trauma to the chest. °· Shingles infection (varicella-zoster virus). °· Heart attack. °· Damage to the bones, muscles, and cartilage that make up your chest wall. This can include: °? Bruised bones due to injury. °? Strained muscles or cartilage due to frequent or repeated coughing or overwork. °? Fracture to one or more ribs. °? Sore cartilage due to inflammation (costochondritis). ° °What increases the risk? °Risk factors for this condition may include: °· Activities that increase your risk for trauma or injury to your chest. °· Respiratory infections or conditions that cause frequent coughing. °· Medical conditions or overeating that can cause heartburn. °· Heart disease or family history of heart disease. °· Conditions or health behaviors that increase your risk of developing a blood clot. °· Having had chicken pox (varicella zoster). ° °What are the signs or symptoms? °Chest pain can feel like: °· Burning or tingling on the surface of your chest or deep in your chest. °· Crushing, pressure, aching, or squeezing pain. °· Dull or sharp pain that is worse when you move, cough, or take a deep breath. °· Pain that is also felt in your back, neck, shoulder, or arm, or pain that spreads to any of these  areas. ° °Your chest pain may come and go, or it may stay constant. °How is this diagnosed? °Lab tests or other studies may be needed to find the cause of your pain. Your health care provider may have you take a test called an ECG (electrocardiogram). An ECG records your heartbeat patterns at the time the test is performed. You may also have other tests, such as: °· Transthoracic echocardiogram (TTE). In this test, sound waves are used to create a picture of the heart structures and to look at how blood flows through your heart. °· Transesophageal echocardiogram (TEE). This is a more advanced imaging test that takes images from inside your body. It allows your health care provider to see your heart in finer detail. °· Cardiac monitoring. This allows your health care provider to monitor your heart rate and rhythm in real time. °· Holter monitor. This is a portable device that records your heartbeat and can help to diagnose abnormal heartbeats. It allows your health care provider to track your heart activity for several days, if needed. °· Stress tests. These can be done through exercise or by taking medicine that makes your heart beat more quickly. °· Blood tests. °· Other imaging tests. ° °How is this treated? °Treatment depends on what is causing your chest pain. Treatment may include: °· Medicines. These may include: °? Acid blockers for heartburn. °? Anti-inflammatory medicine. °? Pain medicine for inflammatory conditions. °? Antibiotic medicine, if an infection is present. °? Medicines to dissolve blood clots. °? Medicines to treat coronary artery disease (CAD). °· Supportive care for conditions that   do not require medicines. This may include: °? Resting. °? Applying heat or cold packs to injured areas. °? Limiting activities until pain decreases. ° °Follow these instructions at home: °Medicines °· If you were prescribed an antibiotic, take it as told by your health care provider. Do not stop taking the  antibiotic even if you start to feel better. °· Take over-the-counter and prescription medicines only as told by your health care provider. °Lifestyle °· Do not use any products that contain nicotine or tobacco, such as cigarettes and e-cigarettes. If you need help quitting, ask your health care provider. °· Do not drink alcohol. °· Make lifestyle changes as directed by your health care provider. These may include: °? Getting regular exercise. Ask your health care provider to suggest some activities that are safe for you. °? Eating a heart-healthy diet. A registered dietitian can help you to learn healthy eating options. °? Maintaining a healthy weight. °? Managing diabetes, if necessary. °? Reducing stress, such as with yoga or relaxation techniques. °General instructions °· Avoid any activities that bring on chest pain. °· If heartburn is the cause for your chest pain, raise (elevate) the head of your bed about 6 inches (15 cm) by putting blocks under the legs. Sleeping with more pillows does not effectively relieve heartburn because it only changes the position of your head. °· Keep all follow-up visits as told by your health care provider. This is important. This includes any further testing if your chest pain does not go away. °Contact a health care provider if: °· Your chest pain does not go away. °· You have a rash with blisters on your chest. °· You have a fever. °· You have chills. °Get help right away if: °· Your chest pain is worse. °· You have a cough that gets worse, or you cough up blood. °· You have severe pain in your abdomen. °· You have severe weakness. °· You faint. °· You have sudden, unexplained chest discomfort. °· You have sudden, unexplained discomfort in your arms, back, neck, or jaw. °· You have shortness of breath at any time. °· You suddenly start to sweat, or your skin gets clammy. °· You feel nauseous or you vomit. °· You suddenly feel light-headed or dizzy. °· Your heart begins to beat  quickly, or it feels like it is skipping beats. °These symptoms may represent a serious problem that is an emergency. Do not wait to see if the symptoms will go away. Get medical help right away. Call your local emergency services (911 in the [place] Do not drive yourself to the hospital. °This information is not intended to replace advice given to you by your health care provider. Make sure you discuss any questions you have with your health care provider. °Document Released: 10/28/2004 Document Revised: 10/13/2015 Document Reviewed: 10/13/2015 °Elsevier Interactive Patient Education © 2017 [employer] ° °

## 2017-04-23 NOTE — Progress Notes (Signed)
CSW was consulted for transportation needs to [facility].  Pt resides at [facility].  CSW contacted [facility] Rowan for disposition planning.  Reed Breech LCSWA [phone]

## 2017-04-23 NOTE — Evaluation (Signed)
Physical Therapy Evaluation Patient Details Name: [name] MRN: [id number] DOB: Jun 07, 1923 Today's Date: 04/23/2017   History of Present Illness  84 yo male with onset of chest pain, fall and history of stents was admitted OBS, was able to resolve chest pain, had controlled HR.  PMHx:  atherosclerosis, dizziness, MI, OA, SOB, thrombocytopenia, HTN,   Clinical Impression  Pt is up to walk with supervised use of RW and then nursing asked about using no AD.  He can be assisted carefully to walk on hall rail but with no outright LOB he is able to manage with no AD using touch support.  He will be going home with wife and will need supervised gait, with use RW and SPC that he has already at home.  Follow with HHPT and will require some consideration with wife for ALF setting likely due to cognition and tolerance for independent activity.    Follow Up Recommendations Home health PT;Supervision for mobility/OOB    Equipment Recommendations  Rolling walker with 5" wheels(has a walker and SPC)    Recommendations for Other Services       Precautions / Restrictions Precautions Precautions: Fall(telemetry) Restrictions Weight Bearing Restrictions: No      Mobility  Bed Mobility Overal bed mobility: Modified Independent                Transfers Overall transfer level: Modified independent Equipment used: Rolling walker (2 wheeled);1 person hand held assist             General transfer comment: used RW and then no AD  Ambulation/Gait Ambulation/Gait assistance: Supervision;Min guard Ambulation Distance (Feet): 200 Feet Assistive device: Rolling walker (2 wheeled);1 person hand held assist Gait Pattern/deviations: Step-through pattern;Wide base of support;Decreased stride length Gait velocity: reduced Gait velocity interpretation: Below normal speed for age/gender General Gait Details: pt takes careful steps with reduced tolerance for independent gait. Needs to use RW  but can walk with no AD very cautiously.  Stairs            Wheelchair Mobility    Modified Rankin (Stroke Patients Only)       Balance Overall balance assessment: History of Falls;Needs assistance Sitting-balance support: Feet supported Sitting balance-Leahy Scale: Good     Standing balance support: Bilateral upper extremity supported;No upper extremity supported Standing balance-Leahy Scale: Fair Standing balance comment: fair to fair- for gait                             Pertinent Vitals/Pain Pain Assessment: No/denies pain    Home Living Family/patient expects to be discharged to:: Other (Comment) Living Arrangements: Spouse/significant other([facility] with wife at [facility])                    Prior Function Level of Independence: Independent         Comments: has SPC and walker but does not use them     Hand Dominance   Dominant Hand: Right    Extremity/Trunk Assessment   Upper Extremity Assessment Upper Extremity Assessment: Overall WFL for tasks assessed    Lower Extremity Assessment Lower Extremity Assessment: Overall WFL for tasks assessed;Generalized weakness    Cervical / Trunk Assessment Cervical / Trunk Assessment: Normal  Communication   Communication: No difficulties  Cognition Arousal/Alertness: Awake/alert Behavior During Therapy: Impulsive Overall Cognitive Status: No family/caregiver present to determine baseline cognitive functioning  General Comments: pt is unable to give a full history and is confused about some information      General Comments      Exercises     Assessment/Plan    PT Assessment    PT Problem List         PT Treatment Interventions      PT Goals (Current goals can be found in the Care Plan section)  Acute Rehab PT Goals Patient Stated Goal: none stated PT Goal Formulation: Patient unable to participate in goal setting Time For Goal  Achievement: 05/07/17 Potential to Achieve Goals: Good    Frequency Min 3X/week   Barriers to discharge        Co-evaluation               AM-PAC PT "6 Clicks" Daily Activity  Outcome Measure Difficulty turning over in bed (including adjusting bedclothes, sheets and blankets)?: None Difficulty moving from lying on back to sitting on the side of the bed? : A Little Difficulty sitting down on and standing up from a chair with arms (e.g., wheelchair, bedside commode, etc,.)?: A Little Help needed moving to and from a bed to chair (including a wheelchair)?: A Little Help needed walking in hospital room?: A Little Help needed climbing 3-5 steps with a railing? : A Lot 6 Click Score: 18    End of Session Equipment Utilized During Treatment: Gait belt Activity Tolerance: Patient tolerated treatment well;Other (comment)(balance with no AD) Patient left: in chair;with call bell/phone within reach;with chair alarm set Nurse Communication: Mobility status PT Visit Diagnosis: Unsteadiness on feet (R26.81);Repeated falls (R29.6);Adult, failure to thrive (R62.7)    Time: 1040-1105 PT Time Calculation (min) (ACUTE ONLY): 25 min   Charges:   PT Evaluation $PT Eval Moderate Complexity: 1 Mod PT Treatments $Gait Training: 8-22 mins   PT G Codes:   PT G-Codes **NOT FOR INPATIENT CLASS** Functional Assessment Tool Used: AM-PAC 6 Clicks Basic Mobility    Ramond Dial 04/23/2017, 5:27 PM   Mee Hives, PT MS Acute Rehab Dept. Number: [place] and [place]

## 2017-04-23 NOTE — Progress Notes (Signed)
Progress Note  Patient Name: [name] Date of Encounter: 04/23/2017  Primary Cardiologist: Dr. Darlina Guys  Subjective   No recurrent chest pain or shortness of breath at rest.  No palpitations.  Inpatient Medications    Scheduled Meds: . aspirin EC  81 mg Oral Daily  . atorvastatin  40 mg Oral q1800  . carvedilol  6.25 mg Oral BID WC  . cholecalciferol  1,000 Units Oral Daily  . enoxaparin (LOVENOX) injection  40 mg Subcutaneous Q24H  . erythromycin  1 application Both Eyes QHS  . finasteride  5 mg Oral Daily  . lisinopril  10 mg Oral BID  . pantoprazole  40 mg Oral Daily  . polyethylene glycol  17 g Oral Daily  . vitamin C  500 mg Oral Daily    PRN Meds: acetaminophen, gi cocktail, hydrALAZINE, LORazepam, meclizine, morphine injection, nitroGLYCERIN, ondansetron (ZOFRAN) IV   Vital Signs    Vitals:   04/22/17 1449 04/22/17 1700 04/22/17 2211 04/23/17 0412  BP: 134/75 134/79 135/72 (!) 168/66  Pulse: (!) 56  (!) 51 (!) 50  Resp: 20   16  Temp: 98.6 F (37 C)  97.6 F (36.4 C) 97.6 F (36.4 C)  TempSrc: Oral  Oral Oral  SpO2: 98%  98% 99%  Weight: 152 lb 6.4 oz (69.1 kg)   149 lb (67.6 kg)    Intake/Output Summary (Last 24 hours) at 04/23/2017 1021 Last data filed at 04/22/2017 1900 Gross per 24 hour  Intake 360 ml  Output -  Net 360 ml   Filed Weights   04/22/17 0906 04/22/17 1449 04/23/17 0412  Weight: 156 lb (70.8 kg) 152 lb 6.4 oz (69.1 kg) 149 lb (67.6 kg)    Telemetry    Sinus rhythm with rare PVC.  Personally reviewed.  ECG    Tracing from 04/23/2017 shows sinus bradycardia.  Personally reviewed.  Physical Exam   GEN:  Elderly male.  No acute distress.   Neck: No JVD. Cardiac: RRR, soft systolic murmur, no gallop.  Respiratory: Nonlabored. Clear to auscultation bilaterally. GI: Soft, nontender, bowel sounds present. MS: No edema; No deformity. Neuro:  Nonfocal. Psych: Alert and oriented x 3. Normal affect.  Labs      Chemistry Recent Labs  Lab 04/22/17 0900  NA 137  K 4.6  CL 104  CO2 22  GLUCOSE 121*  BUN 11  CREATININE 1.11  CALCIUM 9.1  GFRNONAA 56*  GFRAA >60  ANIONGAP 11     Hematology Recent Labs  Lab 04/22/17 0900  WBC 8.6  RBC 4.54  HGB 14.2  HCT 42.5  MCV 93.6  MCH 31.3  MCHC 33.4  RDW 13.8  PLT 201    Cardiac Enzymes Recent Labs  Lab 04/22/17 0900 04/22/17 1305 04/22/17 1858 04/23/17 0014  TROPONINI <0.03 0.03* 0.04* 0.04*    Recent Labs  Lab 04/22/17 0917  TROPIPOC 0.01     DDimer  Recent Labs  Lab 04/22/17 1305  DDIMER 0.78*     Radiology    Dg Chest 2 View  Result Date: 04/22/2017 CLINICAL DATA:  Severe chest pain this morning EXAM: CHEST - 2 VIEW COMPARISON:  06/27/2012 FINDINGS: Cardiac shadow is stable. Aortic calcifications are again seen. The lungs are well aerated bilaterally. Degenerative changes of the thoracic spine are noted. IMPRESSION: No active cardiopulmonary disease. Electronically Signed   By: Inez Catalina M.D.   On: 04/22/2017 09:50   Dg Shoulder Left  Result Date: 04/22/2017 CLINICAL DATA:  Recent fall with left shoulder pain, initial encounter EXAM: LEFT SHOULDER - 2+ VIEW COMPARISON:  None. FINDINGS: Mild degenerative changes of the acromioclavicular joint are seen. No acute fracture or dislocation is noted. The underlying bony thorax is unremarkable. IMPRESSION: Mild degenerative change without acute abnormality. Electronically Signed   By: Inez Catalina M.D.   On: 04/22/2017 09:50   Ct Angio Chest Aorta W/cm &/or Wo/cm  Result Date: 04/22/2017 CLINICAL DATA:  Chest pain. EXAM: CT ANGIOGRAPHY CHEST WITH CONTRAST TECHNIQUE: Multidetector CT imaging of the chest was performed using the standard protocol during bolus administration of intravenous contrast. Multiplanar CT image reconstructions and MIPs were obtained to evaluate the vascular anatomy. CONTRAST:  123mL ISOVUE-370 IOPAMIDOL (ISOVUE-370) INJECTION 76% COMPARISON:   Radiographs earlier this day.  No prior chest CT. FINDINGS: Cardiovascular: There are no filling defects within the pulmonary arteries to suggest pulmonary embolus. Aortic tortuosity with atherosclerosis. No dissection or aneurysm. Conventional branching pattern from the aortic arch with atherosclerosis at the great vessel origins. Coronary artery calcifications versus stents. Heart size upper normal. No pericardial effusion. Mediastinum/Nodes: No enlarged mediastinal or hilar lymph nodes. The esophagus is nondistended. No visualized thyroid nodule. Lungs/Pleura: Mild diffuse subpleural reticulation throughout both lungs without definite apical basilar gradient. No focal consolidation. No septal thickening or ground-glass opacities to suggest pulmonary edema. Minimal perifissural atelectasis in the right major fissure. Trace biapical pleuroparenchymal scarring. No pleural effusion. No pulmonary mass. Upper Abdomen: Cyst in the upper left kidney measures 2.8 cm with thin calcified septa. Mild atherosclerosis of upper abdominal vasculature. Musculoskeletal: Multilevel degenerative change throughout the thoracic spine with mild exaggerated thoracic kyphosis. There are no acute or suspicious osseous abnormalities. Review of the MIP images confirms the above findings. IMPRESSION: 1. No pulmonary embolus, aortic dissection, or acute intrathoracic abnormality. 2. Minimal subpleural reticulation throughout both lungs may represent mild interstitial lung disease. 3. Aortic Atherosclerosis (ICD10-I70.0). Coronary artery calcifications/stents. 4. Bosniak 2 cyst in the left kidney measuring 2.8 cm. No further workup or follow-up needed. Electronically Signed   By: Jeb Levering M.D.   On: 04/22/2017 21:51   Dg Hip Unilat W Or Wo Pelvis 2-3 Views Left  Result Date: 04/22/2017 CLINICAL DATA:  Recent fall with left hip pain, initial encounter EXAM: DG HIP (WITH OR WITHOUT PELVIS) 3V LEFT COMPARISON:  None. FINDINGS: Right  hip replacement is noted. Pelvic ring is intact. Mild degenerative changes of the left hip joint are seen. No acute fracture or dislocation is noted. Diffuse vascular calcifications are seen. IMPRESSION: No acute abnormality noted. Electronically Signed   By: Inez Catalina M.D.   On: 04/22/2017 09:51    Cardiac Studies   Echocardiogram 04/23/2017: Study Conclusions  - Left ventricle: The cavity size was normal. Wall thickness was   increased in a pattern of mild LVH. Systolic function was mildly   reduced. The estimated ejection fraction was in the range of 45%   to 50%. There is akinesis of the basal-midinferoseptal   myocardium. Doppler parameters are consistent with abnormal left   ventricular relaxation (grade 1 diastolic dysfunction). - Aortic valve: Trileaflet; mildly thickened, mildly calcified   leaflets. There was mild regurgitation. - Mitral valve: There was mild regurgitation. - Left atrium: The atrium was mildly dilated. - Pulmonary arteries: Systolic pressure was mildly increased. PA   peak pressure: 48 mm Hg (S).  Patient Profile     84 y.o. 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  He presents with atypical chest pain and has ruled out for ACS.  Assessment & Plan    1.  Atypical chest pain, presently resolved.  Troponin I levels are 0.03-0.04 in relatively flat pattern not consistent with ACS.  ECG shows no acute ST segment changes.  Chest CTA showed no evidence of pulmonary embolus or aortic dissection.  2.  Ischemic cardiomyopathy with LVEF 45-50%.  3.  Essential hypertension.  4.  Mixed hyperlipidemia.  No ischemic testing is planned at this time.  Continue with medical therapy which now includes aspirin, Coreg, Lipitor, and lisinopril.  Please arrange follow-up with Dr. Angelena Form or APP in the next 7-10 days.  We will sign  off.  Signed, Rozann Lesches, MD  04/23/2017, 10:21 AM

## 2017-04-23 NOTE — Clinical Social Work Note (Signed)
Clinical Social Work Assessment  Patient Details  Name: [name] MRN: [id number] Date of Birth: 07/08/1923  Date of referral:  04/23/17               Reason for consult:  Discharge Planning                Permission sought to share information with:  Facility Sport and exercise psychologist, Family Supports Permission granted to share information::  Yes, Verbal Permission Granted  Name::     Zayde Stroupe::     Relationship::  Spouse  Contact Information:  yes  Housing/Transportation Living arrangements for the past 2 months:  Charity fundraiser of Information:  Patient Patient Interpreter Needed:  None Criminal Activity/Legal Involvement Pertinent to Current Situation/Hospitalization:  No - Comment as needed Significant Relationships:  Spouse, Adult Children Lives with:  Spouse Do you feel safe going back to the place where you live?  Yes Need for family participation in patient care:  No (Coment)  Care giving concerns:  CSW received consult regarding patient's return to [facility].  CSW spoke with patient. Patient has no care given concerns at this time.   Social Worker assessment / plan:  CSW spoke with patient concerning returning to [employer].  Patient is agreeable to return.   Employment status:  Retired Nurse, adult PT Recommendations:  Not assessed at this time Information / Referral to community resources:  (S) (NA)  Patient/Family's Response to care: Patient reports agreement with discharge plan.  Patient/Family's Understanding of and Emotional Response to Diagnosis, Current Treatment, and Prognosis:  Patient expressed understanding of CSW role and discharge process as well as medical condition.  No questions/concerns about plan or treatment.  Emotional Assessment Appearance:  Appears stated age Attitude/Demeanor/Rapport:  Engaged Affect (typically observed):  Accepting,  Appropriate, Calm Orientation:  Oriented to Self, Oriented to Place, Oriented to  Time, Oriented to Situation Alcohol / Substance use:  Not Applicable Psych involvement (Current and /or in the community):  No (Comment)  Discharge Needs  Concerns to be addressed:  Care Coordination Readmission within the last 30 days:  No Current discharge risk:  None Barriers to Discharge:  Continued Medical Work up   [employer], [place] 04/23/2017, 10:19 AM

## 2017-04-23 NOTE — Discharge Summary (Signed)
Physician Discharge Summary  [name] VWU:[id number] DOB: 1923/07/24 DOA: 04/22/2017  PCP: Cassandria Anger, MD  Admit date: 04/22/2017 Discharge date: 04/23/2017  Admitted From: Home Disposition: Home  Recommendations for Outpatient Follow-up:  1. Follow up with PCP in 1 week 2. Follow up with cardiologist in 2 weeks 3. Please obtain BMP/CBC in one week 4. Please follow up on the following pending results: None  Home Health: Home Health Equipment/Devices: Rolling walker  Discharge Condition: Stable CODE STATUS: Full code Diet recommendation: Heart healthy   Brief/Interim Summary:  Admission HPI written by Dyanne Carrel, NP   Chief Complaint: chest pain  HPI: [name] is a delightful 84 y.o. 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 Initial evaluation concerning for ACS. [facility] are asked to admit  Information is obtained from the patient and his wife who is at the bedside. He states he was his usual state of health when he was walking to breakfast and he developed sudden left anterior chest pain. He describes the pain like "someone was punching me". Initially he lost his breath and the pain was so severe he fell down. He denies headache dizziness syncope or near-syncope. He states the pain was nonradiating and lasted no more than a minute. He denies diaphoresis nausea vomiting. He denies lower extremity edema or orthopnea.He denies numbness tingling of his extremities or difficulty speaking chewing or swallowing. He reports EMS was called. He reports that he fell he fell on his left hip and left shoulder. He denies dysuria hematuria frequency or urgency. He denies abdominal pain diarrhea constipation melena or bright red blood per rectum. Patient was provided with 325 mg aspirin but he reports he did not take it.   ED Course: in  the emergency department he's afebrile hemodynamically stable with a blood pressure on the high end of normal. He is not hypoxic.    Hospital course:  Chest pain Atypical. ACS ruled out. Troponin elevated with flat trend. CTA significant for no pulmonary embolism or dissection. Cardiology evaluated and recommended outpatient follow-up.  Essential hypertension Continue lisinopril and Coreg.  Fall Secondary to his episode of pain. No fractures. No loss of consciousness. PT recommending home health.  BPH Continued finasteride  Hyperlipidemia LDL of 73. Switched to Lipitor  Reduced systolic heart function No heart failure.  Bradycardia Sinus rhythm.  Discharge Diagnoses:  Principal Problem:   Chest pain Active Problems:   Dyslipidemia   Essential hypertension   Coronary atherosclerosis   BPH (benign prostatic hyperplasia)   Fall    Discharge Instructions   Allergies as of 04/23/2017      Reactions   Oxycodone Nausea And Vomiting   Penicillins Hives      Medication List    STOP taking these medications   cephALEXin 500 MG capsule Commonly known as:  KEFLEX   erythromycin ophthalmic ointment   simvastatin 80 MG tablet Commonly known as:  ZOCOR Replaced by:  atorvastatin 40 MG tablet     TAKE these medications   aspirin 81 MG EC tablet Take 81 mg by mouth daily.   atorvastatin 40 MG tablet Commonly known as:  LIPITOR Take 1 tablet (40 mg total) by mouth daily at 6 PM. Replaces:  simvastatin 80 MG tablet   b complex vitamins tablet Take 1 tablet by mouth daily.   carvedilol 6.25 MG tablet Commonly known as:  COREG Take 1 tablet (  6.25 mg total) by mouth 2 (two) times daily with a meal.   Cholecalciferol 1000 units tablet Take 1,000 Units by mouth daily.   clopidogrel 75 MG tablet Commonly known as:  PLAVIX Take 1 tablet (75 mg total) by mouth daily.   finasteride 5 MG tablet Commonly known as:  PROSCAR Take 1 tablet (5 mg total) by mouth  daily.   GLUCOSAMINE 1500 COMPLEX PO Take 1 tablet by mouth 2 (two) times daily.   lisinopril 10 MG tablet Commonly known as:  PRINIVIL,ZESTRIL Take 1 tablet (10 mg total) by mouth 2 (two) times daily.   meclizine 12.5 MG tablet Commonly known as:  ANTIVERT Take 1 tablet (12.5 mg total) by mouth 3 (three) times daily as needed for dizziness.   nitroGLYCERIN 0.4 MG SL tablet Commonly known as:  NITROSTAT Place 1 tablet (0.4 mg total) under the tongue every 5 (five) minutes as needed for chest pain (Please call your doctor/911 if no improvement after the second dose). What changed:  reasons to take this   pantoprazole 40 MG tablet Commonly known as:  PROTONIX Take 1 tablet (40 mg total) by mouth daily.   polyethylene glycol packet Commonly known as:  MIRALAX / GLYCOLAX Take 17 g by mouth daily.   vitamin C 500 MG tablet Commonly known as:  ASCORBIC ACID Take 500 mg by mouth daily.       Allergies  Allergen Reactions  . Oxycodone Nausea And Vomiting  . Penicillins Hives    Consultations:  Cardiology   Procedures/Studies: Dg Chest 2 View  Result Date: 04/22/2017 CLINICAL DATA:  Severe chest pain this morning EXAM: CHEST - 2 VIEW COMPARISON:  06/27/2012 FINDINGS: Cardiac shadow is stable. Aortic calcifications are again seen. The lungs are well aerated bilaterally. Degenerative changes of the thoracic spine are noted. IMPRESSION: No active cardiopulmonary disease. Electronically Signed   By: Inez Catalina M.D.   On: 04/22/2017 09:50   Dg Shoulder Left  Result Date: 04/22/2017 CLINICAL DATA:  Recent fall with left shoulder pain, initial encounter EXAM: LEFT SHOULDER - 2+ VIEW COMPARISON:  None. FINDINGS: Mild degenerative changes of the acromioclavicular joint are seen. No acute fracture or dislocation is noted. The underlying bony thorax is unremarkable. IMPRESSION: Mild degenerative change without acute abnormality. Electronically Signed   By: Inez Catalina M.D.   On:  04/22/2017 09:50   Ct Angio Chest Aorta W/cm &/or Wo/cm  Result Date: 04/22/2017 CLINICAL DATA:  Chest pain. EXAM: CT ANGIOGRAPHY CHEST WITH CONTRAST TECHNIQUE: Multidetector CT imaging of the chest was performed using the standard protocol during bolus administration of intravenous contrast. Multiplanar CT image reconstructions and MIPs were obtained to evaluate the vascular anatomy. CONTRAST:  124mL ISOVUE-370 IOPAMIDOL (ISOVUE-370) INJECTION 76% COMPARISON:  Radiographs earlier this day.  No prior chest CT. FINDINGS: Cardiovascular: There are no filling defects within the pulmonary arteries to suggest pulmonary embolus. Aortic tortuosity with atherosclerosis. No dissection or aneurysm. Conventional branching pattern from the aortic arch with atherosclerosis at the great vessel origins. Coronary artery calcifications versus stents. Heart size upper normal. No pericardial effusion. Mediastinum/Nodes: No enlarged mediastinal or hilar lymph nodes. The esophagus is nondistended. No visualized thyroid nodule. Lungs/Pleura: Mild diffuse subpleural reticulation throughout both lungs without definite apical basilar gradient. No focal consolidation. No septal thickening or ground-glass opacities to suggest pulmonary edema. Minimal perifissural atelectasis in the right major fissure. Trace biapical pleuroparenchymal scarring. No pleural effusion. No pulmonary mass. Upper Abdomen: Cyst in the upper left kidney measures 2.8 cm  with thin calcified septa. Mild atherosclerosis of upper abdominal vasculature. Musculoskeletal: Multilevel degenerative change throughout the thoracic spine with mild exaggerated thoracic kyphosis. There are no acute or suspicious osseous abnormalities. Review of the MIP images confirms the above findings. IMPRESSION: 1. No pulmonary embolus, aortic dissection, or acute intrathoracic abnormality. 2. Minimal subpleural reticulation throughout both lungs may represent mild interstitial lung disease.  3. Aortic Atherosclerosis (ICD10-I70.0). Coronary artery calcifications/stents. 4. Bosniak 2 cyst in the left kidney measuring 2.8 cm. No further workup or follow-up needed. Electronically Signed   By: Jeb Levering M.D.   On: 04/22/2017 21:51   Dg Hip Unilat W Or Wo Pelvis 2-3 Views Left  Result Date: 04/22/2017 CLINICAL DATA:  Recent fall with left hip pain, initial encounter EXAM: DG HIP (WITH OR WITHOUT PELVIS) 3V LEFT COMPARISON:  None. FINDINGS: Right hip replacement is noted. Pelvic ring is intact. Mild degenerative changes of the left hip joint are seen. No acute fracture or dislocation is noted. Diffuse vascular calcifications are seen. IMPRESSION: No acute abnormality noted. Electronically Signed   By: Inez Catalina M.D.   On: 04/22/2017 09:51     Transthoracic Echocardiogram (04/22/17) Study Conclusions  - Left ventricle: The cavity size was normal. Wall thickness was   increased in a pattern of mild LVH. Systolic function was mildly   reduced. The estimated ejection fraction was in the range of 45%   to 50%. There is akinesis of the basal-midinferoseptal   myocardium. Doppler parameters are consistent with abnormal left   ventricular relaxation (grade 1 diastolic dysfunction). - Aortic valve: Trileaflet; mildly thickened, mildly calcified   leaflets. There was mild regurgitation. - Mitral valve: There was mild regurgitation. - Left atrium: The atrium was mildly dilated. - Pulmonary arteries: Systolic pressure was mildly increased. PA   peak pressure: 48 mm Hg (S).   Subjective: No chest pain.  Discharge Exam: Vitals:   04/22/17 2211 04/23/17 0412  BP: 135/72 (!) 168/66  Pulse: (!) 51 (!) 50  Resp:  16  Temp: 97.6 F (36.4 C) 97.6 F (36.4 C)  SpO2: 98% 99%   Vitals:   04/22/17 1449 04/22/17 1700 04/22/17 2211 04/23/17 0412  BP: 134/75 134/79 135/72 (!) 168/66  Pulse: (!) 56  (!) 51 (!) 50  Resp: 20   16  Temp: 98.6 F (37 C)  97.6 F (36.4 C) 97.6 F (36.4  C)  TempSrc: Oral  Oral Oral  SpO2: 98%  98% 99%  Weight: 69.1 kg (152 lb 6.4 oz)   67.6 kg (149 lb)    General: Pt is alert, awake, not in acute distress   The results of significant diagnostics from this hospitalization (including imaging, microbiology, ancillary and laboratory) are listed below for reference.     Microbiology: No results found for this or any previous visit (from the past 240 hour(s)).   Labs: BNP (last 3 results) No results for input(s): BNP in the last 8760 hours. Basic Metabolic Panel: Recent Labs  Lab 04/22/17 0900  NA 137  K 4.6  CL 104  CO2 22  GLUCOSE 121*  BUN 11  CREATININE 1.11  CALCIUM 9.1   Liver Function Tests: No results for input(s): AST, ALT, ALKPHOS, BILITOT, PROT, ALBUMIN in the last 168 hours. No results for input(s): LIPASE, AMYLASE in the last 168 hours. No results for input(s): AMMONIA in the last 168 hours. CBC: Recent Labs  Lab 04/22/17 0900  WBC 8.6  HGB 14.2  HCT 42.5  MCV 93.6  PLT 201   Cardiac Enzymes: Recent Labs  Lab 04/22/17 0900 04/22/17 1305 04/22/17 1858 04/23/17 0014  TROPONINI <0.03 0.03* 0.04* 0.04*   BNP: Invalid input(s): POCBNP CBG: No results for input(s): GLUCAP in the last 168 hours. D-Dimer Recent Labs    04/22/17 1305  DDIMER 0.78*   Hgb A1c No results for input(s): HGBA1C in the last 72 hours. Lipid Profile Recent Labs    04/22/17 0900  CHOL 159  HDL 72  LDLCALC 73  TRIG 68  CHOLHDL 2.2   Thyroid function studies No results for input(s): TSH, T4TOTAL, T3FREE, THYROIDAB in the last 72 hours.  Invalid input(s): FREET3 Anemia work up No results for input(s): VITAMINB12, FOLATE, FERRITIN, TIBC, IRON, RETICCTPCT in the last 72 hours. Urinalysis    Component Value Date/Time   COLORURINE YELLOW 08/07/2013 1444   APPEARANCEUR CLEAR 08/07/2013 1444   LABSPEC 1.025 08/07/2013 1444   PHURINE 5.5 08/07/2013 1444   GLUCOSEU NEGATIVE 08/07/2013 1444   HGBUR NEGATIVE  08/07/2013 1444   BILIRUBINUR NEGATIVE 08/07/2013 1444   KETONESUR TRACE (A) 08/07/2013 1444   PROTEINUR NEGATIVE 06/12/2013 1846   UROBILINOGEN 0.2 08/07/2013 1444   NITRITE NEGATIVE 08/07/2013 1444   LEUKOCYTESUR NEGATIVE 08/07/2013 1444    SIGNED:   Cordelia Poche, MD [facility] 04/23/2017, 6:00 AM Pager [phone]  If 7PM-7AM, please contact night-coverage [email or web address] Password [email or web address]

## 2017-04-23 NOTE — Progress Notes (Signed)
Received call from Levy Sjogren; patient is from [facility] and need ambulance transportation home; home address verified with Shaune Leeks [place] [phone]

## 2017-04-23 NOTE — Plan of Care (Signed)
D/C today to Independent living

## 2017-04-25 ENCOUNTER — Telehealth: Payer: Self-pay | Admitting: *Deleted

## 2017-04-25 NOTE — Telephone Encounter (Signed)
Called pt spoke w/wife  Will be seeing cardiology Thursday 04/28/17  for hosp f/u. Did not want to make appt w/Dr. plotnikov she states there was no need to see both.Marland KitchenJohny Riddle

## 2017-04-27 NOTE — Progress Notes (Signed)
Cardiology Office Note    Date:  04/28/2017   ID:  [name], DOB 1923/08/13, MRN [id number]  PCP:  Ricky Anger, MD  Cardiologist:  Dr. Angelena Form  Chief Complaint: Hospital follow up  History of Present Illness:   [name] is a 84 y.o. male with a hx of CAD, HTN and HLD presents for hospital follow up.   Hx of inferior MIin2002and had abare metal stentplacedin the circumflex artery and subsequent unstable angina August 2007 with placement of drug eluting stent in the mid LAD. He has had no ischemic testing since 2012.   She was doing well on cardiac stand point when last seen by [facility] 11/04/2016.  [name] admitte about week ago with chest pain leading to fall. He was sitting on chair for breakfast and had sudden onset left sided chest pain. Describes as "someone punched him". He pain was so bad that he fall from chair and hurt his left shoulder and hip.  Pain lasted for a few seconds. Troponin I levels are 0.03-0.04 in relatively flat pattern not consistent with ACS.  ECG shows no acute ST segment changes.  Chest CTA showed no evidence of pulmonary embolus or aortic dissection. Echo showed LVEF of 45-50%, grade 1 DD, mild MR and PA pressure of 48 mm Hg. Felt atypical chest pain.   Here today for follow up. No recurrent chest pain or fall. Denies SOB, palpitations, dizziness,  LE edema, orthopnea, PND, or syncope. He exercise 3 times per week without any angina or dyspnea. Here with wife, daughter and son in law. The patient and wife lives at independent living facility. Concern regarding memory issue.    Past Medical History:  Diagnosis Date  . Coronary atherosclerosis of unspecified type of vessel, native or graft   . Dizziness and giddiness   . Hypertrophy of prostate without urinary obstruction and other lower urinary tract symptoms (LUTS)   . MI (myocardial infarction) ([place]) 2002  . Osteoarthrosis, unspecified whether generalized or localized,  unspecified site   . Other and unspecified hyperlipidemia   . Shortness of breath dyspnea    with exertion  . Skin cancer    "top of his head"  . Thrombocytopenia, unspecified ([name])   . Unspecified essential hypertension     Past Surgical History:  Procedure Laterality Date  . COLONOSCOPY    . CORONARY ANGIOPLASTY  01/07/2001, 2008   2 stents  . ESOPHAGOGASTRODUODENOSCOPY N/A 04/22/2014   Procedure: ESOPHAGOGASTRODUODENOSCOPY (EGD);  Surgeon: Ricky Banister, MD;  Location: [facility];  Service: Endoscopy;  Laterality: N/A;  . HIP ARTHROPLASTY Right 2006  . JOINT REPLACEMENT    . MOHS SURGERY     "top of his head"  . TRANSURETHRAL RESECTION OF PROSTATE      Current Medications: Prior to Admission medications   Medication Sig Start Date End Date Taking? Authorizing Provider  aspirin 81 MG EC tablet Take 81 mg by mouth daily.      [provider]  atorvastatin (LIPITOR) 40 MG tablet Take 1 tablet (40 mg total) by mouth daily at 6 PM. 04/23/17   Mariel Aloe, MD  b complex vitamins tablet Take 1 tablet by mouth daily.      [provider]  carvedilol (COREG) 6.25 MG tablet Take 1 tablet (6.25 mg total) by mouth 2 (two) times daily with a meal. 02/10/17   Plotnikov, Evie Lacks, MD  Cholecalciferol 1000 UNITS tablet Take 1,000 Units by mouth daily.  [provider]  clopidogrel (PLAVIX) 75 MG tablet Take 1 tablet (75 mg total) by mouth daily. 02/24/17   Plotnikov, Evie Lacks, MD  finasteride (PROSCAR) 5 MG tablet Take 1 tablet (5 mg total) by mouth daily. 11/04/16   Plotnikov, Evie Lacks, MD  Glucosamine-Chondroit-Vit C-Mn (GLUCOSAMINE 1500 COMPLEX PO) Take 1 tablet by mouth 2 (two) times daily.     [provider]  lisinopril (PRINIVIL,ZESTRIL) 10 MG tablet Take 1 tablet (10 mg total) by mouth 2 (two) times daily. 11/22/16   Plotnikov, Evie Lacks, MD  meclizine (ANTIVERT) 12.5 MG tablet Take 1 tablet (12.5 mg total) by mouth 3 (three) times daily as  needed for dizziness. 10/12/16 10/12/17  Plotnikov, Evie Lacks, MD  nitroGLYCERIN (NITROSTAT) 0.4 MG SL tablet Place 1 tablet (0.4 mg total) under the tongue every 5 (five) minutes as needed for chest pain (Please call your doctor/911 if no improvement after the second dose). 04/23/17   Mariel Aloe, MD  pantoprazole (PROTONIX) 40 MG tablet Take 1 tablet (40 mg total) by mouth daily. 02/24/17   Plotnikov, Evie Lacks, MD  polyethylene glycol (MIRALAX / GLYCOLAX) packet Take 17 g by mouth daily.    [provider]  vitamin C (ASCORBIC ACID) 500 MG tablet Take 500 mg by mouth daily.    [provider]    Allergies:   Oxycodone and Penicillins   Social History   Socioeconomic History  . Marital status: Married    Spouse name: [name]  . Number of children: 2  . Years of education: Not on file  . Highest education level: Not on file  Occupational History  . Occupation: Retired    Fish farm manager: RETIRED  Social Needs  . Financial resource strain: Not on file  . Food insecurity:    Worry: Not on file    Inability: Not on file  . Transportation needs:    Medical: Not on file    Non-medical: Not on file  Tobacco Use  . Smoking status: Never Smoker  . Smokeless tobacco: Never Used  Substance and Sexual Activity  . Alcohol use: No    Alcohol/week: 0.0 oz  . Drug use: No  . Sexual activity: Not Currently  Lifestyle  . Physical activity:    Days per week: Not on file    Minutes per session: Not on file  . Stress: Not on file  Relationships  . Social connections:    Talks on phone: Not on file    Gets together: Not on file    Attends religious service: Not on file    Active member of club or organization: Not on file    Attends meetings of clubs or organizations: Not on file    Relationship status: Not on file  Other Topics Concern  . Not on file  Social History Narrative   Regular Exercise -  YES           Family History:  The patient's family history includes  Coronary artery disease in his other; Heart disease in his father.  ROS:   Please see the history of present illness.    ROS All other systems reviewed and are negative.   PHYSICAL EXAM:   VS:  BP (!) 150/74   Pulse (!) 53   Ht 5\' 8"  (1.727 m)   Wt 156 lb (70.8 kg)   SpO2 97%   BMI 23.72 kg/m    GEN: Elderly male in no acute distress  HEENT: normal  Neck:  no JVD, carotid bruits, or masses Cardiac: RRR; no murmurs, rubs, or gallops,no edema  Respiratory:  clear to auscultation bilaterally, normal work of breathing GI: soft, nontender, nondistended, + BS MS: no deformity or atrophy  Skin: warm and dry, no rash Neuro:  Alert and Oriented x 3, Strength and sensation are intact Psych: euthymic mood, full affect  Wt Readings from Last 3 Encounters:  04/28/17 156 lb (70.8 kg)  04/23/17 149 lb (67.6 kg)  03/01/17 156 lb (70.8 kg)      Studies/Labs Reviewed:   EKG:  EKG is not ordered today.    Recent Labs: 05/03/2016: ALT 14; TSH 3.02 04/22/2017: BUN 11; Creatinine, Ser 1.11; Hemoglobin 14.2; Platelets 201; Potassium 4.6; Sodium 137   Lipid Panel    Component Value Date/Time   CHOL 159 04/22/2017 0900   TRIG 68 04/22/2017 0900   HDL 72 04/22/2017 0900   CHOLHDL 2.2 04/22/2017 0900   VLDL 14 04/22/2017 0900   LDLCALC 73 04/22/2017 0900    Additional studies/ records that were reviewed today include:   Echocardiogram 04/23/2017: Study Conclusions  - Left ventricle: The cavity size was normal. Wall thickness was increased in a pattern of mild LVH. Systolic function was mildly reduced. The estimated ejection fraction was in the range of 45% to 50%. There is akinesis of the basal-midinferoseptal myocardium. Doppler parameters are consistent with abnormal left ventricular relaxation (grade 1 diastolic dysfunction). - Aortic valve: Trileaflet; mildly thickened, mildly calcified leaflets. There was mild regurgitation. - Mitral valve: There was mild  regurgitation. - Left atrium: The atrium was mildly dilated. - Pulmonary arteries: Systolic pressure was mildly increased. PA peak pressure: 48 mm Hg (S).     ASSESSMENT & PLAN:    1. CAD No recurrent chest pain. Continue ASA, Plavix, BB and statin.   2. HTN - Minimally elevated. No change.   3. Mild cardiomyopathy - Echo showed LVEF of 45-50%, grade 1 DD, mild MR and PA pressure of 48 mm Hg. His LVEF was 60% by echo in 2009. - Euvolemic. No recurrent chest pain. I do not think any ischemic evaluation warrants at this time or in further. Likely medical management ongoing forwards. Will keep appointment with Dr. Angelena Form next month for further discussion. Encouraged follow up with PCP to discuss long term goals of care. Use walker. Continue exercise regimen without fall.  4. HLD - 04/22/2017: Cholesterol 159; HDL 72; LDL Cholesterol 73; Triglycerides 68; VLDL 14  - Some reason Lipitor changed to Simvastatin at discharge. However patient continued to take Lipitor. LDL almost at goal and he is tolerating well. Will continue lipitor 40mg  QD.   Medication Adjustments/Labs and Tests Ordered: Current medicines are reviewed at length with the patient today.  Concerns regarding medicines are outlined above.  Medication changes, Labs and Tests ordered today are listed in the Patient Instructions below. Patient Instructions  Your physician recommends that you continue on your current medications as directed. Please refer to the Current Medication list given to you today.   Your physician recommends that you schedule a follow-up appointment in:  AS SCHEDULED WITH DR Justin Mend, [place]  04/28/2017 11:58 AM    [address], [place],   [address] Phone: [phone]; Fax: [phone]

## 2017-04-28 ENCOUNTER — Ambulatory Visit (INDEPENDENT_AMBULATORY_CARE_PROVIDER_SITE_OTHER): Payer: Medicare Other | Admitting: Physician Assistant

## 2017-04-28 ENCOUNTER — Encounter: Payer: Self-pay | Admitting: Physician Assistant

## 2017-04-28 VITALS — BP 150/74 | HR 53 | Ht 68.0 in | Wt 156.0 lb

## 2017-04-28 DIAGNOSIS — E785 Hyperlipidemia, unspecified: Secondary | ICD-10-CM | POA: Diagnosis not present

## 2017-04-28 DIAGNOSIS — I2510 Atherosclerotic heart disease of native coronary artery without angina pectoris: Secondary | ICD-10-CM | POA: Diagnosis not present

## 2017-04-28 DIAGNOSIS — I428 Other cardiomyopathies: Secondary | ICD-10-CM | POA: Diagnosis not present

## 2017-04-28 DIAGNOSIS — I10 Essential (primary) hypertension: Secondary | ICD-10-CM

## 2017-04-28 NOTE — Patient Instructions (Signed)
Your physician recommends that you continue on your current medications as directed. Please refer to the Current Medication list given to you today.   Your physician recommends that you schedule a follow-up appointment in:  [name]

## 2017-05-12 ENCOUNTER — Telehealth: Payer: Self-pay | Admitting: Internal Medicine

## 2017-05-12 ENCOUNTER — Other Ambulatory Visit: Payer: Self-pay | Admitting: *Deleted

## 2017-05-12 MED ORDER — FINASTERIDE 5 MG PO TABS: 5.0000 mg | ORAL_TABLET | Freq: Every day | ORAL | 1 refills | Status: DC

## 2017-05-12 NOTE — Progress Notes (Signed)
Chief Complaint  Patient presents with  . Follow-up    CAD    History of Present Illness: 84 yo male with history of CAD, HTN and HLD here today for cardiac follow up. He had an inferior MI in 2002 and had a bare metal stent placed in the circumflex artery and subsequent unstable angina August 2007 with placement of drug eluting stent in the mid LAD. He was admitted to [facility] 04/22/17 with chest pain. Troponin was negative. Chest CTA without evidence of PE or aortic dissection. Echo 04/22/17 with LVEF=45-50%, mild LVH. Mild AI and MR.   He is here today for follow up. No recurrent chest pain. He has been exercising three days per week. The patient denies any dyspnea, palpitations, lower extremity edema, orthopnea, PND, dizziness, near syncope or syncope.    Primary Care Physician: Cassandria Anger, MD  Past Medical History:  Diagnosis Date  . Coronary atherosclerosis of unspecified type of vessel, native or graft   . Dizziness and giddiness   . Hypertrophy of prostate without urinary obstruction and other lower urinary tract symptoms (LUTS)   . MI (myocardial infarction) ([place]) 2002  . Osteoarthrosis, unspecified whether generalized or localized, unspecified site   . Other and unspecified hyperlipidemia   . Shortness of breath dyspnea    with exertion  . Skin cancer    "top of his head"  . Thrombocytopenia, unspecified ([place])   . Unspecified essential hypertension     Past Surgical History:  Procedure Laterality Date  . COLONOSCOPY    . CORONARY ANGIOPLASTY  01/07/2001, 2008   2 stents  . ESOPHAGOGASTRODUODENOSCOPY N/A 04/22/2014   Procedure: ESOPHAGOGASTRODUODENOSCOPY (EGD);  Surgeon: Milus Banister, MD;  Location: [place];  Service: Endoscopy;  Laterality: N/A;  . HIP ARTHROPLASTY Right 2006  . JOINT REPLACEMENT    . MOHS SURGERY     "top of his head"  . TRANSURETHRAL RESECTION OF PROSTATE      Current Outpatient Medications  Medication Sig Dispense Refill  .  aspirin 81 MG EC tablet Take 81 mg by mouth daily.      [name] b complex vitamins tablet Take 1 tablet by mouth daily.      . carvedilol (COREG) 6.25 MG tablet Take 1 tablet (6.25 mg total) by mouth 2 (two) times daily with a meal. 180 tablet 1  . Cholecalciferol 1000 UNITS tablet Take 1,000 Units by mouth daily.      . clopidogrel (PLAVIX) 75 MG tablet Take 1 tablet (75 mg total) by mouth daily. 30 tablet 2  . finasteride (PROSCAR) 5 MG tablet Take 1 tablet (5 mg total) by mouth daily. 90 tablet 1  . Glucosamine-Chondroit-Vit C-Mn (GLUCOSAMINE 1500 COMPLEX PO) Take 1 tablet by mouth 2 (two) times daily.     [name] lisinopril (PRINIVIL,ZESTRIL) 10 MG tablet Take 1 tablet (10 mg total) by mouth 2 (two) times daily. 60 tablet 11  . meclizine (ANTIVERT) 12.5 MG tablet Take 1 tablet (12.5 mg total) by mouth 3 (three) times daily as needed for dizziness. 60 tablet 1  . nitroGLYCERIN (NITROSTAT) 0.4 MG SL tablet Place 1 tablet (0.4 mg total) under the tongue every 5 (five) minutes as needed for chest pain (Please call your doctor/911 if no improvement after the second dose). 10 tablet 0  . pantoprazole (PROTONIX) 40 MG tablet Take 1 tablet (40 mg total) by mouth daily. 30 tablet 2  . polyethylene glycol (MIRALAX / GLYCOLAX) packet Take 17 g by mouth daily.    [name]  simvastatin (ZOCOR) 80 MG tablet Take 80 mg by mouth daily.    . vitamin C (ASCORBIC ACID) 500 MG tablet Take 500 mg by mouth daily.     No current facility-administered medications for this visit.     Allergies  Allergen Reactions  . Oxycodone Nausea And Vomiting  . Penicillins Hives    Social History   Socioeconomic History  . Marital status: Married    Spouse name: [name]  . Number of children: 2  . Years of education: Not on file  . Highest education level: Not on file  Occupational History  . Occupation: Retired    Fish farm manager: RETIRED  Social Needs  . Financial resource strain: Not on file  . Food insecurity:    Worry: Not on file     Inability: Not on file  . Transportation needs:    Medical: Not on file    Non-medical: Not on file  Tobacco Use  . Smoking status: Never Smoker  . Smokeless tobacco: Never Used  Substance and Sexual Activity  . Alcohol use: No    Alcohol/week: 0.0 oz  . Drug use: No  . Sexual activity: Not Currently  Lifestyle  . Physical activity:    Days per week: Not on file    Minutes per session: Not on file  . Stress: Not on file  Relationships  . Social connections:    Talks on phone: Not on file    Gets together: Not on file    Attends religious service: Not on file    Active member of club or organization: Not on file    Attends meetings of clubs or organizations: Not on file    Relationship status: Not on file  . Intimate partner violence:    Fear of current or ex partner: Not on file    Emotionally abused: Not on file    Physically abused: Not on file    Forced sexual activity: Not on file  Other Topics Concern  . Not on file  Social History Narrative   Regular Exercise -  YES          Family History  Problem Relation Age of Onset  . Heart disease Father   . Coronary artery disease Other   . Colon cancer Neg Hx     Review of Systems:  As stated in the HPI and otherwise negative.   BP 138/68   Pulse (!) 59   Ht 5\' 9"  (1.753 m)   Wt 158 lb (71.7 kg)   SpO2 97%   BMI 23.33 kg/m   Physical Examination:  General: Well developed, well nourished, NAD  HEENT: OP clear, mucus membranes moist  SKIN: warm, dry. No rashes. Neuro: No focal deficits  Musculoskeletal: Muscle strength 5/5 all ext  Psychiatric: Mood and affect normal  Neck: No JVD, no carotid bruits, no thyromegaly, no lymphadenopathy.  Lungs:Clear bilaterally, no wheezes, rhonci, crackles Cardiovascular: Regular rate and rhythm. No murmurs, gallops or rubs. Abdomen:Soft. Bowel sounds present. Non-tender.  Extremities: No lower extremity edema. Pulses are 2 + in the bilateral DP/PT.  Echo 04/22/17: -  Left ventricle: The cavity size was normal. Wall thickness was   increased in a pattern of mild LVH. Systolic function was mildly   reduced. The estimated ejection fraction was in the range of 45%   to 50%. There is akinesis of the basal-midinferoseptal   myocardium. Doppler parameters are consistent with abnormal left   ventricular relaxation (grade 1 diastolic dysfunction). - Aortic  valve: Trileaflet; mildly thickened, mildly calcified   leaflets. There was mild regurgitation. - Mitral valve: There was mild regurgitation. - Left atrium: The atrium was mildly dilated. - Pulmonary arteries: Systolic pressure was mildly increased. PA   peak pressure: 48 mm Hg (S).  EKG:  EKG is not ordered today. The ekg ordered today demonstrates   Recent Labs: 04/22/2017: BUN 11; Creatinine, Ser 1.11; Hemoglobin 14.2; Platelets 201; Potassium 4.6; Sodium 137   Lipid Panel    Component Value Date/Time   CHOL 159 04/22/2017 0900   TRIG 68 04/22/2017 0900   HDL 72 04/22/2017 0900   CHOLHDL 2.2 04/22/2017 0900   VLDL 14 04/22/2017 0900   LDLCALC 73 04/22/2017 0900     Wt Readings from Last 3 Encounters:  05/13/17 158 lb (71.7 kg)  04/28/17 156 lb (70.8 kg)  04/23/17 149 lb (67.6 kg)     Other studies Reviewed: Additional studies/ records that were reviewed today include: . Review of the above records demonstrates:    Assessment and Plan:   1. CAD without angina: Recent episode of chest pain on 04/22/17 and was admitted with negative workup for ischemia. This was not felt to be cardiac. He has had no recurrent pain and has been exercising. I will continue ASA, statin, beta blocker.  He will stay on Zocor.   2. HTN: BP is well controlled. No changes.   Current medicines are reviewed at length with the patient today.  The patient does not have concerns regarding medicines.  The following changes have been made:  no change  Labs/ tests ordered today include:   No orders of the defined  types were placed in this encounter.   Disposition:   FU with me in 6  months  Signed, Lauree Chandler, MD 05/13/2017 2:30 PM    [address], [place], [place]  [address] Phone: [phone]; Fax: [phone]

## 2017-05-12 NOTE — Telephone Encounter (Signed)
Copied from [place] [phone]. Topic: General - Other >> May 10, 2017 10:12 AM Oneta Rack wrote: [place], [place] [phone] (Phone) [phone] (Fax)   Reason for call:  Pharmacy requesting 30 day supply finasteride (PROSCAR) 5 MG tablet  >> May 10, 2017 10:18 AM Oneta Rack wrote: [place] [phone] (Phone) [phone] (Fax)   Reason for call:  Pharmacy requesting 30 day supply finasteride (PROSCAR) 5 MG tablet     Pharmacy calling back because they had not heard anything. Pharmacy closed on Friday.  They need today.

## 2017-05-12 NOTE — Telephone Encounter (Signed)
Rx refilled per OV note to continue 03/01/17. Next visit May/2019

## 2017-05-13 ENCOUNTER — Ambulatory Visit (INDEPENDENT_AMBULATORY_CARE_PROVIDER_SITE_OTHER): Payer: Medicare Other | Admitting: Cardiovascular Disease

## 2017-05-13 ENCOUNTER — Encounter: Payer: Self-pay | Admitting: Cardiovascular Disease

## 2017-05-13 VITALS — BP 138/68 | HR 59 | Ht 69.0 in | Wt 158.0 lb

## 2017-05-13 DIAGNOSIS — I2510 Atherosclerotic heart disease of native coronary artery without angina pectoris: Secondary | ICD-10-CM

## 2017-05-13 DIAGNOSIS — I10 Essential (primary) hypertension: Secondary | ICD-10-CM

## 2017-05-13 NOTE — Patient Instructions (Signed)

## 2017-05-26 ENCOUNTER — Telehealth: Payer: Self-pay | Admitting: Internal Medicine

## 2017-05-26 DIAGNOSIS — I10 Essential (primary) hypertension: Secondary | ICD-10-CM

## 2017-05-26 DIAGNOSIS — I2510 Atherosclerotic heart disease of native coronary artery without angina pectoris: Secondary | ICD-10-CM

## 2017-05-26 NOTE — Telephone Encounter (Signed)
Copied from [place] [id number]. Topic: Quick Communication - See Telephone Encounter >> May 26, 2017 10:41 AM Ahmed Prima L wrote: CRM for notification. See Telephone encounter for: 05/26/17.  pantoprazole (PROTONIX) 40 MG tablet clopidogrel (PLAVIX) 75 MG tablet  [place], [place] [facility] [place]

## 2017-05-27 MED ORDER — PANTOPRAZOLE SODIUM 40 MG PO TBEC: 40.0000 mg | DELAYED_RELEASE_TABLET | Freq: Every day | ORAL | 2 refills | Status: DC

## 2017-05-27 MED ORDER — CLOPIDOGREL BISULFATE 75 MG PO TABS: 75.0000 mg | ORAL_TABLET | Freq: Every day | ORAL | 2 refills | Status: DC

## 2017-06-01 ENCOUNTER — Ambulatory Visit (INDEPENDENT_AMBULATORY_CARE_PROVIDER_SITE_OTHER): Payer: Medicare Other | Admitting: Internal Medicine

## 2017-06-01 ENCOUNTER — Encounter: Payer: Self-pay | Admitting: Internal Medicine

## 2017-06-01 DIAGNOSIS — E785 Hyperlipidemia, unspecified: Secondary | ICD-10-CM

## 2017-06-01 DIAGNOSIS — L821 Other seborrheic keratosis: Secondary | ICD-10-CM | POA: Diagnosis not present

## 2017-06-01 DIAGNOSIS — R269 Unspecified abnormalities of gait and mobility: Secondary | ICD-10-CM

## 2017-06-01 DIAGNOSIS — I2510 Atherosclerotic heart disease of native coronary artery without angina pectoris: Secondary | ICD-10-CM | POA: Diagnosis not present

## 2017-06-01 DIAGNOSIS — I10 Essential (primary) hypertension: Secondary | ICD-10-CM | POA: Diagnosis not present

## 2017-06-01 NOTE — Progress Notes (Signed)
Subjective:  Patient ID: [name], male    DOB: Jun 23, 1923  Age: 84 y.o. MRN: [id number]  CC: No chief complaint on file.   HPI [name] presents for HTN, CAD, GERD f/u  Outpatient Medications Prior to Visit  Medication Sig Dispense Refill  . aspirin 81 MG EC tablet Take 81 mg by mouth daily.      [name] b complex vitamins tablet Take 1 tablet by mouth daily.      . carvedilol (COREG) 6.25 MG tablet Take 1 tablet (6.25 mg total) by mouth 2 (two) times daily with a meal. 180 tablet 1  . Cholecalciferol 1000 UNITS tablet Take 1,000 Units by mouth daily.      . clopidogrel (PLAVIX) 75 MG tablet Take 1 tablet (75 mg total) by mouth daily. 30 tablet 2  . finasteride (PROSCAR) 5 MG tablet Take 1 tablet (5 mg total) by mouth daily. 90 tablet 1  . Glucosamine-Chondroit-Vit C-Mn (GLUCOSAMINE 1500 COMPLEX PO) Take 1 tablet by mouth 2 (two) times daily.     [name] lisinopril (PRINIVIL,ZESTRIL) 10 MG tablet Take 1 tablet (10 mg total) by mouth 2 (two) times daily. 60 tablet 11  . meclizine (ANTIVERT) 12.5 MG tablet Take 1 tablet (12.5 mg total) by mouth 3 (three) times daily as needed for dizziness. 60 tablet 1  . nitroGLYCERIN (NITROSTAT) 0.4 MG SL tablet Place 1 tablet (0.4 mg total) under the tongue every 5 (five) minutes as needed for chest pain (Please call your doctor/911 if no improvement after the second dose). 10 tablet 0  . pantoprazole (PROTONIX) 40 MG tablet Take 1 tablet (40 mg total) by mouth daily. 30 tablet 2  . polyethylene glycol (MIRALAX / GLYCOLAX) packet Take 17 g by mouth daily.    . simvastatin (ZOCOR) 80 MG tablet Take 80 mg by mouth daily.    . vitamin C (ASCORBIC ACID) 500 MG tablet Take 500 mg by mouth daily.     No facility-administered medications prior to visit.     ROS Review of Systems  Constitutional: Negative for appetite change, fatigue and unexpected weight change.  HENT: Negative for congestion, nosebleeds, sneezing, sore throat and trouble swallowing.     Eyes: Negative for itching and visual disturbance.  Respiratory: Negative for cough.   Cardiovascular: Negative for chest pain, palpitations and leg swelling.  Gastrointestinal: Negative for abdominal distention, blood in stool, diarrhea and nausea.  Genitourinary: Negative for frequency and hematuria.  Musculoskeletal: Positive for arthralgias. Negative for back pain, gait problem, joint swelling and neck pain.  Skin: Negative for rash.  Neurological: Negative for dizziness, tremors, speech difficulty and weakness.  Psychiatric/Behavioral: Negative for agitation, dysphoric mood and sleep disturbance. The patient is not nervous/anxious.     Objective:  BP 138/76 (BP Location: Left Arm, Patient Position: Sitting, Cuff Size: Large)   Pulse (!) 50   Temp 98 F (36.7 C) (Oral)   Ht 5\' 9"  (1.753 m)   Wt 155 lb (70.3 kg)   SpO2 98%   BMI 22.89 kg/m   BP Readings from Last 3 Encounters:  06/01/17 138/76  05/13/17 138/68  04/28/17 (!) 150/74    Wt Readings from Last 3 Encounters:  06/01/17 155 lb (70.3 kg)  05/13/17 158 lb (71.7 kg)  04/28/17 156 lb (70.8 kg)    Physical Exam  Constitutional: He is oriented to person, place, and time. He appears well-developed. No distress.  NAD  HENT:  Mouth/Throat: Oropharynx is clear and moist.  Eyes:  Pupils are equal, round, and reactive to light. Conjunctivae are normal.  Neck: Normal range of motion. No JVD present. No thyromegaly present.  Cardiovascular: Normal rate, regular rhythm, normal heart sounds and intact distal pulses. Exam reveals no gallop and no friction rub.  No murmur heard. Pulmonary/Chest: Effort normal and breath sounds normal. No respiratory distress. He has no wheezes. He has no rales. He exhibits no tenderness.  Abdominal: Soft. Bowel sounds are normal. He exhibits no distension and no mass. There is no tenderness. There is no rebound and no guarding.  Musculoskeletal: Normal range of motion. He exhibits no edema or  tenderness.  Lymphadenopathy:    He has no cervical adenopathy.  Neurological: He is alert and oriented to person, place, and time. He has normal reflexes. No cranial nerve deficit. He exhibits normal muscle tone. He displays a negative Romberg sign. Coordination and gait normal.  Skin: Skin is warm and dry. No rash noted.  Psychiatric: He has a normal mood and affect. His behavior is normal. Judgment and thought content normal.  SKs on skin  Lab Results  Component Value Date   WBC 8.6 04/22/2017   HGB 14.2 04/22/2017   HCT 42.5 04/22/2017   PLT 201 04/22/2017   GLUCOSE 121 (H) 04/22/2017   CHOL 159 04/22/2017   TRIG 68 04/22/2017   HDL 72 04/22/2017   LDLCALC 73 04/22/2017   ALT 14 05/03/2016   AST 22 05/03/2016   NA 137 04/22/2017   K 4.6 04/22/2017   CL 104 04/22/2017   CREATININE 1.11 04/22/2017   BUN 11 04/22/2017   CO2 22 04/22/2017   TSH 3.02 05/03/2016   PSA 6.45 (H) 08/07/2013   HGBA1C 6.4 12/05/2012    Dg Chest 2 View  Result Date: 04/22/2017 CLINICAL DATA:  Severe chest pain this morning EXAM: CHEST - 2 VIEW COMPARISON:  06/27/2012 FINDINGS: Cardiac shadow is stable. Aortic calcifications are again seen. The lungs are well aerated bilaterally. Degenerative changes of the thoracic spine are noted. IMPRESSION: No active cardiopulmonary disease. Electronically Signed   By: Inez Catalina M.D.   On: 04/22/2017 09:50   Dg Shoulder Left  Result Date: 04/22/2017 CLINICAL DATA:  Recent fall with left shoulder pain, initial encounter EXAM: LEFT SHOULDER - 2+ VIEW COMPARISON:  None. FINDINGS: Mild degenerative changes of the acromioclavicular joint are seen. No acute fracture or dislocation is noted. The underlying bony thorax is unremarkable. IMPRESSION: Mild degenerative change without acute abnormality. Electronically Signed   By: Inez Catalina M.D.   On: 04/22/2017 09:50   Ct Angio Chest Aorta W/cm &/or Wo/cm  Result Date: 04/22/2017 CLINICAL DATA:  Chest pain. EXAM: CT  ANGIOGRAPHY CHEST WITH CONTRAST TECHNIQUE: Multidetector CT imaging of the chest was performed using the standard protocol during bolus administration of intravenous contrast. Multiplanar CT image reconstructions and MIPs were obtained to evaluate the vascular anatomy. CONTRAST:  179mL ISOVUE-370 IOPAMIDOL (ISOVUE-370) INJECTION 76% COMPARISON:  Radiographs earlier this day.  No prior chest CT. FINDINGS: Cardiovascular: There are no filling defects within the pulmonary arteries to suggest pulmonary embolus. Aortic tortuosity with atherosclerosis. No dissection or aneurysm. Conventional branching pattern from the aortic arch with atherosclerosis at the great vessel origins. Coronary artery calcifications versus stents. Heart size upper normal. No pericardial effusion. Mediastinum/Nodes: No enlarged mediastinal or hilar lymph nodes. The esophagus is nondistended. No visualized thyroid nodule. Lungs/Pleura: Mild diffuse subpleural reticulation throughout both lungs without definite apical basilar gradient. No focal consolidation. No septal thickening or ground-glass opacities  to suggest pulmonary edema. Minimal perifissural atelectasis in the right major fissure. Trace biapical pleuroparenchymal scarring. No pleural effusion. No pulmonary mass. Upper Abdomen: Cyst in the upper left kidney measures 2.8 cm with thin calcified septa. Mild atherosclerosis of upper abdominal vasculature. Musculoskeletal: Multilevel degenerative change throughout the thoracic spine with mild exaggerated thoracic kyphosis. There are no acute or suspicious osseous abnormalities. Review of the MIP images confirms the above findings. IMPRESSION: 1. No pulmonary embolus, aortic dissection, or acute intrathoracic abnormality. 2. Minimal subpleural reticulation throughout both lungs may represent mild interstitial lung disease. 3. Aortic Atherosclerosis (ICD10-I70.0). Coronary artery calcifications/stents. 4. Bosniak 2 cyst in the left kidney  measuring 2.8 cm. No further workup or follow-up needed. Electronically Signed   By: Jeb Levering M.D.   On: 04/22/2017 21:51   Dg Hip Unilat W Or Wo Pelvis 2-3 Views Left  Result Date: 04/22/2017 CLINICAL DATA:  Recent fall with left hip pain, initial encounter EXAM: DG HIP (WITH OR WITHOUT PELVIS) 3V LEFT COMPARISON:  None. FINDINGS: Right hip replacement is noted. Pelvic ring is intact. Mild degenerative changes of the left hip joint are seen. No acute fracture or dislocation is noted. Diffuse vascular calcifications are seen. IMPRESSION: No acute abnormality noted. Electronically Signed   By: Inez Catalina M.D.   On: 04/22/2017 09:51    Assessment & Plan:   There are no diagnoses linked to this encounter. I am having Cecilie Lowers maintain his aspirin, b complex vitamins, Glucosamine-Chondroit-Vit C-Mn (GLUCOSAMINE 1500 COMPLEX PO), Cholecalciferol, polyethylene glycol, vitamin C, meclizine, lisinopril, carvedilol, nitroGLYCERIN, simvastatin, finasteride, clopidogrel, and pantoprazole.  No orders of the defined types were placed in this encounter.    Follow-up: No follow-ups on file.  Walker Kehr, MD

## 2017-06-01 NOTE — Assessment & Plan Note (Signed)
Try GLYTONE exfoliating body lotion by Pierre Fabre (free acid value 17.5)  

## 2017-06-01 NOTE — Assessment & Plan Note (Signed)
Lisinopril, Coreg 

## 2017-06-01 NOTE — Assessment & Plan Note (Signed)
Simvastatin 

## 2017-06-01 NOTE — Patient Instructions (Signed)
Try GLYTONE exfoliating body lotion by Pierre Fabre (free acid value 17.5)  

## 2017-06-01 NOTE — Assessment & Plan Note (Signed)
Plavix, ASA, Coreg, Simvastatin

## 2017-06-01 NOTE — Assessment & Plan Note (Signed)
Better  

## 2017-07-25 ENCOUNTER — Other Ambulatory Visit: Payer: Self-pay | Admitting: Internal Medicine

## 2017-07-25 NOTE — Telephone Encounter (Signed)
Copied from [place] [phone]. Topic: Quick Communication - Rx Refill/Question >> Jul 25, 2017  1:01 PM Yvette Rack wrote: Medication: meclizine (ANTIVERT) 12.5 MG tablet Has the patient contacted their pharmacy? Yes.  Pharmacy has called to request medicine to be sent there   They states [place] they [place] (Agent: If no, request that the patient contact the pharmacy for the refill.) (Agent: If yes, when and what did the pharmacy advise?)  Preferred Pharmacy (with phone number or street name): [place] [facility], [place] - [place] [phone] (Phone) [phone] (Fax)      Agent: Please be advised that RX refills may take up to 3 business days. We ask that you follow-up with your pharmacy.

## 2017-07-26 MED ORDER — MECLIZINE HCL 12.5 MG PO TABS: 12.5000 mg | ORAL_TABLET | Freq: Three times a day (TID) | ORAL | 1 refills | Status: AC | PRN

## 2017-07-26 NOTE — Telephone Encounter (Signed)
LOV 06/01/17 Dr. Alain Marion Last refill 10/12/16 # 60 with 1 refill

## 2017-07-26 NOTE — Telephone Encounter (Signed)
Reviewed chart pt is up-to-date sent refills to [facility].../lmb  

## 2017-08-12 DIAGNOSIS — Z85828 Personal history of other malignant neoplasm of skin: Secondary | ICD-10-CM | POA: Diagnosis not present

## 2017-08-12 DIAGNOSIS — L821 Other seborrheic keratosis: Secondary | ICD-10-CM | POA: Diagnosis not present

## 2017-08-12 DIAGNOSIS — D485 Neoplasm of uncertain behavior of skin: Secondary | ICD-10-CM | POA: Diagnosis not present

## 2017-08-12 DIAGNOSIS — D1801 Hemangioma of skin and subcutaneous tissue: Secondary | ICD-10-CM | POA: Diagnosis not present

## 2017-08-12 DIAGNOSIS — L820 Inflamed seborrheic keratosis: Secondary | ICD-10-CM | POA: Diagnosis not present

## 2017-08-15 ENCOUNTER — Telehealth: Payer: Self-pay | Admitting: Internal Medicine

## 2017-08-15 MED ORDER — CARVEDILOL 6.25 MG PO TABS: 6.2500 mg | ORAL_TABLET | Freq: Two times a day (BID) | ORAL | 1 refills | Status: DC

## 2017-08-15 NOTE — Telephone Encounter (Signed)
Copied from [place] #[id number]. Topic: Quick Communication - Rx Refill/Question >> Aug 15, 2017 10:08 AM Oliver Pila B wrote: Medication: carvedilol (COREG) 6.25 MG tablet [[id number]]   Has the patient contacted their pharmacy? Yes.   (Agent: If no, request that the patient contact the pharmacy for the refill.) (Agent: If yes, when and what did the pharmacy advise?)  Preferred Pharmacy (with phone number or street name): [facility]  Agent: Please be advised that RX refills may take up to 3 business days. We ask that you follow-up with your pharmacy.

## 2017-08-25 ENCOUNTER — Telehealth: Payer: Self-pay | Admitting: Internal Medicine

## 2017-08-25 NOTE — Telephone Encounter (Signed)
Copied from [place] [phone]. Topic: Quick Communication - Rx Refill/Question >> Aug 25, 2017 11:23 AM Bea Graff, NT wrote: Medication: clopidogrel (PLAVIX) 75 MG tablet   Has the patient contacted their pharmacy? Yes.   (Agent: If no, request that the patient contact the pharmacy for the refill.) (Agent: If yes, when and what did the pharmacy advise?)  Preferred Pharmacy (with phone number or street name): [place], [place] - [place] [phone] (Phone) [phone] (Fax)      Agent: Please be advised that RX refills may take up to 3 business days. We ask that you follow-up with your pharmacy.

## 2017-08-26 ENCOUNTER — Other Ambulatory Visit: Payer: Self-pay | Admitting: *Deleted

## 2017-08-26 DIAGNOSIS — I2510 Atherosclerotic heart disease of native coronary artery without angina pectoris: Secondary | ICD-10-CM

## 2017-08-26 DIAGNOSIS — I10 Essential (primary) hypertension: Secondary | ICD-10-CM

## 2017-08-26 MED ORDER — CLOPIDOGREL BISULFATE 75 MG PO TABS: 75.0000 mg | ORAL_TABLET | Freq: Every day | ORAL | 0 refills | Status: DC

## 2017-08-26 NOTE — Telephone Encounter (Signed)
Rx refilled per protocol- 330 with no additional- patient due follow up 8/19

## 2017-08-29 ENCOUNTER — Telehealth: Payer: Self-pay | Admitting: Internal Medicine

## 2017-08-29 MED ORDER — PANTOPRAZOLE SODIUM 40 MG PO TBEC: 40.0000 mg | DELAYED_RELEASE_TABLET | Freq: Every day | ORAL | 0 refills | Status: DC

## 2017-08-29 NOTE — Telephone Encounter (Signed)
Copied from [place] [phone]. Topic: Quick Communication - Rx Refill/Question >> Aug 29, 2017 10:39 AM Antonieta Iba C wrote: Medication: pantoprazole (PROTONIX) 40 MG tablet   Has the patient contacted their pharmacy? Yes -- per pharmacy they made pt aware that an apt is needed.   (Agent: If no, request that the patient contact the pharmacy for the refill.) (Agent: If yes, when and what did the pharmacy advise?)  Preferred Pharmacy (with phone number or street name): [place], [place] - [place] [phone] (Phone) [phone] (Fax)      Agent: Please be advised that RX refills may take up to 3 business days. We ask that you follow-up with your pharmacy.

## 2017-08-29 NOTE — Telephone Encounter (Signed)
Copied from [place] [phone]. Topic: Quick Communication - Rx Refill/Question >> Aug 29, 2017 10:39 AM Antonieta Iba C wrote: Medication: pantoprazole (PROTONIX) 40 MG tablet   Has the patient contacted their pharmacy? Yes -- per pharmacy they made pt aware that an apt is needed.   (Agent: If no, request that the patient contact the pharmacy for the refill.) (Agent: If yes, when and what did the pharmacy advise?)  Preferred Pharmacy (with phone number or street name): [facility], [place] - [place] [phone] (Phone) [phone] (Fax)      Agent: Please be advised that RX refills may take up to 3 business days. We ask that you follow-up with your pharmacy.

## 2017-09-13 ENCOUNTER — Ambulatory Visit (INDEPENDENT_AMBULATORY_CARE_PROVIDER_SITE_OTHER): Payer: Medicare Other | Admitting: Internal Medicine

## 2017-09-13 ENCOUNTER — Other Ambulatory Visit (INDEPENDENT_AMBULATORY_CARE_PROVIDER_SITE_OTHER): Payer: Medicare Other

## 2017-09-13 ENCOUNTER — Encounter: Payer: Self-pay | Admitting: Internal Medicine

## 2017-09-13 VITALS — BP 136/76 | HR 59 | Temp 98.3°F | Ht 69.0 in | Wt 155.0 lb

## 2017-09-13 DIAGNOSIS — I2510 Atherosclerotic heart disease of native coronary artery without angina pectoris: Secondary | ICD-10-CM

## 2017-09-13 DIAGNOSIS — I5020 Unspecified systolic (congestive) heart failure: Secondary | ICD-10-CM

## 2017-09-13 DIAGNOSIS — R269 Unspecified abnormalities of gait and mobility: Secondary | ICD-10-CM | POA: Diagnosis not present

## 2017-09-13 DIAGNOSIS — H9193 Unspecified hearing loss, bilateral: Secondary | ICD-10-CM

## 2017-09-13 LAB — CBC WITH DIFFERENTIAL/PLATELET
Basophils Absolute: 0 10*3/uL (ref 0.0–0.1)
Basophils Relative: 0.6 % (ref 0.0–3.0)
Eosinophils Absolute: 0.1 10*3/uL (ref 0.0–0.7)
Eosinophils Relative: 1.8 % (ref 0.0–5.0)
HCT: 40.3 % (ref 39.0–52.0)
Hemoglobin: 13.5 g/dL (ref 13.0–17.0)
Lymphocytes Relative: 25 % (ref 12.0–46.0)
Lymphs Abs: 2 10*3/uL (ref 0.7–4.0)
MCHC: 33.4 g/dL (ref 30.0–36.0)
MCV: 92.5 fl (ref 78.0–100.0)
Monocytes Absolute: 0.9 10*3/uL (ref 0.1–1.0)
Monocytes Relative: 10.9 % (ref 3.0–12.0)
Neutro Abs: 4.9 10*3/uL (ref 1.4–7.7)
Neutrophils Relative %: 61.7 % (ref 43.0–77.0)
Platelets: 202 10*3/uL (ref 150.0–400.0)
RBC: 4.36 Mil/uL (ref 4.22–5.81)
RDW: 14.4 % (ref 11.5–15.5)
WBC: 7.9 10*3/uL (ref 4.0–10.5)

## 2017-09-13 LAB — BASIC METABOLIC PANEL
BUN: 17 mg/dL (ref 6–23)
CO2: 28 mEq/L (ref 19–32)
Calcium: 9.5 mg/dL (ref 8.4–10.5)
Chloride: 103 mEq/L (ref 96–112)
Creatinine, Ser: 1.2 mg/dL (ref 0.40–1.50)
GFR: 60.18 mL/min (ref >60.00)
Glucose, Bld: 112 mg/dL — ABNORMAL HIGH (ref 70–99)
Potassium: 4.5 mEq/L (ref 3.5–5.1)
Sodium: 138 mEq/L (ref 135–145)

## 2017-09-13 LAB — HEPATIC FUNCTION PANEL
ALT: 13 U/L (ref 0–53)
AST: 22 U/L (ref 0–37)
Albumin: 3.9 g/dL (ref 3.5–5.2)
Alkaline Phosphatase: 45 U/L (ref 39–117)
Bilirubin, Direct: 0.2 mg/dL (ref 0.0–0.3)
Total Bilirubin: 1 mg/dL (ref 0.2–1.2)
Total Protein: 7.1 g/dL (ref 6.0–8.3)

## 2017-09-13 LAB — TSH: TSH: 3.66 u[IU]/mL (ref 0.35–4.50)

## 2017-09-13 NOTE — Assessment & Plan Note (Signed)
Plavix, ASA, Coreg, Simvastatin Labs No CP

## 2017-09-13 NOTE — Assessment & Plan Note (Addendum)
Coreg Lisinopril Labs

## 2017-09-13 NOTE — Assessment & Plan Note (Signed)
No wax Hearing aids

## 2017-09-13 NOTE — Assessment & Plan Note (Signed)
Walk w/care

## 2017-09-13 NOTE — Progress Notes (Signed)
Subjective:  Patient ID: [name], male    DOB: September 26, 1923  Age: 84 y.o. MRN: [id number]  CC: No chief complaint on file.   HPI WAINO MOUNSEY presents for hearing loss, HTN, CAD f/u  Outpatient Medications Prior to Visit  Medication Sig Dispense Refill  . aspirin 81 MG EC tablet Take 81 mg by mouth daily.      [name] b complex vitamins tablet Take 1 tablet by mouth daily.      . carvedilol (COREG) 6.25 MG tablet Take 1 tablet (6.25 mg total) by mouth 2 (two) times daily with a meal. 180 tablet 1  . Cholecalciferol 1000 UNITS tablet Take 1,000 Units by mouth daily.      . clopidogrel (PLAVIX) 75 MG tablet Take 1 tablet (75 mg total) by mouth daily. Patient needs appointment for additional refills 30 tablet 0  . finasteride (PROSCAR) 5 MG tablet Take 1 tablet (5 mg total) by mouth daily. 90 tablet 1  . Glucosamine-Chondroit-Vit C-Mn (GLUCOSAMINE 1500 COMPLEX PO) Take 1 tablet by mouth 2 (two) times daily.     [name] lisinopril (PRINIVIL,ZESTRIL) 10 MG tablet Take 1 tablet (10 mg total) by mouth 2 (two) times daily. 60 tablet 11  . meclizine (ANTIVERT) 12.5 MG tablet Take 1 tablet (12.5 mg total) by mouth 3 (three) times daily as needed for dizziness. 60 tablet 1  . nitroGLYCERIN (NITROSTAT) 0.4 MG SL tablet Place 1 tablet (0.4 mg total) under the tongue every 5 (five) minutes as needed for chest pain (Please call your doctor/911 if no improvement after the second dose). 10 tablet 0  . pantoprazole (PROTONIX) 40 MG tablet Take 1 tablet (40 mg total) by mouth daily. 30 tablet 0  . polyethylene glycol (MIRALAX / GLYCOLAX) packet Take 17 g by mouth daily.    . simvastatin (ZOCOR) 80 MG tablet Take 80 mg by mouth daily.    . vitamin C (ASCORBIC ACID) 500 MG tablet Take 500 mg by mouth daily.     No facility-administered medications prior to visit.     ROS: Review of Systems  Constitutional: Negative for appetite change, fatigue and unexpected weight change.  HENT: Negative for congestion,  nosebleeds, sneezing, sore throat and trouble swallowing.   Eyes: Negative for itching and visual disturbance.  Respiratory: Negative for cough.   Cardiovascular: Negative for chest pain, palpitations and leg swelling.  Gastrointestinal: Negative for abdominal distention, blood in stool, diarrhea and nausea.  Genitourinary: Negative for frequency and hematuria.  Musculoskeletal: Positive for arthralgias. Negative for back pain, gait problem, joint swelling and neck pain.  Skin: Negative for rash.  Neurological: Negative for dizziness, tremors, speech difficulty and weakness.  Psychiatric/Behavioral: Negative for agitation, dysphoric mood and sleep disturbance. The patient is not nervous/anxious.     Objective:  BP 136/76 (BP Location: Left Arm, Patient Position: Sitting, Cuff Size: Normal)   Pulse (!) 59   Temp 98.3 F (36.8 C) (Oral)   Ht 5\' 9"  (1.753 m)   Wt 155 lb (70.3 kg)   SpO2 96%   BMI 22.89 kg/m   BP Readings from Last 3 Encounters:  09/13/17 136/76  06/01/17 138/76  05/13/17 138/68    Wt Readings from Last 3 Encounters:  09/13/17 155 lb (70.3 kg)  06/01/17 155 lb (70.3 kg)  05/13/17 158 lb (71.7 kg)    Physical Exam  Constitutional: He is oriented to person, place, and time. He appears well-developed. No distress.  NAD  HENT:  Mouth/Throat: Oropharynx is  clear and moist.  Eyes: Pupils are equal, round, and reactive to light. Conjunctivae are normal.  Neck: Normal range of motion. No JVD present. No thyromegaly present.  Cardiovascular: Normal rate, regular rhythm, normal heart sounds and intact distal pulses. Exam reveals no gallop and no friction rub.  No murmur heard. Pulmonary/Chest: Effort normal and breath sounds normal. No respiratory distress. He has no wheezes. He has no rales. He exhibits no tenderness.  Abdominal: Soft. Bowel sounds are normal. He exhibits no distension and no mass. There is no tenderness. There is no rebound and no guarding.    Musculoskeletal: Normal range of motion. He exhibits no edema or tenderness.  Lymphadenopathy:    He has no cervical adenopathy.  Neurological: He is alert and oriented to person, place, and time. He has normal reflexes. No cranial nerve deficit. He exhibits normal muscle tone. He displays a negative Romberg sign. Coordination abnormal. Gait normal.  Skin: Skin is warm and dry. No rash noted.  Psychiatric: He has a normal mood and affect. His behavior is normal. Judgment and thought content normal.  ataxic  Lab Results  Component Value Date   WBC 8.6 04/22/2017   HGB 14.2 04/22/2017   HCT 42.5 04/22/2017   PLT 201 04/22/2017   GLUCOSE 121 (H) 04/22/2017   CHOL 159 04/22/2017   TRIG 68 04/22/2017   HDL 72 04/22/2017   LDLCALC 73 04/22/2017   ALT 14 05/03/2016   AST 22 05/03/2016   NA 137 04/22/2017   K 4.6 04/22/2017   CL 104 04/22/2017   CREATININE 1.11 04/22/2017   BUN 11 04/22/2017   CO2 22 04/22/2017   TSH 3.02 05/03/2016   PSA 6.45 (H) 08/07/2013   HGBA1C 6.4 12/05/2012    Dg Chest 2 View  Result Date: 04/22/2017 CLINICAL DATA:  Severe chest pain this morning EXAM: CHEST - 2 VIEW COMPARISON:  06/27/2012 FINDINGS: Cardiac shadow is stable. Aortic calcifications are again seen. The lungs are well aerated bilaterally. Degenerative changes of the thoracic spine are noted. IMPRESSION: No active cardiopulmonary disease. Electronically Signed   By: Inez Catalina M.D.   On: 04/22/2017 09:50   Dg Shoulder Left  Result Date: 04/22/2017 CLINICAL DATA:  Recent fall with left shoulder pain, initial encounter EXAM: LEFT SHOULDER - 2+ VIEW COMPARISON:  None. FINDINGS: Mild degenerative changes of the acromioclavicular joint are seen. No acute fracture or dislocation is noted. The underlying bony thorax is unremarkable. IMPRESSION: Mild degenerative change without acute abnormality. Electronically Signed   By: Inez Catalina M.D.   On: 04/22/2017 09:50   Ct Angio Chest Aorta W/cm &/or  Wo/cm  Result Date: 04/22/2017 CLINICAL DATA:  Chest pain. EXAM: CT ANGIOGRAPHY CHEST WITH CONTRAST TECHNIQUE: Multidetector CT imaging of the chest was performed using the standard protocol during bolus administration of intravenous contrast. Multiplanar CT image reconstructions and MIPs were obtained to evaluate the vascular anatomy. CONTRAST:  13mL ISOVUE-370 IOPAMIDOL (ISOVUE-370) INJECTION 76% COMPARISON:  Radiographs earlier this day.  No prior chest CT. FINDINGS: Cardiovascular: There are no filling defects within the pulmonary arteries to suggest pulmonary embolus. Aortic tortuosity with atherosclerosis. No dissection or aneurysm. Conventional branching pattern from the aortic arch with atherosclerosis at the great vessel origins. Coronary artery calcifications versus stents. Heart size upper normal. No pericardial effusion. Mediastinum/Nodes: No enlarged mediastinal or hilar lymph nodes. The esophagus is nondistended. No visualized thyroid nodule. Lungs/Pleura: Mild diffuse subpleural reticulation throughout both lungs without definite apical basilar gradient. No focal consolidation. No septal  thickening or ground-glass opacities to suggest pulmonary edema. Minimal perifissural atelectasis in the right major fissure. Trace biapical pleuroparenchymal scarring. No pleural effusion. No pulmonary mass. Upper Abdomen: Cyst in the upper left kidney measures 2.8 cm with thin calcified septa. Mild atherosclerosis of upper abdominal vasculature. Musculoskeletal: Multilevel degenerative change throughout the thoracic spine with mild exaggerated thoracic kyphosis. There are no acute or suspicious osseous abnormalities. Review of the MIP images confirms the above findings. IMPRESSION: 1. No pulmonary embolus, aortic dissection, or acute intrathoracic abnormality. 2. Minimal subpleural reticulation throughout both lungs may represent mild interstitial lung disease. 3. Aortic Atherosclerosis (ICD10-I70.0). Coronary  artery calcifications/stents. 4. Bosniak 2 cyst in the left kidney measuring 2.8 cm. No further workup or follow-up needed. Electronically Signed   By: Jeb Levering M.D.   On: 04/22/2017 21:51   Dg Hip Unilat W Or Wo Pelvis 2-3 Views Left  Result Date: 04/22/2017 CLINICAL DATA:  Recent fall with left hip pain, initial encounter EXAM: DG HIP (WITH OR WITHOUT PELVIS) 3V LEFT COMPARISON:  None. FINDINGS: Right hip replacement is noted. Pelvic ring is intact. Mild degenerative changes of the left hip joint are seen. No acute fracture or dislocation is noted. Diffuse vascular calcifications are seen. IMPRESSION: No acute abnormality noted. Electronically Signed   By: Inez Catalina M.D.   On: 04/22/2017 09:51    Assessment & Plan:   There are no diagnoses linked to this encounter.   No orders of the defined types were placed in this encounter.    Follow-up: No follow-ups on file.  Walker Kehr, MD

## 2017-09-22 ENCOUNTER — Telehealth: Payer: Self-pay | Admitting: Internal Medicine

## 2017-09-22 ENCOUNTER — Other Ambulatory Visit: Payer: Self-pay | Admitting: *Deleted

## 2017-09-22 DIAGNOSIS — I10 Essential (primary) hypertension: Secondary | ICD-10-CM

## 2017-09-22 DIAGNOSIS — I2510 Atherosclerotic heart disease of native coronary artery without angina pectoris: Secondary | ICD-10-CM

## 2017-09-22 MED ORDER — CLOPIDOGREL BISULFATE 75 MG PO TABS: 75.0000 mg | ORAL_TABLET | Freq: Every day | ORAL | 2 refills | Status: DC

## 2017-09-22 NOTE — Telephone Encounter (Signed)
Copied from [place] [id number]. Topic: Quick Communication - Rx Refill/Question >> Sep 22, 2017 11:47 AM Margot Ables wrote: Medication: clopidogrel (PLAVIX) 75 MG tablet, last fill 08/26/17 #30 0 refills, last OV 09/13/17 Has the patient contacted their pharmacy? Yes - pharmacy calling Preferred Pharmacy (with phone number or street name): [facility]

## 2017-09-26 ENCOUNTER — Telehealth: Payer: Self-pay | Admitting: Internal Medicine

## 2017-09-26 ENCOUNTER — Other Ambulatory Visit: Payer: Self-pay | Admitting: *Deleted

## 2017-09-26 MED ORDER — PANTOPRAZOLE SODIUM 40 MG PO TBEC: 40.0000 mg | DELAYED_RELEASE_TABLET | Freq: Every day | ORAL | 0 refills | Status: DC

## 2017-09-26 NOTE — Telephone Encounter (Signed)
Copied from [place] #[id number]. Topic: Quick Communication - Rx Refill/Question >> Sep 26, 2017 10:57 AM Mylinda Latina, NT wrote: Medication: pantoprazole (PROTONIX) 40 MG tablet  Has the patient contacted their pharmacy? No. [facility] pharmacy calling  (Agent: If no, request that the patient contact the pharmacy for the refill.) (Agent: If yes, when and what did the pharmacy advise?)  Preferred Pharmacy (with phone number or street name): [place], [place] [phone] (Phone) [phone] (Fax)    Agent: Please be advised that RX refills may take up to 3 business days. We ask that you follow-up with your pharmacy.

## 2017-10-04 ENCOUNTER — Other Ambulatory Visit: Payer: Self-pay | Admitting: Internal Medicine

## 2017-10-04 ENCOUNTER — Telehealth: Payer: Self-pay | Admitting: Cardiovascular Disease

## 2017-10-04 ENCOUNTER — Other Ambulatory Visit: Payer: Self-pay | Admitting: *Deleted

## 2017-10-04 MED ORDER — NITROGLYCERIN 0.4 MG SL SUBL: 0.4000 mg | SUBLINGUAL_TABLET | SUBLINGUAL | 0 refills | Status: DC | PRN

## 2017-10-04 NOTE — Telephone Encounter (Signed)
Spoke with patient's wife,per DPR, the patient has had right wrist pain for 2 weeks. He started using a walker 2 weeks ago. The patient took tylenol and the pain decreased. Patient denied SOB, chest pain, palpitations, dizziness or fatigue.  Advised the patient, the pain was not cardiac related. Advised the patient use heat and cold packs on the wrist to help with the pain. If pain persists to call primary care for further assistance. Patient's wife had no further questions.

## 2017-10-04 NOTE — Telephone Encounter (Signed)
Copied from [place] [id number]. Topic: Quick Communication - Rx Refill/Question >> Oct 04, 2017 12:26 PM Blase Mess A wrote: Medication: nitroGLYCERIN (NITROSTAT) 0.4 MG SL tablet [[id number]]   Pharmist called from [facility].  Previous script was written to [facility].  [facility] has been disolved and is now [facility].  Please send script  [facility]  Has the patient contacted their pharmacy? Yes  (Agent: If no, request that the patient contact the pharmacy for the refill.) (Agent: If yes, when and what did the pharmacy advise?)  Preferred Pharmacy (with phone number or street name):[facility], [place] [address] [phone]    Agent: Please be advised that RX refills may take up to 3 business days. We ask that you follow-up with your pharmacy.

## 2017-10-04 NOTE — Telephone Encounter (Signed)
New Message:      Pt's wife is calling and states the pt is having pain in his right arm in his wrist and she just wants to make sure this is not heart related

## 2017-10-04 NOTE — Telephone Encounter (Signed)
Reviewed chart pt is up-to-date sent refills to [facility].Marland KitchenAndee Poles

## 2017-10-04 NOTE — Telephone Encounter (Signed)
Rx refill request: nitroglycerin 0.4mg  SL tablet      Outside provider    Ordered on: 04/26/17  LOV: 09/13/17  PCP: Plotnikov  Pharmacy: [facility]

## 2017-10-05 ENCOUNTER — Telehealth: Payer: Self-pay | Admitting: Internal Medicine

## 2017-10-05 MED ORDER — NITROGLYCERIN 0.4 MG SL SUBL: 0.4000 mg | SUBLINGUAL_TABLET | SUBLINGUAL | 0 refills | Status: DC | PRN

## 2017-10-05 NOTE — Telephone Encounter (Signed)
Resent rx for corrected amount.Marland KitchenJohny Chess

## 2017-10-05 NOTE — Telephone Encounter (Signed)
Copied from [place]. Topic: Quick Communication - Rx Refill/Question >> Oct 05, 2017  7:51 AM Cecelia Byars, NT wrote: Medication:  nitroGLYCERIN (NITROSTAT) 0.4 MG SL tablet the pharmacist would like filled this for 25 tablets instead of 10 , she would like a call back this morning at  [phone] this morning  Has the patient contacted their pharmacy? yes  (Agent: If no, request that the patient contact the pharmacy for the refill. (Agent: If yes, when and what did the pharmacy advise  Preferred Pharmacy (with phone number or street name [place], [place] - [place] [phone] (Phone) fax [phone]    Agent: Please be advised that RX refills may take up to 3 business days. We ask that you follow-up with your pharmacy.

## 2017-10-11 MED ORDER — NITROGLYCERIN 0.4 MG SL SUBL: 0.4000 mg | SUBLINGUAL_TABLET | SUBLINGUAL | 0 refills | Status: DC | PRN

## 2017-10-11 NOTE — Addendum Note (Signed)
Addended by: Earnstine Regal on: 10/11/2017 09:30 AM   Modules accepted: Orders

## 2017-10-11 NOTE — Telephone Encounter (Signed)
Refaxed through [facility]...[name]

## 2017-10-11 NOTE — Telephone Encounter (Signed)
[facility] calling to see if the nitroGLYCERIN (NITROSTAT) 0.4 MG SL tablet  Can be resent electronically or refaxed. They did not receive the rx on 9/4.

## 2017-10-14 ENCOUNTER — Other Ambulatory Visit (INDEPENDENT_AMBULATORY_CARE_PROVIDER_SITE_OTHER): Payer: Medicare Other

## 2017-10-14 ENCOUNTER — Ambulatory Visit (INDEPENDENT_AMBULATORY_CARE_PROVIDER_SITE_OTHER): Payer: Medicare Other | Admitting: Internal Medicine

## 2017-10-14 ENCOUNTER — Ambulatory Visit (INDEPENDENT_AMBULATORY_CARE_PROVIDER_SITE_OTHER)
Admission: RE | Admit: 2017-10-14 | Discharge: 2017-10-14 | Disposition: A | Discharge status: Home / Self Care | Payer: Medicare Other | Source: Ambulatory Visit | Attending: Internal Medicine | Admitting: Internal Medicine

## 2017-10-14 ENCOUNTER — Encounter: Payer: Self-pay | Admitting: Internal Medicine

## 2017-10-14 DIAGNOSIS — M25531 Pain in right wrist: Secondary | ICD-10-CM

## 2017-10-14 DIAGNOSIS — I2510 Atherosclerotic heart disease of native coronary artery without angina pectoris: Secondary | ICD-10-CM | POA: Diagnosis not present

## 2017-10-14 LAB — URIC ACID: Uric Acid, Serum: 6.7 mg/dL (ref 4.0–7.8)

## 2017-10-14 MED ORDER — METHYLPREDNISOLONE ACETATE 80 MG/ML IJ SUSP
80.0000 mg | Freq: Once | INTRAMUSCULAR | Status: AC
  Administered 2017-10-14: 80 mg via INTRAMUSCULAR

## 2017-10-14 MED ORDER — METHYLPREDNISOLONE 4 MG PO TBPK: ORAL_TABLET | ORAL | 0 refills | Status: DC

## 2017-10-14 NOTE — Addendum Note (Signed)
Addended by: Karren Cobble on: 10/14/2017 02:18 PM   Modules accepted: Orders

## 2017-10-14 NOTE — Assessment & Plan Note (Signed)
Labs X ray Depo-medrol IM Medrol dose pack

## 2017-10-14 NOTE — Progress Notes (Signed)
Subjective:  Patient ID: [name], male    DOB: Jun 17, 1923  Age: 84 y.o. MRN: [id number]  CC: No chief complaint on file.   HPI [name] presents for R wrist pain started 2-3 weeks ago - no injury; he woke up w/it. Pain is 7/10 w/ROM  Outpatient Medications Prior to Visit  Medication Sig Dispense Refill  . aspirin 81 MG EC tablet Take 81 mg by mouth daily.      [name] b complex vitamins tablet Take 1 tablet by mouth daily.      . carvedilol (COREG) 6.25 MG tablet Take 1 tablet (6.25 mg total) by mouth 2 (two) times daily with a meal. 180 tablet 1  . Cholecalciferol 1000 UNITS tablet Take 1,000 Units by mouth daily.      . clopidogrel (PLAVIX) 75 MG tablet Take 1 tablet (75 mg total) by mouth daily. 30 tablet 2  . finasteride (PROSCAR) 5 MG tablet Take 1 tablet (5 mg total) by mouth daily. 90 tablet 1  . Glucosamine-Chondroit-Vit C-Mn (GLUCOSAMINE 1500 COMPLEX PO) Take 1 tablet by mouth 2 (two) times daily.     [name] lisinopril (PRINIVIL,ZESTRIL) 10 MG tablet Take 1 tablet (10 mg total) by mouth 2 (two) times daily. 60 tablet 11  . meclizine (ANTIVERT) 12.5 MG tablet Take 1 tablet (12.5 mg total) by mouth 3 (three) times daily as needed for dizziness. 60 tablet 1  . nitroGLYCERIN (NITROSTAT) 0.4 MG SL tablet Place 1 tablet (0.4 mg total) under the tongue every 5 (five) minutes as needed for chest pain (Please call your doctor/911 if no improvement after the second dose). 25 tablet 0  . pantoprazole (PROTONIX) 40 MG tablet Take 1 tablet (40 mg total) by mouth daily. 30 tablet 0  . polyethylene glycol (MIRALAX / GLYCOLAX) packet Take 17 g by mouth daily.    . simvastatin (ZOCOR) 80 MG tablet Take 80 mg by mouth daily.    . vitamin C (ASCORBIC ACID) 500 MG tablet Take 500 mg by mouth daily.     No facility-administered medications prior to visit.     ROS: Review of Systems  Constitutional: Negative for appetite change, fatigue and unexpected weight change.  HENT: Negative for  congestion, nosebleeds, sneezing, sore throat and trouble swallowing.   Eyes: Negative for itching and visual disturbance.  Respiratory: Negative for cough.   Cardiovascular: Negative for chest pain, palpitations and leg swelling.  Gastrointestinal: Negative for abdominal distention, blood in stool, diarrhea and nausea.  Genitourinary: Negative for frequency and hematuria.  Musculoskeletal: Positive for arthralgias. Negative for back pain, gait problem, joint swelling and neck pain.  Skin: Negative for rash.  Neurological: Negative for dizziness, tremors, speech difficulty and weakness.  Psychiatric/Behavioral: Negative for agitation, dysphoric mood and sleep disturbance. The patient is not nervous/anxious.     Objective:  BP 132/70 (BP Location: Left Arm, Patient Position: Sitting, Cuff Size: Normal)   Pulse 60   Temp 98 F (36.7 C) (Oral)   Ht 5\' 9"  (1.753 m)   Wt 154 lb (69.9 kg)   SpO2 95%   BMI 22.74 kg/m   BP Readings from Last 3 Encounters:  10/14/17 132/70  09/13/17 136/76  06/01/17 138/76    Wt Readings from Last 3 Encounters:  10/14/17 154 lb (69.9 kg)  09/13/17 155 lb (70.3 kg)  06/01/17 155 lb (70.3 kg)    Physical Exam  Constitutional: He is oriented to person, place, and time. He appears well-developed. No distress.  NAD  HENT:  Mouth/Throat: Oropharynx is clear and moist.  Eyes: Pupils are equal, round, and reactive to light. Conjunctivae are normal.  Neck: Normal range of motion. No JVD present. No thyromegaly present.  Cardiovascular: Normal rate, regular rhythm, normal heart sounds and intact distal pulses. Exam reveals no gallop and no friction rub.  No murmur heard. Pulmonary/Chest: Effort normal and breath sounds normal. No respiratory distress. He has no wheezes. He has no rales. He exhibits no tenderness.  Abdominal: Soft. Bowel sounds are normal. He exhibits no distension and no mass. There is no tenderness. There is no rebound and no guarding.    Musculoskeletal: Normal range of motion. He exhibits no edema or tenderness.  Lymphadenopathy:    He has no cervical adenopathy.  Neurological: He is alert and oriented to person, place, and time. He has normal reflexes. No cranial nerve deficit. He exhibits normal muscle tone. He displays a negative Romberg sign. Coordination and gait normal.  Skin: Skin is warm and dry. No rash noted.  Psychiatric: He has a normal mood and affect. His behavior is normal. Judgment and thought content normal.  R wrist is a little tender w/extreme ROM dorsally - no swelling  Lab Results  Component Value Date   WBC 7.9 09/13/2017   HGB 13.5 09/13/2017   HCT 40.3 09/13/2017   PLT 202.0 09/13/2017   GLUCOSE 112 (H) 09/13/2017   CHOL 159 04/22/2017   TRIG 68 04/22/2017   HDL 72 04/22/2017   LDLCALC 73 04/22/2017   ALT 13 09/13/2017   AST 22 09/13/2017   NA 138 09/13/2017   K 4.5 09/13/2017   CL 103 09/13/2017   CREATININE 1.20 09/13/2017   BUN 17 09/13/2017   CO2 28 09/13/2017   TSH 3.66 09/13/2017   PSA 6.45 (H) 08/07/2013   HGBA1C 6.4 12/05/2012    Dg Chest 2 View  Result Date: 04/22/2017 CLINICAL DATA:  Severe chest pain this morning EXAM: CHEST - 2 VIEW COMPARISON:  06/27/2012 FINDINGS: Cardiac shadow is stable. Aortic calcifications are again seen. The lungs are well aerated bilaterally. Degenerative changes of the thoracic spine are noted. IMPRESSION: No active cardiopulmonary disease. Electronically Signed   By: Inez Catalina M.D.   On: 04/22/2017 09:50   Dg Shoulder Left  Result Date: 04/22/2017 CLINICAL DATA:  Recent fall with left shoulder pain, initial encounter EXAM: LEFT SHOULDER - 2+ VIEW COMPARISON:  None. FINDINGS: Mild degenerative changes of the acromioclavicular joint are seen. No acute fracture or dislocation is noted. The underlying bony thorax is unremarkable. IMPRESSION: Mild degenerative change without acute abnormality. Electronically Signed   By: Inez Catalina M.D.   On:  04/22/2017 09:50   Ct Angio Chest Aorta W/cm &/or Wo/cm  Result Date: 04/22/2017 CLINICAL DATA:  Chest pain. EXAM: CT ANGIOGRAPHY CHEST WITH CONTRAST TECHNIQUE: Multidetector CT imaging of the chest was performed using the standard protocol during bolus administration of intravenous contrast. Multiplanar CT image reconstructions and MIPs were obtained to evaluate the vascular anatomy. CONTRAST:  153mL ISOVUE-370 IOPAMIDOL (ISOVUE-370) INJECTION 76% COMPARISON:  Radiographs earlier this day.  No prior chest CT. FINDINGS: Cardiovascular: There are no filling defects within the pulmonary arteries to suggest pulmonary embolus. Aortic tortuosity with atherosclerosis. No dissection or aneurysm. Conventional branching pattern from the aortic arch with atherosclerosis at the great vessel origins. Coronary artery calcifications versus stents. Heart size upper normal. No pericardial effusion. Mediastinum/Nodes: No enlarged mediastinal or hilar lymph nodes. The esophagus is nondistended. No visualized thyroid nodule. Lungs/Pleura: Mild  diffuse subpleural reticulation throughout both lungs without definite apical basilar gradient. No focal consolidation. No septal thickening or ground-glass opacities to suggest pulmonary edema. Minimal perifissural atelectasis in the right major fissure. Trace biapical pleuroparenchymal scarring. No pleural effusion. No pulmonary mass. Upper Abdomen: Cyst in the upper left kidney measures 2.8 cm with thin calcified septa. Mild atherosclerosis of upper abdominal vasculature. Musculoskeletal: Multilevel degenerative change throughout the thoracic spine with mild exaggerated thoracic kyphosis. There are no acute or suspicious osseous abnormalities. Review of the MIP images confirms the above findings. IMPRESSION: 1. No pulmonary embolus, aortic dissection, or acute intrathoracic abnormality. 2. Minimal subpleural reticulation throughout both lungs may represent mild interstitial lung disease.  3. Aortic Atherosclerosis (ICD10-I70.0). Coronary artery calcifications/stents. 4. Bosniak 2 cyst in the left kidney measuring 2.8 cm. No further workup or follow-up needed. Electronically Signed   By: Jeb Levering M.D.   On: 04/22/2017 21:51   Dg Hip Unilat W Or Wo Pelvis 2-3 Views Left  Result Date: 04/22/2017 CLINICAL DATA:  Recent fall with left hip pain, initial encounter EXAM: DG HIP (WITH OR WITHOUT PELVIS) 3V LEFT COMPARISON:  None. FINDINGS: Right hip replacement is noted. Pelvic ring is intact. Mild degenerative changes of the left hip joint are seen. No acute fracture or dislocation is noted. Diffuse vascular calcifications are seen. IMPRESSION: No acute abnormality noted. Electronically Signed   By: Inez Catalina M.D.   On: 04/22/2017 09:51    Assessment & Plan:   There are no diagnoses linked to this encounter.   No orders of the defined types were placed in this encounter.    Follow-up: No follow-ups on file.  Walker Kehr, MD

## 2017-10-18 ENCOUNTER — Telehealth: Payer: Self-pay | Admitting: Internal Medicine

## 2017-10-18 MED ORDER — PANTOPRAZOLE SODIUM 40 MG PO TBEC: 40.0000 mg | DELAYED_RELEASE_TABLET | Freq: Every day | ORAL | 0 refills | Status: DC

## 2017-10-18 NOTE — Telephone Encounter (Signed)
Copied from [place] [id number]. Topic: Quick Communication - Rx Refill/Question >> Oct 18, 2017 11:52 AM Sheran Luz wrote: Medication: pantoprazole (PROTONIX) 40 MG tablet [[id number]]   Baldo Ash, Pharmacist with [facility] is requesting this be sent electronically.   Preferred Pharmacy (with phone number or street name):  [facility], [place] - [place]  [phone] (Phone) [phone] (Fax)

## 2017-10-18 NOTE — Telephone Encounter (Signed)
pantoprazole refill Last Refill:09/26/17 # 30 Last OV: 06/01/17 PCP: Dr Alain Marion Pharmacy:[facility]

## 2017-11-08 ENCOUNTER — Other Ambulatory Visit: Payer: Self-pay | Admitting: Internal Medicine

## 2017-11-08 DIAGNOSIS — H2513 Age-related nuclear cataract, bilateral: Secondary | ICD-10-CM | POA: Diagnosis not present

## 2017-11-08 MED ORDER — FINASTERIDE 5 MG PO TABS: 5.0000 mg | ORAL_TABLET | Freq: Every day | ORAL | 2 refills | Status: DC

## 2017-11-08 NOTE — Telephone Encounter (Signed)
Requested Prescriptions  Pending Prescriptions Disp Refills  . finasteride (PROSCAR) 5 MG tablet 90 tablet 2    Sig: Take 1 tablet (5 mg total) by mouth daily.     Urology: 5-alpha Reductase Inhibitors Passed - 11/08/2017 10:18 AM      Passed - Valid encounter within last 12 months    Recent Outpatient Visits          3 weeks ago Wrist pain, acute, right   [place], MD   1 month ago Systolic congestive heart failure, unspecified HF chronicity ([place])   [place], MD   5 months ago Essential hypertension   [place], Evie Lacks, MD   8 months ago Essential hypertension   [facility], Evie Lacks, MD   1 year ago Encounter for [employer] annual wellness exam   [place], MD      Future Appointments            In 1 month Plotnikov, Evie Lacks, MD [place], [place]

## 2017-11-08 NOTE — Telephone Encounter (Signed)
Copied from [place] [id number]. Topic: Quick Communication - Rx Refill/Question >> Nov 08, 2017 10:03 AM Yvette Rack wrote: Medication: finasteride (PROSCAR) 5 MG tablet  Has the patient contacted their pharmacy? yes   Preferred Pharmacy (with phone number or street name): [place], [place] - [place] [phone] (Phone) [phone] (Fax)  Agent: Please be advised that RX refills may take up to 3 business days. We ask that you follow-up with your pharmacy.

## 2017-11-10 ENCOUNTER — Ambulatory Visit (INDEPENDENT_AMBULATORY_CARE_PROVIDER_SITE_OTHER): Payer: Medicare Other | Admitting: Cardiovascular Disease

## 2017-11-10 ENCOUNTER — Encounter: Payer: Self-pay | Admitting: Cardiovascular Disease

## 2017-11-10 ENCOUNTER — Encounter (INDEPENDENT_AMBULATORY_CARE_PROVIDER_SITE_OTHER): Payer: Self-pay

## 2017-11-10 VITALS — BP 122/70 | HR 51 | Ht 69.0 in | Wt 148.0 lb

## 2017-11-10 DIAGNOSIS — I2510 Atherosclerotic heart disease of native coronary artery without angina pectoris: Secondary | ICD-10-CM | POA: Diagnosis not present

## 2017-11-10 DIAGNOSIS — I10 Essential (primary) hypertension: Secondary | ICD-10-CM | POA: Diagnosis not present

## 2017-11-10 NOTE — Progress Notes (Signed)
Chief Complaint  Patient presents with  . Follow-up    CAD    History of Present Illness: 84 yo male with history of CAD, HTN and HLD here today for cardiac follow up. He had an inferior MI in 2002 and had a bare metal stent placed in the circumflex artery. He had unstable angina in August 2007 and had a severe mid LAD stenosis treated with a drug eluting stent. He was admitted to [facility] 04/22/17 with chest pain. Troponin was negative. Chest CTA without evidence of PE or aortic dissection. Echo 04/22/17 with LVEF=45-50%, mild LVH. Mild AI and MR. No chest pain at follow up visit in the office April 2019.   He is here today for follow up. The patient denies any chest pain, dyspnea, palpitations, lower extremity edema, orthopnea, PND, dizziness, near syncope or syncope.     Primary Care Physician: Cassandria Anger, MD  Past Medical History:  Diagnosis Date  . Coronary atherosclerosis of unspecified type of vessel, native or graft   . Dizziness and giddiness   . Hypertrophy of prostate without urinary obstruction and other lower urinary tract symptoms (LUTS)   . MI (myocardial infarction) ([place]) 2002  . Osteoarthrosis, unspecified whether generalized or localized, unspecified site   . Other and unspecified hyperlipidemia   . Shortness of breath dyspnea    with exertion  . Skin cancer    "top of his head"  . Thrombocytopenia, unspecified ([place])   . Unspecified essential hypertension     Past Surgical History:  Procedure Laterality Date  . COLONOSCOPY    . CORONARY ANGIOPLASTY  01/07/2001, 2008   2 stents  . ESOPHAGOGASTRODUODENOSCOPY N/A 04/22/2014   Procedure: ESOPHAGOGASTRODUODENOSCOPY (EGD);  Surgeon: Milus Banister, MD;  Location: [facility];  Service: Endoscopy;  Laterality: N/A;  . HIP ARTHROPLASTY Right 2006  . JOINT REPLACEMENT    . MOHS SURGERY     "top of his head"  . TRANSURETHRAL RESECTION OF PROSTATE      Current Outpatient Medications  Medication Sig Dispense  Refill  . aspirin 81 MG EC tablet Take 81 mg by mouth daily.      [name] b complex vitamins tablet Take 1 tablet by mouth daily.      . carvedilol (COREG) 6.25 MG tablet Take 1 tablet (6.25 mg total) by mouth 2 (two) times daily with a meal. 180 tablet 1  . Cholecalciferol 1000 UNITS tablet Take 1,000 Units by mouth daily.      . finasteride (PROSCAR) 5 MG tablet Take 1 tablet (5 mg total) by mouth daily. 90 tablet 2  . Glucosamine-Chondroit-Vit C-Mn (GLUCOSAMINE 1500 COMPLEX PO) Take 1 tablet by mouth 2 (two) times daily.     [name] lisinopril (PRINIVIL,ZESTRIL) 10 MG tablet Take 1 tablet (10 mg total) by mouth 2 (two) times daily. 60 tablet 11  . meclizine (ANTIVERT) 12.5 MG tablet Take 1 tablet (12.5 mg total) by mouth 3 (three) times daily as needed for dizziness. 60 tablet 1  . methylPREDNISolone (MEDROL DOSEPAK) 4 MG TBPK tablet As directed 21 tablet 0  . nitroGLYCERIN (NITROSTAT) 0.4 MG SL tablet Place 1 tablet (0.4 mg total) under the tongue every 5 (five) minutes as needed for chest pain (Please call your doctor/911 if no improvement after the second dose). 25 tablet 0  . pantoprazole (PROTONIX) 40 MG tablet Take 1 tablet (40 mg total) by mouth daily. 30 tablet 0  . polyethylene glycol (MIRALAX / GLYCOLAX) packet Take 17 g by mouth  daily.    . simvastatin (ZOCOR) 80 MG tablet Take 80 mg by mouth daily.    . vitamin C (ASCORBIC ACID) 500 MG tablet Take 500 mg by mouth daily.     No current facility-administered medications for this visit.     Allergies  Allergen Reactions  . Oxycodone Nausea And Vomiting  . Penicillins Hives    Social History   Socioeconomic History  . Marital status: Married    Spouse name: [name]  . Number of children: 2  . Years of education: Not on file  . Highest education level: Not on file  Occupational History  . Occupation: Retired    Fish farm manager: RETIRED  Social Needs  . Financial resource strain: Not on file  . Food insecurity:    Worry: Not on file     Inability: Not on file  . Transportation needs:    Medical: Not on file    Non-medical: Not on file  Tobacco Use  . Smoking status: Never Smoker  . Smokeless tobacco: Never Used  Substance and Sexual Activity  . Alcohol use: No    Alcohol/week: 0.0 standard drinks  . Drug use: No  . Sexual activity: Not Currently  Lifestyle  . Physical activity:    Days per week: Not on file    Minutes per session: Not on file  . Stress: Not on file  Relationships  . Social connections:    Talks on phone: Not on file    Gets together: Not on file    Attends religious service: Not on file    Active member of club or organization: Not on file    Attends meetings of clubs or organizations: Not on file    Relationship status: Not on file  . Intimate partner violence:    Fear of current or ex partner: Not on file    Emotionally abused: Not on file    Physically abused: Not on file    Forced sexual activity: Not on file  Other Topics Concern  . Not on file  Social History Narrative   Regular Exercise -  YES          Family History  Problem Relation Age of Onset  . Heart disease Father   . Coronary artery disease Other   . Colon cancer Neg Hx     Review of Systems:  As stated in the HPI and otherwise negative.   BP 122/70   Pulse (!) 51   Ht 5\' 9"  (1.753 m)   Wt 148 lb (67.1 kg)   SpO2 98%   BMI 21.86 kg/m   Physical Examination:  General: Well developed, well nourished, NAD  HEENT: OP clear, mucus membranes moist  SKIN: warm, dry. No rashes. Neuro: No focal deficits  Musculoskeletal: Muscle strength 5/5 all ext  Psychiatric: Mood and affect normal  Neck: No JVD, no carotid bruits, no thyromegaly, no lymphadenopathy.  Lungs:Clear bilaterally, no wheezes, rhonci, crackles Cardiovascular: Regular rate and rhythm. No murmurs, gallops or rubs. Abdomen:Soft. Bowel sounds present. Non-tender.  Extremities: No lower extremity edema. Pulses are 2 + in the bilateral DP/PT.  Echo  04/22/17: - Left ventricle: The cavity size was normal. Wall thickness was   increased in a pattern of mild LVH. Systolic function was mildly   reduced. The estimated ejection fraction was in the range of 45%   to 50%. There is akinesis of the basal-midinferoseptal   myocardium. Doppler parameters are consistent with abnormal left   ventricular relaxation (  grade 1 diastolic dysfunction). - Aortic valve: Trileaflet; mildly thickened, mildly calcified   leaflets. There was mild regurgitation. - Mitral valve: There was mild regurgitation. - Left atrium: The atrium was mildly dilated. - Pulmonary arteries: Systolic pressure was mildly increased. PA   peak pressure: 48 mm Hg (S).  EKG:  EKG is not ordered today. The ekg ordered today demonstrates   Recent Labs: 09/13/2017: ALT 13; BUN 17; Creatinine, Ser 1.20; Hemoglobin 13.5; Platelets 202.0; Potassium 4.5; Sodium 138; TSH 3.66   Lipid Panel    Component Value Date/Time   CHOL 159 04/22/2017 0900   TRIG 68 04/22/2017 0900   HDL 72 04/22/2017 0900   CHOLHDL 2.2 04/22/2017 0900   VLDL 14 04/22/2017 0900   LDLCALC 73 04/22/2017 0900     Wt Readings from Last 3 Encounters:  11/10/17 148 lb (67.1 kg)  10/14/17 154 lb (69.9 kg)  09/13/17 155 lb (70.3 kg)     Other studies Reviewed: Additional studies/ records that were reviewed today include: . Review of the above records demonstrates:    Assessment and Plan  1. CAD without angina: No chest pain. Continue ASA, statin and beta blocker. Will stop Plavix.    2. HTN: BP is controlled. No changes.   Current medicines are reviewed at length with the patient today.  The patient does not have concerns regarding medicines.  The following changes have been made:  no change  Labs/ tests ordered today include:   No orders of the defined types were placed in this encounter.   Disposition:   FU with me in 6  months  Signed, Lauree Chandler, MD 11/10/2017 12:07 PM    [facility] [address], [place], [place]  [address] Phone: [phone]; Fax: [phone]

## 2017-11-10 NOTE — Patient Instructions (Signed)
Medication Instructions:  Your physician has recommended you make the following change in your medication: Stop Plavix (clopidogrel)   If you need a refill on your cardiac medications before your next appointment, please call your pharmacy.   Lab work: none If you have labs (blood work) drawn today and your tests are completely normal, you will receive your results only by: [name] MyChart Message (if you have MyChart) OR . A paper copy in the mail If you have any lab test that is abnormal or we need to change your treatment, we will call you to review the results.  Testing/Procedures: none   Follow-Up: At [facility], you and your health needs are our priority.  As part of our continuing mission to provide you with exceptional heart care, we have created designated Provider Care Teams.  These Care Teams include your primary Cardiologist (physician) and Advanced Practice Providers (APPs -  Physician Assistants and Nurse Practitioners) who all work together to provide you with the care you need, when you need it. You will need a follow up appointment in 6 months.   Please call our office 2 months in advance to schedule this appointment.  You may see Lauree Chandler, MD  or one of the following Advanced Practice Providers on your designated Care Team:   [place], PA-C Melina Copa, PA-C . Ermalinda Barrios, PA-C  Any Other Special Instructions Will Be Listed Below (If Applicable).

## 2017-11-22 ENCOUNTER — Other Ambulatory Visit: Payer: Self-pay | Admitting: Internal Medicine

## 2017-11-22 MED ORDER — LISINOPRIL 10 MG PO TABS: 10.0000 mg | ORAL_TABLET | Freq: Two times a day (BID) | ORAL | 0 refills | Status: DC

## 2017-11-22 MED ORDER — SIMVASTATIN 80 MG PO TABS: 80.0000 mg | ORAL_TABLET | Freq: Every day | ORAL | 0 refills | Status: DC

## 2017-11-22 NOTE — Telephone Encounter (Signed)
Refill due? Yes Active Med list? Yes Last refill: 04/28/17 Historical med Future visit scheduled? Yes Meets protocol but unable to fill because it is historical

## 2017-11-22 NOTE — Addendum Note (Signed)
Addended by: Carlisle Beers on: 11/22/2017 02:28 PM   Modules accepted: Orders

## 2017-11-22 NOTE — Telephone Encounter (Signed)
Copied from [place] [id number]. Topic: Quick Communication - Rx Refill/Question >> Nov 22, 2017 11:27 AM Keene Breath wrote: Medication: lisinopril (PRINIVIL,ZESTRIL) 10 MG tablet / simvastatin (ZOCOR) 80 MG tablet  Patient called to request a refill for the above medications.  CB# [phone]  Preferred Pharmacy (with phone number or street name): [facility], [place] [phone] (Phone) [phone] (Fax)

## 2017-11-22 NOTE — Telephone Encounter (Signed)
Sending to CMA 

## 2017-11-25 ENCOUNTER — Telehealth: Payer: Self-pay | Admitting: Internal Medicine

## 2017-11-25 DIAGNOSIS — M25531 Pain in right wrist: Secondary | ICD-10-CM

## 2017-11-25 DIAGNOSIS — M79601 Pain in right arm: Secondary | ICD-10-CM

## 2017-11-25 NOTE — Telephone Encounter (Signed)
Copied from [place] [id number]. Topic: Quick Communication - See Telephone Encounter >> Nov 25, 2017  2:05 PM Vernona Rieger wrote: CRM for notification. See Telephone encounter for: 11/25/17.  Candi Leash, with [facility] called and stated that the patient had an xray on 10/14/17 and he stated that he never got the results. It was of his right wrist/arm. She states that he is having a lot of trouble with it. She said he is having a hard time brushing his teeth and just doing daily activities of living. She is wondering if she could get an order to get occupational therapy to evaluate him. Please fax to Fax number [phone], attention Candi Leash, she can be reached at [phone].

## 2017-11-28 NOTE — Telephone Encounter (Signed)
Referral placed and faxed.

## 2017-11-29 ENCOUNTER — Other Ambulatory Visit: Payer: Self-pay | Admitting: Internal Medicine

## 2017-11-29 DIAGNOSIS — Z23 Encounter for immunization: Secondary | ICD-10-CM | POA: Diagnosis not present

## 2017-11-29 MED ORDER — PANTOPRAZOLE SODIUM 40 MG PO TBEC: 40.0000 mg | DELAYED_RELEASE_TABLET | Freq: Every day | ORAL | 5 refills | Status: DC

## 2017-11-29 NOTE — Telephone Encounter (Signed)
Copied from [place] [phone]. Topic: Quick Communication - See Telephone Encounter >> Nov 29, 2017 11:20 AM Ahmed Prima L wrote: CRM for notification. See Telephone encounter for: 11/29/17.  pantoprazole (PROTONIX) 40 MG tablet  [facility], [place] [address] [place] [address] Phone: [phone] Fax: [phone]

## 2017-12-06 DIAGNOSIS — R2681 Unsteadiness on feet: Secondary | ICD-10-CM | POA: Diagnosis not present

## 2017-12-06 DIAGNOSIS — M6281 Muscle weakness (generalized): Secondary | ICD-10-CM | POA: Diagnosis not present

## 2017-12-07 DIAGNOSIS — R2681 Unsteadiness on feet: Secondary | ICD-10-CM | POA: Diagnosis not present

## 2017-12-07 DIAGNOSIS — M6281 Muscle weakness (generalized): Secondary | ICD-10-CM | POA: Diagnosis not present

## 2017-12-09 DIAGNOSIS — M6281 Muscle weakness (generalized): Secondary | ICD-10-CM | POA: Diagnosis not present

## 2017-12-09 DIAGNOSIS — R2681 Unsteadiness on feet: Secondary | ICD-10-CM | POA: Diagnosis not present

## 2017-12-15 ENCOUNTER — Ambulatory Visit (INDEPENDENT_AMBULATORY_CARE_PROVIDER_SITE_OTHER): Payer: Medicare Other | Admitting: Internal Medicine

## 2017-12-15 ENCOUNTER — Other Ambulatory Visit (INDEPENDENT_AMBULATORY_CARE_PROVIDER_SITE_OTHER): Payer: Medicare Other

## 2017-12-15 ENCOUNTER — Encounter: Payer: Self-pay | Admitting: Internal Medicine

## 2017-12-15 DIAGNOSIS — I2510 Atherosclerotic heart disease of native coronary artery without angina pectoris: Secondary | ICD-10-CM | POA: Diagnosis not present

## 2017-12-15 DIAGNOSIS — I5020 Unspecified systolic (congestive) heart failure: Secondary | ICD-10-CM

## 2017-12-15 DIAGNOSIS — R42 Dizziness and giddiness: Secondary | ICD-10-CM

## 2017-12-15 DIAGNOSIS — I10 Essential (primary) hypertension: Secondary | ICD-10-CM

## 2017-12-15 DIAGNOSIS — M6281 Muscle weakness (generalized): Secondary | ICD-10-CM | POA: Diagnosis not present

## 2017-12-15 DIAGNOSIS — R2681 Unsteadiness on feet: Secondary | ICD-10-CM | POA: Diagnosis not present

## 2017-12-15 DIAGNOSIS — N400 Enlarged prostate without lower urinary tract symptoms: Secondary | ICD-10-CM | POA: Diagnosis not present

## 2017-12-15 LAB — CBC WITH DIFFERENTIAL/PLATELET
Basophils Absolute: 0.1 10*3/uL (ref 0.0–0.1)
Basophils Relative: 0.8 % (ref 0.0–3.0)
Eosinophils Absolute: 0.2 10*3/uL (ref 0.0–0.7)
Eosinophils Relative: 2.9 % (ref 0.0–5.0)
HCT: 41.2 % (ref 39.0–52.0)
Hemoglobin: 13.8 g/dL (ref 13.0–17.0)
Lymphocytes Relative: 18.2 % (ref 12.0–46.0)
Lymphs Abs: 1.6 10*3/uL (ref 0.7–4.0)
MCHC: 33.6 g/dL (ref 30.0–36.0)
MCV: 93.8 fl (ref 78.0–100.0)
Monocytes Absolute: 0.9 10*3/uL (ref 0.1–1.0)
Monocytes Relative: 10.8 % (ref 3.0–12.0)
Neutro Abs: 5.8 10*3/uL (ref 1.4–7.7)
Neutrophils Relative %: 67.3 % (ref 43.0–77.0)
Platelets: 201 10*3/uL (ref 150.0–400.0)
RBC: 4.39 Mil/uL (ref 4.22–5.81)
RDW: 14.9 % (ref 11.5–15.5)
WBC: 8.6 10*3/uL (ref 4.0–10.5)

## 2017-12-15 LAB — BASIC METABOLIC PANEL
BUN: 22 mg/dL (ref 6–23)
CO2: 27 mEq/L (ref 19–32)
Calcium: 9.2 mg/dL (ref 8.4–10.5)
Chloride: 103 mEq/L (ref 96–112)
Creatinine, Ser: 1.44 mg/dL (ref 0.40–1.50)
GFR: 48.73 mL/min — ABNORMAL LOW (ref >60.00)
Glucose, Bld: 131 mg/dL — ABNORMAL HIGH (ref 70–99)
Potassium: 4.5 mEq/L (ref 3.5–5.1)
Sodium: 137 mEq/L (ref 135–145)

## 2017-12-15 LAB — TSH: TSH: 3.57 u[IU]/mL (ref 0.35–4.50)

## 2017-12-15 NOTE — Patient Instructions (Signed)
Try Lisinopril 1/2 tablet twice a day for a few days to see if dizziness is better

## 2017-12-15 NOTE — Assessment & Plan Note (Signed)
Lisinopril, Coreg 

## 2017-12-15 NOTE — Progress Notes (Signed)
Subjective:  Patient ID: [name], male    DOB: 05/23/1923  Age: 84 y.o. MRN: [id number]  CC: No chief complaint on file.   HPI [name] presents for CAD, HTN, BPH f/u C/o lightheadedness - dizzy  Outpatient Medications Prior to Visit  Medication Sig Dispense Refill  . aspirin 81 MG EC tablet Take 81 mg by mouth daily.      [name] b complex vitamins tablet Take 1 tablet by mouth daily.      . carvedilol (COREG) 6.25 MG tablet Take 1 tablet (6.25 mg total) by mouth 2 (two) times daily with a meal. 180 tablet 1  . Cholecalciferol 1000 UNITS tablet Take 1,000 Units by mouth daily.      . finasteride (PROSCAR) 5 MG tablet Take 1 tablet (5 mg total) by mouth daily. 90 tablet 2  . Glucosamine-Chondroit-Vit C-Mn (GLUCOSAMINE 1500 COMPLEX PO) Take 1 tablet by mouth 2 (two) times daily.     [name] lisinopril (PRINIVIL,ZESTRIL) 10 MG tablet Take 1 tablet (10 mg total) by mouth 2 (two) times daily. 60 tablet 0  . meclizine (ANTIVERT) 12.5 MG tablet Take 1 tablet (12.5 mg total) by mouth 3 (three) times daily as needed for dizziness. 60 tablet 1  . methylPREDNISolone (MEDROL DOSEPAK) 4 MG TBPK tablet As directed 21 tablet 0  . nitroGLYCERIN (NITROSTAT) 0.4 MG SL tablet Place 1 tablet (0.4 mg total) under the tongue every 5 (five) minutes as needed for chest pain (Please call your doctor/911 if no improvement after the second dose). 25 tablet 0  . pantoprazole (PROTONIX) 40 MG tablet Take 1 tablet (40 mg total) by mouth daily. 30 tablet 5  . polyethylene glycol (MIRALAX / GLYCOLAX) packet Take 17 g by mouth daily.    . simvastatin (ZOCOR) 80 MG tablet Take 1 tablet (80 mg total) by mouth daily. 90 tablet 0  . vitamin C (ASCORBIC ACID) 500 MG tablet Take 500 mg by mouth daily.     No facility-administered medications prior to visit.     ROS: Review of Systems  Constitutional: Negative for appetite change, fatigue and unexpected weight change.  HENT: Negative for congestion, nosebleeds,  sneezing, sore throat and trouble swallowing.   Eyes: Negative for itching and visual disturbance.  Respiratory: Negative for cough.   Cardiovascular: Negative for chest pain, palpitations and leg swelling.  Gastrointestinal: Negative for abdominal distention, blood in stool, diarrhea and nausea.  Genitourinary: Negative for frequency and hematuria.  Musculoskeletal: Negative for back pain, gait problem, joint swelling and neck pain.  Skin: Negative for rash.  Neurological: Negative for dizziness, tremors, speech difficulty and weakness.  Psychiatric/Behavioral: Positive for decreased concentration. Negative for agitation, dysphoric mood and sleep disturbance. The patient is not nervous/anxious.     Objective:  BP 126/68 (BP Location: Right Arm, Patient Position: Sitting, Cuff Size: Normal)   Pulse (!) 56   Temp 97.9 F (36.6 C) (Oral)   Ht 5\' 9"  (1.753 m)   Wt 152 lb (68.9 kg)   BMI 22.45 kg/m   BP Readings from Last 3 Encounters:  12/15/17 126/68  11/10/17 122/70  10/14/17 132/70    Wt Readings from Last 3 Encounters:  12/15/17 152 lb (68.9 kg)  11/10/17 148 lb (67.1 kg)  10/14/17 154 lb (69.9 kg)    Physical Exam  Constitutional: He is oriented to person, place, and time. He appears well-developed and well-nourished. No distress.  NAD  HENT:  Head: Normocephalic and atraumatic.  Right Ear: External  ear normal.  Left Ear: External ear normal.  Nose: Nose normal.  Mouth/Throat: Oropharynx is clear and moist. No oropharyngeal exudate.  Eyes: Pupils are equal, round, and reactive to light. Conjunctivae and EOM are normal. Right eye exhibits no discharge. Left eye exhibits no discharge. No scleral icterus.  Neck: Normal range of motion. Neck supple. No JVD present. No tracheal deviation present. No thyromegaly present.  Cardiovascular: Normal rate, regular rhythm, normal heart sounds and intact distal pulses. Exam reveals no gallop and no friction rub.  No murmur  heard. Pulmonary/Chest: Effort normal and breath sounds normal. No stridor. No respiratory distress. He has no wheezes. He has no rales. He exhibits no tenderness.  Abdominal: Soft. Bowel sounds are normal. He exhibits no distension and no mass. There is no tenderness. There is no rebound and no guarding.  Genitourinary: Rectal exam shows guaiac negative stool. No penile tenderness.  Musculoskeletal: Normal range of motion. He exhibits no edema or tenderness.  Lymphadenopathy:    He has no cervical adenopathy.  Neurological: He is alert and oriented to person, place, and time. He has normal reflexes. He displays normal reflexes. No cranial nerve deficit. He exhibits normal muscle tone. He displays a negative Romberg sign. Coordination and gait normal.  Skin: Skin is warm and dry. No rash noted. He is not diaphoretic. No erythema. No pallor.  Psychiatric: He has a normal mood and affect. His behavior is normal. Judgment and thought content normal.    Lab Results  Component Value Date   WBC 7.9 09/13/2017   HGB 13.5 09/13/2017   HCT 40.3 09/13/2017   PLT 202.0 09/13/2017   GLUCOSE 112 (H) 09/13/2017   CHOL 159 04/22/2017   TRIG 68 04/22/2017   HDL 72 04/22/2017   LDLCALC 73 04/22/2017   ALT 13 09/13/2017   AST 22 09/13/2017   NA 138 09/13/2017   K 4.5 09/13/2017   CL 103 09/13/2017   CREATININE 1.20 09/13/2017   BUN 17 09/13/2017   CO2 28 09/13/2017   TSH 3.66 09/13/2017   PSA 6.45 (H) 08/07/2013   HGBA1C 6.4 12/05/2012    Dg Wrist Complete Right  Result Date: 10/15/2017 CLINICAL DATA:  Right wrist pain for 2 weeks.  No known injury. EXAM: RIGHT WRIST - COMPLETE 3+ VIEW COMPARISON:  None. FINDINGS: There is no evidence of fracture or dislocation. There is no evidence of arthropathy or other focal bone abnormality. There are vascular calcifications. Soft tissues are unremarkable. IMPRESSION: Vascular calcifications, otherwise unremarkable radiographs of the wrist. Electronically  Signed   By: Keith Rake M.D.   On: 10/15/2017 01:07    Assessment & Plan:   Diagnoses and all orders for this visit:  Essential hypertension  Systolic congestive heart failure, unspecified HF chronicity ([place])  Benign prostatic hyperplasia without lower urinary tract symptoms     No orders of the defined types were placed in this encounter.    Follow-up: No follow-ups on file.  [facility] Kehr, MD

## 2017-12-15 NOTE — Assessment & Plan Note (Signed)
[place] 

## 2017-12-15 NOTE — Assessment & Plan Note (Signed)
Worse Try Lisinopril 1/2 tablet twice a day for a few days to see if better

## 2017-12-19 DIAGNOSIS — M6281 Muscle weakness (generalized): Secondary | ICD-10-CM | POA: Diagnosis not present

## 2017-12-19 DIAGNOSIS — R2681 Unsteadiness on feet: Secondary | ICD-10-CM | POA: Diagnosis not present

## 2017-12-27 ENCOUNTER — Other Ambulatory Visit: Payer: Self-pay | Admitting: Internal Medicine

## 2017-12-27 MED ORDER — LISINOPRIL 10 MG PO TABS: 10.0000 mg | ORAL_TABLET | Freq: Two times a day (BID) | ORAL | 1 refills | Status: DC

## 2017-12-27 NOTE — Telephone Encounter (Signed)
Copied from [place]. Topic: Quick Communication - Rx Refill/Question >> Dec 27, 2017 10:36 AM Burchel, Abbi R wrote: Medication: lisinopril (PRINIVIL,ZESTRIL) 10 MG tablet  Pharmacy request  Preferred Pharmacy: [facility], [address] [place] [place] [address] Phone: [phone] Fax: [phone]

## 2018-02-06 ENCOUNTER — Other Ambulatory Visit: Payer: Self-pay | Admitting: Internal Medicine

## 2018-02-06 NOTE — Telephone Encounter (Signed)
Copied from [place] [id number] [id number]. Topic: Quick Communication - Rx Refill/Question >> Feb 06, 2018 11:56 AM Bea Graff, NT wrote: Medication: finasteride (PROSCAR) 5 MG tablet   Has the patient contacted their pharmacy? Yes.   (Agent: If no, request that the patient contact the pharmacy for the refill.) (Agent: If yes, when and what did the pharmacy advise?)    Preferred Pharmacy (with phone number or street name): [facility], [place] [phone] (Phone) [phone] (Fax)  Agent: Please be advised that RX refills may take up to 3 business days. We ask that you follow-up with your pharmacy.

## 2018-02-14 ENCOUNTER — Other Ambulatory Visit: Payer: Self-pay | Admitting: Internal Medicine

## 2018-02-14 MED ORDER — CARVEDILOL 6.25 MG PO TABS: 6.2500 mg | ORAL_TABLET | Freq: Two times a day (BID) | ORAL | 0 refills | Status: DC

## 2018-02-14 NOTE — Telephone Encounter (Signed)
Requested Prescriptions  Pending Prescriptions Disp Refills  . carvedilol (COREG) 6.25 MG tablet 180 tablet 0    Sig: Take 1 tablet (6.25 mg total) by mouth 2 (two) times daily with a meal.     Cardiovascular:  Beta Blockers Passed - 02/14/2018 10:30 AM      Passed - Last BP in normal range    BP Readings from Last 1 Encounters:  12/15/17 126/68         Passed - Last Heart Rate in normal range    Pulse Readings from Last 1 Encounters:  12/15/17 (!) 56         Passed - Valid encounter within last 6 months    Recent Outpatient Visits          2 months ago Essential hypertension   [place], Evie Lacks, MD   4 months ago Wrist pain, acute, right   [facility] Plotnikov, Evie Lacks, MD   5 months ago Systolic congestive heart failure, unspecified HF chronicity ([place])   [facility] Plot[facility], Evie Lacks, MD   8 months ago Essential hypertension   [place], Evie Lacks, MD   11 months ago Essential hypertension   [facility], Evie Lacks, MD      Future Appointments            In 1 month Plotnikov, Evie Lacks, MD [place], [place]

## 2018-02-14 NOTE — Telephone Encounter (Signed)
Copied from [place] [id number]. Topic: Quick Communication - Rx Refill/Question >> Feb 14, 2018 10:24 AM Alanda Slim E wrote: Medication: carvedilol (COREG) 6.25 MG tablet  Has the patient contacted their pharmacy? Yes (Pharmacy called and made request)    Preferred Pharmacy (with phone number or street name): [facility], [place] - [place] [phone] (Phone) [phone] (Fax)    Agent: Please be advised that RX refills may take up to 3 business days. We ask that you follow-up with your pharmacy.

## 2018-02-23 ENCOUNTER — Other Ambulatory Visit: Payer: Self-pay | Admitting: Internal Medicine

## 2018-02-23 MED ORDER — SIMVASTATIN 80 MG PO TABS: 80.0000 mg | ORAL_TABLET | Freq: Every day | ORAL | 0 refills | Status: DC

## 2018-02-23 NOTE — Telephone Encounter (Signed)
Copied from [place] [phone]. Topic: Quick Communication - Rx Refill/Question >> Feb 23, 2018 10:35 AM Carolyn Stare wrote: Medication  simvastatin (ZOCOR) 80 MG tablet  Has the patient contacted their pharmacy yes    Preferred Pharmacy  [place]: Please be advised that RX refills may take up to 3 business days. We ask that you follow-up with your pharmacy.

## 2018-03-21 ENCOUNTER — Telehealth: Payer: Self-pay | Admitting: *Deleted

## 2018-03-21 NOTE — Telephone Encounter (Signed)
LVM for patient to call back in regards to scheduling AWV with our health coach.  

## 2018-03-22 ENCOUNTER — Ambulatory Visit (INDEPENDENT_AMBULATORY_CARE_PROVIDER_SITE_OTHER): Payer: Medicare Other | Admitting: Internal Medicine

## 2018-03-22 ENCOUNTER — Encounter: Payer: Self-pay | Admitting: Internal Medicine

## 2018-03-22 VITALS — BP 128/72 | HR 86 | Temp 97.8°F | Ht 69.0 in | Wt 154.0 lb

## 2018-03-22 DIAGNOSIS — R269 Unspecified abnormalities of gait and mobility: Secondary | ICD-10-CM

## 2018-03-22 DIAGNOSIS — I5020 Unspecified systolic (congestive) heart failure: Secondary | ICD-10-CM

## 2018-03-22 DIAGNOSIS — I10 Essential (primary) hypertension: Secondary | ICD-10-CM | POA: Diagnosis not present

## 2018-03-22 DIAGNOSIS — E785 Hyperlipidemia, unspecified: Secondary | ICD-10-CM | POA: Diagnosis not present

## 2018-03-22 NOTE — Assessment & Plan Note (Signed)
Lisinopril, Coreg 

## 2018-03-22 NOTE — Assessment & Plan Note (Signed)
[facility]

## 2018-03-22 NOTE — Progress Notes (Signed)
Subjective:  Patient ID: [name], male    DOB: 03-09-1923  Age: 85 y.o. MRN: [id number]  CC: No chief complaint on file.   HPI RAYFIELD BEEM presents for HTN, CAD, GERD f/u The pt had a food poisoning once  Outpatient Medications Prior to Visit  Medication Sig Dispense Refill  . aspirin 81 MG EC tablet Take 81 mg by mouth daily.      [name] b complex vitamins tablet Take 1 tablet by mouth daily.      . carvedilol (COREG) 6.25 MG tablet Take 1 tablet (6.25 mg total) by mouth 2 (two) times daily with a meal. 180 tablet 0  . Cholecalciferol 1000 UNITS tablet Take 1,000 Units by mouth daily.      . finasteride (PROSCAR) 5 MG tablet Take 1 tablet (5 mg total) by mouth daily. 90 tablet 2  . Glucosamine-Chondroit-Vit C-Mn (GLUCOSAMINE 1500 COMPLEX PO) Take 1 tablet by mouth 2 (two) times daily.     [name] lisinopril (PRINIVIL,ZESTRIL) 10 MG tablet Take 1 tablet (10 mg total) by mouth 2 (two) times daily. 180 tablet 1  . meclizine (ANTIVERT) 12.5 MG tablet Take 1 tablet (12.5 mg total) by mouth 3 (three) times daily as needed for dizziness. 60 tablet 1  . methylPREDNISolone (MEDROL DOSEPAK) 4 MG TBPK tablet As directed 21 tablet 0  . nitroGLYCERIN (NITROSTAT) 0.4 MG SL tablet Place 1 tablet (0.4 mg total) under the tongue every 5 (five) minutes as needed for chest pain (Please call your doctor/911 if no improvement after the second dose). 25 tablet 0  . pantoprazole (PROTONIX) 40 MG tablet Take 1 tablet (40 mg total) by mouth daily. 30 tablet 5  . polyethylene glycol (MIRALAX / GLYCOLAX) packet Take 17 g by mouth daily.    . simvastatin (ZOCOR) 80 MG tablet Take 1 tablet (80 mg total) by mouth daily. 90 tablet 0  . vitamin C (ASCORBIC ACID) 500 MG tablet Take 500 mg by mouth daily.     No facility-administered medications prior to visit.     ROS: Review of Systems  Constitutional: Positive for fatigue. Negative for appetite change and unexpected weight change.  HENT: Negative for congestion,  nosebleeds, sneezing, sore throat and trouble swallowing.   Eyes: Negative for itching and visual disturbance.  Respiratory: Negative for cough.   Cardiovascular: Negative for chest pain, palpitations and leg swelling.  Gastrointestinal: Negative for abdominal distention, blood in stool, diarrhea and nausea.  Genitourinary: Negative for frequency and hematuria.  Musculoskeletal: Positive for gait problem. Negative for back pain, joint swelling and neck pain.  Skin: Negative for rash.  Neurological: Negative for dizziness, tremors, speech difficulty and weakness.  Psychiatric/Behavioral: Negative for agitation, dysphoric mood, sleep disturbance and suicidal ideas. The patient is not nervous/anxious.     Objective:  BP 128/72 (BP Location: Left Arm, Patient Position: Sitting, Cuff Size: Normal)   Pulse 86   Temp 97.8 F (36.6 C) (Oral)   Ht 5\' 9"  (1.753 m)   Wt 154 lb (69.9 kg)   SpO2 (!) 86% Comment: cold fingers  BMI 22.74 kg/m   BP Readings from Last 3 Encounters:  03/22/18 128/72  12/15/17 126/68  11/10/17 122/70    Wt Readings from Last 3 Encounters:  03/22/18 154 lb (69.9 kg)  12/15/17 152 lb (68.9 kg)  11/10/17 148 lb (67.1 kg)    Physical Exam Constitutional:      General: He is not in acute distress.    Appearance: He is  well-developed.     Comments: NAD  Eyes:     Conjunctiva/sclera: Conjunctivae normal.     Pupils: Pupils are equal, round, and reactive to light.  Neck:     Musculoskeletal: Normal range of motion.     Thyroid: No thyromegaly.     Vascular: No JVD.  Cardiovascular:     Rate and Rhythm: Normal rate and regular rhythm.     Heart sounds: Normal heart sounds. No murmur. No friction rub. No gallop.   Pulmonary:     Effort: Pulmonary effort is normal. No respiratory distress.     Breath sounds: Normal breath sounds. No wheezing or rales.  Chest:     Chest wall: No tenderness.  Abdominal:     General: Bowel sounds are normal. There is no  distension.     Palpations: Abdomen is soft. There is no mass.     Tenderness: There is no abdominal tenderness. There is no guarding or rebound.  Musculoskeletal: Normal range of motion.        General: No tenderness.  Lymphadenopathy:     Cervical: No cervical adenopathy.  Skin:    General: Skin is warm and dry.     Findings: No rash.  Neurological:     Mental Status: He is alert and oriented to person, place, and time.     Cranial Nerves: No cranial nerve deficit.     Motor: No abnormal muscle tone.     Coordination: Coordination abnormal.     Gait: Gait normal.     Deep Tendon Reflexes: Reflexes are normal and symmetric.  Psychiatric:        Behavior: Behavior normal.        Thought Content: Thought content normal.        Judgment: Judgment normal.   ataxic, walker  Lab Results  Component Value Date   WBC 8.6 12/15/2017   HGB 13.8 12/15/2017   HCT 41.2 12/15/2017   PLT 201.0 12/15/2017   GLUCOSE 131 (H) 12/15/2017   CHOL 159 04/22/2017   TRIG 68 04/22/2017   HDL 72 04/22/2017   LDLCALC 73 04/22/2017   ALT 13 09/13/2017   AST 22 09/13/2017   NA 137 12/15/2017   K 4.5 12/15/2017   CL 103 12/15/2017   CREATININE 1.44 12/15/2017   BUN 22 12/15/2017   CO2 27 12/15/2017   TSH 3.57 12/15/2017   PSA 6.45 (H) 08/07/2013   HGBA1C 6.4 12/05/2012    Dg Wrist Complete Right  Result Date: 10/15/2017 CLINICAL DATA:  Right wrist pain for 2 weeks.  No known injury. EXAM: RIGHT WRIST - COMPLETE 3+ VIEW COMPARISON:  None. FINDINGS: There is no evidence of fracture or dislocation. There is no evidence of arthropathy or other focal bone abnormality. There are vascular calcifications. Soft tissues are unremarkable. IMPRESSION: Vascular calcifications, otherwise unremarkable radiographs of the wrist. Electronically Signed   By: Keith Rake M.D.   On: 10/15/2017 01:07    Assessment & Plan:   Diagnoses and all orders for this visit:  Essential hypertension  Systolic  congestive heart failure, unspecified HF chronicity ([place])     No orders of the defined types were placed in this encounter.    Follow-up: Return in about 3 months (around 06/20/2018) for a follow-up visit.  Walker Kehr, MD

## 2018-03-22 NOTE — Patient Instructions (Signed)
If you have medicare related insurance (such as traditional Medicare, Blue Cross Medicare, United HealthCare Medicare, or similar), Please make an appointment at the scheduling desk with Jill, the Wellness Health Coach, for your Wellness visit in this office, which is a benefit with your insurance.  

## 2018-03-22 NOTE — Assessment & Plan Note (Signed)
On Simvastatin 

## 2018-05-18 ENCOUNTER — Other Ambulatory Visit: Payer: Self-pay | Admitting: Internal Medicine

## 2018-05-18 MED ORDER — CARVEDILOL 6.25 MG PO TABS: 6.2500 mg | ORAL_TABLET | Freq: Two times a day (BID) | ORAL | 0 refills | Status: DC

## 2018-05-18 NOTE — Telephone Encounter (Signed)
Requested Prescriptions  Pending Prescriptions Disp Refills  . carvedilol (COREG) 6.25 MG tablet 180 tablet 0    Sig: Take 1 tablet (6.25 mg total) by mouth 2 (two) times daily with a meal.     Cardiovascular:  Beta Blockers Passed - 05/18/2018 11:17 AM      Passed - Last BP in normal range    BP Readings from Last 1 Encounters:  03/22/18 128/72         Passed - Last Heart Rate in normal range    Pulse Readings from Last 1 Encounters:  03/22/18 86         Passed - Valid encounter within last 6 months    Recent Outpatient Visits          1 month ago Essential hypertension   [place], Evie Lacks, MD   5 months ago Essential hypertension   [place] Plotnikov, Evie Lacks, MD   7 months ago Wrist pain, acute, right   [place], MD   8 months ago Systolic congestive heart failure, unspecified HF chronicity ([place])   [facility] Plot[facility], Evie Lacks, MD   11 months ago Essential hypertension   [place], MD      Future Appointments            In 1 month Plotnikov, Evie Lacks, MD [place], [place]

## 2018-05-22 ENCOUNTER — Ambulatory Visit: Payer: Medicare Other | Admitting: Cardiovascular Disease

## 2018-05-25 ENCOUNTER — Other Ambulatory Visit: Payer: Self-pay | Admitting: Internal Medicine

## 2018-05-25 ENCOUNTER — Ambulatory Visit: Payer: Medicare Other | Admitting: Cardiovascular Disease

## 2018-05-25 MED ORDER — SIMVASTATIN 80 MG PO TABS: 80.0000 mg | ORAL_TABLET | Freq: Every day | ORAL | 0 refills | Status: DC

## 2018-05-25 NOTE — Telephone Encounter (Signed)
Approved per protocol.  

## 2018-06-02 ENCOUNTER — Telehealth: Payer: Medicare Other | Admitting: Cardiovascular Disease

## 2018-06-12 ENCOUNTER — Telehealth: Payer: Self-pay | Admitting: Internal Medicine

## 2018-06-12 NOTE — Telephone Encounter (Signed)
Copied from [place] [phone]. Topic: Quick Communication - Rx Refill/Question >> Jun 12, 2018 10:42 AM Scherrie Gerlach wrote: Medication:  pantoprazole (PROTONIX) 40 MG tablet Pharmacy calling to request this refill  [place] [place] [facility] [place] [phone] (Phone) [phone] (Fax)

## 2018-06-13 MED ORDER — PANTOPRAZOLE SODIUM 40 MG PO TBEC: 40.0000 mg | DELAYED_RELEASE_TABLET | Freq: Every day | ORAL | 5 refills | Status: DC

## 2018-06-13 NOTE — Telephone Encounter (Signed)
RX sent

## 2018-06-29 ENCOUNTER — Ambulatory Visit: Payer: Medicare Other | Admitting: Internal Medicine

## 2018-07-12 ENCOUNTER — Telehealth: Payer: Self-pay | Admitting: Cardiovascular Disease

## 2018-07-12 NOTE — Telephone Encounter (Signed)
Anderson Malta with [employer] [facility] [phone] called to report the pt has been c/o not feeling well over the past 2 days.. he has had stomach pain and felt "off".... His BP today is 160/70 and HR has been 46-53... sh is asking to cut back on the Coreg... I advised her that it would be good to get a few days of BP and HR readings prior to making any med changes but I can send to Dr. Angelena Form for review.. she says they will monitor his BP and HR... he has been eating and drinking well but has been having the GI upset.. I asked her to talk with Dr. Alain Marion re: his stomach problems and maybe he can have a virtual visit with him asap. She agreed and is going to try calling him now.   No chest pain, sob, palpitations to her knowledge.

## 2018-07-12 NOTE — Telephone Encounter (Signed)
New Message    Pt c/o medication issue:  1. Name of Medication: Carvedilol  2. How are you currently taking this medication (dosage and times per day)? 6.25mg  1 tablet by mouth twice a day  3. Are you having a reaction (difficulty breathing--STAT)? NO  4. What is your medication issue?  [name] a Equities trader at [employer] stating the medication may need to be adjusted patient has been complaining of not feeling right.

## 2018-07-13 ENCOUNTER — Encounter: Payer: Self-pay | Admitting: Internal Medicine

## 2018-07-13 ENCOUNTER — Ambulatory Visit (INDEPENDENT_AMBULATORY_CARE_PROVIDER_SITE_OTHER): Payer: Medicare Other | Admitting: Internal Medicine

## 2018-07-13 DIAGNOSIS — I10 Essential (primary) hypertension: Secondary | ICD-10-CM

## 2018-07-13 DIAGNOSIS — R001 Bradycardia, unspecified: Secondary | ICD-10-CM | POA: Insufficient documentation

## 2018-07-13 DIAGNOSIS — I2510 Atherosclerotic heart disease of native coronary artery without angina pectoris: Secondary | ICD-10-CM | POA: Diagnosis not present

## 2018-07-13 DIAGNOSIS — R1084 Generalized abdominal pain: Secondary | ICD-10-CM | POA: Diagnosis not present

## 2018-07-13 DIAGNOSIS — R109 Unspecified abdominal pain: Secondary | ICD-10-CM | POA: Insufficient documentation

## 2018-07-13 MED ORDER — CARVEDILOL 3.125 MG PO TABS: 3.1250 mg | ORAL_TABLET | Freq: Two times a day (BID) | ORAL | 11 refills | Status: DC

## 2018-07-13 NOTE — Assessment & Plan Note (Signed)
No angina.  Continue with Plavix, aspirin, Coreg, simvastatin

## 2018-07-13 NOTE — Assessment & Plan Note (Signed)
Heart rate was in the 40s.  Will reduce Coreg to 3.125 mg daily.  Follow-up with cardiology

## 2018-07-13 NOTE — Assessment & Plan Note (Signed)
short, unclear etiology-resolved Will watch.

## 2018-07-13 NOTE — Assessment & Plan Note (Signed)
Lisinopril, Coreg

## 2018-07-13 NOTE — Assessment & Plan Note (Signed)
Lisinopril,  Coreg dose was reduced due to bradycardia

## 2018-07-13 NOTE — Progress Notes (Signed)
Virtual Visit via Video Note  I connected with ZYMIER RODGERS on 07/13/18 at  2:20 PM EDT by a video enabled telemedicine application and verified that I am speaking with the correct person using two identifiers.   I discussed the limitations of evaluation and management by telemedicine and the availability of in person appointments. The patient expressed understanding and agreed to proceed.  History of Present Illness: We need to follow-up on  congestive heart failure, hypertension, coronary disease.  The patient is with his nurse Anderson Malta.  She tells me that.  For his emergency department yesterday complaining of abdominal pain and dyspnea.  Apparently he started to worry about COVID 19.  The symptoms resolved shortly after she came.  He is feeling normal today  There has been no runny nose, cough, chest pain, diarrhea, constipation, arthralgias, skin rashes.   Observations/Objective: The patient appears to be in no acute distress, looks well.  Is hard of hearing.  No pallor His blood pressure is 132/60, temperature 98.2, O2 sat 95%, heart rate 50 Assessment and Plan:  See my Assessment and Plan. Follow Up Instructions:    I discussed the assessment and treatment plan with the patient. The patient was provided an opportunity to ask questions and all were answered. The patient agreed with the plan and demonstrated an understanding of the instructions.   The patient was advised to call back or seek an in-person evaluation if the symptoms worsen or if the condition fails to improve as anticipated.  I provided face-to-face time during this encounter. We were at different locations.   Walker Kehr, MD

## 2018-07-14 NOTE — Telephone Encounter (Signed)
Left a message for Anderson Malta [phone], nurse at patient's facility to record patient's BP and HR over the weekend after taking Coreg and report those to [place] Monday for further medication adjustment, if needed.

## 2018-07-14 NOTE — Telephone Encounter (Signed)
Agree. THanks, chris

## 2018-07-20 NOTE — Telephone Encounter (Signed)
I returned call and phone was answered by after hours provider.  He requested I call back tomorrow to speak with Anderson Malta.

## 2018-07-20 NOTE — Telephone Encounter (Signed)
[name] from [facility] is calling she stated she never received the message from 6/12. She stated they had a virtual visit with PCP Thursday 6/11, on Friday he started the new dose of 1/2 Coreg 3.125mg , patient's HR will not go above 52. [name] can be reached at [phone]

## 2018-07-21 ENCOUNTER — Telehealth: Payer: Self-pay | Admitting: *Deleted

## 2018-07-21 NOTE — Telephone Encounter (Signed)
I spoke with Anderson Malta. She reports she initially saw pt when he pushed emergency button indicating he was not feeling well.  He was having shortness of breath and abdominal pain at that time. Heart rate was 44.  Pt had telemedicine visit with primary care the next day and Coreg was decreased to 3.125 mg twice daily. Shortness of breath and abdominal pain have resolved.  Heart rate continues to run 48-52.  Anderson Malta does not see pt daily but has been seeing him 3 times per week and checking heart rate.  No BP readings available.  Heart rate reading is after taking Coreg. I told Anderson Malta I would send message to Dr. Angelena Form. She requests telemedicine visit for pt.  I told Anderson Malta I would look for appointment time and call her back I scheduled virtual visit with Ermalinda Barrios, PA for June 23,2020 at 8:45.  I left message on Jennifer's voicemail with appointment information and asked her to call me back to confirm.

## 2018-07-21 NOTE — Telephone Encounter (Signed)
I spoke with [name] and gave her appointment information.  We should call her [phone].  She will be with pt and have vital signs and med list available.

## 2018-07-21 NOTE — Telephone Encounter (Signed)
Thanks

## 2018-07-21 NOTE — Telephone Encounter (Signed)
Follow Up:; ° ° °Returning your call. °

## 2018-07-21 NOTE — Telephone Encounter (Signed)
I spoke with pt's wife and obtained consent for video visit on June 23,2020  Virtual Visit Pre-Appointment Phone Call  "(Name), I am calling you today to discuss your upcoming appointment. We are currently trying to limit exposure to the virus that causes COVID-19 by seeing patients at home rather than in the office."  1. "What is the BEST phone number to call the day of the visit?" - include this in appointment notes-- [phone]  2. "Do you have or have access to (through a family member/friend) a smartphone with video capability that we can use for your visit?" yes a. If yes - list this number in appt notes as "cell" (if different from BEST phone #) and list the appointment type as a VIDEO visit in appointment notes b. If no - list the appointment type as a PHONE visit in appointment notes  3. Confirm consent - "In the setting of the current Covid19 crisis, you are scheduled for a video visit with your provider on June 23,2020at 8:45.  Just as we do with many in-office visits, in order for you to participate in this visit, we must obtain consent.  If you'd like, I can send this to your mychart (if signed up) or email for you to review.  Otherwise, I can obtain your verbal consent now.  All virtual visits are billed to your insurance company just like a normal visit would be.  By agreeing to a virtual visit, we'd like you to understand that the technology does not allow for your provider to perform an examination, and thus may limit your provider's ability to fully assess your condition. If your provider identifies any concerns that need to be evaluated in person, we will make arrangements to do so.  Finally, though the technology is pretty good, we cannot assure that it will always work on either your or our end, and in the setting of a video visit, we may have to convert it to a phone-only visit.  In either situation, we cannot ensure that we have a secure connection.  Are you willing to  proceed?" STAFF: Did the patient verbally acknowledge consent to telehealth visit? Document YES/NO here: yes  4. Advise patient to be prepared - "Two hours prior to your appointment, go ahead and check your blood pressure, pulse, oxygen saturation, and your weight (if you have the equipment to check those) and write them all down. When your visit starts, your provider will ask you for this information. If you have an Apple Watch or Kardia device, please plan to have heart rate information ready on the day of your appointment. Please have a pen and paper handy nearby the day of the visit as well."  5. Give patient instructions for MyChart download to smartphone OR [email or web address] as below if video visit (depending on what platform provider is using)  6. Inform patient they will receive a phone call 15 minutes prior to their appointment time (may be from unknown caller ID) so they should be prepared to answer    TELEPHONE CALL NOTE  LARRI YEHLE has been deemed a candidate for a follow-up tele-health visit to limit community exposure during the Covid-19 pandemic. I spoke with the patient via phone to ensure availability of phone/video source, confirm preferred email & phone number, and discuss instructions and expectations.  I reminded DERIAN DIMALANTA to be prepared with any vital sign and/or heart rhythm information that could potentially be obtained via home monitoring, at the time of his  visit. I reminded JERRALD DOVERSPIKE to expect a phone call prior to his visit.  Leodis Liverpool, RN 07/21/2018 1:56 PM   INSTRUCTIONS FOR DOWNLOADING THE MYCHART APP TO SMARTPHONE  - The patient must first make sure to have activated MyChart and know their login information - If Apple, go to [employer] and type in MyChart in the search bar and download the app. If Android, ask patient to go to [employer] and type in [place] in the search bar and download the app. The app is free but as with any other app  downloads, their phone may require them to verify saved payment information or Apple/Android password.  - The patient will need to then log into the app with their MyChart username and password, and select [facility] as their healthcare provider to link the account. When it is time for your visit, go to the MyChart app, find appointments, and click Begin Video Visit. Be sure to Select Allow for your device to access the Microphone and Camera for your visit. You will then be connected, and your provider will be with you shortly.  **If they have any issues connecting, or need assistance please contact MyChart service desk [phone])**  **If using a computer, in order to ensure the best quality for their visit they will need to use either of the following Internet Browsers: [employer], or Google Chrome**  IF USING DOXIMITY or DOXY.ME - The patient will receive a link just prior to their visit by text.     FULL LENGTH CONSENT FOR TELE-HEALTH VISIT   I hereby voluntarily request, consent and authorize [facility] and its employed or contracted physicians, physician assistants, nurse practitioners or other licensed health care professionals (the Practitioner), to provide me with telemedicine health care services (the "Services") as deemed necessary by the treating Practitioner. I acknowledge and consent to receive the Services by the Practitioner via telemedicine. I understand that the telemedicine visit will involve communicating with the Practitioner through live audiovisual communication technology and the disclosure of certain medical information by electronic transmission. I acknowledge that I have been given the opportunity to request an in-person assessment or other available alternative prior to the telemedicine visit and am voluntarily participating in the telemedicine visit.  I understand that I have the right to withhold or withdraw my consent to the use of telemedicine  in the course of my care at any time, without affecting my right to future care or treatment, and that the Practitioner or I may terminate the telemedicine visit at any time. I understand that I have the right to inspect all information obtained and/or recorded in the course of the telemedicine visit and may receive copies of available information for a reasonable fee.  I understand that some of the potential risks of receiving the Services via telemedicine include:  [name] Delay or interruption in medical evaluation due to technological equipment failure or disruption; . Information transmitted may not be sufficient (e.g. poor resolution of images) to allow for appropriate medical decision making by the Practitioner; and/or  . In rare instances, security protocols could fail, causing a breach of personal health information.  Furthermore, I acknowledge that it is my responsibility to provide information about my medical history, conditions and care that is complete and accurate to the best of my ability. I acknowledge that Practitioner's advice, recommendations, and/or decision may be based on factors not within their control, such as incomplete or inaccurate data provided by me or  distortions of diagnostic images or specimens that may result from electronic transmissions. I understand that the practice of medicine is not an exact science and that Practitioner makes no warranties or guarantees regarding treatment outcomes. I acknowledge that I will receive a copy of this consent concurrently upon execution via email to the email address I last provided but may also request a printed copy by calling the office of [facility].    I understand that my insurance will be billed for this visit.   I have read or had this consent read to me. . I understand the contents of this consent, which adequately explains the benefits and risks of the Services being provided via telemedicine.  . I have been provided ample  opportunity to ask questions regarding this consent and the Services and have had my questions answered to my satisfaction. . I give my informed consent for the services to be provided through the use of telemedicine in my medical care  By participating in this telemedicine visit I agree to the above.

## 2018-07-21 NOTE — Telephone Encounter (Signed)
I placed call to pt to get consent for upcoming video visit. Left message to call office.

## 2018-07-24 NOTE — Progress Notes (Signed)
Virtual Visit via Video Note   This visit type was conducted due to national recommendations for restrictions regarding the COVID-19 Pandemic (e.g. social distancing) in an effort to limit this patient's exposure and mitigate transmission in our community.  Due to his co-morbid illnesses, this patient is at least at moderate risk for complications without adequate follow up.  This format is felt to be most appropriate for this patient at this time.  All issues noted in this document were discussed and addressed.  A limited physical exam was performed with this format.  Please refer to the patient's chart for his consent to telehealth for [facility].   Date:  07/25/2018   ID:  [name], DOB August 02, 1923, MRN [id number]  Patient Location: [facility] Provider Location: Office  PCP:  Cassandria Anger, MD  Cardiologist:  Lauree Chandler, MD  Electrophysiologist:  None   Evaluation Performed:  Follow-Up Visit  Chief Complaint:  bradycardia  History of Present Illness:    [name] is a 85 y.o. male with CAD S/P inf MI 2002 treated with BMS Cfx. USAP 2007 with severe mid LAD stenosis treated with DES. Chest pain 04/22/17 troponin negative, chest CTA without evidence of PE or aortic dissection. Echo 04/22/17 EF 45-50% mild LVH, mild AI & MR. Also has HTN.  Last saw Dr. Angelena Form 11/2017 no chest pain.   Patient complained of shortness of breath and abdominal pain, HR 44. Patient had telemedicine visit with PCP and Coreg decreased to 3.125 mg BID. Symptoms resolved and HR was still 48-52.   Patient feeling much better. Denies chest pain, dizziness, or abdominal pain.He's been on lower dose carvedilol about a week now. HR 46-54. Hasn't had his meds yet today.  The patient does not have symptoms concerning for COVID-19 infection (fever, chills, cough, or new shortness of breath).    Past Medical History:  Diagnosis Date   Coronary atherosclerosis of  unspecified type of vessel, native or graft    Dizziness and giddiness    Hypertrophy of prostate without urinary obstruction and other lower urinary tract symptoms (LUTS)    MI (myocardial infarction) ([place]) 2002   Osteoarthrosis, unspecified whether generalized or localized, unspecified site    Other and unspecified hyperlipidemia    Shortness of breath dyspnea    with exertion   Skin cancer    "top of his head"   Thrombocytopenia, unspecified ([place])    Unspecified essential hypertension    Past Surgical History:  Procedure Laterality Date   COLONOSCOPY     CORONARY ANGIOPLASTY  01/07/2001, 2008   2 stents   ESOPHAGOGASTRODUODENOSCOPY N/A 04/22/2014   Procedure: ESOPHAGOGASTRODUODENOSCOPY (EGD);  Surgeon: Milus Banister, MD;  Location: [facility];  Service: Endoscopy;  Laterality: N/A;   HIP ARTHROPLASTY Right 2006   JOINT REPLACEMENT     MOHS SURGERY     "top of his head"   TRANSURETHRAL RESECTION OF PROSTATE       Current Meds  Medication Sig   aspirin 81 MG EC tablet Take 81 mg by mouth daily.     b complex vitamins tablet Take 1 tablet by mouth daily.     carvedilol (COREG) 3.125 MG tablet Take 1 tablet (3.125 mg total) by mouth 2 (two) times daily with a meal.   Cholecalciferol 1000 UNITS tablet Take 1,000 Units by mouth daily.     finasteride (PROSCAR) 5 MG tablet Take 1 tablet (5 mg total) by mouth daily.   Glucosamine-Chondroit-Vit C-Mn (  GLUCOSAMINE 1500 COMPLEX PO) Take 1 tablet by mouth 2 (two) times daily.    lisinopril (PRINIVIL,ZESTRIL) 10 MG tablet Take 1 tablet (10 mg total) by mouth 2 (two) times daily. (Patient taking differently: Take 5 mg by mouth 2 (two) times daily. Take one half tablet by mouth twice daily.)   meclizine (ANTIVERT) 12.5 MG tablet Take 1 tablet (12.5 mg total) by mouth 3 (three) times daily as needed for dizziness.   methylPREDNISolone (MEDROL DOSEPAK) 4 MG TBPK tablet As directed   nitroGLYCERIN (NITROSTAT) 0.4 MG  SL tablet Place 1 tablet (0.4 mg total) under the tongue every 5 (five) minutes as needed for chest pain (Please call your doctor/911 if no improvement after the second dose).   pantoprazole (PROTONIX) 40 MG tablet Take 1 tablet (40 mg total) by mouth daily.   polyethylene glycol (MIRALAX / GLYCOLAX) packet Take 17 g by mouth daily.   simvastatin (ZOCOR) 80 MG tablet Take 1 tablet (80 mg total) by mouth daily.   vitamin C (ASCORBIC ACID) 500 MG tablet Take 500 mg by mouth daily.     Allergies:   Oxycodone and Penicillins   Social History   Tobacco Use   Smoking status: Never Smoker   Smokeless tobacco: Never Used  Substance Use Topics   Alcohol use: No    Alcohol/week: 0.0 standard drinks   Drug use: No     Family Hx: The patient's family history includes Coronary artery disease in an other family member; Heart disease in his father. There is no history of Colon cancer.  ROS:   Please see the history of present illness.     All other systems reviewed and are negative.   Prior CV studies:   The following studies were reviewed today:   Echo 04/22/17: - Left ventricle: The cavity size was normal. Wall thickness was   increased in a pattern of mild LVH. Systolic function was mildly   reduced. The estimated ejection fraction was in the range of 45%   to 50%. There is akinesis of the basal-midinferoseptal   myocardium. Doppler parameters are consistent with abnormal left   ventricular relaxation (grade 1 diastolic dysfunction). - Aortic valve: Trileaflet; mildly thickened, mildly calcified   leaflets. There was mild regurgitation. - Mitral valve: There was mild regurgitation. - Left atrium: The atrium was mildly dilated. - Pulmonary arteries: Systolic pressure was mildly increased. PA   peak pressure: 48 mm Hg (S).     Labs/Other Tests and Data Reviewed:    EKG:  An ECG dated 04/23/17 was personally reviewed today and demonstrated:  NSR left  Recent  Labs: 09/13/2017: ALT 13 12/15/2017: BUN 22; Creatinine, Ser 1.44; Hemoglobin 13.8; Platelets 201.0; Potassium 4.5; Sodium 137; TSH 3.57   Recent Lipid Panel Lab Results  Component Value Date/Time   CHOL 159 04/22/2017 09:00 AM   TRIG 68 04/22/2017 09:00 AM   HDL 72 04/22/2017 09:00 AM   CHOLHDL 2.2 04/22/2017 09:00 AM   LDLCALC 73 04/22/2017 09:00 AM    Wt Readings from Last 3 Encounters:  07/25/18 145 lb (65.8 kg)  03/22/18 154 lb (69.9 kg)  12/15/17 152 lb (68.9 kg)     Objective:    Vital Signs:  BP 122/60    Pulse (!) 54    Ht 5\' 9"  (1.753 m)    Wt 145 lb (65.8 kg)    BMI 21.41 kg/m    VITAL SIGNS:  reviewed GEN:  no acute distress RESPIRATORY:  normal respiratory effort,  symmetric expansion CARDIOVASCULAR:  no peripheral edema  ASSESSMENT & PLAN:    1. CAD S/P inf MI 2002 treated with BMS Cfx, DES LAD 2007-no angina 2. Ischemic CM EF45-50% mild LVH no CHF symptoms 3. HTN well controlled 4. Bradycardia even on low dose coreg-will stop completely-has appt with PCP tomorrow who can get an ekg. F/u with me in 1 month.  COVID-19 Education: The signs and symptoms of COVID-19 were discussed with the patient and how to seek care for testing (follow up with PCP or arrange E-visit).   The importance of social distancing was discussed today.  Time:   Today, I have spent 10  minutes with the patient with telehealth technology discussing the above problems.     Medication Adjustments/Labs and Tests Ordered: Current medicines are reviewed at length with the patient today.  Concerns regarding medicines are outlined above.   Tests Ordered: No orders of the defined types were placed in this encounter.   Medication Changes: No orders of the defined types were placed in this encounter.   Follow Up:  Virtual Visit in 4 week(s) Ermalinda Barrios PA-C  Signed, Ermalinda Barrios, PA-C  07/25/2018 9:11 AM    [place]

## 2018-07-25 ENCOUNTER — Telehealth (INDEPENDENT_AMBULATORY_CARE_PROVIDER_SITE_OTHER): Payer: Medicare Other | Admitting: Physician Assistant

## 2018-07-25 ENCOUNTER — Other Ambulatory Visit: Payer: Self-pay

## 2018-07-25 ENCOUNTER — Encounter: Payer: Self-pay | Admitting: Physician Assistant

## 2018-07-25 VITALS — BP 122/60 | HR 54 | Ht 69.0 in | Wt 145.0 lb

## 2018-07-25 DIAGNOSIS — I2510 Atherosclerotic heart disease of native coronary artery without angina pectoris: Secondary | ICD-10-CM

## 2018-07-25 DIAGNOSIS — I255 Ischemic cardiomyopathy: Secondary | ICD-10-CM

## 2018-07-25 DIAGNOSIS — I10 Essential (primary) hypertension: Secondary | ICD-10-CM

## 2018-07-25 DIAGNOSIS — R001 Bradycardia, unspecified: Secondary | ICD-10-CM

## 2018-07-25 MED ORDER — LISINOPRIL 10 MG PO TABS: 5.0000 mg | ORAL_TABLET | Freq: Two times a day (BID) | ORAL | 3 refills | Status: DC

## 2018-07-25 NOTE — Patient Instructions (Addendum)
Medication Instructions:  Your physician has recommended you make the following change in your medication:   STOP: carvedilol (coreg)   Lab work: None Ordered  If you have labs (blood work) drawn today and your tests are completely normal, you will receive your results only by: [name] MyChart Message (if you have MyChart) OR . A paper copy in the mail If you have any lab test that is abnormal or we need to change your treatment, we will call you to review the results.  Testing/Procedures: None ordered  Follow-Up: . Follow up with Ermalinda Barrios, PA via VIDEO Visit on 08/22/18 at 1:00 PM  Any Other Special Instructions Will Be Listed Below (If Applicable).

## 2018-07-26 ENCOUNTER — Other Ambulatory Visit (INDEPENDENT_AMBULATORY_CARE_PROVIDER_SITE_OTHER): Payer: Medicare Other

## 2018-07-26 ENCOUNTER — Ambulatory Visit (INDEPENDENT_AMBULATORY_CARE_PROVIDER_SITE_OTHER): Payer: Medicare Other | Admitting: Internal Medicine

## 2018-07-26 ENCOUNTER — Other Ambulatory Visit: Payer: Self-pay

## 2018-07-26 ENCOUNTER — Encounter: Payer: Self-pay | Admitting: Internal Medicine

## 2018-07-26 DIAGNOSIS — I10 Essential (primary) hypertension: Secondary | ICD-10-CM

## 2018-07-26 DIAGNOSIS — E785 Hyperlipidemia, unspecified: Secondary | ICD-10-CM

## 2018-07-26 DIAGNOSIS — I5020 Unspecified systolic (congestive) heart failure: Secondary | ICD-10-CM | POA: Diagnosis not present

## 2018-07-26 DIAGNOSIS — I255 Ischemic cardiomyopathy: Secondary | ICD-10-CM | POA: Diagnosis not present

## 2018-07-26 DIAGNOSIS — F4321 Adjustment disorder with depressed mood: Secondary | ICD-10-CM

## 2018-07-26 DIAGNOSIS — I2510 Atherosclerotic heart disease of native coronary artery without angina pectoris: Secondary | ICD-10-CM | POA: Diagnosis not present

## 2018-07-26 LAB — BASIC METABOLIC PANEL
BUN: 17 mg/dL (ref 6–23)
CO2: 31 mEq/L (ref 19–32)
Calcium: 9.3 mg/dL (ref 8.4–10.5)
Chloride: 101 mEq/L (ref 96–112)
Creatinine, Ser: 1.1 mg/dL (ref 0.40–1.50)
GFR: 62.48 mL/min (ref >60.00)
Glucose, Bld: 102 mg/dL — ABNORMAL HIGH (ref 70–99)
Potassium: 4.9 mEq/L (ref 3.5–5.1)
Sodium: 138 mEq/L (ref 135–145)

## 2018-07-26 NOTE — Progress Notes (Signed)
Subjective:  Patient ID: [name], male    DOB: [birth year]  Age: 85 y.o. MRN: [id number]  CC: No chief complaint on file.   HPI [name] presents for HTN, CAD,GERD f/u  Outpatient Medications Prior to Visit  Medication Sig Dispense Refill  . aspirin 81 MG EC tablet Take 81 mg by mouth daily.      [name] b complex vitamins tablet Take 1 tablet by mouth daily.      . Cholecalciferol 1000 UNITS tablet Take 1,000 Units by mouth daily.      . finasteride (PROSCAR) 5 MG tablet Take 1 tablet (5 mg total) by mouth daily. 90 tablet 2  . Glucosamine-Chondroit-Vit C-Mn (GLUCOSAMINE 1500 COMPLEX PO) Take 1 tablet by mouth 2 (two) times daily.     [name] lisinopril (ZESTRIL) 10 MG tablet Take 0.5 tablets (5 mg total) by mouth 2 (two) times daily. 90 tablet 3  . meclizine (ANTIVERT) 12.5 MG tablet Take 1 tablet (12.5 mg total) by mouth 3 (three) times daily as needed for dizziness. 60 tablet 1  . methylPREDNISolone (MEDROL DOSEPAK) 4 MG TBPK tablet As directed 21 tablet 0  . nitroGLYCERIN (NITROSTAT) 0.4 MG SL tablet Place 1 tablet (0.4 mg total) under the tongue every 5 (five) minutes as needed for chest pain (Please call your doctor/911 if no improvement after the second dose). 25 tablet 0  . pantoprazole (PROTONIX) 40 MG tablet Take 1 tablet (40 mg total) by mouth daily. 30 tablet 5  . polyethylene glycol (MIRALAX / GLYCOLAX) packet Take 17 g by mouth daily.    . simvastatin (ZOCOR) 80 MG tablet Take 1 tablet (80 mg total) by mouth daily. 90 tablet 0  . vitamin C (ASCORBIC ACID) 500 MG tablet Take 500 mg by mouth daily.     No facility-administered medications prior to visit.     ROS: Review of Systems  Constitutional: Positive for fatigue. Negative for appetite change and unexpected weight change.  HENT: Negative for congestion, nosebleeds, sneezing, sore throat and trouble swallowing.   Eyes: Negative for itching and visual disturbance.  Respiratory: Negative for cough.   Cardiovascular:  Negative for chest pain, palpitations and leg swelling.  Gastrointestinal: Negative for abdominal distention, blood in stool, diarrhea and nausea.  Genitourinary: Negative for frequency and hematuria.  Musculoskeletal: Positive for arthralgias, back pain and gait problem. Negative for joint swelling and neck pain.  Skin: Negative for rash.  Neurological: Negative for dizziness, tremors, speech difficulty and weakness.  Psychiatric/Behavioral: Negative for agitation, dysphoric mood, sleep disturbance and suicidal ideas. The patient is not nervous/anxious.     Objective:  BP 126/64 (BP Location: Left Arm, Patient Position: Sitting, Cuff Size: Normal)   Pulse (!) 54   Temp 97.8 F (36.6 C) (Oral)   Ht 5\' 9"  (1.753 m)   Wt 146 lb (66.2 kg)   SpO2 93%   BMI 21.56 kg/m   BP Readings from Last 3 Encounters:  07/26/18 126/64  07/25/18 122/60  03/22/18 128/72    Wt Readings from Last 3 Encounters:  07/26/18 146 lb (66.2 kg)  07/25/18 145 lb (65.8 kg)  03/22/18 154 lb (69.9 kg)    Physical Exam Constitutional:      General: He is not in acute distress.    Appearance: He is well-developed.     Comments: NAD  Eyes:     Conjunctiva/sclera: Conjunctivae normal.     Pupils: Pupils are equal, round, and reactive to light.  Neck:  Musculoskeletal: Normal range of motion.     Thyroid: No thyromegaly.     Vascular: No JVD.  Cardiovascular:     Rate and Rhythm: Normal rate and regular rhythm.     Heart sounds: Normal heart sounds. No murmur. No friction rub. No gallop.   Pulmonary:     Effort: Pulmonary effort is normal. No respiratory distress.     Breath sounds: Normal breath sounds. No wheezing or rales.  Chest:     Chest wall: No tenderness.  Abdominal:     General: Bowel sounds are normal. There is no distension.     Palpations: Abdomen is soft. There is no mass.     Tenderness: There is no abdominal tenderness. There is no guarding or rebound.  Musculoskeletal: Normal  range of motion.        General: No tenderness.  Lymphadenopathy:     Cervical: No cervical adenopathy.  Skin:    General: Skin is warm and dry.     Findings: No rash.  Neurological:     Mental Status: He is alert and oriented to person, place, and time.     Cranial Nerves: No cranial nerve deficit.     Motor: No abnormal muscle tone.     Coordination: Coordination normal.     Gait: Gait normal.     Deep Tendon Reflexes: Reflexes are normal and symmetric.  Psychiatric:        Behavior: Behavior normal.        Thought Content: Thought content normal.        Judgment: Judgment normal.    Walker  Lab Results  Component Value Date   WBC 8.6 12/15/2017   HGB 13.8 12/15/2017   HCT 41.2 12/15/2017   PLT 201.0 12/15/2017   GLUCOSE 131 (H) 12/15/2017   CHOL 159 04/22/2017   TRIG 68 04/22/2017   HDL 72 04/22/2017   LDLCALC 73 04/22/2017   ALT 13 09/13/2017   AST 22 09/13/2017   NA 137 12/15/2017   K 4.5 12/15/2017   CL 103 12/15/2017   CREATININE 1.44 12/15/2017   BUN 22 12/15/2017   CO2 27 12/15/2017   TSH 3.57 12/15/2017   PSA 6.45 (H) 08/07/2013   HGBA1C 6.4 12/05/2012    Dg Wrist Complete Right  Result Date: 10/15/2017 CLINICAL DATA:  Right wrist pain for 2 weeks.  No known injury. EXAM: RIGHT WRIST - COMPLETE 3+ VIEW COMPARISON:  None. FINDINGS: There is no evidence of fracture or dislocation. There is no evidence of arthropathy or other focal bone abnormality. There are vascular calcifications. Soft tissues are unremarkable. IMPRESSION: Vascular calcifications, otherwise unremarkable radiographs of the wrist. Electronically Signed   By: Keith Rake M.D.   On: 10/15/2017 01:07    Assessment & Plan:   There are no diagnoses linked to this encounter.   No orders of the defined types were placed in this encounter.    Follow-up: No follow-ups on file.  Walker Kehr, MD

## 2018-07-26 NOTE — Assessment & Plan Note (Signed)
On Plavix, ASA, Coreg, Simvastatin

## 2018-07-26 NOTE — Assessment & Plan Note (Signed)
Lisinopril Coreg dose reduced due to bradycardia

## 2018-07-26 NOTE — Assessment & Plan Note (Signed)
Coping fair w/isolation

## 2018-07-26 NOTE — Assessment & Plan Note (Signed)
Lisinopril, Coreg

## 2018-07-26 NOTE — Assessment & Plan Note (Signed)
On Simvastatin 

## 2018-07-27 ENCOUNTER — Ambulatory Visit: Payer: Medicare Other | Admitting: Internal Medicine

## 2018-08-02 ENCOUNTER — Other Ambulatory Visit: Payer: Self-pay

## 2018-08-02 ENCOUNTER — Encounter (HOSPITAL_COMMUNITY): Payer: Self-pay | Admitting: *Deleted

## 2018-08-02 ENCOUNTER — Emergency Department (HOSPITAL_COMMUNITY): Payer: Medicare Other

## 2018-08-02 ENCOUNTER — Inpatient Hospital Stay (HOSPITAL_COMMUNITY)
Admission: EM | Admit: 2018-08-02 | Discharge: 2018-08-04 | DRG: 726 | Disposition: A | Discharge status: Skilled Nursing Facility | Payer: Medicare Other | Source: Skilled Nursing Facility | Attending: Family Medicine | Admitting: Family Medicine

## 2018-08-02 ENCOUNTER — Observation Stay (HOSPITAL_COMMUNITY): Payer: Medicare Other

## 2018-08-02 DIAGNOSIS — Z1159 Encounter for screening for other viral diseases: Secondary | ICD-10-CM

## 2018-08-02 DIAGNOSIS — Z8249 Family history of ischemic heart disease and other diseases of the circulatory system: Secondary | ICD-10-CM

## 2018-08-02 DIAGNOSIS — Z20828 Contact with and (suspected) exposure to other viral communicable diseases: Secondary | ICD-10-CM | POA: Diagnosis not present

## 2018-08-02 DIAGNOSIS — I5042 Chronic combined systolic (congestive) and diastolic (congestive) heart failure: Secondary | ICD-10-CM | POA: Diagnosis present

## 2018-08-02 DIAGNOSIS — Z9079 Acquired absence of other genital organ(s): Secondary | ICD-10-CM

## 2018-08-02 DIAGNOSIS — N179 Acute kidney failure, unspecified: Secondary | ICD-10-CM | POA: Diagnosis present

## 2018-08-02 DIAGNOSIS — R1013 Epigastric pain: Secondary | ICD-10-CM

## 2018-08-02 DIAGNOSIS — R188 Other ascites: Secondary | ICD-10-CM | POA: Diagnosis not present

## 2018-08-02 DIAGNOSIS — N401 Enlarged prostate with lower urinary tract symptoms: Secondary | ICD-10-CM | POA: Diagnosis not present

## 2018-08-02 DIAGNOSIS — I252 Old myocardial infarction: Secondary | ICD-10-CM

## 2018-08-02 DIAGNOSIS — I160 Hypertensive urgency: Secondary | ICD-10-CM | POA: Diagnosis present

## 2018-08-02 DIAGNOSIS — Z96641 Presence of right artificial hip joint: Secondary | ICD-10-CM | POA: Diagnosis present

## 2018-08-02 DIAGNOSIS — I110 Hypertensive heart disease with heart failure: Secondary | ICD-10-CM | POA: Diagnosis present

## 2018-08-02 DIAGNOSIS — I444 Left anterior fascicular block: Secondary | ICD-10-CM | POA: Diagnosis present

## 2018-08-02 DIAGNOSIS — R17 Unspecified jaundice: Secondary | ICD-10-CM | POA: Diagnosis not present

## 2018-08-02 DIAGNOSIS — Z7982 Long term (current) use of aspirin: Secondary | ICD-10-CM

## 2018-08-02 DIAGNOSIS — Z955 Presence of coronary angioplasty implant and graft: Secondary | ICD-10-CM

## 2018-08-02 DIAGNOSIS — I2510 Atherosclerotic heart disease of native coronary artery without angina pectoris: Secondary | ICD-10-CM | POA: Diagnosis not present

## 2018-08-02 DIAGNOSIS — R001 Bradycardia, unspecified: Secondary | ICD-10-CM | POA: Diagnosis present

## 2018-08-02 DIAGNOSIS — R338 Other retention of urine: Secondary | ICD-10-CM | POA: Diagnosis present

## 2018-08-02 DIAGNOSIS — I10 Essential (primary) hypertension: Secondary | ICD-10-CM | POA: Insufficient documentation

## 2018-08-02 DIAGNOSIS — E785 Hyperlipidemia, unspecified: Secondary | ICD-10-CM | POA: Diagnosis present

## 2018-08-02 DIAGNOSIS — G3184 Mild cognitive impairment, so stated: Secondary | ICD-10-CM | POA: Diagnosis present

## 2018-08-02 DIAGNOSIS — R0789 Other chest pain: Secondary | ICD-10-CM | POA: Diagnosis not present

## 2018-08-02 DIAGNOSIS — Z79899 Other long term (current) drug therapy: Secondary | ICD-10-CM

## 2018-08-02 DIAGNOSIS — R079 Chest pain, unspecified: Secondary | ICD-10-CM | POA: Diagnosis not present

## 2018-08-02 DIAGNOSIS — Z85828 Personal history of other malignant neoplasm of skin: Secondary | ICD-10-CM

## 2018-08-02 LAB — HEPATIC FUNCTION PANEL
ALT: 25 U/L (ref 0–44)
AST: 62 U/L — ABNORMAL HIGH (ref 15–41)
Albumin: 3.8 g/dL (ref 3.5–5.0)
Alkaline Phosphatase: 34 U/L — ABNORMAL LOW (ref 38–126)
Bilirubin, Direct: 0.9 mg/dL — ABNORMAL HIGH (ref 0.0–0.2)
Indirect Bilirubin: 1.7 mg/dL — ABNORMAL HIGH (ref 0.3–0.9)
Total Bilirubin: 2.6 mg/dL — ABNORMAL HIGH (ref 0.3–1.2)
Total Protein: 6.9 g/dL (ref 6.5–8.1)

## 2018-08-02 LAB — CBC
HCT: 42.1 % (ref 39.0–52.0)
Hemoglobin: 13.9 g/dL (ref 13.0–17.0)
MCH: 31 pg (ref 26.0–34.0)
MCHC: 33 g/dL (ref 30.0–36.0)
MCV: 93.8 fL (ref 80.0–100.0)
Platelets: 188 10*3/uL (ref 150–400)
RBC: 4.49 MIL/uL (ref 4.22–5.81)
RDW: 14 % (ref 11.5–15.5)
WBC: 7.1 10*3/uL (ref 4.0–10.5)
nRBC: 0 % (ref 0.0–0.2)

## 2018-08-02 LAB — TYPE AND SCREEN
ABO/RH(D): O POS
Antibody Screen: NEGATIVE

## 2018-08-02 LAB — BASIC METABOLIC PANEL
Anion gap: 10 (ref 5–15)
BUN: 17 mg/dL (ref 8–23)
CO2: 23 mmol/L (ref 22–32)
Calcium: 9.4 mg/dL (ref 8.9–10.3)
Chloride: 102 mmol/L (ref 98–111)
Creatinine, Ser: 1.22 mg/dL (ref 0.61–1.24)
GFR calc Af Amer: 59 mL/min — ABNORMAL LOW (ref >60)
GFR calc non Af Amer: 51 mL/min — ABNORMAL LOW (ref >60)
Glucose, Bld: 91 mg/dL (ref 70–99)
Potassium: 4.4 mmol/L (ref 3.5–5.1)
Sodium: 135 mmol/L (ref 135–145)

## 2018-08-02 LAB — LIPASE, BLOOD: Lipase: 31 U/L (ref 11–51)

## 2018-08-02 LAB — TROPONIN I (HIGH SENSITIVITY)
Troponin I (High Sensitivity): 12 ng/L (ref <18)
Troponin I (High Sensitivity): 13 ng/L (ref <18)

## 2018-08-02 LAB — SARS CORONAVIRUS 2 BY RT PCR (HOSPITAL ORDER, PERFORMED IN ~~LOC~~ HOSPITAL LAB): SARS Coronavirus 2: NEGATIVE

## 2018-08-02 LAB — PROTIME-INR
INR: 1.1 (ref 0.8–1.2)
Prothrombin Time: 14.1 seconds (ref 11.4–15.2)

## 2018-08-02 LAB — ABO/RH: ABO/RH(D): O POS

## 2018-08-02 MED ORDER — ACETAMINOPHEN 325 MG PO TABS: 650.0000 mg | ORAL_TABLET | Freq: Four times a day (QID) | ORAL | Status: DC | PRN

## 2018-08-02 MED ORDER — LABETALOL HCL 5 MG/ML IV SOLN
20.0000 mg | Freq: Once | INTRAVENOUS | Status: AC
  Administered 2018-08-02: 20 mg via INTRAVENOUS
  Filled 2018-08-02: qty 4

## 2018-08-02 MED ORDER — HEPARIN SODIUM (PORCINE) 5000 UNIT/ML IJ SOLN
5000.0000 [IU] | Freq: Three times a day (TID) | INTRAMUSCULAR | Status: DC
  Administered 2018-08-02 – 2018-08-04 (×5): 5000 [IU] via SUBCUTANEOUS
  Filled 2018-08-02 (×5): qty 1

## 2018-08-02 MED ORDER — HYDRALAZINE HCL 20 MG/ML IJ SOLN
10.0000 mg | INTRAMUSCULAR | Status: DC | PRN
  Administered 2018-08-03: 10 mg via INTRAVENOUS
  Filled 2018-08-02: qty 1

## 2018-08-02 MED ORDER — FENTANYL CITRATE (PF) 100 MCG/2ML IJ SOLN: 25.0000 ug | INTRAMUSCULAR | Status: DC | PRN

## 2018-08-02 MED ORDER — SODIUM CHLORIDE 0.9 % IV SOLN
INTRAVENOUS | Status: AC
  Administered 2018-08-02: 22:00:00 via INTRAVENOUS

## 2018-08-02 MED ORDER — SODIUM CHLORIDE 0.9% FLUSH: 3.0000 mL | Freq: Once | INTRAVENOUS | Status: DC

## 2018-08-02 MED ORDER — ONDANSETRON HCL 4 MG/2ML IJ SOLN: 4.0000 mg | Freq: Four times a day (QID) | INTRAMUSCULAR | Status: DC | PRN

## 2018-08-02 MED ORDER — MORPHINE SULFATE (PF) 2 MG/ML IV SOLN: 1.0000 mg | Freq: Once | INTRAVENOUS | Status: DC

## 2018-08-02 MED ORDER — SODIUM CHLORIDE 0.9% FLUSH: 3.0000 mL | Freq: Two times a day (BID) | INTRAVENOUS | Status: DC

## 2018-08-02 MED ORDER — ACETAMINOPHEN 650 MG RE SUPP: 650.0000 mg | Freq: Four times a day (QID) | RECTAL | Status: DC | PRN

## 2018-08-02 MED ORDER — ONDANSETRON HCL 4 MG PO TABS: 4.0000 mg | ORAL_TABLET | Freq: Four times a day (QID) | ORAL | Status: DC | PRN

## 2018-08-02 MED ORDER — IOHEXOL 350 MG/ML SOLN
100.0000 mL | Freq: Once | INTRAVENOUS | Status: AC | PRN
  Administered 2018-08-02: 100 mL via INTRAVENOUS

## 2018-08-02 NOTE — ED Notes (Signed)
Rectal 97.3

## 2018-08-02 NOTE — ED Provider Notes (Signed)
[place] EMERGENCY DEPARTMENT Provider Note   CSN: [id number] Arrival date & time: 08/02/18  1330     History   Chief Complaint Chief Complaint  Patient presents with  . Chest Pain    HPI [name] is a 85 y.o. male.     HPI  85 year old man history of coronary artery disease, thrombocytopenia, status post MI, per lipidemia presents today complaining of chest pain but pointing to upper abdomen.  He states pain began this morning.  Patient is not a good historian.  He appears to have some dementia and is not aware of the year.  He states that the pain feels like someone hits him in the back however it begins in the upper abdomen.  He took a nitro per nursing report prior to coming to the hospital without relief.  He denies shortness of breath, nausea, vomiting, fever, chills, or exposure to COVID 19 or symptoms consistent with this.  However, he does live in a nursing care facility. Review of recent records from Dr. Alain Marion and Amie Portland revealed the patient has had some bradycardia and has been having his Coreg decreased. Discussed with his wife who states that the confusion around dates is at his baseline.  She states that he has been taking his medications but the carvedilol was discontinued about a week ago. Past Medical History:  Diagnosis Date  . Coronary atherosclerosis of unspecified type of vessel, native or graft   . Dizziness and giddiness   . Hypertrophy of prostate without urinary obstruction and other lower urinary tract symptoms (LUTS)   . MI (myocardial infarction) ([place]) 2002  . Osteoarthrosis, unspecified whether generalized or localized, unspecified site   . Other and unspecified hyperlipidemia   . Shortness of breath dyspnea    with exertion  . Skin cancer    "top of his head"  . Thrombocytopenia, unspecified ([place])   . Unspecified essential hypertension     Patient Active Problem List   Diagnosis Date Noted  . Abdominal pain  07/13/2018  . Bradycardia 07/13/2018  . Orthostatic dizziness 12/15/2017  . Wrist pain, acute, right 10/14/2017  . Seborrheic keratoses 06/01/2017  . Chest pain 04/22/2017  . Fall 04/22/2017  . Gait disorder 11/03/2016  . Nausea 10/12/2016  . Hearing loss 11/25/2015  . Wart viral 04/28/2015  . 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 Situational depression 08/07/2013  . Well adult exam 08/02/2012  . Nausea with vomiting 03/28/2012  . HYPERGLYCEMIA 02/23/2010  . HYPERKALEMIA 10/21/2009  . Actinic keratosis 10/21/2009  . 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 PSA, INCREASED 04/22/2009  . MELANOMA, SCALP 01/02/2009  . NEOPLASM, SKIN, UNCERTAIN BEHAVIOR 61/44/3154  . CAROTID ARTERY STENOSIS, WITHOUT INFARCTION 05/06/2008  . THROMBOCYTOPENIA 04/29/2008  . Congestive heart failure ([place]) 04/29/2008  . LEFT VENTRICULAR FUNCTION, DECREASED 04/29/2008  . Osteoarthritis 04/29/2008  . VERTIGO 03/11/2008  . Dyslipidemia 04/14/2007  . Essential hypertension 04/14/2007  . Coronary atherosclerosis 04/14/2007  . BPH (benign prostatic hyperplasia) 04/14/2007    Past Surgical History:  Procedure Laterality Date  . COLONOSCOPY    . CORONARY ANGIOPLASTY  01/07/2001, 2008   2 stents  . ESOPHAGOGASTRODUODENOSCOPY N/A 04/22/2014   Procedure: ESOPHAGOGASTRODUODENOSCOPY (EGD);  Surgeon: Milus Banister, MD;  Location: [facility];  Service: Endoscopy;  Laterality: N/A;  . HIP ARTHROPLASTY Right 2006  . JOINT REPLACEMENT    . MOHS SURGERY     "top of his head"  . TRANSURETHRAL RESECTION OF PROSTATE  Home Medications    Prior to Admission medications   Medication Sig Start Date End Date Taking? Authorizing Provider  aspirin 81 MG EC tablet Take 81 mg by mouth daily.      [provider]  b complex vitamins tablet Take 1 tablet by mouth daily.      [provider]  Cholecalciferol 1000 UNITS tablet Take 1,000 Units by mouth daily.      [provider]  finasteride (PROSCAR) 5 MG tablet Take 1 tablet (5 mg total) by mouth daily. 11/08/17   Plotnikov, Evie Lacks, MD  Glucosamine-Chondroit-Vit C-Mn (GLUCOSAMINE 1500 COMPLEX PO) Take 1 tablet by mouth 2 (two) times daily.     [provider]  lisinopril (ZESTRIL) 10 MG tablet Take 0.5 tablets (5 mg total) by mouth 2 (two) times daily. 07/25/18   Imogene Burn, PA-C  methylPREDNISolone (MEDROL DOSEPAK) 4 MG TBPK tablet As directed 10/14/17   Plotnikov, Evie Lacks, MD  nitroGLYCERIN (NITROSTAT) 0.4 MG SL tablet Place 1 tablet (0.4 mg total) under the tongue every 5 (five) minutes as needed for chest pain (Please call your doctor/911 if no improvement after the second dose). 10/11/17   Plotnikov, Evie Lacks, MD  pantoprazole (PROTONIX) 40 MG tablet Take 1 tablet (40 mg total) by mouth daily. 06/13/18   Plotnikov, Evie Lacks, MD  polyethylene glycol (MIRALAX / GLYCOLAX) packet Take 17 g by mouth daily.    [provider]  simvastatin (ZOCOR) 80 MG tablet Take 1 tablet (80 mg total) by mouth daily. 05/25/18   Plotnikov, Evie Lacks, MD  vitamin C (ASCORBIC ACID) 500 MG tablet Take 500 mg by mouth daily.    [provider]    Family History Family History  Problem Relation Age of Onset  . Heart disease Father   . Coronary artery disease Other   . Colon cancer Neg Hx     Social History Social History   Tobacco Use  . Smoking status: Never Smoker  . Smokeless tobacco: Never Used  Substance Use Topics  . Alcohol use: No    Alcohol/week: 0.0 standard drinks  . Drug use: No     Allergies   Oxycodone and Penicillins   Review of Systems Review of Systems  All other systems reviewed and are negative.    Physical Exam Updated Vital Signs BP (!) 168/90 (BP Location: Right Arm)   Pulse (!) 54   Temp 97.7 F (36.5 C) (Oral)   Resp 16   SpO2 100%   Physical Exam Vitals signs and nursing note reviewed.  Constitutional:      General: He is not in  acute distress.    Appearance: He is well-developed. He is not ill-appearing.  HENT:     Head: Normocephalic.  Eyes:     Pupils: Pupils are equal, round, and reactive to light.  Neck:     Musculoskeletal: Normal range of motion.  Cardiovascular:     Rate and Rhythm: Normal rate and regular rhythm.     Pulses:          Carotid pulses are 1+ on the right side and 1+ on the left side.      Radial pulses are 1+ on the right side and 1+ on the left side.     Heart sounds: Normal heart sounds.     Comments: Femoral pulses 2+ equal bilaterally Pulmonary:     Effort: Pulmonary effort is normal.     Breath sounds: Normal breath sounds.  Abdominal:     General: Bowel sounds are normal.     Palpations: Abdomen is soft.     Comments: Epigastric and right upper quadrant tenderness palpation  Musculoskeletal: Normal range of motion.  Skin:    General: Skin is warm and dry.     Capillary Refill: Capillary refill takes less than 2 seconds.  Neurological:     General: No focal deficit present.     Mental Status: He is alert. He is disoriented.     Cranial Nerves: No cranial nerve deficit.     Motor: No weakness.      ED Treatments / Results  Labs (all labs ordered are listed, but only abnormal results are displayed) Labs Reviewed  BASIC METABOLIC PANEL - Abnormal; Notable for the following components:      Result Value   GFR calc non Af Amer 51 (*)    GFR calc Af Amer 59 (*)    All other components within normal limits  CBC  TROPONIN I (HIGH SENSITIVITY)  TROPONIN I (HIGH SENSITIVITY)  HEPATIC FUNCTION PANEL  LIPASE, BLOOD    EKG EKG Interpretation  Date/Time:  Wednesday August 02 2018 13:29:38 EDT Ventricular Rate:  53 PR Interval:  158 QRS Duration: 96 QT Interval:  450 QTC Calculation: 422 R Axis:   -50 Text Interpretation:  Sinus bradycardia Left anterior fascicular block Abnormal ECG No significant change since last tracing March 2019 Confirmed by Pattricia Boss [phone])  on 08/02/2018 4:41:05 PM   Radiology Dg Chest 2 View  Result Date: 08/02/2018 CLINICAL DATA:  Chest pain EXAM: CHEST - 2 VIEW COMPARISON:  April 22, 2017. FINDINGS: The heart size is stable. The lungs are somewhat hyperexpanded. There appear to be emphysematous changes bilaterally with scattered areas of pleuroparenchymal scarring. There are degenerative changes throughout the visualized thoracic spine without evidence of an acute displaced fracture. There are degenerative changes of both glenohumeral joints. There are aortic calcifications. There are coronary artery calcifications versus coronary artery stents. IMPRESSION: No active cardiopulmonary disease. Electronically Signed   By: Constance Holster M.D.   On: 08/02/2018 14:04    Procedures Procedures (including critical care time)  Medications Ordered in ED Medications  sodium chloride flush (NS) 0.9 % injection 3 mL (3 mLs Intravenous Not Given 08/02/18 1557)     Initial Impression / Assessment and Plan / ED Course  I have reviewed the triage vital signs and the nursing notes.  Pertinent labs & imaging results that were available during my care of the patient were reviewed by me and considered in my medical decision making (see chart for details).       85 year old male presents today with upper abdominal pain and tenderness.  CT angios reveals no evidence of acute abnormality However patient is tender in his upper abdomen and bilirubin is elevated at 2.6 with AST at 62.  Lipase is normal at 31.   Patient is hypertensive here and is treated with labetalol.  He has recently had carvedilol stopped due to bradycardia. Discussed with Dr. Myna Hidalgo and he will see for admission Called wife and discussed disposition with her.  Voices understanding of plan. Wife [name]- contact [phone] Final Clinical Impressions(s) / ED Diagnoses   Final diagnoses:  Epigastric pain  Hypertension, unspecified type    ED Discharge Orders    None        Pattricia Boss, MD 08/02/18 1932

## 2018-08-02 NOTE — ED Notes (Signed)
Attempted report x 2 

## 2018-08-02 NOTE — ED Notes (Signed)
ED TO INPATIENT HANDOFF REPORT  ED Nurse Name and Phone #: [phone]  S Name/Age/Gender Cardell Peach Swallows 85 y.o. male Room/Bed: 040C/040C  Code Status   Code Status: Prior  Home/SNF/Other Skilled nursing facility Patient oriented to: self, place, time and situation Is this baseline? Yes   Triage Complete: Triage complete  Chief Complaint cp  Triage Note Pt arrived by [facility] from [facility]. Reports having episode of mid chest pain today. Denies sob. Pt took nitro prior to ems arrival which relieved his pain.    Allergies Allergies  Allergen Reactions  . Oxycodone Nausea And Vomiting  . Penicillins Hives    Level of Care/Admitting Diagnosis ED Disposition    ED Disposition Condition [facility] Area: [place] [[id number]]  Level of Care: Telemetry Medical [104]  I expect the patient will be discharged within 24 hours: Yes  LOW acuity---Tx typically complete <24 hrs---ACUTE conditions typically can be evaluated <24 hours---LABS likely to return to acceptable levels <24 hours---IS near functional baseline---EXPECTED to return to current living arrangement---NOT newly hypoxic: Does not meet criteria for 5C-Observation unit  Covid Evaluation: Confirmed COVID Negative  Diagnosis: Abdominal pain, acute, epigastric [[id number]]  Admitting Physician: Vianne Bulls [[id number]]  Attending Physician: Vianne Bulls [[id number]]  PT Class (Do Not Modify): Observation [104]  PT Acc Code (Do Not Modify): Observation [10022]       B Medical/Surgery History Past Medical History:  Diagnosis Date  . Coronary atherosclerosis of unspecified type of vessel, native or graft   . Dizziness and giddiness   . Hypertrophy of prostate without urinary obstruction and other lower urinary tract symptoms (LUTS)   . MI (myocardial infarction) ([place]) 2002  . Osteoarthrosis, unspecified whether generalized or localized, unspecified site   . Other and unspecified hyperlipidemia   .  Shortness of breath dyspnea    with exertion  . Skin cancer    "top of his head"  . Thrombocytopenia, unspecified ([place])   . Unspecified essential hypertension    Past Surgical History:  Procedure Laterality Date  . COLONOSCOPY    . CORONARY ANGIOPLASTY  01/07/2001, 2008   2 stents  . ESOPHAGOGASTRODUODENOSCOPY N/A 04/22/2014   Procedure: ESOPHAGOGASTRODUODENOSCOPY (EGD);  Surgeon: Milus Banister, MD;  Location: [facility];  Service: Endoscopy;  Laterality: N/A;  . HIP ARTHROPLASTY Right 2006  . JOINT REPLACEMENT    . MOHS SURGERY     "top of his head"  . TRANSURETHRAL RESECTION OF PROSTATE       A IV Location/Drains/Wounds Patient Lines/Drains/Airways Status   Active Line/Drains/Airways    Name:   Placement date:   Placement time:   Site:   Days:   Peripheral IV 08/02/18 Left Antecubital   08/02/18    1334    Antecubital   less than 1          Intake/Output Last 24 hours No intake or output data in the 24 hours ending 08/02/18 1959  Labs/Imaging Results for orders placed or performed during the hospital encounter of 08/02/18 (from the past 48 hour(s))  Basic metabolic panel     Status: Abnormal   Collection Time: 08/02/18  1:37 PM  Result Value Ref Range   Sodium 135 135 - 145 mmol/L   Potassium 4.4 3.5 - 5.1 mmol/L   Chloride 102 98 - 111 mmol/L   CO2 23 22 - 32 mmol/L   Glucose, Bld 91 70 - 99 mg/dL   BUN 17 8 - 23  mg/dL   Creatinine, Ser 1.22 0.61 - 1.24 mg/dL   Calcium 9.4 8.9 - 10.3 mg/dL   GFR calc non Af Amer 51 (L) >60 mL/min   GFR calc Af Amer 59 (L) >60 mL/min   Anion gap 10 5 - 15    Comment: Performed at [facility] [address] [place] [address]  CBC     Status: None   Collection Time: 08/02/18  1:37 PM  Result Value Ref Range   WBC 7.1 4.0 - 10.5 K/uL   RBC 4.49 4.22 - 5.81 MIL/uL   Hemoglobin 13.9 13.0 - 17.0 g/dL   HCT 42.1 39.0 - 52.0 %   MCV 93.8 80.0 - 100.0 fL   MCH 31.0 26.0 - 34.0 pg   MCHC 33.0 30.0 - 36.0 g/dL    RDW 14.0 11.5 - 15.5 %   Platelets 188 150 - 400 K/uL   nRBC 0.0 0.0 - 0.2 %    Comment: Performed at [facility], [place] [address], [place], [place] [address]  Troponin I (High Sensitivity)     Status: None   Collection Time: 08/02/18  1:37 PM  Result Value Ref Range   Troponin I (High Sensitivity) 12 <18 ng/L    Comment: (NOTE) Elevated high sensitivity troponin I (hsTnI) values and significant  changes across serial measurements may suggest ACS but many other  chronic and acute conditions are known to elevate hsTnI results.  Refer to the "Links" section for chest pain algorithms and additional  guidance. Performed at [facility], [place] [address], [place], [place] [address]   Troponin I (High Sensitivity)     Status: None   Collection Time: 08/02/18  3:37 PM  Result Value Ref Range   Troponin I (High Sensitivity) 13 <18 ng/L    Comment: (NOTE) Elevated high sensitivity troponin I (hsTnI) values and significant  changes across serial measurements may suggest ACS but many other  chronic and acute conditions are known to elevate hsTnI results.  Refer to the "Links" section for chest pain algorithms and additional  guidance. Performed at [facility], [place] [address] [place], [place] [address]   Hepatic function panel     Status: Abnormal   Collection Time: 08/02/18  5:48 PM  Result Value Ref Range   Total Protein 6.9 6.5 - 8.1 g/dL   Albumin 3.8 3.5 - 5.0 g/dL   AST 62 (H) 15 - 41 U/L   ALT 25 0 - 44 U/L   Alkaline Phosphatase 34 (L) 38 - 126 U/L   Total Bilirubin 2.6 (H) 0.3 - 1.2 mg/dL   Bilirubin, Direct 0.9 (H) 0.0 - 0.2 mg/dL   Indirect Bilirubin 1.7 (H) 0.3 - 0.9 mg/dL    Comment: Performed at [facility] [address], [place], [place] [address]  Lipase, blood     Status: None   Collection Time: 08/02/18  5:48 PM  Result Value Ref Range   Lipase 31 11 - 51 U/L    Comment: Performed at [facility] [address]  [place], [place] [address]  Protime-INR     Status: None   Collection Time: 08/02/18  5:48 PM  Result Value Ref Range   Prothrombin Time 14.1 11.4 - 15.2 seconds   INR 1.1 0.8 - 1.2    Comment: (NOTE) INR goal varies based on device and disease states. Performed at [facility], [place] [address]  [place], [place] [address]   Type and screen Capac     Status: None   Collection Time: 08/02/18  5:50 PM  Result Value Ref Range   ABO/RH(D) O POS    Antibody Screen NEG    Sample Expiration      08/05/2018,2359 Performed at [facility], [place] [address] [place], [place] [address]   ABO/Rh     Status: None   Collection Time: 08/02/18  5:50 PM  Result Value Ref Range   ABO/RH(D)      O POS Performed at [facility] [address], [place], [place] [address]   SARS Coronavirus 2 (CEPHEID - Performed in [facility] hospital lab), Hosp Order     Status: None   Collection Time: 08/02/18  5:51 PM   Specimen: Nasopharyngeal Swab  Result Value Ref Range   SARS Coronavirus 2 NEGATIVE NEGATIVE    Comment: (NOTE) If result is NEGATIVE SARS-CoV-2 target nucleic acids are NOT DETECTED. The SARS-CoV-2 RNA is generally detectable in upper and lower  respiratory specimens during the acute phase of infection. The lowest  concentration of SARS-CoV-2 viral copies this assay can detect is 250  copies / mL. A negative result does not preclude SARS-CoV-2 infection  and should not be used as the sole basis for treatment or other  patient management decisions.  A negative result may occur with  improper specimen collection / handling, submission of specimen other  than nasopharyngeal swab, presence of viral mutation(s) within the  areas targeted by this assay, and inadequate number of viral copies  (<250 copies / mL). A negative result must be combined with clinical  observations, patient history, and epidemiological information. If result is POSITIVE SARS-CoV-2  target nucleic acids are DETECTED. The SARS-CoV-2 RNA is generally detectable in upper and lower  respiratory specimens dur ing the acute phase of infection.  Positive  results are indicative of active infection with SARS-CoV-2.  Clinical  correlation with patient history and other diagnostic information is  necessary to determine patient infection status.  Positive results do  not rule out bacterial infection or co-infection with other viruses. If result is PRESUMPTIVE POSTIVE SARS-CoV-2 nucleic acids MAY BE PRESENT.   A presumptive positive result was obtained on the submitted specimen  and confirmed on repeat testing.  While 2019 novel coronavirus  (SARS-CoV-2) nucleic acids may be present in the submitted sample  additional confirmatory testing may be necessary for epidemiological  and / or clinical management purposes  to differentiate between  SARS-CoV-2 and other Sarbecovirus currently known to infect humans.  If clinically indicated additional testing with an alternate test  methodology [phone]) is advised. The SARS-CoV-2 RNA is generally  detectable in upper and lower respiratory sp ecimens during the acute  phase of infection. The expected result is Negative. Fact Sheet for Patients:  StrictlyIdeas.no Fact Sheet for Healthcare Providers: BankingDealers.co.za This test is not yet approved or cleared by the [place] FDA and has been authorized for detection and/or diagnosis of SARS-CoV-2 by FDA under an Emergency Use Authorization (EUA).  This EUA will remain in effect (meaning this test can be used) for the duration of the COVID-19 declaration under Section 564(b)(1) of the Act, 21 U.S.C. section 360bbb-3(b)(1), unless the authorization is terminated or revoked sooner. Performed at [facility], [place] [address] [place], [place] [address]    Dg Chest 2 View  Result Date: 08/02/2018 CLINICAL DATA:  Chest pain EXAM:  CHEST -  2 VIEW COMPARISON:  April 22, 2017. FINDINGS: The heart size is stable. The lungs are somewhat hyperexpanded. There appear to be emphysematous changes bilaterally with scattered areas of pleuroparenchymal scarring. There are degenerative changes throughout the visualized thoracic spine without evidence of an acute displaced fracture. There are degenerative changes of both glenohumeral joints. There are aortic calcifications. There are coronary artery calcifications versus coronary artery stents. IMPRESSION: No active cardiopulmonary disease. Electronically Signed   By: Constance Holster M.D.   On: 08/02/2018 14:04   Ct Angio Chest/abd/pel For Dissection W And/or Wo Contrast  Result Date: 08/02/2018 CLINICAL DATA:  Mid chest pain.  Back pain. EXAM: CT ANGIOGRAPHY CHEST, ABDOMEN AND PELVIS TECHNIQUE: Multidetector CT imaging through the chest, abdomen and pelvis was performed using the standard protocol during bolus administration of intravenous contrast. Multiplanar reconstructed images and MIPs were obtained and reviewed to evaluate the vascular anatomy. CONTRAST:  126mL OMNIPAQUE IOHEXOL 350 MG/ML SOLN COMPARISON:  CT chest 04/22/2017 FINDINGS: CTA CHEST FINDINGS Cardiovascular: The initial noncontrast images demonstrate no evidence of acute intramural hematoma. Coronary, aortic arch, and branch vessel atherosclerotic vascular disease. No thoracic aortic dissection is identified. The the branch vessels appear widely patent and both vertebral arteries appear patent in the upper thorax. Although today's exam was optimized for systemic and not pulmonary arterial opacification, the pulmonary arteries are well opacified and no filling defect is identified in the pulmonary arterial tree. Mediastinum/Nodes: Right lower paratracheal node 1.0 cm in short axis on image 55/4, previously the same on 04/22/2017. Left lower paratracheal node 0.9 cm in short axis on image 49/7, likewise stable. No overtly pathologic  adenopathy in the chest. Lungs/Pleura: Biapical pleuroparenchymal scarring, stable. Peripheral interstitial accentuation in both lungs. Mild airway plugging anteriorly in the left upper lobe shown on image 69/8. Mild scarring posteriorly in the right upper lobe adjacent to the major fissure. Musculoskeletal: Asymmetric degenerative sternoclavicular arthropathy, right greater than left. Mild thoracic kyphosis and moderate spondylosis. Review of the MIP images confirms the above findings. CTA ABDOMEN AND PELVIS FINDINGS VASCULAR Aorta: Aortoiliac atherosclerotic vascular disease. No aneurysm or dissection. Celiac: Atheromatous plaque along the upper margin of the origin but the celiac artery appears patent and eventually of psoriasis to the proper hepatic artery. SMA: There is some atheromatous plaque about 2.4 cm from the origin but the SMA remains widely patent. Renals: Single bilateral renal arteries appear patent although there is some early branching of the left renal artery. IMA: Patent. Inflow: Atheromatous calcification with some chronic mural thrombus along the right common iliac artery. Veins: The veins are not significantly opacified with contrast. No discrete abnormality is identified. Review of the MIP images confirms the above findings. NON-VASCULAR Hepatobiliary: The arterial phase appearance of the liver is unremarkable. The gallbladder is mildly obscured by motion artifact but without discrete abnormality. Pancreas: Partially obscured by motion artifact but otherwise unremarkable. Spleen: Unremarkable Adrenals/Urinary Tract: Fullness of the left adrenal gland without discrete mass. Small hypodense renal lesions are technically too small to characterize but most likely represent cysts. 2.6 by 2.1 cm hypodense lesion of the left mid kidney posteriorly on image 147/7 is slightly higher than fluid density, most likely a complex cyst. 1.9 by 1.7 cm hypodense lesion of the left kidney lower pole, fluid  density and likely a cyst. Approximately 1.6 by 1.3 cm hypodense lesion of the right mid kidney medially, probably a cyst although slightly higher than fluid density. Stomach/Bowel: Sigmoid colon diverticulosis. Lymphatic: No pathologic adenopathy is identified. Reproductive: The right  testicle appears to be retracted up nearly to the inguinal ring. Prostate gland partially obscured by streak artifact from the patient's right hip prosthesis but appears grossly unremarkable. Other: No supplemental non-categorized findings. Musculoskeletal: Right total hip prosthesis. Bridging spurring of both sacroiliac joints. Grade 1 degenerative anterolisthesis at L4-5 with right foraminal impingement at L3-4 and L4-5 and left foraminal impingement at L3-4 primarily from facet spurring. Review of the MIP images confirms the above findings. IMPRESSION: 1. No aortic dissection or acute vascular findings. No findings of pulmonary embolus in the lungs. 2. Other imaging findings of potential clinical significance: Aortic Atherosclerosis (ICD10-I70.0). Coronary atherosclerosis. Mild peripheral interstitial accentuation in both lungs, query mild fibrosis. Minimal airway plugging anteriorly in the left upper lobe. Thoracic kyphosis with spondylosis. Lumbar impingement at L3-4 and L4-5. Bilateral hypodense renal lesions are likely cysts although only characterized during a single phase today, and does not entirely specific. Sigmoid colon diverticulosis. Retracted right testicle nearly back to the inguinal ring. Electronically Signed   By: Van Clines M.D.   On: 08/02/2018 17:37    Pending Labs [employer] (From admission, onward)    Start     Ordered   Signed and Held  Comprehensive metabolic panel  Tomorrow morning,   R     Signed and Held   Signed and Held  CBC WITH DIFFERENTIAL  Tomorrow morning,   R     Signed and Held   Signed and Held  Lipase, blood  Tomorrow morning,   R     Signed and Held           Vitals/Pain Today's Vitals   08/02/18 1901 08/02/18 1903 08/02/18 1904 08/02/18 1930  BP: (!) 213/105   (!) 189/92  Pulse:   60 (!) 55  Resp:   15 (!) 21  Temp:      TempSrc:      SpO2:   98% 100%  PainSc:  0-No pain      Isolation Precautions No active isolations  Medications Medications  sodium chloride flush (NS) 0.9 % injection 3 mL (3 mLs Intravenous Not Given 08/02/18 1557)  morphine 2 MG/ML injection 1 mg (has no administration in time range)  hydrALAZINE (APRESOLINE) injection 10 mg (has no administration in time range)  fentaNYL (SUBLIMAZE) injection 25-50 mcg (has no administration in time range)  iohexol (OMNIPAQUE) 350 MG/ML injection 100 mL (100 mLs Intravenous Contrast Given 08/02/18 1708)  labetalol (NORMODYNE) injection 20 mg (20 mg Intravenous Given 08/02/18 1901)  labetalol (NORMODYNE) injection 20 mg (20 mg Intravenous Given 08/02/18 1930)    Mobility  Moderate fall risk   Focused Assessments Cardiac Assessment Handoff:  Cardiac Rhythm: Sinus bradycardia Lab Results  Component Value Date   CKTOTAL 98 11/03/2016   CKMB 2.8 09/29/2010   TROPONINI 0.04 (HH) 04/23/2017   Lab Results  Component Value Date   DDIMER 0.78 (H) 04/22/2017   Does the Patient currently have chest pain?N    R Recommendations: See Admitting Provider Note  Report given to:   Additional Notes:

## 2018-08-02 NOTE — Progress Notes (Signed)
[name] is a 85 y.o. male patient admitted from ED awake, alert - oriented  X 4 - no acute distress noted.  VSS - Blood pressure (!) 179/68, pulse (!) 51, temperature 98.1 F (36.7 C), temperature source Oral, resp. rate 17, SpO2 99 %.    IV in place, occlusive dsg intact without redness.     Will cont to eval and treat per MD orders.  Vidal Schwalbe, RN 08/02/2018 09:30PM

## 2018-08-02 NOTE — H&P (Signed)
History and Physical    [name] YTK:[id number] DOB: [birth year] DOA: 08/02/2018  PCP: Cassandria Anger, MD   Patient coming from: Home   Chief Complaint: "chest pain"   HPI: [name] is a 85 y.o. male with medical history significant for coronary artery disease, hypertension, BPH, and chronic mild cognitive deficits, now presenting to emergency department for evaluation of chest pain.  Patient reports that he developed pain this morning, reporting "chest pain" but pointing to his epigastrium.  He denies any associated shortness of breath, leg swelling, or leg tenderness.  He denies cough or fevers.  Patient has some mild confusion which his wife reports is chronic and at his baseline currently.  Patient had an acute episode of severe abdominal pain 3 weeks ago that resolved quickly without any intervention or diagnosis.  He recently had his Coreg reduced and then discontinued due to bradycardia.  Patient gives a limited history due to his chronic cognitive deficits.  ED Course: Upon arrival to the ED, patient is found to be afebrile, saturating low 90s on room air, and hypertensive to 216/97.  EKG features a sinus rhythm and chest x-ray is negative for acute cardiopulmonary disease.  CTA chest/abdomen/pelvis is negative for dissection, acute vascular finding, or PE.  Chemistry panel features a bilirubin of 2.6, previously normal, and a slight elevation in AST.  CBC is unremarkable.  High-sensitivity troponin is negative x2.  COVID-19 is negative.  Patient was given 20 mg IV labetalol and morphine in the ED.  Hospitalists are asked to admit..  Review of Systems:  Unable to complete ROS secondary to the patient's clinical condition.  Past Medical History:  Diagnosis Date   Coronary atherosclerosis of unspecified type of vessel, native or graft    Dizziness and giddiness    Hypertrophy of prostate without urinary obstruction and other lower urinary tract symptoms (LUTS)    MI  (myocardial infarction) ([place]) 2002   Osteoarthrosis, unspecified whether generalized or localized, unspecified site    Other and unspecified hyperlipidemia    Shortness of breath dyspnea    with exertion   Skin cancer    "top of his head"   Thrombocytopenia, unspecified ([place])    Unspecified essential hypertension     Past Surgical History:  Procedure Laterality Date   COLONOSCOPY     CORONARY ANGIOPLASTY  01/07/2001, 2008   2 stents   ESOPHAGOGASTRODUODENOSCOPY N/A 04/22/2014   Procedure: ESOPHAGOGASTRODUODENOSCOPY (EGD);  Surgeon: Milus Banister, MD;  Location: [facility];  Service: Endoscopy;  Laterality: N/A;   HIP ARTHROPLASTY Right 2006   JOINT REPLACEMENT     MOHS SURGERY     "top of his head"   TRANSURETHRAL RESECTION OF PROSTATE       reports that he has never smoked. He has never used smokeless tobacco. He reports that he does not drink alcohol or use drugs.  Allergies  Allergen Reactions   Oxycodone Nausea And Vomiting   Penicillins Hives    Family History  Problem Relation Age of Onset   Heart disease Father    Coronary artery disease Other    Colon cancer Neg Hx      Prior to Admission medications   Medication Sig Start Date End Date Taking? Authorizing Provider  aspirin 81 MG EC tablet Take 81 mg by mouth daily.     Yes [provider]  b complex vitamins tablet Take 1 tablet by mouth daily.     Yes [provider]  Cholecalciferol 1000 UNITS tablet Take 1,000 Units by mouth daily.     Yes [provider]  finasteride (PROSCAR) 5 MG tablet Take 1 tablet (5 mg total) by mouth daily. 11/08/17  Yes Plotnikov, Evie Lacks, MD  Glucosamine-Chondroit-Vit C-Mn (GLUCOSAMINE 1500 COMPLEX PO) Take 1 tablet by mouth 2 (two) times daily.    Yes [provider]  lisinopril (ZESTRIL) 10 MG tablet Take 0.5 tablets (5 mg total) by mouth 2 (two) times daily. Patient taking differently: Take 10 mg by mouth 2 (two) times  daily.  07/25/18  Yes Imogene Burn, PA-C  nitroGLYCERIN (NITROSTAT) 0.4 MG SL tablet Place 1 tablet (0.4 mg total) under the tongue every 5 (five) minutes as needed for chest pain (Please call your doctor/911 if no improvement after the second dose). 10/11/17  Yes Plotnikov, Evie Lacks, MD  pantoprazole (PROTONIX) 40 MG tablet Take 1 tablet (40 mg total) by mouth daily. 06/13/18  Yes Plotnikov, Evie Lacks, MD  polyethylene glycol (MIRALAX / GLYCOLAX) packet Take 17 g by mouth daily.   Yes [provider]  simvastatin (ZOCOR) 80 MG tablet Take 1 tablet (80 mg total) by mouth daily. 05/25/18  Yes Plotnikov, Evie Lacks, MD  vitamin C (ASCORBIC ACID) 500 MG tablet Take 500 mg by mouth daily.   Yes [provider]  carvedilol (COREG) 3.125 MG tablet Take 3.125 mg by mouth 2 (two) times daily with a meal.    [provider]    Physical Exam: Vitals:   08/02/18 1900 08/02/18 1901 08/02/18 1904 08/02/18 1930  BP:  (!) 213/105  (!) 189/92  Pulse:   60 (!) 55  Resp:   15 (!) 21  Temp:      TempSrc:      SpO2: 96%  98% 100%    Constitutional: NAD, appears uncomfortable  Eyes: PERTLA, lids and conjunctivae normal ENMT: Mucous membranes are moist. Posterior pharynx clear of any exudate or lesions.   Neck: normal, supple, no masses, no thyromegaly Respiratory: no wheezing, no crackles. No accessory muscle use.  Cardiovascular: S1 & S2 heard, regular rate and rhythm. No extremity edema.   Abdomen: No distension, tender in epigastrium and RUQ. Bowel sounds active.  Musculoskeletal: no clubbing / cyanosis. No joint deformity upper and lower extremities.   Skin: no significant rashes, lesions, ulcers. Warm, dry, well-perfused. Neurologic: No facial asymmetry. Sensation intact. Moving all extremities.  Psychiatric: Alert and oriented to person and place only. Pleasant, cooperative.    Labs on Admission: I have personally reviewed following labs and imaging  studies  CBC: Recent Labs  Lab 08/02/18 1337  WBC 7.1  HGB 13.9  HCT 42.1  MCV 93.8  PLT 161   Basic Metabolic Panel: Recent Labs  Lab 08/02/18 1337  NA 135  K 4.4  CL 102  CO2 23  GLUCOSE 91  BUN 17  CREATININE 1.22  CALCIUM 9.4   GFR: Estimated Creatinine Clearance: 36.2 mL/min (by C-G formula based on SCr of 1.22 mg/dL). Liver Function Tests: Recent Labs  Lab 08/02/18 1748  AST 62*  ALT 25  ALKPHOS 34*  BILITOT 2.6*  PROT 6.9  ALBUMIN 3.8   Recent Labs  Lab 08/02/18 1748  LIPASE 31   No results for input(s): AMMONIA in the last 168 hours. Coagulation Profile: Recent Labs  Lab 08/02/18 1748  INR 1.1   Cardiac Enzymes: No results for input(s): CKTOTAL, CKMB, CKMBINDEX, TROPONINI in the last 168 hours. BNP (last 3 results) No results for  input(s): PROBNP in the last 8760 hours. HbA1C: No results for input(s): HGBA1C in the last 72 hours. CBG: No results for input(s): GLUCAP in the last 168 hours. Lipid Profile: No results for input(s): CHOL, HDL, LDLCALC, TRIG, CHOLHDL, LDLDIRECT in the last 72 hours. Thyroid Function Tests: No results for input(s): TSH, T4TOTAL, FREET4, T3FREE, THYROIDAB in the last 72 hours. Anemia Panel: No results for input(s): VITAMINB12, FOLATE, FERRITIN, TIBC, IRON, RETICCTPCT in the last 72 hours. Urine analysis:    Component Value Date/Time   COLORURINE YELLOW 08/07/2013 1444   APPEARANCEUR CLEAR 08/07/2013 1444   LABSPEC 1.025 08/07/2013 1444   PHURINE 5.5 08/07/2013 1444   GLUCOSEU NEGATIVE 08/07/2013 1444   HGBUR NEGATIVE 08/07/2013 1444   BILIRUBINUR NEGATIVE 08/07/2013 1444   KETONESUR TRACE (A) 08/07/2013 1444   PROTEINUR NEGATIVE 06/12/2013 1846   UROBILINOGEN 0.2 08/07/2013 1444   NITRITE NEGATIVE 08/07/2013 1444   LEUKOCYTESUR NEGATIVE 08/07/2013 1444   Sepsis Labs: @LABRCNTIP (procalcitonin:4,lacticidven:4) ) Recent Results (from the past 240 hour(s))  SARS Coronavirus 2 (CEPHEID - Performed in [facility] hospital lab), Hosp Order     Status: None   Collection Time: 08/02/18  5:51 PM   Specimen: Nasopharyngeal Swab  Result Value Ref Range Status   SARS Coronavirus 2 NEGATIVE NEGATIVE Final    Comment: (NOTE) If result is NEGATIVE SARS-CoV-2 target nucleic acids are NOT DETECTED. The SARS-CoV-2 RNA is generally detectable in upper and lower  respiratory specimens during the acute phase of infection. The lowest  concentration of SARS-CoV-2 viral copies this assay can detect is 250  copies / mL. A negative result does not preclude SARS-CoV-2 infection  and should not be used as the sole basis for treatment or other  patient management decisions.  A negative result may occur with  improper specimen collection / handling, submission of specimen other  than nasopharyngeal swab, presence of viral mutation(s) within the  areas targeted by this assay, and inadequate number of viral copies  (<250 copies / mL). A negative result must be combined with clinical  observations, patient history, and epidemiological information. If result is POSITIVE SARS-CoV-2 target nucleic acids are DETECTED. The SARS-CoV-2 RNA is generally detectable in upper and lower  respiratory specimens dur ing the acute phase of infection.  Positive  results are indicative of active infection with SARS-CoV-2.  Clinical  correlation with patient history and other diagnostic information is  necessary to determine patient infection status.  Positive results do  not rule out bacterial infection or co-infection with other viruses. If result is PRESUMPTIVE POSTIVE SARS-CoV-2 nucleic acids MAY BE PRESENT.   A presumptive positive result was obtained on the submitted specimen  and confirmed on repeat testing.  While 2019 novel coronavirus  (SARS-CoV-2) nucleic acids may be present in the submitted sample  additional confirmatory testing may be necessary for epidemiological  and / or clinical management purposes  to  differentiate between  SARS-CoV-2 and other Sarbecovirus currently known to infect humans.  If clinically indicated additional testing with an alternate test  methodology [phone]) is advised. The SARS-CoV-2 RNA is generally  detectable in upper and lower respiratory sp ecimens during the acute  phase of infection. The expected result is Negative. Fact Sheet for Patients:  StrictlyIdeas.no Fact Sheet for Healthcare Providers: BankingDealers.co.za This test is not yet approved or cleared by the [place] FDA and has been authorized for detection and/or diagnosis of SARS-CoV-2 by FDA under an Emergency Use Authorization (EUA).  This EUA will remain  in effect (meaning this test can be used) for the duration of the COVID-19 declaration under Section 564(b)(1) of the Act, 21 U.S.C. section 360bbb-3(b)(1), unless the authorization is terminated or revoked sooner. Performed at [facility], [place] [address] [place], [place] [address]      Radiological Exams on Admission: Dg Chest 2 View  Result Date: 08/02/2018 CLINICAL DATA:  Chest pain EXAM: CHEST - 2 VIEW COMPARISON:  April 22, 2017. FINDINGS: The heart size is stable. The lungs are somewhat hyperexpanded. There appear to be emphysematous changes bilaterally with scattered areas of pleuroparenchymal scarring. There are degenerative changes throughout the visualized thoracic spine without evidence of an acute displaced fracture. There are degenerative changes of both glenohumeral joints. There are aortic calcifications. There are coronary artery calcifications versus coronary artery stents. IMPRESSION: No active cardiopulmonary disease. Electronically Signed   By: Constance Holster M.D.   On: 08/02/2018 14:04   Ct Angio Chest/abd/pel For Dissection W And/or Wo Contrast  Result Date: 08/02/2018 CLINICAL DATA:  Mid chest pain.  Back pain. EXAM: CT ANGIOGRAPHY CHEST, ABDOMEN AND PELVIS  TECHNIQUE: Multidetector CT imaging through the chest, abdomen and pelvis was performed using the standard protocol during bolus administration of intravenous contrast. Multiplanar reconstructed images and MIPs were obtained and reviewed to evaluate the vascular anatomy. CONTRAST:  151mL OMNIPAQUE IOHEXOL 350 MG/ML SOLN COMPARISON:  CT chest 04/22/2017 FINDINGS: CTA CHEST FINDINGS Cardiovascular: The initial noncontrast images demonstrate no evidence of acute intramural hematoma. Coronary, aortic arch, and branch vessel atherosclerotic vascular disease. No thoracic aortic dissection is identified. The the branch vessels appear widely patent and both vertebral arteries appear patent in the upper thorax. Although today's exam was optimized for systemic and not pulmonary arterial opacification, the pulmonary arteries are well opacified and no filling defect is identified in the pulmonary arterial tree. Mediastinum/Nodes: Right lower paratracheal node 1.0 cm in short axis on image 55/4, previously the same on 04/22/2017. Left lower paratracheal node 0.9 cm in short axis on image 49/7, likewise stable. No overtly pathologic adenopathy in the chest. Lungs/Pleura: Biapical pleuroparenchymal scarring, stable. Peripheral interstitial accentuation in both lungs. Mild airway plugging anteriorly in the left upper lobe shown on image 69/8. Mild scarring posteriorly in the right upper lobe adjacent to the major fissure. Musculoskeletal: Asymmetric degenerative sternoclavicular arthropathy, right greater than left. Mild thoracic kyphosis and moderate spondylosis. Review of the MIP images confirms the above findings. CTA ABDOMEN AND PELVIS FINDINGS VASCULAR Aorta: Aortoiliac atherosclerotic vascular disease. No aneurysm or dissection. Celiac: Atheromatous plaque along the upper margin of the origin but the celiac artery appears patent and eventually of psoriasis to the proper hepatic artery. SMA: There is some atheromatous plaque  about 2.4 cm from the origin but the SMA remains widely patent. Renals: Single bilateral renal arteries appear patent although there is some early branching of the left renal artery. IMA: Patent. Inflow: Atheromatous calcification with some chronic mural thrombus along the right common iliac artery. Veins: The veins are not significantly opacified with contrast. No discrete abnormality is identified. Review of the MIP images confirms the above findings. NON-VASCULAR Hepatobiliary: The arterial phase appearance of the liver is unremarkable. The gallbladder is mildly obscured by motion artifact but without discrete abnormality. Pancreas: Partially obscured by motion artifact but otherwise unremarkable. Spleen: Unremarkable Adrenals/Urinary Tract: Fullness of the left adrenal gland without discrete mass. Small hypodense renal lesions are technically too small to characterize but most likely represent cysts. 2.6 by 2.1 cm hypodense lesion of the  left mid kidney posteriorly on image 147/7 is slightly higher than fluid density, most likely a complex cyst. 1.9 by 1.7 cm hypodense lesion of the left kidney lower pole, fluid density and likely a cyst. Approximately 1.6 by 1.3 cm hypodense lesion of the right mid kidney medially, probably a cyst although slightly higher than fluid density. Stomach/Bowel: Sigmoid colon diverticulosis. Lymphatic: No pathologic adenopathy is identified. Reproductive: The right testicle appears to be retracted up nearly to the inguinal ring. Prostate gland partially obscured by streak artifact from the patient's right hip prosthesis but appears grossly unremarkable. Other: No supplemental non-categorized findings. Musculoskeletal: Right total hip prosthesis. Bridging spurring of both sacroiliac joints. Grade 1 degenerative anterolisthesis at L4-5 with right foraminal impingement at L3-4 and L4-5 and left foraminal impingement at L3-4 primarily from facet spurring. Review of the MIP images  confirms the above findings. IMPRESSION: 1. No aortic dissection or acute vascular findings. No findings of pulmonary embolus in the lungs. 2. Other imaging findings of potential clinical significance: Aortic Atherosclerosis (ICD10-I70.0). Coronary atherosclerosis. Mild peripheral interstitial accentuation in both lungs, query mild fibrosis. Minimal airway plugging anteriorly in the left upper lobe. Thoracic kyphosis with spondylosis. Lumbar impingement at L3-4 and L4-5. Bilateral hypodense renal lesions are likely cysts although only characterized during a single phase today, and does not entirely specific. Sigmoid colon diverticulosis. Retracted right testicle nearly back to the inguinal ring. Electronically Signed   By: Van Clines M.D.   On: 08/02/2018 17:37    EKG: Independently reviewed. Sinus rhythm, rate 53, LAFB, QTc 422 ms.   Assessment/Plan   1. Epigastric pain, hyperbilirubinemia  - Presents for "chest pain" but points to epigastrium and is tender in epigastrium and RUQ, was ruled-out for dissection, PE, or MI in ED - No definite biliary abnormality on CTA c/a/p but assessment limited by motion-artifact  - Total bilirubin is 2.6, previously normal  - Check RUQ [place], keep NPO for now, continue pain-control, repeat LFT's in am    2. CAD  - Presents for "chest pain" but points to epigastrium and is tender in epigastrium and RUQ, was ruled-out for dissection, PE, or MI in ED  3. Chronic combined systolic & diastolic CHF  - Appears compensated  - Hydrating gently while NPO, will follow daily wt and I/O's    4. Hypertension with hypertensive urgency  - BP as high as 210/100 in ED, improved to 180/100 with labetalol IVP  - CTA c/a/p without dissection or acute vascular finding - Pain is likely contributing  - Continue pain-control, use hydralazine IVP's as needed     PPE: Mask, face shield. Patient wearing mask.  DVT prophylaxis: sq heparin  Code Status: Full  Family  Communication: Discussed with patient  Consults called: None Admission status: Observation     Vianne Bulls, MD [facility] Pager [phone]  If 7PM-7AM, please contact night-coverage [email or web address] Password [facility]  08/02/2018, 7:36 PM

## 2018-08-02 NOTE — ED Triage Notes (Signed)
Pt arrived [facility] from [facility]. Reports having episode of mid chest pain today. Denies sob. Pt took nitro prior to ems arrival which relieved his pain.

## 2018-08-02 NOTE — ED Notes (Signed)
Patient transported to Ultrasound 

## 2018-08-03 ENCOUNTER — Other Ambulatory Visit: Payer: Self-pay

## 2018-08-03 DIAGNOSIS — Z9079 Acquired absence of other genital organ(s): Secondary | ICD-10-CM | POA: Diagnosis not present

## 2018-08-03 DIAGNOSIS — Z955 Presence of coronary angioplasty implant and graft: Secondary | ICD-10-CM | POA: Diagnosis not present

## 2018-08-03 DIAGNOSIS — I110 Hypertensive heart disease with heart failure: Secondary | ICD-10-CM | POA: Diagnosis present

## 2018-08-03 DIAGNOSIS — N401 Enlarged prostate with lower urinary tract symptoms: Secondary | ICD-10-CM | POA: Diagnosis present

## 2018-08-03 DIAGNOSIS — I2510 Atherosclerotic heart disease of native coronary artery without angina pectoris: Secondary | ICD-10-CM | POA: Diagnosis present

## 2018-08-03 DIAGNOSIS — Z7982 Long term (current) use of aspirin: Secondary | ICD-10-CM | POA: Diagnosis not present

## 2018-08-03 DIAGNOSIS — E785 Hyperlipidemia, unspecified: Secondary | ICD-10-CM | POA: Diagnosis present

## 2018-08-03 DIAGNOSIS — I160 Hypertensive urgency: Secondary | ICD-10-CM | POA: Diagnosis present

## 2018-08-03 DIAGNOSIS — R17 Unspecified jaundice: Secondary | ICD-10-CM | POA: Diagnosis present

## 2018-08-03 DIAGNOSIS — Z1159 Encounter for screening for other viral diseases: Secondary | ICD-10-CM | POA: Diagnosis not present

## 2018-08-03 DIAGNOSIS — I444 Left anterior fascicular block: Secondary | ICD-10-CM | POA: Diagnosis present

## 2018-08-03 DIAGNOSIS — M2550 Pain in unspecified joint: Secondary | ICD-10-CM | POA: Diagnosis not present

## 2018-08-03 DIAGNOSIS — N179 Acute kidney failure, unspecified: Secondary | ICD-10-CM | POA: Diagnosis present

## 2018-08-03 DIAGNOSIS — R1013 Epigastric pain: Secondary | ICD-10-CM | POA: Diagnosis not present

## 2018-08-03 DIAGNOSIS — R262 Difficulty in walking, not elsewhere classified: Secondary | ICD-10-CM | POA: Diagnosis not present

## 2018-08-03 DIAGNOSIS — Z7401 Bed confinement status: Secondary | ICD-10-CM | POA: Diagnosis not present

## 2018-08-03 DIAGNOSIS — R338 Other retention of urine: Secondary | ICD-10-CM | POA: Diagnosis present

## 2018-08-03 DIAGNOSIS — Z96641 Presence of right artificial hip joint: Secondary | ICD-10-CM | POA: Diagnosis present

## 2018-08-03 DIAGNOSIS — Z79899 Other long term (current) drug therapy: Secondary | ICD-10-CM | POA: Diagnosis not present

## 2018-08-03 DIAGNOSIS — Z85828 Personal history of other malignant neoplasm of skin: Secondary | ICD-10-CM | POA: Diagnosis not present

## 2018-08-03 DIAGNOSIS — Z8249 Family history of ischemic heart disease and other diseases of the circulatory system: Secondary | ICD-10-CM | POA: Diagnosis not present

## 2018-08-03 DIAGNOSIS — R001 Bradycardia, unspecified: Secondary | ICD-10-CM | POA: Diagnosis present

## 2018-08-03 DIAGNOSIS — G3184 Mild cognitive impairment, so stated: Secondary | ICD-10-CM | POA: Diagnosis present

## 2018-08-03 DIAGNOSIS — I5042 Chronic combined systolic (congestive) and diastolic (congestive) heart failure: Secondary | ICD-10-CM | POA: Diagnosis present

## 2018-08-03 DIAGNOSIS — I252 Old myocardial infarction: Secondary | ICD-10-CM | POA: Diagnosis not present

## 2018-08-03 LAB — CBC WITH DIFFERENTIAL/PLATELET
Abs Immature Granulocytes: 0.04 10*3/uL (ref 0.00–0.07)
Basophils Absolute: 0 10*3/uL (ref 0.0–0.1)
Basophils Relative: 0 %
Eosinophils Absolute: 0 10*3/uL (ref 0.0–0.5)
Eosinophils Relative: 0 %
HCT: 40.8 % (ref 39.0–52.0)
Hemoglobin: 13.9 g/dL (ref 13.0–17.0)
Immature Granulocytes: 0 %
Lymphocytes Relative: 14 %
Lymphs Abs: 1.3 10*3/uL (ref 0.7–4.0)
MCH: 31.2 pg (ref 26.0–34.0)
MCHC: 34.1 g/dL (ref 30.0–36.0)
MCV: 91.5 fL (ref 80.0–100.0)
Monocytes Absolute: 1 10*3/uL (ref 0.1–1.0)
Monocytes Relative: 11 %
Neutro Abs: 7 10*3/uL (ref 1.7–7.7)
Neutrophils Relative %: 75 %
Platelets: 188 10*3/uL (ref 150–400)
RBC: 4.46 MIL/uL (ref 4.22–5.81)
RDW: 14 % (ref 11.5–15.5)
WBC: 9.4 10*3/uL (ref 4.0–10.5)
nRBC: 0 % (ref 0.0–0.2)

## 2018-08-03 LAB — COMPREHENSIVE METABOLIC PANEL
ALT: 19 U/L (ref 0–44)
AST: 28 U/L (ref 15–41)
Albumin: 3.2 g/dL — ABNORMAL LOW (ref 3.5–5.0)
Alkaline Phosphatase: 37 U/L — ABNORMAL LOW (ref 38–126)
Anion gap: 10 (ref 5–15)
BUN: 17 mg/dL (ref 8–23)
CO2: 23 mmol/L (ref 22–32)
Calcium: 9.1 mg/dL (ref 8.9–10.3)
Chloride: 104 mmol/L (ref 98–111)
Creatinine, Ser: 1.25 mg/dL — ABNORMAL HIGH (ref 0.61–1.24)
GFR calc Af Amer: 58 mL/min — ABNORMAL LOW (ref >60)
GFR calc non Af Amer: 50 mL/min — ABNORMAL LOW (ref >60)
Glucose, Bld: 138 mg/dL — ABNORMAL HIGH (ref 70–99)
Potassium: 4.5 mmol/L (ref 3.5–5.1)
Sodium: 137 mmol/L (ref 135–145)
Total Bilirubin: 1.5 mg/dL — ABNORMAL HIGH (ref 0.3–1.2)
Total Protein: 6.4 g/dL — ABNORMAL LOW (ref 6.5–8.1)

## 2018-08-03 LAB — LIPASE, BLOOD: Lipase: 27 U/L (ref 11–51)

## 2018-08-03 LAB — GLUCOSE, CAPILLARY: Glucose-Capillary: 110 mg/dL — ABNORMAL HIGH (ref 70–99)

## 2018-08-03 MED ORDER — ASPIRIN EC 81 MG PO TBEC
81.0000 mg | DELAYED_RELEASE_TABLET | Freq: Every day | ORAL | Status: DC
  Administered 2018-08-04: 81 mg via ORAL
  Filled 2018-08-03: qty 1

## 2018-08-03 MED ORDER — POLYETHYLENE GLYCOL 3350 17 G PO PACK
17.0000 g | PACK | Freq: Every day | ORAL | Status: DC
  Administered 2018-08-03 – 2018-08-04 (×2): 17 g via ORAL
  Filled 2018-08-03 (×2): qty 1

## 2018-08-03 MED ORDER — ATORVASTATIN CALCIUM 40 MG PO TABS
40.0000 mg | ORAL_TABLET | Freq: Every day | ORAL | Status: DC
  Administered 2018-08-03: 40 mg via ORAL
  Filled 2018-08-03: qty 1

## 2018-08-03 MED ORDER — SODIUM CHLORIDE 0.9 % IV SOLN
INTRAVENOUS | Status: AC
  Administered 2018-08-03: 10:00:00 via INTRAVENOUS

## 2018-08-03 MED ORDER — FINASTERIDE 5 MG PO TABS
5.0000 mg | ORAL_TABLET | Freq: Every day | ORAL | Status: DC
  Administered 2018-08-03 – 2018-08-04 (×2): 5 mg via ORAL
  Filled 2018-08-03 (×2): qty 1

## 2018-08-03 MED ORDER — PANTOPRAZOLE SODIUM 40 MG PO TBEC
40.0000 mg | DELAYED_RELEASE_TABLET | Freq: Every day | ORAL | Status: DC
  Administered 2018-08-03 – 2018-08-04 (×2): 40 mg via ORAL
  Filled 2018-08-03 (×2): qty 1

## 2018-08-03 MED ORDER — ASPIRIN 81 MG PO TBEC
81.0000 mg | DELAYED_RELEASE_TABLET | Freq: Every day | ORAL | Status: DC
  Administered 2018-08-03: 81 mg via ORAL
  Filled 2018-08-03 (×2): qty 1

## 2018-08-03 NOTE — Evaluation (Signed)
Physical Therapy Evaluation Patient Details Name: [name] MRN: [id number] DOB: 06-20-1923 Today's Date: 08/03/2018   History of Present Illness  Pt is a 85 y/o male admitted secondary to chest pain vs abdominal pain. Workup pending. Imaging negative for PE. PMH includes HTN, CAD, and mild cognitive deficits.   Clinical Impression  Pt admitted secondary to problem above with deficits below. Pt requiring min guard A for mobility using RW. Pt with 1 LOB requiring min A for recovery. Of note, pt's HR elevating to low 140s during ambulation. Notified RN. HR returned to high 80s upon return to supine. Pt currently lives at [place] with his wife. Feel pt would benefit from HHPT to address deficits at d/c. Will continue to follow acutely to maximize functional mobility independence and safety.     Follow Up Recommendations Home health PT;Supervision/Assistance - 24 hour    Equipment Recommendations  None recommended by PT    Recommendations for Other Services       Precautions / Restrictions Precautions Precautions: Fall Restrictions Weight Bearing Restrictions: No      Mobility  Bed Mobility Overal bed mobility: Needs Assistance Bed Mobility: Supine to Sit;Sit to Supine     Supine to sit: Min assist Sit to supine: Supervision   General bed mobility comments: Min A for trunk elevation. Supervision for return to supine.   Transfers Overall transfer level: Needs assistance Equipment used: Rolling walker (2 wheeled) Transfers: Sit to/from Stand Sit to Stand: Min guard         General transfer comment: Min guard for steadying assist to stand with RW.   Ambulation/Gait Ambulation/Gait assistance: Min guard;Min assist Gait Distance (Feet): 125 Feet Assistive device: Rolling walker (2 wheeled) Gait Pattern/deviations: Step-through pattern;Decreased stride length;Trunk flexed Gait velocity: Decreased    General Gait Details: Pt with 1 LOB initially, requiring min A for  steadying. With increased gait distance, pt with improved steadiness requiring min guard A for steadying. Pt's HR elevating to low 140s during gait, which limited further gait distance.   Stairs            Wheelchair Mobility    Modified Rankin (Stroke Patients Only)       Balance Overall balance assessment: Needs assistance Sitting-balance support: No upper extremity supported;Feet supported Sitting balance-Leahy Scale: Good     Standing balance support: Bilateral upper extremity supported;During functional activity Standing balance-Leahy Scale: Poor Standing balance comment: Reliant on BUE support                              Pertinent Vitals/Pain Pain Assessment: No/denies pain    Home Living Family/patient expects to be discharged to:: Private residence Living Arrangements: Spouse/significant other Available Help at Discharge: Family;Available 24 hours/day Type of Home: Independent living facility Home Access: Level entry;Elevator     Home Layout: One level Home Equipment: Clinical cytogeneticist - 4 wheels;Grab bars - tub/shower;Grab bars - toilet Additional Comments: Clarified home information with pt's wife as pt with mild cognitive deficits.     Prior Function Level of Independence: Independent with assistive device(s)         Comments: Uses rollator. Still driving, however, wife reports she is always with him.      Hand Dominance        Extremity/Trunk Assessment   Upper Extremity Assessment Upper Extremity Assessment: Defer to OT evaluation    Lower Extremity Assessment Lower Extremity Assessment: Generalized weakness    Cervical /  Trunk Assessment Cervical / Trunk Assessment: Normal  Communication   Communication: No difficulties  Cognition Arousal/Alertness: Awake/alert Behavior During Therapy: WFL for tasks assessed/performed Overall Cognitive Status: History of cognitive impairments - at baseline                                  General Comments: Per wife, pt with mild cognitive deficits. Noted memory deficits during session.       General Comments      Exercises     Assessment/Plan    PT Assessment Patient needs continued PT services  PT Problem List Decreased strength;Decreased balance;Decreased cognition;Decreased knowledge of use of DME;Decreased knowledge of precautions       PT Treatment Interventions DME instruction;Gait training;Functional mobility training;Therapeutic activities;Therapeutic exercise;Balance training;Patient/family education    PT Goals (Current goals can be found in the Care Plan section)  Acute Rehab PT Goals Patient Stated Goal: for pt to return home PT Goal Formulation: With patient/family Time For Goal Achievement: 08/17/18 Potential to Achieve Goals: Good    Frequency Min 3X/week   Barriers to discharge Other (comment) Wife unable to physically assist     Co-evaluation               AM-PAC PT "6 Clicks" Mobility  Outcome Measure Help needed turning from your back to your side while in a flat bed without using bedrails?: A Little Help needed moving from lying on your back to sitting on the side of a flat bed without using bedrails?: A Little Help needed moving to and from a bed to a chair (including a wheelchair)?: A Little Help needed standing up from a chair using your arms (e.g., wheelchair or bedside chair)?: A Little Help needed to walk in hospital room?: A Little Help needed climbing 3-5 steps with a railing? : A Lot 6 Click Score: 17    End of Session Equipment Utilized During Treatment: Gait belt Activity Tolerance: Patient tolerated treatment well Patient left: in bed;with call bell/phone within reach;with bed alarm set Nurse Communication: Mobility status PT Visit Diagnosis: Unsteadiness on feet (R26.81);Muscle weakness (generalized) (M62.81)    Time: 5053-9767 PT Time Calculation (min) (ACUTE ONLY): 17 min   Charges:    PT Evaluation $PT Eval Low Complexity: [place], PT, DPT  Acute Rehabilitation Services  Pager: [phone] Office: [phone]   Rudean Hitt 08/03/2018, 5:42 PM

## 2018-08-03 NOTE — Discharge Summary (Signed)
[facility] PROGRESS NOTE  [name] OXB:[id number] DOB: [birth year] DOA: 08/02/2018 PCP: Ricky Anger, MD  P Abdominal >chest pain Liberalize diet to allow him to eat and monitor for symptoms Do not think he has angina-troponins negative EKG shows bradycardia without damage pattern Hold repeat echo Get EKG in the morning to notice if there are any changes Nursing aware to update me if there are any issues when resuming diet Continue ASA 81 Mild hyperbilirubinemia Transient-RUQ [place] = negative with normal sized CBD Repeat LFTs a.m. HFpEF with pseudonormalization Hold lisinopril 5 twice daily stop Continue Zocor 80-question mortality benefit in a 85 year old? Reflux? 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, [employer] CAD BMS circumflex 2002-unstable angina 2007+ DES HFrEF 45-50% 04/22/2017 BPH Chronic bradycardia-discontinued Coreg 6/23 Went to PCP office 6/24With reflux-like symptoms-wife notices over the past week and it happened a couple of times so she gave him nitro and he got better and because of the symptoms and his heart issues he decided to come and get worked up at the hospital In the ED he obtained CTA stomach chest with no acute findings-bilirubin was slightly high lipase was normal  DVT Lovenox Code Status: Full Communication: Long discussion with wife on the phone who [name] has 2 daughters 1 lives in [place] 1 lives in [place]- Disposition Plan: Probably return to skilled facility tomorrow if tolerates a full diet-May need outpatient follow-up with GI if continues to have issues without overt bleeding   Ricky Au, MD  [facility] Via [employer] app OR [email or web address] 7PM-7AM contact night coverage as above 08/03/2018, 10:38 AM  LOS: 0 days   ---  Consultants:  None  Procedures:  No  Antimicrobials:  no --- Today Awake pleasant coherent alert can tell me date time-did not sleep much last night No fever States he thinks he can eat feels somewhat hungry No vomiting overnight no diarrhea  O  Vitals:  Vitals:   08/02/18 2155 08/03/18 0456  BP: (!) 179/68 (!) 157/70  Pulse: (!) 51 (!) 55  Resp: 17 18  Temp: 98.1 F (36.7 C) 97.7 F (36.5 C)  SpO2: 99% 95%    Exam:  EOMI NCAT No JVD no carotid bruit Chest clinically clear no added sound A8-T4 soft holosystolic murmur 3/6 Abdomen soft points to lower quadrant when asked where his pain was No lower extremity edema Musculoskeletal able to raise limbs off of bed without deficit Skin has no rashes Euthymic congruent pleasant   I have personally reviewed the following:   DATA BUN/creatinine 17/1.25 baseline 17/1.1 Albumin 3.2 lipase 27 Total bili down from 2.6-1.5 Hemoglobin 13.9 same as prior   Scheduled Meds: . aspirin EC  81 mg Oral Daily  . atorvastatin  40 mg Oral q1800  . finasteride  5 mg Oral Daily  . heparin  5,000 Units Subcutaneous Q8H  . pantoprazole  40 mg Oral Daily  . polyethylene glycol  17 g Oral Daily  . sodium chloride flush  3 mL Intravenous Once  . sodium chloride flush  3 mL Intravenous Q12H   Continuous Infusions: . sodium chloride 50 mL/hr at 08/03/18 0944    Principal Problem:   Abdominal pain, acute, epigastric Active Problems:   Essential hypertension   Coronary atherosclerosis   Chronic combined systolic and diastolic CHF (congestive heart failure) (HCC)   Hypertensive urgency   Hyperbilirubinemia  LOS: 0 days

## 2018-08-03 NOTE — Progress Notes (Signed)
Spoke with wife [name] on the phone regarding patient and updated her on his night.

## 2018-08-03 NOTE — Plan of Care (Signed)
°  Problem: Clinical Measurements: °Goal: Will remain free from infection °Outcome: Progressing °  °Problem: Coping: °Goal: Level of anxiety will decrease °Outcome: Progressing °  °Problem: Elimination: °Goal: Will not experience complications related to bowel motility °Outcome: Progressing °Goal: Will not experience complications related to urinary retention °Outcome: Progressing °  °Problem: Pain Managment: °Goal: General experience of comfort will improve °Outcome: Progressing °  °

## 2018-08-03 NOTE — Progress Notes (Signed)
Spoke with grand daughter [name] regarding patient and updated her on his day.

## 2018-08-04 LAB — GLUCOSE, CAPILLARY: Glucose-Capillary: 124 mg/dL — ABNORMAL HIGH (ref 70–99)

## 2018-08-04 MED ORDER — ALPRAZOLAM 0.25 MG PO TABS
0.2500 mg | ORAL_TABLET | Freq: Once | ORAL | Status: AC
  Administered 2018-08-04: 0.25 mg via ORAL
  Filled 2018-08-04: qty 1

## 2018-08-04 MED ORDER — HALOPERIDOL LACTATE 5 MG/ML IJ SOLN: 2.0000 mg | Freq: Once | INTRAMUSCULAR | Status: DC

## 2018-08-04 MED ORDER — TAMSULOSIN HCL 0.4 MG PO CAPS
0.4000 mg | ORAL_CAPSULE | Freq: Every day | ORAL | Status: DC
  Administered 2018-08-04: 0.4 mg via ORAL
  Filled 2018-08-04: qty 1

## 2018-08-04 MED ORDER — TAMSULOSIN HCL 0.4 MG PO CAPS: 0.4000 mg | ORAL_CAPSULE | Freq: Every day | ORAL | 3 refills | Status: DC

## 2018-08-04 NOTE — Discharge Summary (Addendum)
Physician Discharge Summary  [name] KDX:[id number] DOB: 06-09-1923 DOA: 08/02/2018  PCP: Cassandria Anger, MD  Admit date: 08/02/2018 Discharge date: 08/04/2018  Time spent: 25 minutes  Recommendations for Outpatient Follow-up:  1. Will need outpatient [facility]-I have sent an inbox message to them to coordinate 2. New medication Flomax, discontinue lisinopril this hospital stay 3. Will need labs in about 1 week  Discharge Diagnoses:  Principal Problem:   Abdominal pain, acute, epigastric Active Problems:   Essential hypertension   Coronary atherosclerosis   Chronic combined systolic and diastolic CHF (congestive heart failure) (HCC)   Hypertensive urgency   Hyperbilirubinemia   Discharge Condition: nad  Diet recommendation: reg  Filed Weights   08/03/18 0244 08/04/18 0432  Weight: 66.5 kg 63.3 kg    History of present illness:  Abdominal pain for retention of urine Liberalize diet to allow him to eat and monitor for symptoms Do not think he has angina-troponins negative EKG shows bradycardia without damage pattern Hold repeat echo Continue ASA 81 Mild hyperbilirubinemia Transient-RUQ [place] = negative with normal sized CBD LFT's normalized HFpEF with pseudonormalization Hold lisinopril 5 twice daily stop 2/2 AKI on admit Continue Zocor 80-question mortality benefit in a 85 year old? Reflux? Increase Protonix to 40 twice daily-reassess for effect if more abdominal pain today, would add Carafate BPH Continue Proscar 5 daily---added Flomax this admit--He was retaining urine--I think this is his cause for abd pain-despite in and out cath he retain 345 cc We will need to ensure he has proper home health and management at facility  Consultations:  Telephone consulted Dr. Jeffie Pollock of urology who can arrange for outpatient catheter follow-up-we will need education as an outpatient with regards to leg bag and ambulation with safety  measures  Discharge Exam: Vitals:   08/03/18 1736 08/03/18 2027  BP: (!) 182/75 (!) 124/99  Pulse: 66 (!) 56  Resp: 16 16  Temp:  98.5 F (36.9 C)  SpO2: 100% 97%    General: Awake alert coherent no distress was confused overnight but seems to orient a little better this morning Cardiovascular: S1-S2 no murmur rub or gallop no rales no rhonchi regular rate rhythm did have sound tachycardia overnight Respiratory: Clinically clear no added sound no rales no rhonchi Abdomen soft tender except in lower quadrant   Discharge Instructions   Discharge Instructions    Diet - low sodium heart healthy   Complete by: As directed    Discharge instructions   Complete by: As directed    You might need to go home with a catheter-we will ask home health to come by and help you manage it and you may need to use something called a leg bag I have forwarded your concerns to urologist Dr. Jeffie Pollock and he will ensure that if you control your catheter this can be done in the office at [facility] urology in about 2 weeks You will need lab work in about 3 to 4 days to make sure that your kidneys are still looking good Noticed that we have discontinued some of your blood pressure medications which can be reimplemented based on your labs   Increase activity slowly   Complete by: As directed      Allergies as of 08/04/2018      Reactions   Oxycodone Nausea And Vomiting   Penicillins Hives      Medication List    STOP taking these medications   lisinopril 10 MG tablet Commonly known as: ZESTRIL  TAKE these medications   aspirin 81 MG EC tablet Take 81 mg by mouth daily.   b complex vitamins tablet Take 1 tablet by mouth daily.   Cholecalciferol 25 MCG (1000 UT) tablet Take 1,000 Units by mouth daily.   finasteride 5 MG tablet Commonly known as: Proscar Take 1 tablet (5 mg total) by mouth daily.   GLUCOSAMINE 1500 COMPLEX PO Take 1 tablet by mouth 2 (two) times daily.   nitroGLYCERIN  0.4 MG SL tablet Commonly known as: NITROSTAT Place 1 tablet (0.4 mg total) under the tongue every 5 (five) minutes as needed for chest pain (Please call your doctor/911 if no improvement after the second dose).   pantoprazole 40 MG tablet Commonly known as: PROTONIX Take 1 tablet (40 mg total) by mouth daily.   polyethylene glycol 17 g packet Commonly known as: MIRALAX / GLYCOLAX Take 17 g by mouth daily.   simvastatin 80 MG tablet Commonly known as: ZOCOR Take 1 tablet (80 mg total) by mouth daily.   tamsulosin 0.4 MG Caps capsule Commonly known as: FLOMAX Take 1 capsule (0.4 mg total) by mouth daily after breakfast. Start taking on: August 05, 2018   vitamin C 500 MG tablet Commonly known as: ASCORBIC ACID Take 500 mg by mouth daily.      Allergies  Allergen Reactions  . Oxycodone Nausea And Vomiting  . Penicillins Hives      The results of significant diagnostics from this hospitalization (including imaging, microbiology, ancillary and laboratory) are listed below for reference.    Significant Diagnostic Studies: Dg Chest 2 View  Result Date: 08/02/2018 CLINICAL DATA:  Chest pain EXAM: CHEST - 2 VIEW COMPARISON:  April 22, 2017. FINDINGS: The heart size is stable. The lungs are somewhat hyperexpanded. There appear to be emphysematous changes bilaterally with scattered areas of pleuroparenchymal scarring. There are degenerative changes throughout the visualized thoracic spine without evidence of an acute displaced fracture. There are degenerative changes of both glenohumeral joints. There are aortic calcifications. There are coronary artery calcifications versus coronary artery stents. IMPRESSION: No active cardiopulmonary disease. Electronically Signed   By: Constance Holster M.D.   On: 08/02/2018 14:04   Ct Angio Chest/abd/pel For Dissection W And/or Wo Contrast  Result Date: 08/02/2018 CLINICAL DATA:  Mid chest pain.  Back pain. EXAM: CT ANGIOGRAPHY CHEST, ABDOMEN AND  PELVIS TECHNIQUE: Multidetector CT imaging through the chest, abdomen and pelvis was performed using the standard protocol during bolus administration of intravenous contrast. Multiplanar reconstructed images and MIPs were obtained and reviewed to evaluate the vascular anatomy. CONTRAST:  151mL OMNIPAQUE IOHEXOL 350 MG/ML SOLN COMPARISON:  CT chest 04/22/2017 FINDINGS: CTA CHEST FINDINGS Cardiovascular: The initial noncontrast images demonstrate no evidence of acute intramural hematoma. Coronary, aortic arch, and branch vessel atherosclerotic vascular disease. No thoracic aortic dissection is identified. The the branch vessels appear widely patent and both vertebral arteries appear patent in the upper thorax. Although today's exam was optimized for systemic and not pulmonary arterial opacification, the pulmonary arteries are well opacified and no filling defect is identified in the pulmonary arterial tree. Mediastinum/Nodes: Right lower paratracheal node 1.0 cm in short axis on image 55/4, previously the same on 04/22/2017. Left lower paratracheal node 0.9 cm in short axis on image 49/7, likewise stable. No overtly pathologic adenopathy in the chest. Lungs/Pleura: Biapical pleuroparenchymal scarring, stable. Peripheral interstitial accentuation in both lungs. Mild airway plugging anteriorly in the left upper lobe shown on image 69/8. Mild scarring posteriorly in the right upper  lobe adjacent to the major fissure. Musculoskeletal: Asymmetric degenerative sternoclavicular arthropathy, right greater than left. Mild thoracic kyphosis and moderate spondylosis. Review of the MIP images confirms the above findings. CTA ABDOMEN AND PELVIS FINDINGS VASCULAR Aorta: Aortoiliac atherosclerotic vascular disease. No aneurysm or dissection. Celiac: Atheromatous plaque along the upper margin of the origin but the celiac artery appears patent and eventually of psoriasis to the proper hepatic artery. SMA: There is some atheromatous  plaque about 2.4 cm from the origin but the SMA remains widely patent. Renals: Single bilateral renal arteries appear patent although there is some early branching of the left renal artery. IMA: Patent. Inflow: Atheromatous calcification with some chronic mural thrombus along the right common iliac artery. Veins: The veins are not significantly opacified with contrast. No discrete abnormality is identified. Review of the MIP images confirms the above findings. NON-VASCULAR Hepatobiliary: The arterial phase appearance of the liver is unremarkable. The gallbladder is mildly obscured by motion artifact but without discrete abnormality. Pancreas: Partially obscured by motion artifact but otherwise unremarkable. Spleen: Unremarkable Adrenals/Urinary Tract: Fullness of the left adrenal gland without discrete mass. Small hypodense renal lesions are technically too small to characterize but most likely represent cysts. 2.6 by 2.1 cm hypodense lesion of the left mid kidney posteriorly on image 147/7 is slightly higher than fluid density, most likely a complex cyst. 1.9 by 1.7 cm hypodense lesion of the left kidney lower pole, fluid density and likely a cyst. Approximately 1.6 by 1.3 cm hypodense lesion of the right mid kidney medially, probably a cyst although slightly higher than fluid density. Stomach/Bowel: Sigmoid colon diverticulosis. Lymphatic: No pathologic adenopathy is identified. Reproductive: The right testicle appears to be retracted up nearly to the inguinal ring. Prostate gland partially obscured by streak artifact from the patient's right hip prosthesis but appears grossly unremarkable. Other: No supplemental non-categorized findings. Musculoskeletal: Right total hip prosthesis. Bridging spurring of both sacroiliac joints. Grade 1 degenerative anterolisthesis at L4-5 with right foraminal impingement at L3-4 and L4-5 and left foraminal impingement at L3-4 primarily from facet spurring. Review of the MIP images  confirms the above findings. IMPRESSION: 1. No aortic dissection or acute vascular findings. No findings of pulmonary embolus in the lungs. 2. Other imaging findings of potential clinical significance: Aortic Atherosclerosis (ICD10-I70.0). Coronary atherosclerosis. Mild peripheral interstitial accentuation in both lungs, query mild fibrosis. Minimal airway plugging anteriorly in the left upper lobe. Thoracic kyphosis with spondylosis. Lumbar impingement at L3-4 and L4-5. Bilateral hypodense renal lesions are likely cysts although only characterized during a single phase today, and does not entirely specific. Sigmoid colon diverticulosis. Retracted right testicle nearly back to the inguinal ring. Electronically Signed   By: Van Clines M.D.   On: 08/02/2018 17:37   US Abdomen Limited Ruq  Result Date: 08/02/2018 CLINICAL DATA:  Epigastric pain EXAM: ULTRASOUND ABDOMEN LIMITED RIGHT UPPER QUADRANT COMPARISON:  None. FINDINGS: Gallbladder: No gallstones or wall thickening visualized. No sonographic Murphy sign noted by sonographer. Common bile duct: Diameter: 0.5 cm Liver: No focal lesion identified. Within normal limits in parenchymal echogenicity. Portal vein is patent on color Doppler imaging with normal direction of blood flow towards the liver. Incidentally noted is a small volume of abdominal ascites. IMPRESSION: 1. No sonographic evidence for cholelithiasis or acute cholecystitis. 2. Trace ascites noted. Electronically Signed   By: Constance Holster M.D.   On: 08/02/2018 20:38    Microbiology: Recent Results (from the past 240 hour(s))  SARS Coronavirus 2 (CEPHEID - Performed in [place]  hospital lab), Hosp Order     Status: None   Collection Time: 08/02/18  5:51 PM   Specimen: Nasopharyngeal Swab  Result Value Ref Range Status   SARS Coronavirus 2 NEGATIVE NEGATIVE Final    Comment: (NOTE) If result is NEGATIVE SARS-CoV-2 target nucleic acids are NOT DETECTED. The SARS-CoV-2 RNA is  generally detectable in upper and lower  respiratory specimens during the acute phase of infection. The lowest  concentration of SARS-CoV-2 viral copies this assay can detect is 250  copies / mL. A negative result does not preclude SARS-CoV-2 infection  and should not be used as the sole basis for treatment or other  patient management decisions.  A negative result may occur with  improper specimen collection / handling, submission of specimen other  than nasopharyngeal swab, presence of viral mutation(s) within the  areas targeted by this assay, and inadequate number of viral copies  (<250 copies / mL). A negative result must be combined with clinical  observations, patient history, and epidemiological information. If result is POSITIVE SARS-CoV-2 target nucleic acids are DETECTED. The SARS-CoV-2 RNA is generally detectable in upper and lower  respiratory specimens dur ing the acute phase of infection.  Positive  results are indicative of active infection with SARS-CoV-2.  Clinical  correlation with patient history and other diagnostic information is  necessary to determine patient infection status.  Positive results do  not rule out bacterial infection or co-infection with other viruses. If result is PRESUMPTIVE POSTIVE SARS-CoV-2 nucleic acids MAY BE PRESENT.   A presumptive positive result was obtained on the submitted specimen  and confirmed on repeat testing.  While 2019 novel coronavirus  (SARS-CoV-2) nucleic acids may be present in the submitted sample  additional confirmatory testing may be necessary for epidemiological  and / or clinical management purposes  to differentiate between  SARS-CoV-2 and other Sarbecovirus currently known to infect humans.  If clinically indicated additional testing with an alternate test  methodology [phone]) is advised. The SARS-CoV-2 RNA is generally  detectable in upper and lower respiratory sp ecimens during the acute  phase of  infection. The expected result is Negative. Fact Sheet for Patients:  StrictlyIdeas.no Fact Sheet for Healthcare Providers: BankingDealers.co.za This test is not yet approved or cleared by the [place] FDA and has been authorized for detection and/or diagnosis of SARS-CoV-2 by FDA under an Emergency Use Authorization (EUA).  This EUA will remain in effect (meaning this test can be used) for the duration of the COVID-19 declaration under Section 564(b)(1) of the Act, 21 U.S.C. section 360bbb-3(b)(1), unless the authorization is terminated or revoked sooner. Performed at [facility], [place] [address] [place], [place] [address]      Labs: Basic Metabolic Panel: Recent Labs  Lab 08/02/18 1337 08/03/18 0533  NA 135 137  K 4.4 4.5  CL 102 104  CO2 23 23  GLUCOSE 91 138*  BUN 17 17  CREATININE 1.22 1.25*  CALCIUM 9.4 9.1   Liver Function Tests: Recent Labs  Lab 08/02/18 1748 08/03/18 0533  AST 62* 28  ALT 25 19  ALKPHOS 34* 37*  BILITOT 2.6* 1.5*  PROT 6.9 6.4*  ALBUMIN 3.8 3.2*   Recent Labs  Lab 08/02/18 1748 08/03/18 0533  LIPASE 31 27   No results for input(s): AMMONIA in the last 168 hours. CBC: Recent Labs  Lab 08/02/18 1337 08/03/18 0533  WBC 7.1 9.4  NEUTROABS  --  7.0  HGB 13.9 13.9  HCT 42.1  40.8  MCV 93.8 91.5  PLT 188 188   Cardiac Enzymes: No results for input(s): CKTOTAL, CKMB, CKMBINDEX, TROPONINI in the last 168 hours. BNP: BNP (last 3 results) No results for input(s): BNP in the last 8760 hours.  ProBNP (last 3 results) No results for input(s): PROBNP in the last 8760 hours.  CBG: Recent Labs  Lab 08/03/18 0840 08/04/18 0754  GLUCAP 110* 124*       Signed:  Nita Sells MD   [facility] 08/04/2018, 10:10 AM

## 2018-08-04 NOTE — TOC Initial Note (Signed)
Transition of Care [facility]) - Initial/Assessment Note    Patient Details  Name: [name] MRN: [id number] Date of Birth: May 11, 1923  Transition of Care [facility]) CM/SW Contact:    Alexander Mt, [place] Phone Number: 08/04/2018, 10:25 AM  Clinical Narrative:                 CSW spoke with pt at bedside. Introduced self, role, reason for visit. Pt pleasant, focused on getting his alert button untangled from gown. He lives with his wife [name] and states he has children and grandchildren in the area. Pt lives in independent living at [facility] and states he enjoys it. Pt understands recommendation for home health and is amenable to services. Pt may have to have a foley which would require [facility] care.   Pt gives permission for CSW to contact [place], his wife [name] and granddaughter [name] as needed. CSW contacted [facility] who has been talking to his wife, if pt needs a foley they may prefer for him to dc to SNF side of [place] before returning home. Will f/u with care plans and arrange dc as appropriate.   Expected Discharge Plan: [place] Barriers to Discharge: Continued Medical Work up   Patient Goals and CMS Choice Patient states their goals for this hospitalization and ongoing recovery are:: I want to go home CMS Medicare.gov Compare Post Acute Care list provided to:: Patient Choice offered to / list presented to : Patient  Expected Discharge Plan and Services Expected Discharge Plan: [place] In-house Referral: Clinical Social Work Discharge Planning Services: CM Consult Post Acute Care Choice: [place] arrangements for the past 2 months: [place] Expected Discharge Date: 08/04/18                         HH Arranged: PT, OT, RN          Prior Living Arrangements/Services Living arrangements for the past 2 months: [place] Lives with:: Spouse Patient language and need for interpreter  reviewed:: Yes(no needs) Do you feel safe going back to the place where you live?: Yes      Need for Family Participation in Patient Care: Yes (Comment)(assistance; supervision) Care giver support system in place?: Yes (comment)(wife; facility resident) Current home services: DME Criminal Activity/Legal Involvement Pertinent to Current Situation/Hospitalization: No - Comment as needed  Activities of Daily Living Home Assistive Devices/Equipment: Gilford Rile (specify type) ADL Screening (condition at time of admission) Patient's cognitive ability adequate to safely complete daily activities?: Yes Is the patient deaf or have difficulty hearing?: No Does the patient have difficulty seeing, even when wearing glasses/contacts?: No Does the patient have difficulty concentrating, remembering, or making decisions?: Yes Patient able to express need for assistance with ADLs?: Yes Does the patient have difficulty dressing or bathing?: Yes Independently performs ADLs?: No Communication: Independent Dressing (OT): Needs assistance Is this a change from baseline?: Pre-admission baseline Grooming: Needs assistance Is this a change from baseline?: Pre-admission baseline Feeding: Independent Bathing: Needs assistance Is this a change from baseline?: Pre-admission baseline Toileting: Needs assistance Is this a change from baseline?: Pre-admission baseline In/Out Bed: Needs assistance Is this a change from baseline?: Pre-admission baseline Does the patient have difficulty walking or climbing stairs?: No Weakness of Legs: Both Weakness of Arms/Hands: None  Permission Sought/Granted Permission sought to share information with : Facility Sport and exercise psychologist, Family Supports Permission granted to share information with : Yes, Verbal Permission  Granted  Share Information with NAME: [name]  Permission granted to share info w AGENCY: [place] granted to share info w Relationship:  granddaughter  Permission granted to share info w Contact Information: [phone]  Emotional Assessment Appearance:: Appears stated age Attitude/Demeanor/Rapport: Engaged Affect (typically observed): Accepting, Adaptable, Restless Orientation: : Oriented to Self, Oriented to Place, Oriented to  Time, Oriented to Situation Alcohol / Substance Use: Not Applicable Psych Involvement: No (comment)  Admission diagnosis:  Hyperbilirubinemia [E80.6] Epigastric pain [R10.13] Abdominal pain, acute, epigastric [R10.13] Hypertension, unspecified type [I10] Patient Active Problem List   Diagnosis Date Noted  . Abdominal pain, acute, epigastric 08/02/2018  . Hypertensive urgency 08/02/2018  . Hyperbilirubinemia 08/02/2018  . Hypertension   . Abdominal pain 07/13/2018  . Bradycardia 07/13/2018  . Orthostatic dizziness 12/15/2017  . Wrist pain, acute, right 10/14/2017  . Seborrheic keratoses 06/01/2017  . Chest pain 04/22/2017  . Fall 04/22/2017  . Gait disorder 11/03/2016  . Nausea 10/12/2016  . Hearing loss 11/25/2015  . Wart viral 04/28/2015  . 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 Situational depression 08/07/2013  . Well adult exam 08/02/2012  . Nausea with vomiting 03/28/2012  . HYPERGLYCEMIA 02/23/2010  . HYPERKALEMIA 10/21/2009  . Actinic keratosis 10/21/2009  . 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 PSA, INCREASED 04/22/2009  . MELANOMA, SCALP 01/02/2009  . NEOPLASM, SKIN, UNCERTAIN BEHAVIOR 83/10/4074  . CAROTID ARTERY STENOSIS, WITHOUT INFARCTION 05/06/2008  . Congestive heart failure ([place]) 04/29/2008  . Chronic combined systolic and diastolic CHF (congestive heart failure) ([place]) 04/29/2008  . Osteoarthritis 04/29/2008  . VERTIGO 03/11/2008  . Dyslipidemia 04/14/2007  . Essential hypertension 04/14/2007  . Coronary atherosclerosis 04/14/2007  . BPH (benign prostatic hyperplasia) 04/14/2007   PCP:  Cassandria Anger, MD Pharmacy:   [facility], [address] [place] [address] Phone: [phone] Fax: [phone]     Social Determinants of Health (SDOH) Interventions    Readmission Risk Interventions No flowsheet data found.

## 2018-08-04 NOTE — Social Work (Signed)
Clinical Social Worker facilitated patient discharge including contacting patient family and facility to confirm patient discharge plans.  Clinical information faxed to facility and family agreeable with plan.  CSW arranged ambulance transport via [place] to [facility] at 2:30pm. RN to call [phone]  with report prior to discharge.  Clinical Social Worker will sign off for now as social work intervention is no longer needed. Please consult [place] again if new need arises.  Westley Hummer, MSW, [place] Social Worker [phone]

## 2018-08-04 NOTE — Progress Notes (Signed)
Occupational Therapy Evaluation Patient Details Name: [name] MRN: [id number] DOB: [birth year] Today's Date: 08/04/2018    History of Present Illness Pt is a 85 y/o male admitted secondary to chest pain vs abdominal pain. Workup pending. Imaging negative for PE. PMH includes HTN, CAD, and mild cognitive deficits.    Clinical Impression   Pt admitted due to above deficits. PTA, pt lives at [facility] with his wife (who is always with him). Pt has baseline cognitive impairments. Pt very pleasant during session but difficult to follow in conversation (tangential conversation).  Pt able to complete ADLs and functional mobility at supervision-min guard level today. Recommend return home with wife and with [place] services. Will continue to follow acutely.    Follow Up Recommendations  Home health OT;Supervision/Assistance - 24 hour    Equipment Recommendations  None recommended by OT    Recommendations for Other Services       Precautions / Restrictions Precautions Precautions: Fall Restrictions Weight Bearing Restrictions: No      Mobility Bed Mobility Overal bed mobility: Needs Assistance Bed Mobility: Supine to Sit     Supine to sit: Supervision    General bed mobility comments: supervision for safety  Transfers Overall transfer level: Needs assistance Equipment used: Rolling walker (2 wheeled) Transfers: Sit to/from Stand Sit to Stand: Min guard         General transfer comment: Min guard for steadying. Cues for safe hand placement.    Balance Overall balance assessment: Needs assistance Sitting-balance support: No upper extremity supported;Feet supported Sitting balance-Leahy Scale: Good     Standing balance support: Bilateral upper extremity supported;During functional activity Standing balance-Leahy Scale: Poor Standing balance comment: Reliant on BUE support                            ADL either performed or assessed with clinical  judgement   ADL Overall ADL's : Needs assistance/impaired Eating/Feeding: Independent;Sitting   Grooming: Min guard;Standing   Upper Body Bathing: Supervision/ safety;Sitting   Lower Body Bathing: Supervison/ safety;Sitting/lateral leans   Upper Body Dressing : Supervision/safety;Sitting   Lower Body Dressing: Supervision/safety;Sitting/lateral leans Lower Body Dressing Details (indicate cue type and reason): Pt able to lean forward while sitting EOB and dons socks.  Toilet Transfer: Min guard;Ambulation;RW Toilet Transfer Details (indicate cue type and reason): simulated Toileting- Clothing Manipulation and Hygiene: Min guard;Sit to/from stand       Functional mobility during ADLs: Surveyor, minerals     Praxis      Pertinent Vitals/Pain Pain Assessment: No/denies pain     Hand Dominance Right   Extremity/Trunk Assessment Upper Extremity Assessment Upper Extremity Assessment: Generalized weakness;Overall [facility] for tasks assessed   Lower Extremity Assessment Lower Extremity Assessment: Defer to PT evaluation   Cervical / Trunk Assessment Cervical / Trunk Assessment: Normal   Communication Communication Communication: No difficulties   Cognition Arousal/Alertness: Awake/alert Behavior During Therapy: WFL for tasks assessed/performed Overall Cognitive Status: History of cognitive impairments - at baseline                                 General Comments: Pt able to state he is in hospital but does not know which hospital. Oriented to time and person, not situation. Pt very pleasant but tangential in conversation.  General Comments       Exercises     Shoulder Instructions      Home Living Family/patient expects to be discharged to:: Private residence Living Arrangements: Spouse/significant other Available Help at Discharge: Family;Available 24 hours/day Type of Home: Independent living  facility Home Access: Level entry;Elevator     Home Layout: One level     Bathroom Shower/Tub: Occupational psychologist: Handicapped height     Home Equipment: Clinical cytogeneticist - 4 wheels;Grab bars - tub/shower;Grab bars - toilet   Additional Comments: PT clarified home information with pt's wife as pt with mild cognitive deficits.       Prior Functioning/Environment Level of Independence: Independent with assistive device(s)        Comments: Uses rollator. Still driving, however, wife reports she is always with him.         OT Problem List: Decreased strength;Decreased activity tolerance;Impaired balance (sitting and/or standing);Decreased knowledge of use of DME or AE      OT Treatment/Interventions: Self-care/ADL training;DME and/or AE instruction;Therapeutic activities;Patient/family education;Balance training    OT Goals(Current goals can be found in the care plan section) Acute Rehab OT Goals Patient Stated Goal: for pt to return home OT Goal Formulation: With patient Time For Goal Achievement: 08/18/18 Potential to Achieve Goals: Good  OT Frequency: Min 2X/week   Barriers to D/C:            Co-evaluation              AM-PAC OT "6 Clicks" Daily Activity     Outcome Measure Help from another person eating meals?: None Help from another person taking care of personal grooming?: A Little(in standing) Help from another person toileting, which includes using toliet, bedpan, or urinal?: A Little Help from another person bathing (including washing, rinsing, drying)?: None Help from another person to put on and taking off regular upper body clothing?: None Help from another person to put on and taking off regular lower body clothing?: None 6 Click Score: 22   End of Session Equipment Utilized During Treatment: Gait belt;Rolling walker Nurse Communication: Mobility status(pt has pulled all of his telemetry leads off )  Activity Tolerance: Patient  tolerated treatment well Patient left: in chair;with call bell/phone within reach;with chair alarm set  OT Visit Diagnosis: Unsteadiness on feet (R26.81);Muscle weakness (generalized) (M62.81)                Time: 9450-3888 OT Time Calculation (min): 18 min Charges:  OT General Charges $OT Visit: 1 Visit OT Evaluation $OT Eval Moderate Complexity: 1 Mod    Darrol Jump OTR/L [place] [phone] 08/04/2018, 12:20 PM

## 2018-08-04 NOTE — TOC Transition Note (Signed)
Transition of Care [facility]) - CM/SW Discharge Note   Patient Details  Name: [name] MRN: [id number] Date of Birth: 1923-02-20  Transition of Care [facility]) CM/SW Contact:  Alexander Mt, [place] Phone Number: 08/04/2018, 11:53 AM   Clinical Narrative:    Pt and pt wife have decided d/c to [place] (SNF) at [facility] may be best given pt need for foley care. Pt wife aware pt stable for dc today.   Coordinated with Claiborne Billings, transitions coordinator at [employer] and we discussed dc. All information sent to [facility], no controlled medication scripts needed. PTAR papers will be placed on chart and PTAR scheduled.   Final next level of care: Skilled Nursing Facility Barriers to Discharge: Continued Medical Work up   Patient Goals and CMS Choice Patient states their goals for this hospitalization and ongoing recovery are:: I want to go home CMS Medicare.gov Compare Post Acute Care list provided to:: Patient Choice offered to / list presented to : Patient  Discharge Placement PASRR number recieved: 08/04/18            Patient chooses bed at: [facility] Patient to be transferred to facility by: [place] Name of family member notified: pt wife [name] Patient and family notified of of transfer: 08/04/18  Discharge Plan and Services In-house Referral: Clinical Social Work Discharge Planning Services: [employer] Consult Post Acute Care Choice: Home Health                    HH Arranged: PT, OT, RN          Social Determinants of Health (SDOH) Interventions     Readmission Risk Interventions No flowsheet data found.

## 2018-08-04 NOTE — Progress Notes (Signed)
Called [name] and let her know that he had left for [facility].  All DC papers sent with pt.

## 2018-08-04 NOTE — Progress Notes (Signed)
Pt lives at [facility] in [place].  He lives with his wife, [name]. I/O cathed for 820 ml just now,will start Flomax per Dr. [name]. Will recheck around 1100 and see if still retaining.

## 2018-08-04 NOTE — Progress Notes (Signed)
Foley inserted (16 F) per order for [facility].  Report called to [facility], had to leave message to return my call.

## 2018-08-04 NOTE — Progress Notes (Signed)
0130 Pt started to be confused and agitated, been getting up in bed 5x and removed his IV on left [facility], condom cath and his cardiac monitor. Cleansed and asssted pt back to bed.  0300 Pt getting more and more agitated and wanted to call wife. Keeps on saying, "Something is not right, they made a fool out of me. I can't believe how people could be so nice to someone they don't know."  Tried to reach out to wife but calls goes to voice mail. Paged MD for anxiety/agitation medication. Received order for haldol 2mg  IV, wasn't able to give since pt removed his IV and declined for another one, not amenable for discussion/teaching. Paged MD again for medication in PO  0356 Finally we were able to reach wife by phone. Pt spoke to wife and calmed down a little but still confused. This RN also spoke to wife regarding pt's condition and wife would want to see the pt in the morning but stated concern for her condition and her transportation cause she cant drive. Wife will call back again in the morning for updates regarding pt and would also like to speak to MD.    0431 Pt situated in bed, Given xanax 0.25 mg per MD, will continue to monitor.

## 2018-08-04 NOTE — Progress Notes (Signed)
PIV consult: Pt declined IV. Not amenable to teaching/ discussion at this time.

## 2018-08-04 NOTE — Progress Notes (Signed)
Extensive update given to [name] at [phone] about pt condition and discharge to the [place] at [facility].  Pt will go with a foley catheter in place, leg bag and DC instructions.

## 2018-08-04 NOTE — Progress Notes (Signed)
Physical Therapy Treatment Patient Details Name: [name] MRN: [id number] DOB: October 02, 1923 Today's Date: 08/04/2018    History of Present Illness Pt is a 85 y/o male admitted secondary to chest pain vs abdominal pain. Workup pending. Imaging negative for PE. PMH includes HTN, CAD, and mild cognitive deficits.     PT Comments    Pt pleasant and agreeable to PT session this am.  Pt reports multiple times he lives at [facility] during session.   He required reorientation on what hopsital he was in.  Pt continues to progress well to return home.  No LOB during this session and only minor safety concerns when negotiating obstacles in halls.     Follow Up Recommendations  Home health PT;Supervision/Assistance - 24 hour     Equipment Recommendations  None recommended by PT    Recommendations for Other Services       Precautions / Restrictions Precautions Precautions: Fall Restrictions Weight Bearing Restrictions: No    Mobility  Bed Mobility           Sit to supine: Supervision   General bed mobility comments: Pt seated in recliner on arrival.  Transfers Overall transfer level: Needs assistance Equipment used: Rolling walker (2 wheeled) Transfers: Sit to/from Stand Sit to Stand: Min guard         General transfer comment: Min guard for steadying assist to stand with RW.   Ambulation/Gait Ambulation/Gait assistance: Min guard Gait Distance (Feet): 150 Feet Assistive device: Rolling walker (2 wheeled) Gait Pattern/deviations: Step-through pattern;Decreased stride length;Trunk flexed Gait velocity: Decreased    General Gait Details: No LOB noted.  Pt with minor safety concerns in regards to negotiating obstacles in his pathway.  Cues for upper trunk control and RW safety.   Stairs             Wheelchair Mobility    Modified Rankin (Stroke Patients Only)       Balance Overall balance assessment: Needs assistance Sitting-balance support: No upper  extremity supported;Feet supported Sitting balance-Leahy Scale: Good       Standing balance-Leahy Scale: Poor                              Cognition Arousal/Alertness: Awake/alert Behavior During Therapy: WFL for tasks assessed/performed Overall Cognitive Status: History of cognitive impairments - at baseline                                 General Comments: Per wife, pt with mild cognitive deficits. Noted memory deficits during session.  Re-oriented patient to place.      Exercises      General Comments        Pertinent Vitals/Pain Pain Assessment: No/denies pain    Home Living                      Prior Function            PT Goals (current goals can now be found in the care plan section) Acute Rehab PT Goals Patient Stated Goal: for pt to return home Potential to Achieve Goals: Good Progress towards PT goals: Progressing toward goals    Frequency    Min 3X/week      PT Plan Current plan remains appropriate    Co-evaluation              AM-PAC PT "  6 Clicks" Mobility   Outcome Measure  Help needed turning from your back to your side while in a flat bed without using bedrails?: A Little Help needed moving from lying on your back to sitting on the side of a flat bed without using bedrails?: A Little Help needed moving to and from a bed to a chair (including a wheelchair)?: A Little Help needed standing up from a chair using your arms (e.g., wheelchair or bedside chair)?: A Little Help needed to walk in hospital room?: A Little Help needed climbing 3-5 steps with a railing? : A Little 6 Click Score: 18    End of Session Equipment Utilized During Treatment: Gait belt Activity Tolerance: Patient tolerated treatment well Patient left: in chair;with call bell/phone within reach;with chair alarm set Nurse Communication: Mobility status PT Visit Diagnosis: Unsteadiness on feet (R26.81);Muscle weakness (generalized)  (M62.81)     Time: 1388-7195 PT Time Calculation (min) (ACUTE ONLY): 15 min  Charges:  $Gait Training: 8-22 mins                     Governor Rooks, PTA Acute Rehabilitation Services Pager [phone] Office [phone]     Lalonnie Shaffer Eli Hose 08/04/2018, 11:15 AM

## 2018-08-04 NOTE — NC FL2 (Signed)
[place] LEVEL OF CARE SCREENING TOOL     IDENTIFICATION  Patient Name: [name] Birthdate: [birth year] Sex: male Admission Date (Current Location): 08/02/2018  [facility] [place] Number:  Herbalist and Address:  [facility], [place] [address] [place], [place] [address]      Provider Number: [id number]  Attending Physician Name and Address:  Nita Sells, MD  Relative Name and Phone Number:  Yonis Carreon, wife, [phone]    Current Level of Care: Hospital Recommended Level of Care: [place] Prior Approval Number:    Date Approved/Denied:   PASRR Number: [id number]  Discharge Plan: SNF    Current Diagnoses: Patient Active Problem List   Diagnosis Date Noted  . Abdominal pain, acute, epigastric 08/02/2018  . Hypertensive urgency 08/02/2018  . Hyperbilirubinemia 08/02/2018  . Hypertension   . Abdominal pain 07/13/2018  . Bradycardia 07/13/2018  . Orthostatic dizziness 12/15/2017  . Wrist pain, acute, right 10/14/2017  . Seborrheic keratoses 06/01/2017  . Chest pain 04/22/2017  . Fall 04/22/2017  . Gait disorder 11/03/2016  . Nausea 10/12/2016  . Hearing loss 11/25/2015  . Wart viral 04/28/2015  . 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 Situational depression 08/07/2013  . Well adult exam 08/02/2012  . Nausea with vomiting 03/28/2012  . HYPERGLYCEMIA 02/23/2010  . HYPERKALEMIA 10/21/2009  . Actinic keratosis 10/21/2009  . 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 PSA, INCREASED 04/22/2009  . MELANOMA, SCALP 01/02/2009  . NEOPLASM, SKIN, UNCERTAIN BEHAVIOR 37/16/9678  . CAROTID ARTERY STENOSIS, WITHOUT INFARCTION 05/06/2008  . Congestive heart failure ([place]) 04/29/2008  . Chronic combined systolic and diastolic CHF (congestive heart failure) ([place]) 04/29/2008  . Osteoarthritis 04/29/2008  . VERTIGO 03/11/2008  . Dyslipidemia 04/14/2007  . Essential hypertension  04/14/2007  . Coronary atherosclerosis 04/14/2007  . BPH (benign prostatic hyperplasia) 04/14/2007    Orientation RESPIRATION BLADDER Height & Weight     Self, Time, Situation, Place  Normal Continent, Indwelling catheter(foley for retention) Weight: 139 lb 8.8 oz (63.3 kg) Height:  5\' 9"  (175.3 cm)  BEHAVIORAL SYMPTOMS/MOOD NEUROLOGICAL BOWEL NUTRITION STATUS      Continent Diet(soft diet; thin liquids)  AMBULATORY STATUS COMMUNICATION OF NEEDS Skin   Limited Assist Verbally Normal, Other (Comment)(dry patches)                       Personal Care Assistance Level of Assistance  Feeding, Dressing, Bathing Bathing Assistance: Limited assistance Feeding assistance: Independent Dressing Assistance: Limited assistance     Functional Limitations Info  Sight, Hearing, Speech Sight Info: Adequate Hearing Info: Adequate Speech Info: Adequate    SPECIAL CARE FACTORS FREQUENCY  OT (By licensed OT), PT (By licensed PT)     PT Frequency: 5x week OT Frequency: 5x week            Contractures Contractures Info: Not present    Additional Factors Info  Code Status, Allergies Code Status Info: Full Code Allergies Info: Oxycodone, Penicillins           Current Medications (08/04/2018):  This is the current hospital active medication list Current Facility-Administered Medications  Medication Dose Route Frequency Provider Last Rate Last Dose  . acetaminophen (TYLENOL) tablet 650 mg  650 mg Oral Q6H PRN Opyd, Ilene Qua, MD       Or  . acetaminophen (TYLENOL) suppository 650 mg  650 mg Rectal Q6H PRN Opyd, Ilene Qua, MD      . aspirin  EC tablet 81 mg  81 mg Oral Daily Nita Sells, MD   81 mg at 08/04/18 1144  . atorvastatin (LIPITOR) tablet 40 mg  40 mg Oral q1800 Nita Sells, MD   40 mg at 08/03/18 1742  . finasteride (PROSCAR) tablet 5 mg  5 mg Oral Daily Nita Sells, MD   5 mg at 08/04/18 1144  . heparin injection 5,000 Units  5,000 Units  Subcutaneous Q8H Vianne Bulls, MD   5,000 Units at 08/03/18 2118  . hydrALAZINE (APRESOLINE) injection 10 mg  10 mg Intravenous Q4H PRN Opyd, Ilene Qua, MD   10 mg at 08/03/18 1742  . ondansetron (ZOFRAN) tablet 4 mg  4 mg Oral Q6H PRN Opyd, Ilene Qua, MD       Or  . ondansetron (ZOFRAN) injection 4 mg  4 mg Intravenous Q6H PRN Opyd, Ilene Qua, MD      . pantoprazole (PROTONIX) EC tablet 40 mg  40 mg Oral Daily Nita Sells, MD   40 mg at 08/04/18 1144  . polyethylene glycol (MIRALAX / GLYCOLAX) packet 17 g  17 g Oral Daily Nita Sells, MD   17 g at 08/04/18 1144  . sodium chloride flush (NS) 0.9 % injection 3 mL  3 mL Intravenous Once Opyd, Timothy S, MD      . sodium chloride flush (NS) 0.9 % injection 3 mL  3 mL Intravenous Q12H Opyd, Ilene Qua, MD      . tamsulosin (FLOMAX) capsule 0.4 mg  0.4 mg Oral QPC breakfast Nita Sells, MD   0.4 mg at 08/04/18 1610     Discharge Medications: Please see discharge summary for a list of discharge medications.  Relevant Imaging Results:  Relevant Lab Results:   Additional Information [id number] [place], [place]

## 2018-08-04 NOTE — Progress Notes (Signed)
Report given to [facility] at [facility]. VS:  Ax Temp=98.1,63, O2sat=99%, 114/61.

## 2018-08-04 NOTE — Progress Notes (Signed)
Just talked to pt's wife. She's upset about pt's current situation and wanted husband to be home. Stated she wants updates regarding care.   Horatius.Blunt MD in room, this RN notified about pt's situation. MD will update wife.

## 2018-08-05 DIAGNOSIS — R41841 Cognitive communication deficit: Secondary | ICD-10-CM | POA: Diagnosis not present

## 2018-08-05 DIAGNOSIS — I2510 Atherosclerotic heart disease of native coronary artery without angina pectoris: Secondary | ICD-10-CM | POA: Diagnosis not present

## 2018-08-05 DIAGNOSIS — R262 Difficulty in walking, not elsewhere classified: Secondary | ICD-10-CM | POA: Diagnosis not present

## 2018-08-05 DIAGNOSIS — M6281 Muscle weakness (generalized): Secondary | ICD-10-CM | POA: Diagnosis not present

## 2018-08-05 DIAGNOSIS — N401 Enlarged prostate with lower urinary tract symptoms: Secondary | ICD-10-CM | POA: Diagnosis not present

## 2018-08-07 ENCOUNTER — Telehealth: Payer: Self-pay | Admitting: *Deleted

## 2018-08-07 DIAGNOSIS — R41841 Cognitive communication deficit: Secondary | ICD-10-CM | POA: Diagnosis not present

## 2018-08-07 DIAGNOSIS — N401 Enlarged prostate with lower urinary tract symptoms: Secondary | ICD-10-CM | POA: Diagnosis not present

## 2018-08-07 DIAGNOSIS — I2510 Atherosclerotic heart disease of native coronary artery without angina pectoris: Secondary | ICD-10-CM | POA: Diagnosis not present

## 2018-08-07 DIAGNOSIS — R262 Difficulty in walking, not elsewhere classified: Secondary | ICD-10-CM | POA: Diagnosis not present

## 2018-08-07 DIAGNOSIS — M6281 Muscle weakness (generalized): Secondary | ICD-10-CM | POA: Diagnosis not present

## 2018-08-07 NOTE — Telephone Encounter (Signed)
Pt was on the patient ping portal was admitted for observation 08/02/18 for Abdominal pain, acute, epigastric. Do not think he has angina-troponins negative EKG shows bradycardia without damage pattern Hold repeat echo Continue ASA 81. Pt was D/C to SNF 08/04/18 per summary will ned to f/u w/MD in 1 week.Marland KitchenJohny Riddle

## 2018-08-08 DIAGNOSIS — N401 Enlarged prostate with lower urinary tract symptoms: Secondary | ICD-10-CM | POA: Diagnosis not present

## 2018-08-08 DIAGNOSIS — M6281 Muscle weakness (generalized): Secondary | ICD-10-CM | POA: Diagnosis not present

## 2018-08-08 DIAGNOSIS — R262 Difficulty in walking, not elsewhere classified: Secondary | ICD-10-CM | POA: Diagnosis not present

## 2018-08-08 DIAGNOSIS — R41841 Cognitive communication deficit: Secondary | ICD-10-CM | POA: Diagnosis not present

## 2018-08-08 DIAGNOSIS — I2510 Atherosclerotic heart disease of native coronary artery without angina pectoris: Secondary | ICD-10-CM | POA: Diagnosis not present

## 2018-08-09 DIAGNOSIS — I5042 Chronic combined systolic (congestive) and diastolic (congestive) heart failure: Secondary | ICD-10-CM | POA: Diagnosis not present

## 2018-08-09 DIAGNOSIS — F419 Anxiety disorder, unspecified: Secondary | ICD-10-CM | POA: Diagnosis not present

## 2018-08-09 DIAGNOSIS — N401 Enlarged prostate with lower urinary tract symptoms: Secondary | ICD-10-CM | POA: Diagnosis not present

## 2018-08-09 DIAGNOSIS — R262 Difficulty in walking, not elsewhere classified: Secondary | ICD-10-CM | POA: Diagnosis not present

## 2018-08-09 DIAGNOSIS — R41841 Cognitive communication deficit: Secondary | ICD-10-CM | POA: Diagnosis not present

## 2018-08-09 DIAGNOSIS — K219 Gastro-esophageal reflux disease without esophagitis: Secondary | ICD-10-CM | POA: Diagnosis not present

## 2018-08-09 DIAGNOSIS — N400 Enlarged prostate without lower urinary tract symptoms: Secondary | ICD-10-CM | POA: Diagnosis not present

## 2018-08-09 DIAGNOSIS — I2510 Atherosclerotic heart disease of native coronary artery without angina pectoris: Secondary | ICD-10-CM | POA: Diagnosis not present

## 2018-08-09 DIAGNOSIS — R339 Retention of urine, unspecified: Secondary | ICD-10-CM | POA: Diagnosis not present

## 2018-08-09 DIAGNOSIS — M6281 Muscle weakness (generalized): Secondary | ICD-10-CM | POA: Diagnosis not present

## 2018-08-09 DIAGNOSIS — E559 Vitamin D deficiency, unspecified: Secondary | ICD-10-CM | POA: Diagnosis not present

## 2018-08-09 DIAGNOSIS — R1084 Generalized abdominal pain: Secondary | ICD-10-CM | POA: Diagnosis not present

## 2018-08-09 DIAGNOSIS — K5900 Constipation, unspecified: Secondary | ICD-10-CM | POA: Diagnosis not present

## 2018-08-10 DIAGNOSIS — Z79899 Other long term (current) drug therapy: Secondary | ICD-10-CM | POA: Diagnosis not present

## 2018-08-10 DIAGNOSIS — N390 Urinary tract infection, site not specified: Secondary | ICD-10-CM | POA: Diagnosis not present

## 2018-08-10 DIAGNOSIS — I2510 Atherosclerotic heart disease of native coronary artery without angina pectoris: Secondary | ICD-10-CM | POA: Diagnosis not present

## 2018-08-10 DIAGNOSIS — R262 Difficulty in walking, not elsewhere classified: Secondary | ICD-10-CM | POA: Diagnosis not present

## 2018-08-10 DIAGNOSIS — R41841 Cognitive communication deficit: Secondary | ICD-10-CM | POA: Diagnosis not present

## 2018-08-10 DIAGNOSIS — M6281 Muscle weakness (generalized): Secondary | ICD-10-CM | POA: Diagnosis not present

## 2018-08-10 DIAGNOSIS — M545 Low back pain: Secondary | ICD-10-CM | POA: Diagnosis not present

## 2018-08-10 DIAGNOSIS — N401 Enlarged prostate with lower urinary tract symptoms: Secondary | ICD-10-CM | POA: Diagnosis not present

## 2018-08-10 DIAGNOSIS — R319 Hematuria, unspecified: Secondary | ICD-10-CM | POA: Diagnosis not present

## 2018-08-11 DIAGNOSIS — I2510 Atherosclerotic heart disease of native coronary artery without angina pectoris: Secondary | ICD-10-CM | POA: Diagnosis not present

## 2018-08-11 DIAGNOSIS — R262 Difficulty in walking, not elsewhere classified: Secondary | ICD-10-CM | POA: Diagnosis not present

## 2018-08-11 DIAGNOSIS — M6281 Muscle weakness (generalized): Secondary | ICD-10-CM | POA: Diagnosis not present

## 2018-08-11 DIAGNOSIS — R41841 Cognitive communication deficit: Secondary | ICD-10-CM | POA: Diagnosis not present

## 2018-08-11 DIAGNOSIS — N401 Enlarged prostate with lower urinary tract symptoms: Secondary | ICD-10-CM | POA: Diagnosis not present

## 2018-08-14 DIAGNOSIS — I10 Essential (primary) hypertension: Secondary | ICD-10-CM | POA: Diagnosis not present

## 2018-08-14 DIAGNOSIS — R41841 Cognitive communication deficit: Secondary | ICD-10-CM | POA: Diagnosis not present

## 2018-08-14 DIAGNOSIS — N401 Enlarged prostate with lower urinary tract symptoms: Secondary | ICD-10-CM | POA: Diagnosis not present

## 2018-08-14 DIAGNOSIS — F064 Anxiety disorder due to known physiological condition: Secondary | ICD-10-CM | POA: Diagnosis not present

## 2018-08-14 DIAGNOSIS — M6281 Muscle weakness (generalized): Secondary | ICD-10-CM | POA: Diagnosis not present

## 2018-08-15 DIAGNOSIS — K5900 Constipation, unspecified: Secondary | ICD-10-CM | POA: Diagnosis not present

## 2018-08-15 DIAGNOSIS — N401 Enlarged prostate with lower urinary tract symptoms: Secondary | ICD-10-CM | POA: Diagnosis not present

## 2018-08-15 DIAGNOSIS — R338 Other retention of urine: Secondary | ICD-10-CM | POA: Diagnosis not present

## 2018-08-15 DIAGNOSIS — F0390 Unspecified dementia without behavioral disturbance: Secondary | ICD-10-CM | POA: Diagnosis not present

## 2018-08-15 DIAGNOSIS — M6281 Muscle weakness (generalized): Secondary | ICD-10-CM | POA: Diagnosis not present

## 2018-08-15 DIAGNOSIS — K219 Gastro-esophageal reflux disease without esophagitis: Secondary | ICD-10-CM | POA: Diagnosis not present

## 2018-08-15 DIAGNOSIS — F064 Anxiety disorder due to known physiological condition: Secondary | ICD-10-CM | POA: Diagnosis not present

## 2018-08-15 DIAGNOSIS — R41841 Cognitive communication deficit: Secondary | ICD-10-CM | POA: Diagnosis not present

## 2018-08-15 DIAGNOSIS — E559 Vitamin D deficiency, unspecified: Secondary | ICD-10-CM | POA: Diagnosis not present

## 2018-08-15 DIAGNOSIS — I2510 Atherosclerotic heart disease of native coronary artery without angina pectoris: Secondary | ICD-10-CM | POA: Diagnosis not present

## 2018-08-15 DIAGNOSIS — F419 Anxiety disorder, unspecified: Secondary | ICD-10-CM | POA: Diagnosis not present

## 2018-08-15 DIAGNOSIS — I10 Essential (primary) hypertension: Secondary | ICD-10-CM | POA: Diagnosis not present

## 2018-08-16 DIAGNOSIS — R41841 Cognitive communication deficit: Secondary | ICD-10-CM | POA: Diagnosis not present

## 2018-08-16 DIAGNOSIS — F419 Anxiety disorder, unspecified: Secondary | ICD-10-CM | POA: Diagnosis not present

## 2018-08-16 DIAGNOSIS — I10 Essential (primary) hypertension: Secondary | ICD-10-CM | POA: Diagnosis not present

## 2018-08-16 DIAGNOSIS — G4700 Insomnia, unspecified: Secondary | ICD-10-CM | POA: Diagnosis not present

## 2018-08-16 DIAGNOSIS — M6281 Muscle weakness (generalized): Secondary | ICD-10-CM | POA: Diagnosis not present

## 2018-08-16 DIAGNOSIS — F064 Anxiety disorder due to known physiological condition: Secondary | ICD-10-CM | POA: Diagnosis not present

## 2018-08-16 DIAGNOSIS — N401 Enlarged prostate with lower urinary tract symptoms: Secondary | ICD-10-CM | POA: Diagnosis not present

## 2018-08-16 DIAGNOSIS — F0390 Unspecified dementia without behavioral disturbance: Secondary | ICD-10-CM | POA: Diagnosis not present

## 2018-08-17 DIAGNOSIS — F064 Anxiety disorder due to known physiological condition: Secondary | ICD-10-CM | POA: Diagnosis not present

## 2018-08-17 DIAGNOSIS — R41841 Cognitive communication deficit: Secondary | ICD-10-CM | POA: Diagnosis not present

## 2018-08-17 DIAGNOSIS — M6281 Muscle weakness (generalized): Secondary | ICD-10-CM | POA: Diagnosis not present

## 2018-08-17 DIAGNOSIS — I10 Essential (primary) hypertension: Secondary | ICD-10-CM | POA: Diagnosis not present

## 2018-08-17 DIAGNOSIS — N401 Enlarged prostate with lower urinary tract symptoms: Secondary | ICD-10-CM | POA: Diagnosis not present

## 2018-08-18 DIAGNOSIS — R41841 Cognitive communication deficit: Secondary | ICD-10-CM | POA: Diagnosis not present

## 2018-08-18 DIAGNOSIS — M6281 Muscle weakness (generalized): Secondary | ICD-10-CM | POA: Diagnosis not present

## 2018-08-18 DIAGNOSIS — N401 Enlarged prostate with lower urinary tract symptoms: Secondary | ICD-10-CM | POA: Diagnosis not present

## 2018-08-18 DIAGNOSIS — F064 Anxiety disorder due to known physiological condition: Secondary | ICD-10-CM | POA: Diagnosis not present

## 2018-08-18 DIAGNOSIS — I10 Essential (primary) hypertension: Secondary | ICD-10-CM | POA: Diagnosis not present

## 2018-08-21 DIAGNOSIS — N401 Enlarged prostate with lower urinary tract symptoms: Secondary | ICD-10-CM | POA: Diagnosis not present

## 2018-08-21 DIAGNOSIS — I10 Essential (primary) hypertension: Secondary | ICD-10-CM | POA: Diagnosis not present

## 2018-08-21 DIAGNOSIS — M6281 Muscle weakness (generalized): Secondary | ICD-10-CM | POA: Diagnosis not present

## 2018-08-21 DIAGNOSIS — F064 Anxiety disorder due to known physiological condition: Secondary | ICD-10-CM | POA: Diagnosis not present

## 2018-08-21 DIAGNOSIS — R41841 Cognitive communication deficit: Secondary | ICD-10-CM | POA: Diagnosis not present

## 2018-08-21 NOTE — Progress Notes (Deleted)
{Choose 1 Note Type (Telehealth Visit or Telephone Visit):[phone]}   Date:  08/21/2018   ID:  [name], DOB Oct 07, 1923, MRN [id number]  {Patient Location:[id number]::"Home"} {Provider Location:[phone]::"Home"}  PCP:  Plotnikov, Evie Lacks, MD  Cardiologist:  Lauree Chandler, MD *** Electrophysiologist:  None   Evaluation Performed:  {Choose Visit Type:[phone]::"Follow-Up Visit"}  Chief Complaint:  ***  History of Present Illness:    [name] is a 85 y.o. male  with CAD S/P inf MI 2002 treated with BMS Cfx. USAP 2007 with severe mid LAD stenosis treated with DES LAD. Chest pain 04/22/17 troponin negative, chest CTA without evidence of PE or aortic dissection. Echo 04/22/17 EF 45-50% mild LVH, mild AI & MR. Also has HTN.   Coreg decreased because of heart rates in the 40s.  I had a telemedicine visit with the patient 07/25/2018 at which time he was feeling much better and heart rates were 46-54 before taking his medication.  I stopped his Coreg completely and he was to have an EKG by his PCP the following day.  Patient was discharged from the hospital 08/04/2018 after admission with abdominal pain secondary to urinary retention.  EKG showed sinus bradycardia at 53 bpm, creatinine remaining main normal bilirubins were elevated  The patient {does/does not:200015} have symptoms concerning for COVID-19 infection (fever, chills, cough, or new shortness of breath).    Past Medical History:  Diagnosis Date  . Coronary atherosclerosis of unspecified type of vessel, native or graft   . Dizziness and giddiness   . Hypertrophy of prostate without urinary obstruction and other lower urinary tract symptoms (LUTS)   . MI (myocardial infarction) ([place]) 2002  . Osteoarthrosis, unspecified whether generalized or localized, unspecified site   . Other and unspecified hyperlipidemia   . Shortness of breath dyspnea    with exertion  . Skin cancer    "top of his head"  .  Thrombocytopenia, unspecified ([place])   . Unspecified essential hypertension    Past Surgical History:  Procedure Laterality Date  . COLONOSCOPY    . CORONARY ANGIOPLASTY  01/07/2001, 2008   2 stents  . ESOPHAGOGASTRODUODENOSCOPY N/A 04/22/2014   Procedure: ESOPHAGOGASTRODUODENOSCOPY (EGD);  Surgeon: Milus Banister, MD;  Location: [facility];  Service: Endoscopy;  Laterality: N/A;  . HIP ARTHROPLASTY Right 2006  . JOINT REPLACEMENT    . MOHS SURGERY     "top of his head"  . TRANSURETHRAL RESECTION OF PROSTATE       No outpatient medications have been marked as taking for the 08/22/18 encounter (Appointment) with Imogene Burn, PA-C.     Allergies:   Oxycodone and Penicillins   Social History   Tobacco Use  . Smoking status: Never Smoker  . Smokeless tobacco: Never Used  Substance Use Topics  . Alcohol use: No    Alcohol/week: 0.0 standard drinks  . Drug use: No     Family Hx: The patient's family history includes Coronary artery disease in an other family member; Heart disease in his father. There is no history of Colon cancer.  ROS:   Please see the history of present illness.    *** All other systems reviewed and are negative.   Prior CV studies:   The following studies were reviewed today:  Echo 04/22/17: - Left ventricle: The cavity size was normal. Wall thickness was   increased in a pattern of mild LVH. Systolic function was mildly   reduced. The estimated ejection fraction was in the range of 45%  to 50%. There is akinesis of the basal-midinferoseptal   myocardium. Doppler parameters are consistent with abnormal left   ventricular relaxation (grade 1 diastolic dysfunction). - Aortic valve: Trileaflet; mildly thickened, mildly calcified   leaflets. There was mild regurgitation. - Mitral valve: There was mild regurgitation. - Left atrium: The atrium was mildly dilated. - Pulmonary arteries: Systolic pressure was mildly increased. PA   peak pressure: 48 mm  Hg (S).        Labs/Other Tests and Data Reviewed:    EKG:  {EKG/Telemetry Strips Reviewed:[phone]}  Recent Labs: 12/15/2017: TSH 3.57 08/03/2018: ALT 19; BUN 17; Creatinine, Ser 1.25; Hemoglobin 13.9; Platelets 188; Potassium 4.5; Sodium 137   Recent Lipid Panel Lab Results  Component Value Date/Time   CHOL 159 04/22/2017 09:00 AM   TRIG 68 04/22/2017 09:00 AM   HDL 72 04/22/2017 09:00 AM   CHOLHDL 2.2 04/22/2017 09:00 AM   LDLCALC 73 04/22/2017 09:00 AM    Wt Readings from Last 3 Encounters:  08/04/18 139 lb 8.8 oz (63.3 kg)  07/26/18 146 lb (66.2 kg)  07/25/18 145 lb (65.8 kg)     Objective:    Vital Signs:  There were no vitals taken for this visit.   {HeartCare Virtual Exam (Optional):[phone]::"VITAL SIGNS:  reviewed"}  ASSESSMENT & PLAN:    1. CAD status post inferior MI in 2002 treated with BMS to the circumflex, DES to the LAD in 2007 2. Ischemic cardiomyopathy ejection fraction 45 to 50% on echo in 2019 mild LVH mild AI and MR 3. Essential hypertension 4. Bradycardia carvedilol stopped completely heart rate in the 50s  COVID-19 Education: The signs and symptoms of COVID-19 were discussed with the patient and how to seek care for testing (follow up with PCP or arrange E-visit).  ***The importance of social distancing was discussed today.  Time:   Today, I have spent *** minutes with the patient with telehealth technology discussing the above problems.     Medication Adjustments/Labs and Tests Ordered: Current medicines are reviewed at length with the patient today.  Concerns regarding medicines are outlined above.   Tests Ordered: No orders of the defined types were placed in this encounter.   Medication Changes: No orders of the defined types were placed in this encounter.   Follow Up:  {F/U Format:[phone]} {follow up:15908}  Signed, Ermalinda Barrios, PA-C  08/21/2018 3:40 PM    [facility]

## 2018-08-22 ENCOUNTER — Other Ambulatory Visit: Payer: Self-pay

## 2018-08-22 ENCOUNTER — Telehealth: Payer: Self-pay | Admitting: Internal Medicine

## 2018-08-22 ENCOUNTER — Telehealth: Payer: Medicare Other | Admitting: Physician Assistant

## 2018-08-22 DIAGNOSIS — M6281 Muscle weakness (generalized): Secondary | ICD-10-CM | POA: Diagnosis not present

## 2018-08-22 DIAGNOSIS — N401 Enlarged prostate with lower urinary tract symptoms: Secondary | ICD-10-CM | POA: Diagnosis not present

## 2018-08-22 DIAGNOSIS — F064 Anxiety disorder due to known physiological condition: Secondary | ICD-10-CM | POA: Diagnosis not present

## 2018-08-22 DIAGNOSIS — I10 Essential (primary) hypertension: Secondary | ICD-10-CM | POA: Diagnosis not present

## 2018-08-22 DIAGNOSIS — R41841 Cognitive communication deficit: Secondary | ICD-10-CM | POA: Diagnosis not present

## 2018-08-22 NOTE — Telephone Encounter (Signed)
Oran Rein, son-in-law calling.  States that they need to get information to Dr. Alain Marion regarding pt's care care at [facility].  Pt was hospitalized on 08/02/2018 for chest pains - pt was diagnosed with urinary blockage at that time.  Pt is going to Dr. Junious Silk, [facility] on Friday.  After being released from hospital pt went to rehab at [facility] and while there pt began to exhibit memory loss and some cognitive problems.  [facility] has diagnosed pt dementia and he is in a memory care unit there now.  Family is concerned of sudden onset of symptoms.  Dr. Alain Marion saw pt on 07/26/2018 - wonders if PCP saw any cognition issues.   Family wonders if pt should be seen by neurologist or psychologist and they would like to know if PCP is in agreement with that and if so could PCP recommend and schedule something for pt.  Family wonders if there is a way that they can have access to the pt's MyChart for communication with provider at this time.  Because of COVID no one is able to see patient and they are also concerned with this - pt has not been seen wife or family since 08/02/2018 so they are unable to verify that diagnosis given by [facility] is accurate.  Pt's wife, [name], can't hear well and doesn't understand what is going on.  They would like for information to go through family spokesperson John at this time.  Jenny Reichmann can be reached at [phone].

## 2018-08-22 NOTE — Telephone Encounter (Signed)
Thank you for an update! I spoke with Jeovany Huitron on the phone twice on Monday. I observed Ralf having more problems with progressive aging and slowing down over the past year.  Sudden decline may be provoked by a physical illness, change of surroundings etc.  It is an unfortunate situation with COVID-19 isolation.  It makes all matters worse. You need to contact [facility] to discuss consultations with neurologist or psychologist. Thanks

## 2018-08-23 ENCOUNTER — Telehealth: Payer: Self-pay | Admitting: Cardiovascular Disease

## 2018-08-23 NOTE — Telephone Encounter (Signed)
New Message           Son in law is calling to let Dr. Angelena Form know the patient was released from the hospital on 08/04/2018. After been released patient development confusion and memory loss. Would like to know what type of recommendation do he have. Pls advsie.

## 2018-08-23 NOTE — Telephone Encounter (Signed)
Patient's daughter, [name] informed of below.

## 2018-08-24 DIAGNOSIS — F064 Anxiety disorder due to known physiological condition: Secondary | ICD-10-CM | POA: Diagnosis not present

## 2018-08-24 DIAGNOSIS — R41841 Cognitive communication deficit: Secondary | ICD-10-CM | POA: Diagnosis not present

## 2018-08-24 DIAGNOSIS — M6281 Muscle weakness (generalized): Secondary | ICD-10-CM | POA: Diagnosis not present

## 2018-08-24 DIAGNOSIS — I10 Essential (primary) hypertension: Secondary | ICD-10-CM | POA: Diagnosis not present

## 2018-08-24 DIAGNOSIS — N401 Enlarged prostate with lower urinary tract symptoms: Secondary | ICD-10-CM | POA: Diagnosis not present

## 2018-08-24 NOTE — Telephone Encounter (Signed)
Pat, Can you touch base with them? I don't have anything to add to what Dr. Alain Marion said about his memory issues. Gerald Stabs

## 2018-08-24 NOTE — Telephone Encounter (Signed)
Left message to call office

## 2018-08-24 NOTE — Telephone Encounter (Signed)
I spoke with pt's son in law and gave him information from Dr. Angelena Form. Pt has been diagnosed with dementia. Family would like to set up my chart account.  Will ask medical records to assist with this.

## 2018-08-25 DIAGNOSIS — I10 Essential (primary) hypertension: Secondary | ICD-10-CM | POA: Diagnosis not present

## 2018-08-25 DIAGNOSIS — R338 Other retention of urine: Secondary | ICD-10-CM | POA: Diagnosis not present

## 2018-08-25 DIAGNOSIS — F064 Anxiety disorder due to known physiological condition: Secondary | ICD-10-CM | POA: Diagnosis not present

## 2018-08-25 DIAGNOSIS — N401 Enlarged prostate with lower urinary tract symptoms: Secondary | ICD-10-CM | POA: Diagnosis not present

## 2018-08-25 DIAGNOSIS — M6281 Muscle weakness (generalized): Secondary | ICD-10-CM | POA: Diagnosis not present

## 2018-08-25 DIAGNOSIS — R3912 Poor urinary stream: Secondary | ICD-10-CM | POA: Diagnosis not present

## 2018-08-25 DIAGNOSIS — R41841 Cognitive communication deficit: Secondary | ICD-10-CM | POA: Diagnosis not present

## 2018-08-28 DIAGNOSIS — R41841 Cognitive communication deficit: Secondary | ICD-10-CM | POA: Diagnosis not present

## 2018-08-28 DIAGNOSIS — F064 Anxiety disorder due to known physiological condition: Secondary | ICD-10-CM | POA: Diagnosis not present

## 2018-08-28 DIAGNOSIS — N401 Enlarged prostate with lower urinary tract symptoms: Secondary | ICD-10-CM | POA: Diagnosis not present

## 2018-08-28 DIAGNOSIS — I10 Essential (primary) hypertension: Secondary | ICD-10-CM | POA: Diagnosis not present

## 2018-08-28 DIAGNOSIS — M6281 Muscle weakness (generalized): Secondary | ICD-10-CM | POA: Diagnosis not present

## 2018-08-29 DIAGNOSIS — F0390 Unspecified dementia without behavioral disturbance: Secondary | ICD-10-CM | POA: Diagnosis not present

## 2018-08-29 DIAGNOSIS — M150 Primary generalized (osteo)arthritis: Secondary | ICD-10-CM | POA: Diagnosis not present

## 2018-08-29 DIAGNOSIS — K219 Gastro-esophageal reflux disease without esophagitis: Secondary | ICD-10-CM | POA: Diagnosis not present

## 2018-08-29 DIAGNOSIS — F064 Anxiety disorder due to known physiological condition: Secondary | ICD-10-CM | POA: Diagnosis not present

## 2018-08-29 DIAGNOSIS — I2510 Atherosclerotic heart disease of native coronary artery without angina pectoris: Secondary | ICD-10-CM | POA: Diagnosis not present

## 2018-08-29 DIAGNOSIS — M6281 Muscle weakness (generalized): Secondary | ICD-10-CM | POA: Diagnosis not present

## 2018-08-29 DIAGNOSIS — E559 Vitamin D deficiency, unspecified: Secondary | ICD-10-CM | POA: Diagnosis not present

## 2018-08-29 DIAGNOSIS — R41841 Cognitive communication deficit: Secondary | ICD-10-CM | POA: Diagnosis not present

## 2018-08-29 DIAGNOSIS — R54 Age-related physical debility: Secondary | ICD-10-CM | POA: Diagnosis not present

## 2018-08-29 DIAGNOSIS — I10 Essential (primary) hypertension: Secondary | ICD-10-CM | POA: Diagnosis not present

## 2018-08-29 DIAGNOSIS — K5900 Constipation, unspecified: Secondary | ICD-10-CM | POA: Diagnosis not present

## 2018-08-29 DIAGNOSIS — F419 Anxiety disorder, unspecified: Secondary | ICD-10-CM | POA: Diagnosis not present

## 2018-08-29 DIAGNOSIS — N401 Enlarged prostate with lower urinary tract symptoms: Secondary | ICD-10-CM | POA: Diagnosis not present

## 2018-08-29 DIAGNOSIS — G4700 Insomnia, unspecified: Secondary | ICD-10-CM | POA: Diagnosis not present

## 2018-08-30 DIAGNOSIS — R41841 Cognitive communication deficit: Secondary | ICD-10-CM | POA: Diagnosis not present

## 2018-08-30 DIAGNOSIS — R2689 Other abnormalities of gait and mobility: Secondary | ICD-10-CM | POA: Diagnosis not present

## 2018-08-30 DIAGNOSIS — R4182 Altered mental status, unspecified: Secondary | ICD-10-CM | POA: Diagnosis not present

## 2018-08-30 DIAGNOSIS — M6281 Muscle weakness (generalized): Secondary | ICD-10-CM | POA: Diagnosis not present

## 2018-08-30 DIAGNOSIS — R339 Retention of urine, unspecified: Secondary | ICD-10-CM | POA: Diagnosis not present

## 2018-08-30 DIAGNOSIS — R2681 Unsteadiness on feet: Secondary | ICD-10-CM | POA: Diagnosis not present

## 2018-08-30 DIAGNOSIS — I2510 Atherosclerotic heart disease of native coronary artery without angina pectoris: Secondary | ICD-10-CM | POA: Diagnosis not present

## 2018-09-05 DIAGNOSIS — R339 Retention of urine, unspecified: Secondary | ICD-10-CM | POA: Diagnosis not present

## 2018-09-05 DIAGNOSIS — M6281 Muscle weakness (generalized): Secondary | ICD-10-CM | POA: Diagnosis not present

## 2018-09-05 DIAGNOSIS — R41841 Cognitive communication deficit: Secondary | ICD-10-CM | POA: Diagnosis not present

## 2018-09-05 DIAGNOSIS — R2681 Unsteadiness on feet: Secondary | ICD-10-CM | POA: Diagnosis not present

## 2018-09-05 DIAGNOSIS — R4182 Altered mental status, unspecified: Secondary | ICD-10-CM | POA: Diagnosis not present

## 2018-09-05 DIAGNOSIS — I2510 Atherosclerotic heart disease of native coronary artery without angina pectoris: Secondary | ICD-10-CM | POA: Diagnosis not present

## 2018-09-05 DIAGNOSIS — R2689 Other abnormalities of gait and mobility: Secondary | ICD-10-CM | POA: Diagnosis not present

## 2018-09-07 DIAGNOSIS — M6281 Muscle weakness (generalized): Secondary | ICD-10-CM | POA: Diagnosis not present

## 2018-09-07 DIAGNOSIS — R2681 Unsteadiness on feet: Secondary | ICD-10-CM | POA: Diagnosis not present

## 2018-09-07 DIAGNOSIS — I2510 Atherosclerotic heart disease of native coronary artery without angina pectoris: Secondary | ICD-10-CM | POA: Diagnosis not present

## 2018-09-07 DIAGNOSIS — R4182 Altered mental status, unspecified: Secondary | ICD-10-CM | POA: Diagnosis not present

## 2018-09-07 DIAGNOSIS — R339 Retention of urine, unspecified: Secondary | ICD-10-CM | POA: Diagnosis not present

## 2018-09-07 DIAGNOSIS — R2689 Other abnormalities of gait and mobility: Secondary | ICD-10-CM | POA: Diagnosis not present

## 2018-09-08 DIAGNOSIS — R338 Other retention of urine: Secondary | ICD-10-CM | POA: Diagnosis not present

## 2018-09-08 DIAGNOSIS — R3912 Poor urinary stream: Secondary | ICD-10-CM | POA: Diagnosis not present

## 2018-09-12 DIAGNOSIS — R4182 Altered mental status, unspecified: Secondary | ICD-10-CM | POA: Diagnosis not present

## 2018-09-12 DIAGNOSIS — M6281 Muscle weakness (generalized): Secondary | ICD-10-CM | POA: Diagnosis not present

## 2018-09-12 DIAGNOSIS — R339 Retention of urine, unspecified: Secondary | ICD-10-CM | POA: Diagnosis not present

## 2018-09-12 DIAGNOSIS — I2510 Atherosclerotic heart disease of native coronary artery without angina pectoris: Secondary | ICD-10-CM | POA: Diagnosis not present

## 2018-09-12 DIAGNOSIS — R2681 Unsteadiness on feet: Secondary | ICD-10-CM | POA: Diagnosis not present

## 2018-09-12 DIAGNOSIS — R2689 Other abnormalities of gait and mobility: Secondary | ICD-10-CM | POA: Diagnosis not present

## 2018-09-14 DIAGNOSIS — R2681 Unsteadiness on feet: Secondary | ICD-10-CM | POA: Diagnosis not present

## 2018-09-14 DIAGNOSIS — M6281 Muscle weakness (generalized): Secondary | ICD-10-CM | POA: Diagnosis not present

## 2018-09-14 DIAGNOSIS — R2689 Other abnormalities of gait and mobility: Secondary | ICD-10-CM | POA: Diagnosis not present

## 2018-09-14 DIAGNOSIS — R4182 Altered mental status, unspecified: Secondary | ICD-10-CM | POA: Diagnosis not present

## 2018-09-14 DIAGNOSIS — I2510 Atherosclerotic heart disease of native coronary artery without angina pectoris: Secondary | ICD-10-CM | POA: Diagnosis not present

## 2018-09-14 DIAGNOSIS — R339 Retention of urine, unspecified: Secondary | ICD-10-CM | POA: Diagnosis not present

## 2018-09-18 DIAGNOSIS — I2510 Atherosclerotic heart disease of native coronary artery without angina pectoris: Secondary | ICD-10-CM | POA: Diagnosis not present

## 2018-09-18 DIAGNOSIS — R2681 Unsteadiness on feet: Secondary | ICD-10-CM | POA: Diagnosis not present

## 2018-09-18 DIAGNOSIS — R4182 Altered mental status, unspecified: Secondary | ICD-10-CM | POA: Diagnosis not present

## 2018-09-18 DIAGNOSIS — R339 Retention of urine, unspecified: Secondary | ICD-10-CM | POA: Diagnosis not present

## 2018-09-18 DIAGNOSIS — R2689 Other abnormalities of gait and mobility: Secondary | ICD-10-CM | POA: Diagnosis not present

## 2018-09-18 DIAGNOSIS — M6281 Muscle weakness (generalized): Secondary | ICD-10-CM | POA: Diagnosis not present

## 2018-09-21 DIAGNOSIS — R2689 Other abnormalities of gait and mobility: Secondary | ICD-10-CM | POA: Diagnosis not present

## 2018-09-21 DIAGNOSIS — R2681 Unsteadiness on feet: Secondary | ICD-10-CM | POA: Diagnosis not present

## 2018-09-21 DIAGNOSIS — R4182 Altered mental status, unspecified: Secondary | ICD-10-CM | POA: Diagnosis not present

## 2018-09-21 DIAGNOSIS — I2510 Atherosclerotic heart disease of native coronary artery without angina pectoris: Secondary | ICD-10-CM | POA: Diagnosis not present

## 2018-09-21 DIAGNOSIS — R339 Retention of urine, unspecified: Secondary | ICD-10-CM | POA: Diagnosis not present

## 2018-09-21 DIAGNOSIS — M6281 Muscle weakness (generalized): Secondary | ICD-10-CM | POA: Diagnosis not present

## 2018-09-26 DIAGNOSIS — I2510 Atherosclerotic heart disease of native coronary artery without angina pectoris: Secondary | ICD-10-CM | POA: Diagnosis not present

## 2018-09-26 DIAGNOSIS — M6281 Muscle weakness (generalized): Secondary | ICD-10-CM | POA: Diagnosis not present

## 2018-09-26 DIAGNOSIS — R2681 Unsteadiness on feet: Secondary | ICD-10-CM | POA: Diagnosis not present

## 2018-09-26 DIAGNOSIS — R339 Retention of urine, unspecified: Secondary | ICD-10-CM | POA: Diagnosis not present

## 2018-09-26 DIAGNOSIS — R4182 Altered mental status, unspecified: Secondary | ICD-10-CM | POA: Diagnosis not present

## 2018-09-26 DIAGNOSIS — R2689 Other abnormalities of gait and mobility: Secondary | ICD-10-CM | POA: Diagnosis not present

## 2018-10-18 ENCOUNTER — Ambulatory Visit: Payer: Medicare Other | Admitting: Cardiovascular Disease

## 2018-11-01 DIAGNOSIS — N401 Enlarged prostate with lower urinary tract symptoms: Secondary | ICD-10-CM | POA: Diagnosis not present

## 2018-11-01 DIAGNOSIS — G4700 Insomnia, unspecified: Secondary | ICD-10-CM | POA: Diagnosis not present

## 2018-11-01 DIAGNOSIS — K219 Gastro-esophageal reflux disease without esophagitis: Secondary | ICD-10-CM | POA: Diagnosis not present

## 2018-11-01 DIAGNOSIS — R338 Other retention of urine: Secondary | ICD-10-CM | POA: Diagnosis not present

## 2018-11-01 DIAGNOSIS — F0390 Unspecified dementia without behavioral disturbance: Secondary | ICD-10-CM | POA: Diagnosis not present

## 2018-11-01 DIAGNOSIS — E559 Vitamin D deficiency, unspecified: Secondary | ICD-10-CM | POA: Diagnosis not present

## 2018-11-01 DIAGNOSIS — M150 Primary generalized (osteo)arthritis: Secondary | ICD-10-CM | POA: Diagnosis not present

## 2018-11-01 DIAGNOSIS — M6281 Muscle weakness (generalized): Secondary | ICD-10-CM | POA: Diagnosis not present

## 2018-11-01 DIAGNOSIS — R54 Age-related physical debility: Secondary | ICD-10-CM | POA: Diagnosis not present

## 2018-11-01 DIAGNOSIS — I2510 Atherosclerotic heart disease of native coronary artery without angina pectoris: Secondary | ICD-10-CM | POA: Diagnosis not present

## 2018-11-01 DIAGNOSIS — K5900 Constipation, unspecified: Secondary | ICD-10-CM | POA: Diagnosis not present

## 2018-11-02 DIAGNOSIS — R2689 Other abnormalities of gait and mobility: Secondary | ICD-10-CM | POA: Diagnosis not present

## 2018-11-02 DIAGNOSIS — R54 Age-related physical debility: Secondary | ICD-10-CM | POA: Diagnosis not present

## 2018-11-02 DIAGNOSIS — K219 Gastro-esophageal reflux disease without esophagitis: Secondary | ICD-10-CM | POA: Diagnosis not present

## 2018-11-02 DIAGNOSIS — R41841 Cognitive communication deficit: Secondary | ICD-10-CM | POA: Diagnosis not present

## 2018-11-02 DIAGNOSIS — M6281 Muscle weakness (generalized): Secondary | ICD-10-CM | POA: Diagnosis not present

## 2018-11-02 DIAGNOSIS — N401 Enlarged prostate with lower urinary tract symptoms: Secondary | ICD-10-CM | POA: Diagnosis not present

## 2018-11-02 DIAGNOSIS — I2510 Atherosclerotic heart disease of native coronary artery without angina pectoris: Secondary | ICD-10-CM | POA: Diagnosis not present

## 2018-11-02 DIAGNOSIS — R262 Difficulty in walking, not elsewhere classified: Secondary | ICD-10-CM | POA: Diagnosis not present

## 2018-11-02 DIAGNOSIS — E559 Vitamin D deficiency, unspecified: Secondary | ICD-10-CM | POA: Diagnosis not present

## 2018-11-02 DIAGNOSIS — R4182 Altered mental status, unspecified: Secondary | ICD-10-CM | POA: Diagnosis not present

## 2018-11-02 DIAGNOSIS — R2681 Unsteadiness on feet: Secondary | ICD-10-CM | POA: Diagnosis not present

## 2018-11-03 ENCOUNTER — Other Ambulatory Visit: Payer: Self-pay

## 2018-11-03 DIAGNOSIS — R262 Difficulty in walking, not elsewhere classified: Secondary | ICD-10-CM | POA: Diagnosis not present

## 2018-11-03 DIAGNOSIS — R2681 Unsteadiness on feet: Secondary | ICD-10-CM | POA: Diagnosis not present

## 2018-11-03 DIAGNOSIS — R41841 Cognitive communication deficit: Secondary | ICD-10-CM | POA: Diagnosis not present

## 2018-11-03 DIAGNOSIS — R4182 Altered mental status, unspecified: Secondary | ICD-10-CM | POA: Diagnosis not present

## 2018-11-03 DIAGNOSIS — N401 Enlarged prostate with lower urinary tract symptoms: Secondary | ICD-10-CM | POA: Diagnosis not present

## 2018-11-03 DIAGNOSIS — M6281 Muscle weakness (generalized): Secondary | ICD-10-CM | POA: Diagnosis not present

## 2018-11-03 MED ORDER — FINASTERIDE 5 MG PO TABS: 5.0000 mg | ORAL_TABLET | Freq: Every day | ORAL | 2 refills | Status: DC

## 2018-11-06 DIAGNOSIS — R262 Difficulty in walking, not elsewhere classified: Secondary | ICD-10-CM | POA: Diagnosis not present

## 2018-11-06 DIAGNOSIS — R41841 Cognitive communication deficit: Secondary | ICD-10-CM | POA: Diagnosis not present

## 2018-11-06 DIAGNOSIS — R4182 Altered mental status, unspecified: Secondary | ICD-10-CM | POA: Diagnosis not present

## 2018-11-06 DIAGNOSIS — N401 Enlarged prostate with lower urinary tract symptoms: Secondary | ICD-10-CM | POA: Diagnosis not present

## 2018-11-06 DIAGNOSIS — M6281 Muscle weakness (generalized): Secondary | ICD-10-CM | POA: Diagnosis not present

## 2018-11-06 DIAGNOSIS — R2681 Unsteadiness on feet: Secondary | ICD-10-CM | POA: Diagnosis not present

## 2018-11-07 DIAGNOSIS — R2681 Unsteadiness on feet: Secondary | ICD-10-CM | POA: Diagnosis not present

## 2018-11-07 DIAGNOSIS — M6281 Muscle weakness (generalized): Secondary | ICD-10-CM | POA: Diagnosis not present

## 2018-11-07 DIAGNOSIS — N401 Enlarged prostate with lower urinary tract symptoms: Secondary | ICD-10-CM | POA: Diagnosis not present

## 2018-11-07 DIAGNOSIS — R41841 Cognitive communication deficit: Secondary | ICD-10-CM | POA: Diagnosis not present

## 2018-11-07 DIAGNOSIS — R4182 Altered mental status, unspecified: Secondary | ICD-10-CM | POA: Diagnosis not present

## 2018-11-07 DIAGNOSIS — R262 Difficulty in walking, not elsewhere classified: Secondary | ICD-10-CM | POA: Diagnosis not present

## 2018-11-08 DIAGNOSIS — K219 Gastro-esophageal reflux disease without esophagitis: Secondary | ICD-10-CM | POA: Diagnosis not present

## 2018-11-08 DIAGNOSIS — M150 Primary generalized (osteo)arthritis: Secondary | ICD-10-CM | POA: Diagnosis not present

## 2018-11-08 DIAGNOSIS — E559 Vitamin D deficiency, unspecified: Secondary | ICD-10-CM | POA: Diagnosis not present

## 2018-11-08 DIAGNOSIS — N401 Enlarged prostate with lower urinary tract symptoms: Secondary | ICD-10-CM | POA: Diagnosis not present

## 2018-11-08 DIAGNOSIS — F419 Anxiety disorder, unspecified: Secondary | ICD-10-CM | POA: Diagnosis not present

## 2018-11-08 DIAGNOSIS — K5900 Constipation, unspecified: Secondary | ICD-10-CM | POA: Diagnosis not present

## 2018-11-08 DIAGNOSIS — R54 Age-related physical debility: Secondary | ICD-10-CM | POA: Diagnosis not present

## 2018-11-08 DIAGNOSIS — G4700 Insomnia, unspecified: Secondary | ICD-10-CM | POA: Diagnosis not present

## 2018-11-08 DIAGNOSIS — I2510 Atherosclerotic heart disease of native coronary artery without angina pectoris: Secondary | ICD-10-CM | POA: Diagnosis not present

## 2018-11-08 DIAGNOSIS — M6281 Muscle weakness (generalized): Secondary | ICD-10-CM | POA: Diagnosis not present

## 2018-11-08 DIAGNOSIS — R338 Other retention of urine: Secondary | ICD-10-CM | POA: Diagnosis not present

## 2018-11-08 DIAGNOSIS — F0390 Unspecified dementia without behavioral disturbance: Secondary | ICD-10-CM | POA: Diagnosis not present

## 2018-11-27 ENCOUNTER — Other Ambulatory Visit: Payer: Self-pay

## 2018-11-27 ENCOUNTER — Other Ambulatory Visit (INDEPENDENT_AMBULATORY_CARE_PROVIDER_SITE_OTHER): Payer: Medicare Other

## 2018-11-27 ENCOUNTER — Ambulatory Visit (INDEPENDENT_AMBULATORY_CARE_PROVIDER_SITE_OTHER): Payer: Medicare Other | Admitting: Internal Medicine

## 2018-11-27 ENCOUNTER — Encounter: Payer: Self-pay | Admitting: Internal Medicine

## 2018-11-27 DIAGNOSIS — F4321 Adjustment disorder with depressed mood: Secondary | ICD-10-CM | POA: Diagnosis not present

## 2018-11-27 DIAGNOSIS — I5042 Chronic combined systolic (congestive) and diastolic (congestive) heart failure: Secondary | ICD-10-CM | POA: Diagnosis not present

## 2018-11-27 DIAGNOSIS — I255 Ischemic cardiomyopathy: Secondary | ICD-10-CM

## 2018-11-27 DIAGNOSIS — I10 Essential (primary) hypertension: Secondary | ICD-10-CM

## 2018-11-27 DIAGNOSIS — E785 Hyperlipidemia, unspecified: Secondary | ICD-10-CM | POA: Diagnosis not present

## 2018-11-27 LAB — HEPATIC FUNCTION PANEL
ALT: 14 U/L (ref 0–53)
AST: 20 U/L (ref 0–37)
Albumin: 3.8 g/dL (ref 3.5–5.2)
Alkaline Phosphatase: 59 U/L (ref 39–117)
Bilirubin, Direct: 0.1 mg/dL (ref 0.0–0.3)
Total Bilirubin: 0.7 mg/dL (ref 0.2–1.2)
Total Protein: 7.3 g/dL (ref 6.0–8.3)

## 2018-11-27 LAB — BASIC METABOLIC PANEL
BUN: 21 mg/dL (ref 6–23)
CO2: 30 mEq/L (ref 19–32)
Calcium: 9.3 mg/dL (ref 8.4–10.5)
Chloride: 100 mEq/L (ref 96–112)
Creatinine, Ser: 1.1 mg/dL (ref 0.40–1.50)
GFR: 62.44 mL/min (ref >60.00)
Glucose, Bld: 142 mg/dL — ABNORMAL HIGH (ref 70–99)
Potassium: 4.6 mEq/L (ref 3.5–5.1)
Sodium: 137 mEq/L (ref 135–145)

## 2018-11-27 NOTE — Assessment & Plan Note (Signed)
BP Readings from Last 3 Encounters:  11/27/18 124/64  08/04/18 114/61  07/26/18 126/64

## 2018-11-27 NOTE — Progress Notes (Signed)
Subjective:  Patient ID: [name], male    DOB: Oct 09, 1923  Age: 85 y.o. MRN: [id number]  CC: No chief complaint on file.   HPI [name] presents for CHF, CAD, memory loss  Outpatient Medications Prior to Visit  Medication Sig Dispense Refill  . aspirin 81 MG EC tablet Take 81 mg by mouth daily.      [name] b complex vitamins tablet Take 1 tablet by mouth daily.      . Cholecalciferol 1000 UNITS tablet Take 1,000 Units by mouth daily.      . finasteride (PROSCAR) 5 MG tablet Take 1 tablet (5 mg total) by mouth daily. 90 tablet 2  . Glucosamine-Chondroit-Vit C-Mn (GLUCOSAMINE 1500 COMPLEX PO) Take 1 tablet by mouth 2 (two) times daily.     . nitroGLYCERIN (NITROSTAT) 0.4 MG SL tablet Place 1 tablet (0.4 mg total) under the tongue every 5 (five) minutes as needed for chest pain (Please call your doctor/911 if no improvement after the second dose). 25 tablet 0  . pantoprazole (PROTONIX) 40 MG tablet Take 1 tablet (40 mg total) by mouth daily. 30 tablet 5  . polyethylene glycol (MIRALAX / GLYCOLAX) packet Take 17 g by mouth daily.    . simvastatin (ZOCOR) 80 MG tablet Take 1 tablet (80 mg total) by mouth daily. 90 tablet 0  . tamsulosin (FLOMAX) 0.4 MG CAPS capsule Take 1 capsule (0.4 mg total) by mouth daily after breakfast. 30 capsule 3  . vitamin C (ASCORBIC ACID) 500 MG tablet Take 500 mg by mouth daily.     No facility-administered medications prior to visit.     ROS: Review of Systems  Constitutional: Positive for fatigue and unexpected weight change. Negative for appetite change.  HENT: Negative for congestion, nosebleeds, sneezing, sore throat and trouble swallowing.   Eyes: Negative for itching and visual disturbance.  Respiratory: Negative for cough.   Cardiovascular: Negative for chest pain, palpitations and leg swelling.  Gastrointestinal: Negative for abdominal distention, blood in stool, diarrhea and nausea.  Genitourinary: Negative for frequency and hematuria.   Musculoskeletal: Positive for gait problem. Negative for arthralgias, joint swelling and neck pain.  Skin: Negative for rash.  Neurological: Negative for dizziness, tremors, speech difficulty and weakness.  Psychiatric/Behavioral: Positive for confusion and decreased concentration. Negative for agitation, dysphoric mood, sleep disturbance and suicidal ideas. The patient is not nervous/anxious.     Objective:  BP 124/64 (BP Location: Left Arm, Patient Position: Sitting, Cuff Size: Normal)   Pulse 67   Temp 98.7 F (37.1 C) (Oral)   Ht 5\' 9"  (1.753 m)   Wt 138 lb (62.6 kg)   SpO2 97%   BMI 20.38 kg/m   BP Readings from Last 3 Encounters:  11/27/18 124/64  08/04/18 114/61  07/26/18 126/64    Wt Readings from Last 3 Encounters:  11/27/18 138 lb (62.6 kg)  08/04/18 139 lb 8.8 oz (63.3 kg)  07/26/18 146 lb (66.2 kg)    Physical Exam Constitutional:      General: He is not in acute distress.    Appearance: He is well-developed.     Comments: NAD  Eyes:     Conjunctiva/sclera: Conjunctivae normal.     Pupils: Pupils are equal, round, and reactive to light.  Neck:     Musculoskeletal: Normal range of motion.     Thyroid: No thyromegaly.     Vascular: No JVD.  Cardiovascular:     Rate and Rhythm: Normal rate and regular rhythm.  Heart sounds: Normal heart sounds. No murmur. No friction rub. No gallop.   Pulmonary:     Effort: Pulmonary effort is normal. No respiratory distress.     Breath sounds: Normal breath sounds. No wheezing or rales.  Chest:     Chest wall: No tenderness.  Abdominal:     General: Bowel sounds are normal. There is no distension.     Palpations: Abdomen is soft. There is no mass.     Tenderness: There is no abdominal tenderness. There is no guarding or rebound.  Musculoskeletal: Normal range of motion.        General: No tenderness.  Lymphadenopathy:     Cervical: No cervical adenopathy.  Skin:    General: Skin is warm and dry.     Findings:  No rash.  Neurological:     Mental Status: He is alert.     Cranial Nerves: No cranial nerve deficit.     Motor: No abnormal muscle tone.     Coordination: Coordination normal.     Gait: Gait abnormal.     Deep Tendon Reflexes: Reflexes are normal and symmetric.  Psychiatric:        Behavior: Behavior normal.        Thought Content: Thought content normal.        Judgment: Judgment normal.    Alert, cooperative  Lab Results  Component Value Date   WBC 9.4 08/03/2018   HGB 13.9 08/03/2018   HCT 40.8 08/03/2018   PLT 188 08/03/2018   GLUCOSE 138 (H) 08/03/2018   CHOL 159 04/22/2017   TRIG 68 04/22/2017   HDL 72 04/22/2017   LDLCALC 73 04/22/2017   ALT 19 08/03/2018   AST 28 08/03/2018   NA 137 08/03/2018   K 4.5 08/03/2018   CL 104 08/03/2018   CREATININE 1.25 (H) 08/03/2018   BUN 17 08/03/2018   CO2 23 08/03/2018   TSH 3.57 12/15/2017   PSA 6.45 (H) 08/07/2013   INR 1.1 08/02/2018   HGBA1C 6.4 12/05/2012    Dg Chest 2 View  Result Date: 08/02/2018 CLINICAL DATA:  Chest pain EXAM: CHEST - 2 VIEW COMPARISON:  April 22, 2017. FINDINGS: The heart size is stable. The lungs are somewhat hyperexpanded. There appear to be emphysematous changes bilaterally with scattered areas of pleuroparenchymal scarring. There are degenerative changes throughout the visualized thoracic spine without evidence of an acute displaced fracture. There are degenerative changes of both glenohumeral joints. There are aortic calcifications. There are coronary artery calcifications versus coronary artery stents. IMPRESSION: No active cardiopulmonary disease. Electronically Signed   By: Constance Holster M.D.   On: 08/02/2018 14:04   Ct Angio Chest/abd/pel For Dissection W And/or Wo Contrast  Result Date: 08/02/2018 CLINICAL DATA:  Mid chest pain.  Back pain. EXAM: CT ANGIOGRAPHY CHEST, ABDOMEN AND PELVIS TECHNIQUE: Multidetector CT imaging through the chest, abdomen and pelvis was performed using the  standard protocol during bolus administration of intravenous contrast. Multiplanar reconstructed images and MIPs were obtained and reviewed to evaluate the vascular anatomy. CONTRAST:  164mL OMNIPAQUE IOHEXOL 350 MG/ML SOLN COMPARISON:  CT chest 04/22/2017 FINDINGS: CTA CHEST FINDINGS Cardiovascular: The initial noncontrast images demonstrate no evidence of acute intramural hematoma. Coronary, aortic arch, and branch vessel atherosclerotic vascular disease. No thoracic aortic dissection is identified. The the branch vessels appear widely patent and both vertebral arteries appear patent in the upper thorax. Although today's exam was optimized for systemic and not pulmonary arterial opacification, the pulmonary arteries are well  opacified and no filling defect is identified in the pulmonary arterial tree. Mediastinum/Nodes: Right lower paratracheal node 1.0 cm in short axis on image 55/4, previously the same on 04/22/2017. Left lower paratracheal node 0.9 cm in short axis on image 49/7, likewise stable. No overtly pathologic adenopathy in the chest. Lungs/Pleura: Biapical pleuroparenchymal scarring, stable. Peripheral interstitial accentuation in both lungs. Mild airway plugging anteriorly in the left upper lobe shown on image 69/8. Mild scarring posteriorly in the right upper lobe adjacent to the major fissure. Musculoskeletal: Asymmetric degenerative sternoclavicular arthropathy, right greater than left. Mild thoracic kyphosis and moderate spondylosis. Review of the MIP images confirms the above findings. CTA ABDOMEN AND PELVIS FINDINGS VASCULAR Aorta: Aortoiliac atherosclerotic vascular disease. No aneurysm or dissection. Celiac: Atheromatous plaque along the upper margin of the origin but the celiac artery appears patent and eventually of psoriasis to the proper hepatic artery. SMA: There is some atheromatous plaque about 2.4 cm from the origin but the SMA remains widely patent. Renals: Single bilateral renal  arteries appear patent although there is some early branching of the left renal artery. IMA: Patent. Inflow: Atheromatous calcification with some chronic mural thrombus along the right common iliac artery. Veins: The veins are not significantly opacified with contrast. No discrete abnormality is identified. Review of the MIP images confirms the above findings. NON-VASCULAR Hepatobiliary: The arterial phase appearance of the liver is unremarkable. The gallbladder is mildly obscured by motion artifact but without discrete abnormality. Pancreas: Partially obscured by motion artifact but otherwise unremarkable. Spleen: Unremarkable Adrenals/Urinary Tract: Fullness of the left adrenal gland without discrete mass. Small hypodense renal lesions are technically too small to characterize but most likely represent cysts. 2.6 by 2.1 cm hypodense lesion of the left mid kidney posteriorly on image 147/7 is slightly higher than fluid density, most likely a complex cyst. 1.9 by 1.7 cm hypodense lesion of the left kidney lower pole, fluid density and likely a cyst. Approximately 1.6 by 1.3 cm hypodense lesion of the right mid kidney medially, probably a cyst although slightly higher than fluid density. Stomach/Bowel: Sigmoid colon diverticulosis. Lymphatic: No pathologic adenopathy is identified. Reproductive: The right testicle appears to be retracted up nearly to the inguinal ring. Prostate gland partially obscured by streak artifact from the patient's right hip prosthesis but appears grossly unremarkable. Other: No supplemental non-categorized findings. Musculoskeletal: Right total hip prosthesis. Bridging spurring of both sacroiliac joints. Grade 1 degenerative anterolisthesis at L4-5 with right foraminal impingement at L3-4 and L4-5 and left foraminal impingement at L3-4 primarily from facet spurring. Review of the MIP images confirms the above findings. IMPRESSION: 1. No aortic dissection or acute vascular findings. No  findings of pulmonary embolus in the lungs. 2. Other imaging findings of potential clinical significance: Aortic Atherosclerosis (ICD10-I70.0). Coronary atherosclerosis. Mild peripheral interstitial accentuation in both lungs, query mild fibrosis. Minimal airway plugging anteriorly in the left upper lobe. Thoracic kyphosis with spondylosis. Lumbar impingement at L3-4 and L4-5. Bilateral hypodense renal lesions are likely cysts although only characterized during a single phase today, and does not entirely specific. Sigmoid colon diverticulosis. Retracted right testicle nearly back to the inguinal ring. Electronically Signed   By: Van Clines M.D.   On: 08/02/2018 17:37   US Abdomen Limited Ruq  Result Date: 08/02/2018 CLINICAL DATA:  Epigastric pain EXAM: ULTRASOUND ABDOMEN LIMITED RIGHT UPPER QUADRANT COMPARISON:  None. FINDINGS: Gallbladder: No gallstones or wall thickening visualized. No sonographic Murphy sign noted by sonographer. Common bile duct: Diameter: 0.5 cm Liver: No focal lesion  identified. Within normal limits in parenchymal echogenicity. Portal vein is patent on color Doppler imaging with normal direction of blood flow towards the liver. Incidentally noted is a small volume of abdominal ascites. IMPRESSION: 1. No sonographic evidence for cholelithiasis or acute cholecystitis. 2. Trace ascites noted. Electronically Signed   By: Constance Holster M.D.   On: 08/02/2018 20:38    Assessment & Plan:   There are no diagnoses linked to this encounter.   No orders of the defined types were placed in this encounter.    Follow-up: No follow-ups on file.  Walker Kehr, MD

## 2018-11-27 NOTE — Assessment & Plan Note (Signed)
Doing well 

## 2018-11-27 NOTE — Assessment & Plan Note (Signed)
Doing fair 

## 2018-11-27 NOTE — Assessment & Plan Note (Signed)
Simvastatin 

## 2018-12-02 ENCOUNTER — Encounter

## 2019-01-04 ENCOUNTER — Other Ambulatory Visit: Payer: Self-pay | Admitting: Internal Medicine

## 2019-01-04 MED ORDER — SIMVASTATIN 80 MG PO TABS: 80.0000 mg | ORAL_TABLET | Freq: Every day | ORAL | 0 refills | Status: DC

## 2019-01-04 NOTE — Telephone Encounter (Signed)
Medication Refill - Medication: simvastatin (ZOCOR) 80 MG tablet [id number]     Preferred Pharmacy (with phone number or street name): [facility], [facility] Address [address], , [facility] [place], [id number] Phone Number [phone] Fax Number [phone]  Agent: Please be advised that RX refills may take up to 3 business days. We ask that you follow-up with your pharmacy.     90 day supply

## 2019-02-17 ENCOUNTER — Observation Stay (HOSPITAL_COMMUNITY)
Admission: EM | Admit: 2019-02-17 | Discharge: 2019-02-18 | Disposition: A | Discharge status: Home / Self Care | Payer: Medicare Other | Attending: Internal Medicine | Admitting: Internal Medicine

## 2019-02-17 ENCOUNTER — Encounter (HOSPITAL_COMMUNITY): Payer: Self-pay | Admitting: Emergency Medicine

## 2019-02-17 ENCOUNTER — Emergency Department (HOSPITAL_COMMUNITY): Payer: Medicare Other

## 2019-02-17 DIAGNOSIS — Z7982 Long term (current) use of aspirin: Secondary | ICD-10-CM | POA: Diagnosis not present

## 2019-02-17 DIAGNOSIS — F0390 Unspecified dementia without behavioral disturbance: Secondary | ICD-10-CM | POA: Diagnosis not present

## 2019-02-17 DIAGNOSIS — I25118 Atherosclerotic heart disease of native coronary artery with other forms of angina pectoris: Secondary | ICD-10-CM

## 2019-02-17 DIAGNOSIS — R0789 Other chest pain: Secondary | ICD-10-CM | POA: Diagnosis not present

## 2019-02-17 DIAGNOSIS — R079 Chest pain, unspecified: Principal | ICD-10-CM | POA: Insufficient documentation

## 2019-02-17 DIAGNOSIS — I5042 Chronic combined systolic (congestive) and diastolic (congestive) heart failure: Secondary | ICD-10-CM | POA: Diagnosis not present

## 2019-02-17 DIAGNOSIS — I110 Hypertensive heart disease with heart failure: Secondary | ICD-10-CM | POA: Diagnosis not present

## 2019-02-17 DIAGNOSIS — I10 Essential (primary) hypertension: Secondary | ICD-10-CM | POA: Diagnosis present

## 2019-02-17 DIAGNOSIS — Z79899 Other long term (current) drug therapy: Secondary | ICD-10-CM | POA: Diagnosis not present

## 2019-02-17 DIAGNOSIS — Z85828 Personal history of other malignant neoplasm of skin: Secondary | ICD-10-CM | POA: Diagnosis not present

## 2019-02-17 DIAGNOSIS — I2510 Atherosclerotic heart disease of native coronary artery without angina pectoris: Secondary | ICD-10-CM | POA: Insufficient documentation

## 2019-02-17 DIAGNOSIS — I208 Other forms of angina pectoris: Secondary | ICD-10-CM | POA: Diagnosis not present

## 2019-02-17 DIAGNOSIS — E785 Hyperlipidemia, unspecified: Secondary | ICD-10-CM | POA: Insufficient documentation

## 2019-02-17 DIAGNOSIS — R0602 Shortness of breath: Secondary | ICD-10-CM

## 2019-02-17 DIAGNOSIS — Z20822 Contact with and (suspected) exposure to covid-19: Secondary | ICD-10-CM | POA: Insufficient documentation

## 2019-02-17 LAB — BASIC METABOLIC PANEL
Anion gap: 12 (ref 5–15)
BUN: 17 mg/dL (ref 8–23)
CO2: 23 mmol/L (ref 22–32)
Calcium: 9.4 mg/dL (ref 8.9–10.3)
Chloride: 104 mmol/L (ref 98–111)
Creatinine, Ser: 1.13 mg/dL (ref 0.61–1.24)
GFR calc Af Amer: >60 mL/min (ref >60)
GFR calc non Af Amer: 56 mL/min — ABNORMAL LOW (ref >60)
Glucose, Bld: 114 mg/dL — ABNORMAL HIGH (ref 70–99)
Potassium: 4.2 mmol/L (ref 3.5–5.1)
Sodium: 139 mmol/L (ref 135–145)

## 2019-02-17 LAB — CBC
HCT: 42.5 % (ref 39.0–52.0)
Hemoglobin: 13.9 g/dL (ref 13.0–17.0)
MCH: 30.9 pg (ref 26.0–34.0)
MCHC: 32.7 g/dL (ref 30.0–36.0)
MCV: 94.4 fL (ref 80.0–100.0)
Platelets: 191 10*3/uL (ref 150–400)
RBC: 4.5 MIL/uL (ref 4.22–5.81)
RDW: 14.3 % (ref 11.5–15.5)
WBC: 7.5 10*3/uL (ref 4.0–10.5)
nRBC: 0 % (ref 0.0–0.2)

## 2019-02-17 LAB — SARS CORONAVIRUS 2 (TAT 6-24 HRS): SARS Coronavirus 2: NEGATIVE

## 2019-02-17 LAB — TROPONIN I (HIGH SENSITIVITY)
Troponin I (High Sensitivity): 15 ng/L (ref <18)
Troponin I (High Sensitivity): 16 ng/L (ref <18)

## 2019-02-17 MED ORDER — ATORVASTATIN CALCIUM 40 MG PO TABS
40.0000 mg | ORAL_TABLET | Freq: Every day | ORAL | Status: DC
  Administered 2019-02-17: 20:00:00 40 mg via ORAL
  Filled 2019-02-17: qty 1

## 2019-02-17 MED ORDER — ENOXAPARIN SODIUM 40 MG/0.4ML ~~LOC~~ SOLN
40.0000 mg | SUBCUTANEOUS | Status: DC
  Administered 2019-02-17: 20:00:00 40 mg via SUBCUTANEOUS
  Filled 2019-02-17: qty 0.4

## 2019-02-17 MED ORDER — TAMSULOSIN HCL 0.4 MG PO CAPS
0.4000 mg | ORAL_CAPSULE | Freq: Every day | ORAL | Status: DC
  Administered 2019-02-18: 12:00:00 0.4 mg via ORAL
  Filled 2019-02-17: qty 1

## 2019-02-17 MED ORDER — ACETAMINOPHEN 325 MG PO TABS: 650.0000 mg | ORAL_TABLET | ORAL | Status: DC | PRN

## 2019-02-17 MED ORDER — PANTOPRAZOLE SODIUM 40 MG PO TBEC
40.0000 mg | DELAYED_RELEASE_TABLET | Freq: Every day | ORAL | Status: DC
  Administered 2019-02-18: 40 mg via ORAL
  Filled 2019-02-17: qty 1

## 2019-02-17 MED ORDER — SODIUM CHLORIDE 0.9% FLUSH: 3.0000 mL | Freq: Once | INTRAVENOUS | Status: DC

## 2019-02-17 MED ORDER — ONDANSETRON HCL 4 MG/2ML IJ SOLN: 4.0000 mg | Freq: Four times a day (QID) | INTRAMUSCULAR | Status: DC | PRN

## 2019-02-17 MED ORDER — POLYETHYLENE GLYCOL 3350 17 G PO PACK
17.0000 g | PACK | Freq: Every day | ORAL | Status: DC
  Administered 2019-02-18: 12:00:00 17 g via ORAL
  Filled 2019-02-17: qty 1

## 2019-02-17 MED ORDER — ASPIRIN EC 81 MG PO TBEC
81.0000 mg | DELAYED_RELEASE_TABLET | Freq: Every day | ORAL | Status: DC
  Administered 2019-02-18: 12:00:00 81 mg via ORAL
  Filled 2019-02-17: qty 1

## 2019-02-17 MED ORDER — FINASTERIDE 5 MG PO TABS
5.0000 mg | ORAL_TABLET | Freq: Every day | ORAL | Status: DC
  Administered 2019-02-18: 12:00:00 5 mg via ORAL
  Filled 2019-02-17 (×2): qty 1

## 2019-02-17 NOTE — Consult Note (Addendum)
Cardiology Consult    Patient ID: [name]; [id number]; 1924/01/16   Admit date: 02/17/2019 Date of Consult: 02/17/2019  Primary Care Provider: Cassandria Anger, MD Primary Cardiologist: Lauree Chandler, MD   Patient Profile    [name] is a 86 y.o. male with past medical history of CAD (s/p BMS to LCx in 2002, DES to mid-LAD in 2007), ischemic cardiomyopathy (EF 45-50% by echo in 04/2017), HTN, HLD, mild AI, mild MR and dementia who is being seen today for the evaluation of chest pain at the request of Dr. Sherry Ruffing.   History of Present Illness    [name] most recently had a phone visit with Ermalinda Barrios, PA-C in 07/2018 and reported overall doing well, denying any recent chest pain. Had previously had dyspnea and this improved with Coreg being reduced to 3.125mg  BID and HR has been in the mid-40's to 50's when checked at home. Given his persistent bradycardia, it was recommended he stop Coreg and follow-up in 1 month but he has not been evaluated since.   He presented to [name] this AM for evaluation of chest pain with associated dyspnea. In talking with the patient today, he reports having intermittent episodes of chest pain over the past 3 to 4 weeks. He is unable to provide details on how frequently the episodes last but says they typically occur at rest. Unsure if they resemble his prior angina. He has been "pounding his chest" when this occurs to see if this will help with his symptoms. This morning, his pain intensified, therefore he notified his wife.  He took 2 SL NTG with resolution of his symptoms. He denies any recurrent pain throughout the day.  No recent orthopnea, PND or lower extremity edema  Initial labs show WBC 7.5, Hgb 13.9, platelets 191, Na+ 139, K+ 4.2 and creatinine 1.13. Initial and delta HS Troponin values negative at 15 and 16. CXR shows atherosclerotic disease and tortuosity of the aorta. Initial EKG had baseline artifact. Repeat  tracing shows sinus bradycardia, HR 56 with LAFB and flattened T-waves along lateral leads. No acute ST abnormalities when compared to prior tracings.    Past Medical History:  Diagnosis Date  . Coronary atherosclerosis of unspecified type of vessel, native or graft   . Dizziness and giddiness   . Hypertrophy of prostate without urinary obstruction and other lower urinary tract symptoms (LUTS)   . MI (myocardial infarction) ([place]) 2002  . Osteoarthrosis, unspecified whether generalized or localized, unspecified site   . Other and unspecified hyperlipidemia   . Shortness of breath dyspnea    with exertion  . Skin cancer    "top of his head"  . Thrombocytopenia, unspecified ([place])   . Unspecified essential hypertension     Past Surgical History:  Procedure Laterality Date  . COLONOSCOPY    . CORONARY ANGIOPLASTY  01/07/2001, 2008   2 stents  . ESOPHAGOGASTRODUODENOSCOPY N/A 04/22/2014   Procedure: ESOPHAGOGASTRODUODENOSCOPY (EGD);  Surgeon: Milus Banister, MD;  Location: [facility];  Service: Endoscopy;  Laterality: N/A;  . HIP ARTHROPLASTY Right 2006  . JOINT REPLACEMENT    . MOHS SURGERY     "top of his head"  . TRANSURETHRAL RESECTION OF PROSTATE       Home Medications:  Prior to Admission medications   Medication Sig Start Date End Date Taking? Authorizing Provider  aspirin 81 MG EC tablet Take 81 mg by mouth daily.      [provider]  b complex vitamins tablet Take 1 tablet by mouth daily.      [provider]  Cholecalciferol 1000 UNITS tablet Take 1,000 Units by mouth daily.      [provider]  finasteride (PROSCAR) 5 MG tablet Take 1 tablet (5 mg total) by mouth daily. 11/03/18   Plotnikov, Evie Lacks, MD  Glucosamine-Chondroit-Vit C-Mn (GLUCOSAMINE 1500 COMPLEX PO) Take 1 tablet by mouth 2 (two) times daily.     [provider]  nitroGLYCERIN (NITROSTAT) 0.4 MG SL tablet Place 1 tablet (0.4 mg total) under the tongue every 5 (five)  minutes as needed for chest pain (Please call your doctor/911 if no improvement after the second dose). 10/11/17   Plotnikov, Evie Lacks, MD  pantoprazole (PROTONIX) 40 MG tablet Take 1 tablet (40 mg total) by mouth daily. 06/13/18   Plotnikov, Evie Lacks, MD  polyethylene glycol (MIRALAX / GLYCOLAX) packet Take 17 g by mouth daily.    [provider]  simvastatin (ZOCOR) 80 MG tablet Take 1 tablet (80 mg total) by mouth daily. 01/04/19   Plotnikov, Evie Lacks, MD  tamsulosin (FLOMAX) 0.4 MG CAPS capsule Take 1 capsule (0.4 mg total) by mouth daily after breakfast. 08/05/18   Nita Sells, MD  vitamin C (ASCORBIC ACID) 500 MG tablet Take 500 mg by mouth daily.    [provider]    Inpatient Medications: Scheduled Meds: . [START ON 02/18/2019] aspirin  81 mg Oral Daily  . atorvastatin  40 mg Oral q1800  . enoxaparin (LOVENOX) injection  40 mg Subcutaneous Q24H  . [START ON 02/18/2019] finasteride  5 mg Oral Daily  . [START ON 02/18/2019] pantoprazole  40 mg Oral Daily  . [START ON 02/18/2019] polyethylene glycol  17 g Oral Daily  . [START ON 02/18/2019] tamsulosin  0.4 mg Oral QPC breakfast   Continuous Infusions:  PRN Meds:   Allergies:    Allergies  Allergen Reactions  . Oxycodone Nausea And Vomiting  . Penicillins Hives    Social History:   Social History   Socioeconomic History  . Marital status: Married    Spouse name: [name]  . Number of children: 2  . Years of education: Not on file  . Highest education level: Not on file  Occupational History  . Occupation: Retired    Fish farm manager: RETIRED  Tobacco Use  . Smoking status: Never Smoker  . Smokeless tobacco: Never Used  Substance and Sexual Activity  . Alcohol use: No    Alcohol/week: 0.0 standard drinks  . Drug use: No  . Sexual activity: Not Currently  Other Topics Concern  . Not on file  Social History Narrative   Regular Exercise -  YES         Social Determinants of Health   Financial  Resource Strain:   . Difficulty of Paying Living Expenses: Not on file  Food Insecurity:   . Worried About Charity fundraiser in the Last Year: Not on file  . Ran Out of Food in the Last Year: Not on file  Transportation Needs:   . Lack of Transportation (Medical): Not on file  . Lack of Transportation (Non-Medical): Not on file  Physical Activity:   . Days of Exercise per Week: Not on file  . Minutes of Exercise per Session: Not on file  Stress:   . Feeling of Stress : Not on file  Social Connections:   . Frequency of Communication with Friends and Family: Not on file  . Frequency  of Social Gatherings with Friends and Family: Not on file  . Attends Religious Services: Not on file  . Active Member of Clubs or Organizations: Not on file  . Attends Archivist Meetings: Not on file  . Marital Status: Not on file  Intimate Partner Violence:   . Fear of Current or Ex-Partner: Not on file  . Emotionally Abused: Not on file  . Physically Abused: Not on file  . Sexually Abused: Not on file     Family History:    Family History  Problem Relation Age of Onset  . Heart disease Father   . Coronary artery disease Other   . Colon cancer Neg Hx       Review of Systems    General:  No chills, fever, night sweats or weight changes.  Cardiovascular:  No  dyspnea on exertion, edema, orthopnea, palpitations, paroxysmal nocturnal dyspnea. Positive for chest pain.  Dermatological: No rash, lesions/masses Respiratory: No cough, dyspnea Urologic: No hematuria, dysuria Abdominal:   No nausea, vomiting, diarrhea, bright red blood per rectum, melena, or hematemesis Neurologic:  No visual changes, wkns, changes in mental status. All other systems reviewed and are otherwise negative except as noted above.  Physical Exam/Data    Vitals:   02/17/19 1515 02/17/19 1530 02/17/19 1615 02/17/19 1630  BP: (!) 150/74 (!) 149/74 (!) 160/89 (!) 205/83  Pulse: (!) 55 (!) 54 (!) 56 72  Resp:  11 17 10 17   Temp:      SpO2: 97% 98% 97% 100%   No intake or output data in the 24 hours ending 02/17/19 1710 There were no vitals filed for this visit. There is no height or weight on file to calculate BMI.   General: Pleasant elderly Caucasian male appearing in NAD Psych: Normal affect. Neuro: Alert and oriented X 3. Moves all extremities spontaneously. HEENT: Normal  Neck: Supple without bruits or JVD. Lungs:  Resp regular and unlabored, CTA without wheezing or rales. Heart: RRR no s3, s4, or murmurs. Abdomen: Soft, non-tender, non-distended, BS + x 4.  Extremities: No clubbing, cyanosis or edema. DP/PT/Radials 2+ and equal bilaterally.   EKG:  The EKG was personally reviewed and demonstrates: Sinus bradycardia, HR 56 with LAFB and flattened T-waves along lateral leads. No acute ST abnormalities when compared to prior tracings.   Telemetry:  Telemetry was personally reviewed and demonstrates: Sinus bradycardia, HR in 50's with occasional PVC's.    Labs/Studies     Relevant CV Studies:  Echocardiogram: 04/2017 Study Conclusions  - Left ventricle: The cavity size was normal. Wall thickness was   increased in a pattern of mild LVH. Systolic function was mildly   reduced. The estimated ejection fraction was in the range of 45%   to 50%. There is akinesis of the basal-midinferoseptal   myocardium. Doppler parameters are consistent with abnormal left   ventricular relaxation (grade 1 diastolic dysfunction). - Aortic valve: Trileaflet; mildly thickened, mildly calcified   leaflets. There was mild regurgitation. - Mitral valve: There was mild regurgitation. - Left atrium: The atrium was mildly dilated. - Pulmonary arteries: Systolic pressure was mildly increased. PA   peak pressure: 48 mm Hg (S).   Laboratory Data:  Chemistry Recent Labs  Lab 02/17/19 1235  NA 139  K 4.2  CL 104  CO2 23  GLUCOSE 114*  BUN 17  CREATININE 1.13  CALCIUM 9.4  GFRNONAA 56*  GFRAA  >60  ANIONGAP 12    No results for input(s): PROT, ALBUMIN,  AST, ALT, ALKPHOS, BILITOT in the last 168 hours. Hematology Recent Labs  Lab 02/17/19 1235  WBC 7.5  RBC 4.50  HGB 13.9  HCT 42.5  MCV 94.4  MCH 30.9  MCHC 32.7  RDW 14.3  PLT 191   Cardiac EnzymesNo results for input(s): TROPONINI in the last 168 hours. No results for input(s): TROPIPOC in the last 168 hours.  BNPNo results for input(s): BNP, PROBNP in the last 168 hours.  DDimer No results for input(s): DDIMER in the last 168 hours.  Radiology/Studies:  DG Chest 2 View  Result Date: 02/17/2019 CLINICAL DATA:  Chest pain and slight shortness of breath. EXAM: CHEST - 2 VIEW COMPARISON:  August 02, 2018 FINDINGS: Cardiomediastinal silhouette is enlarged. Calcific atherosclerotic disease and tortuosity of the aorta. Mediastinal contours appear intact. There is no evidence of focal airspace consolidation, pleural effusion or pneumothorax. Increased elevation of the left hemidiaphragm. Osseous structures are without acute abnormality. Soft tissues are grossly normal. IMPRESSION: 1. Enlarged cardiac silhouette. 2. Calcific atherosclerotic disease and tortuosity of the aorta. 3. Increased elevation of the left hemidiaphragm. Electronically Signed   By: Fidela Salisbury M.D.   On: 02/17/2019 12:50     Assessment & Plan    1. Chest Pain with Mixed Typical and Atypical Features - He presents with a 3 to 4-week history of episodes of chest discomfort which have typically occurred at rest and spontaneously resolve. He reports a repeat episode this morning which was more intense and improved with SL NTG. Unable to elaborate on his symptoms given dementia. - Initial and delta HS Troponin values negative at 15 and 16. Initial EKG had baseline artifact. Repeat tracing shows sinus bradycardia, HR 56 with LAFB and flattened T-waves along lateral leads. No acute ST abnormalities when compared to prior tracings.  - reviewed with Dr. Sallyanne Kuster  and will plan for a [facility] tomorrow morning. If no significant ischemia, would favor medical management with consideration of adding Imdur.   2. CAD - s/p BMS to LCx in 2002 and DES to mid-LAD in 2007.  Plan for repeat ischemic evaluation as outlined above. - continue PTA ASA and statin therapy on admission. Not on BB given baseline bradycardia.   3. Ischemic Cardiomyopathy - EF 45-50% by echo in 04/2017.  He denies any recent orthopnea, PND or edema.  He does not appear volume overloaded by examination.  He is not on a beta-blocker due to baseline bradycardia.  BP is currently elevated.  If this remains the case, will consider initiation of ARB.  4. Valvular Heart Disease - He had mild MR and mild AI by echocardiogram in 2019. Continue to follow as an outpatient.   5. Dementia - he is A&Ox3 during this encounter but is unable to elaborate on the details of his recent symptoms as outlined above. I did communicate the plan of admission and stress testing with his wife who was in agreement. She was thankful for the update in his care.     For questions or updates, please contact [facility] Please consult [email or web address] for contact info under Cardiology/STEMI.  Signed, Erma Heritage, PA-C 02/17/2019, 5:10 PM Pager: [phone]  I have seen and examined the patient along with Erma Heritage, PA-C.  I have reviewed the chart, notes and new data.  I agree with PA/NP's note.  Key new complaints: Known CAD, but without events or revascularization in >10 years. History is variable and patchy, clearly some memory difficulties. Symptoms are atypical Key examination  changes: normal CV exam except occasional ectopic beats. Key new findings / data: sinus bradycardia with PACs, minor ECG repolarization abnormalities, normal cardiac enzymes.  PLAN: Difficult to have confidence in source of symptoms due to the issues with memory. No high risk objective findings. Likely to have  nondiagnostic coronary CTAngio due to previous stents and calcification. Plan Lexiscan Myoview. No indication for anticoagulation, unable to use beta blockers due to bradycardia.  Sanda Klein, MD, [place] [phone] 02/17/2019, 5:18 PM

## 2019-02-17 NOTE — H&P (Signed)
History and Physical    [name] [id number] DOB: 1923-05-02 DOA: 02/17/2019  PCP: Cassandria Anger, MD Consultants:  Angelena Form - cardiology Patient coming from: [place]; NOK: Wife, [name], [phone]  Chief Complaint: chest pain  HPI: [name] is a 86 y.o. male with medical history significant of HTN; HLD; chronic combined CHF; CAD s/p stent; dementia; and BPH presenting with chest pain.   He has mild dementia and is able to answer most questions.  He reports episodic intermittent chest pain over the last month.  He hasn't wanted to tell his wife and so would beat himself on the chest when she wasn't looking.  It happened again today and he finally told her and she sent him in for evaluation.  The CP resolved with ASA/NTG and has not recurred.    ED Course:   Chest pain, elevated Heart Score, needs r/o.  Non-radiating sternal pain, hard to know if exertional, +SOB, better with NTG, ASA.  Dr. Sallyanne Kuster recommends observation, will consult.  No asymptomatic.  Review of Systems: As per HPI; otherwise review of systems reviewed and negative.  Possibly inaccurate due to hearing loss (didn't bring hearing aids) and dementia.   Past Medical History:  Diagnosis Date  . Coronary atherosclerosis of unspecified type of vessel, native or graft   . Dizziness and giddiness   . Hypertrophy of prostate without urinary obstruction and other lower urinary tract symptoms (LUTS)   . MI (myocardial infarction) ([place]) 2002  . Osteoarthrosis, unspecified whether generalized or localized, unspecified site   . Other and unspecified hyperlipidemia   . Shortness of breath dyspnea    with exertion  . Skin cancer    "top of his head"  . Thrombocytopenia, unspecified ([place])   . Unspecified essential hypertension     Past Surgical History:  Procedure Laterality Date  . COLONOSCOPY    . CORONARY ANGIOPLASTY  01/07/2001, 2008   2 stents  . ESOPHAGOGASTRODUODENOSCOPY  N/A 04/22/2014   Procedure: ESOPHAGOGASTRODUODENOSCOPY (EGD);  Surgeon: Milus Banister, MD;  Location: [facility];  Service: Endoscopy;  Laterality: N/A;  . HIP ARTHROPLASTY Right 2006  . JOINT REPLACEMENT    . MOHS SURGERY     "top of his head"  . TRANSURETHRAL RESECTION OF PROSTATE      Social History   Socioeconomic History  . Marital status: Married    Spouse name: [name]  . Number of children: 2  . Years of education: Not on file  . Highest education level: Not on file  Occupational History  . Occupation: Retired    Fish farm manager: RETIRED  Tobacco Use  . Smoking status: Never Smoker  . Smokeless tobacco: Never Used  Substance and Sexual Activity  . Alcohol use: No    Alcohol/week: 0.0 standard drinks  . Drug use: No  . Sexual activity: Not Currently  Other Topics Concern  . Not on file  Social History Narrative   Regular Exercise -  YES         Social Determinants of Health   Financial Resource Strain:   . Difficulty of Paying Living Expenses: Not on file  Food Insecurity:   . Worried About Charity fundraiser in the Last Year: Not on file  . Ran Out of Food in the Last Year: Not on file  Transportation Needs:   . Lack of Transportation (Medical): Not on file  . Lack of Transportation (Non-Medical): Not on file  Physical Activity:   . Days  of Exercise per Week: Not on file  . Minutes of Exercise per Session: Not on file  Stress:   . Feeling of Stress : Not on file  Social Connections:   . Frequency of Communication with Friends and Family: Not on file  . Frequency of Social Gatherings with Friends and Family: Not on file  . Attends Religious Services: Not on file  . Active Member of Clubs or Organizations: Not on file  . Attends Archivist Meetings: Not on file  . Marital Status: Not on file  Intimate Partner Violence:   . Fear of Current or Ex-Partner: Not on file  . Emotionally Abused: Not on file  . Physically Abused: Not on file  . Sexually  Abused: Not on file    Allergies  Allergen Reactions  . Oxycodone Nausea And Vomiting  . Penicillins Hives    Family History  Problem Relation Age of Onset  . Heart disease Father   . Coronary artery disease Other   . Colon cancer Neg Hx     Prior to Admission medications   Medication Sig Start Date End Date Taking? Authorizing Provider  aspirin 81 MG EC tablet Take 81 mg by mouth daily.      [provider]  b complex vitamins tablet Take 1 tablet by mouth daily.      [provider]  Cholecalciferol 1000 UNITS tablet Take 1,000 Units by mouth daily.      [provider]  finasteride (PROSCAR) 5 MG tablet Take 1 tablet (5 mg total) by mouth daily. 11/03/18   Plotnikov, Evie Lacks, MD  Glucosamine-Chondroit-Vit C-Mn (GLUCOSAMINE 1500 COMPLEX PO) Take 1 tablet by mouth 2 (two) times daily.     [provider]  nitroGLYCERIN (NITROSTAT) 0.4 MG SL tablet Place 1 tablet (0.4 mg total) under the tongue every 5 (five) minutes as needed for chest pain (Please call your doctor/911 if no improvement after the second dose). 10/11/17   Plotnikov, Evie Lacks, MD  pantoprazole (PROTONIX) 40 MG tablet Take 1 tablet (40 mg total) by mouth daily. 06/13/18   Plotnikov, Evie Lacks, MD  polyethylene glycol (MIRALAX / GLYCOLAX) packet Take 17 g by mouth daily.    [provider]  simvastatin (ZOCOR) 80 MG tablet Take 1 tablet (80 mg total) by mouth daily. 01/04/19   Plotnikov, Evie Lacks, MD  tamsulosin (FLOMAX) 0.4 MG CAPS capsule Take 1 capsule (0.4 mg total) by mouth daily after breakfast. 08/05/18   Nita Sells, MD  vitamin C (ASCORBIC ACID) 500 MG tablet Take 500 mg by mouth daily.    [provider]    Physical Exam: Vitals:   02/17/19 1300 02/17/19 1345 02/17/19 1415 02/17/19 1430  BP: (!) 174/77 (!) 166/75 (!) 142/90 (!) 163/69  Pulse: (!) 58 (!) 55 (!) 51 (!) 54  Resp: 13 14 12 12   Temp:      SpO2: 98% 100% 100% 100%     . General:   Appears calm and comfortable and is NAD . Eyes:  PERRL, EOMI, normal lids, iris . ENT:  grossly normal hearing, lips & tongue, mmm; appropriate dentition . Neck:  no LAD, masses or thyromegaly . Cardiovascular:  RR with mild bradycardia, no m/r/g. No LE edema.  [name] Respiratory:   CTA bilaterally with no wheezes/rales/rhonchi.  Normal respiratory effort. . Abdomen:  soft, NT, ND, NABS . Skin:  no rash or induration seen on limited exam . Musculoskeletal:  grossly normal tone BUE/BLE, good ROM,  no bony abnormality . Psychiatric:  grossly normal mood and affect, speech fluent and appropriate, AOx2-3, confusion waxes and wanes . Neurologic:  CN 2-12 grossly intact, moves all extremities in coordinated fashion    Radiological Exams on Admission: DG Chest 2 View  Result Date: 02/17/2019 CLINICAL DATA:  Chest pain and slight shortness of breath. EXAM: CHEST - 2 VIEW COMPARISON:  August 02, 2018 FINDINGS: Cardiomediastinal silhouette is enlarged. Calcific atherosclerotic disease and tortuosity of the aorta. Mediastinal contours appear intact. There is no evidence of focal airspace consolidation, pleural effusion or pneumothorax. Increased elevation of the left hemidiaphragm. Osseous structures are without acute abnormality. Soft tissues are grossly normal. IMPRESSION: 1. Enlarged cardiac silhouette. 2. Calcific atherosclerotic disease and tortuosity of the aorta. 3. Increased elevation of the left hemidiaphragm. Electronically Signed   By: Fidela Salisbury M.D.   On: 02/17/2019 12:50    EKG: Independently reviewed.  Sinus bradycardia with rate 54; nonspecific ST changes with no evidence of STEMI   Labs on Admission: I have personally reviewed the available labs and imaging studies at the time of the admission.  Pertinent labs:   Glucose 114 Unremarkable BMP HS troponin 15, 16 Normal CBC COVID pending   Assessment/Plan Principal Problem:   Chest pain Active Problems:   Dyslipidemia    Essential hypertension   Chronic combined systolic and diastolic CHF (congestive heart failure) (HCC)   Dementia (HCC)   Chest pain with h/o CAD -Patient with substernal chest pressure that has come on intermittently for days to weeks; today's episode resolved with ASA/NTG. -1-2/3 typical symptoms suggestive of atypical/noncardiac chest pain.  -CXR unremarkable.   -Initial cardiac HS troponin negative.  -EKG not indicative of acute ischemia.   -HEART pathway score is 6, indicating that the patient has an elevated risk score and requires further evaluation. -Will plan to place in observation status on telemetry to rule out ACS by overnight observation.  -HS troponin 15, 16 - negative delta -Repeat EKG in AM -Continue ASA 81 mg daily -Cardiology consulted and recommends observation with stress test tomorrow -NPO after midnight for stress test  -Stress test tomorrow - if low risk, d/c to home; if high risk, cath Monday  HTN -He does not appear to be taking BP medication at this time  HLD -Change Zocor 80 mg daily to Lipitor 40 mg daily -LDL was 73 in 2019 -There does not appear to be significant utility in ongoing lipid monitoring  Chronic combined CHF -04/22/17 echo with EF 45-50% and grade 1 diastolic dysfunction -Appears to be compensated at this time  Dementia -Sensorium seems to wax and wane -He does not appear to be taking medications for this issue at this time  DNR -I have discussed code status with the patient's wife and she is in agreement that the patient would not desire resuscitation and would prefer to die a natural death should that situation arise.    Note: This patient has been tested and is pending for the novel coronavirus COVID-19.   DVT prophylaxis: Lovenox  Code Status:  DNR - confirmed with wife Family Communication: None present; Cardiology spoke with his wife at the time of admission and I spoke with her while I was in the room, as well Disposition  Plan:  Home once clinically improved Consults called: Cardiology  Admission status: It is my clinical opinion that referral for OBSERVATION is reasonable and necessary in this patient based on the above information provided. The aforementioned taken together are felt to place  the patient at high risk for further clinical deterioration. However it is anticipated that the patient may be medically stable for discharge from the hospital within 24 to 48 hours.      Karmen Bongo MD [facility]   How to contact the [facility] Attending or Consulting provider [place] or covering provider during after hours [place], for this patient?  1. Check the care team in [facility] and look for a) attending/consulting [facility] provider listed and b) the [facility] team listed 2. Log into [email or web address] and use [place]'s universal password to access. If you do not have the password, please contact the hospital operator. 3. Locate the [facility] provider you are looking for under [facility] and page to a number that you can be directly reached. 4. If you still have difficulty reaching the provider, please page the [facility] (Director on Call) for the Hospitalists listed on amion for assistance.   02/17/2019, 4:43 PM

## 2019-02-17 NOTE — ED Provider Notes (Signed)
[facility] EMERGENCY DEPARTMENT Provider Note   CSN: [id number] Arrival date & time: 02/17/19  1112     History No chief complaint on file.   [name] is a 86 y.o. male.  HPI  Patient is a 86 year old male has a history of memory loss, HLD, HTN, CAD w/ BMS circumflex 2002-unstable angina 2007+ DES, HFrEF 45-50% 04/22/2017 --on home nitro, aspirin 81 and statin.   Patient presents today for sternal chest pain that he describes as moderate in severity, achy in quality that he states "hit him in the back" today. He states that the pain lasted until he was nearly to the hospital. He was given aspirin and nitroglycerin by EMS in route. States that he did not feel better immediately after these medications were given but does state that he felt better soon afterwards. Patient is unable to state whether this pain is exertional or not. Does say that he is completely asymptomatic at this time with no shortness of breath or chest pain.  States that he is uncertain if he has had blood clots in the past. On my review of EMR he has had CT angio for dissection done 7/20 and 3/19 with no dissection or PE.   HPI: A 86 year old patient with a history of hypertension and hypercholesterolemia presents for evaluation of chest pain. Initial onset of pain was approximately 1-3 hours ago. The patient's chest pain is described as heaviness/pressure/tightness, is not worse with exertion and is relieved by nitroglycerin. The patient's chest pain is not middle- or left-sided, is not well-localized, is not sharp and does not radiate to the arms/jaw/neck. The patient does not complain of nausea and denies diaphoresis. The patient has no history of stroke, has no history of peripheral artery disease, has not smoked in the past 90 days, denies any history of treated diabetes, has no relevant family history of coronary artery disease (first degree relative at less than age 65) and does not have an  elevated BMI (>=30).   Past Medical History:  Diagnosis Date  . Coronary atherosclerosis of unspecified type of vessel, native or graft   . Dizziness and giddiness   . Hypertrophy of prostate without urinary obstruction and other lower urinary tract symptoms (LUTS)   . MI (myocardial infarction) ([place]) 2002  . Osteoarthrosis, unspecified whether generalized or localized, unspecified site   . Other and unspecified hyperlipidemia   . Shortness of breath dyspnea    with exertion  . Skin cancer    "top of his head"  . Thrombocytopenia, unspecified ([place])   . Unspecified essential hypertension     Patient Active Problem List   Diagnosis Date Noted  . Abdominal pain, acute, epigastric 08/02/2018  . Hypertensive urgency 08/02/2018  . Hyperbilirubinemia 08/02/2018  . Hypertension   . Abdominal pain 07/13/2018  . Bradycardia 07/13/2018  . Orthostatic dizziness 12/15/2017  . Wrist pain, acute, right 10/14/2017  . Seborrheic keratoses 06/01/2017  . Chest pain 04/22/2017  . Fall 04/22/2017  . Gait disorder 11/03/2016  . Nausea 10/12/2016  . Hearing loss 11/25/2015  . Wart viral 04/28/2015  . 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 Situational depression 08/07/2013  . Well adult exam 08/02/2012  . Nausea with vomiting 03/28/2012  . HYPERGLYCEMIA 02/23/2010  . HYPERKALEMIA 10/21/2009  . Actinic keratosis 10/21/2009  . 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 PSA, INCREASED 04/22/2009  . MELANOMA, SCALP 01/02/2009  . NEOPLASM, SKIN, UNCERTAIN BEHAVIOR [id number]  . CAROTID ARTERY STENOSIS, WITHOUT INFARCTION 05/06/2008  .  Congestive heart failure ([place]) 04/29/2008  . Chronic combined systolic and diastolic CHF (congestive heart failure) ([place]) 04/29/2008  . Osteoarthritis 04/29/2008  . VERTIGO 03/11/2008  . Dyslipidemia 04/14/2007  . Essential hypertension 04/14/2007  . Coronary atherosclerosis 04/14/2007  . BPH (benign prostatic hyperplasia) 04/14/2007    Past  Surgical History:  Procedure Laterality Date  . COLONOSCOPY    . CORONARY ANGIOPLASTY  01/07/2001, 2008   2 stents  . ESOPHAGOGASTRODUODENOSCOPY N/A 04/22/2014   Procedure: ESOPHAGOGASTRODUODENOSCOPY (EGD);  Surgeon: Milus Banister, MD;  Location: [facility];  Service: Endoscopy;  Laterality: N/A;  . HIP ARTHROPLASTY Right 2006  . JOINT REPLACEMENT    . MOHS SURGERY     "top of his head"  . TRANSURETHRAL RESECTION OF PROSTATE         Family History  Problem Relation Age of Onset  . Heart disease Father   . Coronary artery disease Other   . Colon cancer Neg Hx     Social History   Tobacco Use  . Smoking status: Never Smoker  . Smokeless tobacco: Never Used  Substance Use Topics  . Alcohol use: No    Alcohol/week: 0.0 standard drinks  . Drug use: No    Home Medications Prior to Admission medications   Medication Sig Start Date End Date Taking? Authorizing Provider  aspirin 81 MG EC tablet Take 81 mg by mouth daily.      [provider]  b complex vitamins tablet Take 1 tablet by mouth daily.      [provider]  Cholecalciferol 1000 UNITS tablet Take 1,000 Units by mouth daily.      [provider]  finasteride (PROSCAR) 5 MG tablet Take 1 tablet (5 mg total) by mouth daily. 11/03/18   Plotnikov, Evie Lacks, MD  Glucosamine-Chondroit-Vit C-Mn (GLUCOSAMINE 1500 COMPLEX PO) Take 1 tablet by mouth 2 (two) times daily.     [provider]  nitroGLYCERIN (NITROSTAT) 0.4 MG SL tablet Place 1 tablet (0.4 mg total) under the tongue every 5 (five) minutes as needed for chest pain (Please call your doctor/911 if no improvement after the second dose). 10/11/17   Plotnikov, Evie Lacks, MD  pantoprazole (PROTONIX) 40 MG tablet Take 1 tablet (40 mg total) by mouth daily. 06/13/18   Plotnikov, Evie Lacks, MD  polyethylene glycol (MIRALAX / GLYCOLAX) packet Take 17 g by mouth daily.    [provider]  simvastatin (ZOCOR) 80 MG tablet Take 1 tablet  (80 mg total) by mouth daily. 01/04/19   Plotnikov, Evie Lacks, MD  tamsulosin (FLOMAX) 0.4 MG CAPS capsule Take 1 capsule (0.4 mg total) by mouth daily after breakfast. 08/05/18   Nita Sells, MD  vitamin C (ASCORBIC ACID) 500 MG tablet Take 500 mg by mouth daily.    [provider]    Allergies    Oxycodone and Penicillins  Review of Systems   Review of Systems  Constitutional: Negative for chills and fever.  HENT: Negative for congestion.   Eyes: Negative for pain.  Respiratory: Positive for shortness of breath. Negative for cough.   Cardiovascular: Positive for chest pain. Negative for leg swelling.  Gastrointestinal: Negative for abdominal pain and vomiting.  Genitourinary: Negative for dysuria.  Musculoskeletal: Positive for back pain (Between shoulder blades). Negative for myalgias.  Skin: Negative for rash.  Neurological: Negative for dizziness and headaches.    Physical Exam Updated Vital Signs BP (!) 163/69   Pulse (!) 54   Temp (!) 97.4 F (36.3  C)   Resp 12   SpO2 100%   Physical Exam Vitals and nursing note reviewed.  Constitutional:      General: He is not in acute distress.    Appearance: He is not ill-appearing.     Comments: 86 year old male appears stated age appears frail but toxic or diaphoretic.  Is in no acute distress sitting comfortably in bed answers questions appropriately follows commands  HENT:     Head: Normocephalic and atraumatic.     Nose: Nose normal.     Mouth/Throat:     Mouth: Mucous membranes are moist.  Eyes:     General: No scleral icterus. Cardiovascular:     Rate and Rhythm: Normal rate and regular rhythm.     Pulses: Normal pulses.     Heart sounds: Normal heart sounds.  Pulmonary:     Effort: Pulmonary effort is normal. No respiratory distress.     Breath sounds: No wheezing or rales.     Comments: No increased work of breathing.  Speaking full sentences. Chest:     Chest wall: No tenderness.  Abdominal:      General: There is no distension.     Palpations: Abdomen is soft. There is no mass.     Tenderness: There is no abdominal tenderness. There is no right CVA tenderness, left CVA tenderness, guarding or rebound.     Hernia: No hernia is present.  Musculoskeletal:     Cervical back: Normal range of motion and neck supple. No tenderness.     Right lower leg: No edema.     Left lower leg: No edema.     Comments: Left calf is significantly thinner with less muscular bulk than the right calf--this is unchanged from patient's baseline per patient No calf tenderness.  No swelling or edema BLE  Skin:    General: Skin is warm and dry.     Capillary Refill: Capillary refill takes less than 2 seconds.     Comments: Numerous discolored skin lesions consistent with AK and melanoma seborrheic keratosis diffusely on all limbs and on trunk and face/head.   Neurological:     Mental Status: He is alert. Mental status is at baseline.  Psychiatric:        Mood and Affect: Mood normal.        Behavior: Behavior normal.     ED Results / Procedures / Treatments   Labs (all labs ordered are listed, but only abnormal results are displayed) Labs Reviewed  BASIC METABOLIC PANEL - Abnormal; Notable for the following components:      Result Value   Glucose, Bld 114 (*)    GFR calc non Af Amer 56 (*)    All other components within normal limits  SARS CORONAVIRUS 2 (TAT 6-24 HRS)  CBC  TROPONIN I (HIGH SENSITIVITY)  TROPONIN I (HIGH SENSITIVITY)    EKG EKG Interpretation  Date/Time:  Saturday February 17 2019 11:29:26 EST Ventricular Rate:  54 PR Interval:    QRS Duration: 113 QT Interval:  465 QTC Calculation: 441 R Axis:   -50 Text Interpretation: Sinus rhythm LAD, consider left anterior fascicular block Posterior infarct, old Probable lateral infarct, old When compard to prior, more artifact.  possible t wave inversion in lead V6. No STEMI Confirmed by Antony Blackbird [phone]) on 02/17/2019 11:45:59  AM   Radiology DG Chest 2 View  Result Date: 02/17/2019 CLINICAL DATA:  Chest pain and slight shortness of breath. EXAM: CHEST - 2 VIEW COMPARISON:  August 02, 2018 FINDINGS: Cardiomediastinal silhouette is enlarged. Calcific atherosclerotic disease and tortuosity of the aorta. Mediastinal contours appear intact. There is no evidence of focal airspace consolidation, pleural effusion or pneumothorax. Increased elevation of the left hemidiaphragm. Osseous structures are without acute abnormality. Soft tissues are grossly normal. IMPRESSION: 1. Enlarged cardiac silhouette. 2. Calcific atherosclerotic disease and tortuosity of the aorta. 3. Increased elevation of the left hemidiaphragm. Electronically Signed   By: Fidela Salisbury M.D.   On: 02/17/2019 12:50    Procedures Procedures (including critical care time)  Medications Ordered in ED Medications  sodium chloride flush (NS) 0.9 % injection 3 mL (has no administration in time range)    ED Course  I have reviewed the triage vital signs and the nursing notes.  Pertinent labs & imaging results that were available during my care of the patient were reviewed by me and considered in my medical decision making (see chart for details).  Clinical Course as of Feb 16 1617  Sat Feb 17, 2019  1527 Troponin I (High Sensitivity) [WF]  [phone] Discussed with Dr. Sallyanne Kuster of cardiology to ask whether patient would be appropriate for cardiology admission or hospitalist with tele admit--Dr. Croitoru recommends the latter. Will consult hospitalist for admit.    [WF]  1554 Discussed with patient and wife over the phone the patient will be admitted.   [WF]  1616 Discussed with Dr. Lorin Mercy who will admit patient to hospital.   [WF]    Clinical Course User Index [WF] Tedd Sias, PA   MDM Rules/Calculators/A&P HEAR Score: 6                    Patient is 86 year old male with significant past medical history including coronary artery disease with  stents placed in 2 different times.  CHF with EF 45%, HTN, HLD who presents today with chest pain began at 8 AM and improved with nitroglycerin administered by EMS and aspirin.  Is symptom free however has not had a cardiac cath done recently I can see.  I independently reviewed the EMR.   Troponins flat, CBC and CMP unremarkable.  Covid test ordered and pending  I discussed this case with my attending physician who cosigned this note including patient's presenting symptoms, physical exam, and planned diagnostics and interventions. Attending physician stated agreement with plan or made changes to plan which were implemented.   Attending physician assessed patient at bedside.  Will discuss with cardiology   Cardiology recommended admit to hospitalist.   Final Clinical Impression(s) / ED Diagnoses Final diagnoses:  Chest pain, unspecified type  SOB (shortness of breath)    Rx / DC Orders ED Discharge Orders    None       Tedd Sias, [place] 02/17/19 1619    Tegeler, Gwenyth Allegra, MD 02/17/19 [phone]

## 2019-02-17 NOTE — ED Notes (Signed)
Patient transported to X-ray 

## 2019-02-17 NOTE — ED Triage Notes (Signed)
Pt here from [facility] with c/o chest pain , with some slight sob , no n/v , pt took 324mg  asa and 2 nitro , pt pain free on arrival

## 2019-02-18 ENCOUNTER — Observation Stay (HOSPITAL_BASED_OUTPATIENT_CLINIC_OR_DEPARTMENT_OTHER): Payer: Medicare Other

## 2019-02-18 DIAGNOSIS — R079 Chest pain, unspecified: Secondary | ICD-10-CM

## 2019-02-18 DIAGNOSIS — I5042 Chronic combined systolic (congestive) and diastolic (congestive) heart failure: Secondary | ICD-10-CM | POA: Diagnosis not present

## 2019-02-18 DIAGNOSIS — Z85828 Personal history of other malignant neoplasm of skin: Secondary | ICD-10-CM | POA: Diagnosis not present

## 2019-02-18 DIAGNOSIS — I2510 Atherosclerotic heart disease of native coronary artery without angina pectoris: Secondary | ICD-10-CM

## 2019-02-18 DIAGNOSIS — I10 Essential (primary) hypertension: Secondary | ICD-10-CM | POA: Diagnosis not present

## 2019-02-18 DIAGNOSIS — Z20822 Contact with and (suspected) exposure to covid-19: Secondary | ICD-10-CM | POA: Diagnosis not present

## 2019-02-18 DIAGNOSIS — Z7982 Long term (current) use of aspirin: Secondary | ICD-10-CM | POA: Diagnosis not present

## 2019-02-18 DIAGNOSIS — E785 Hyperlipidemia, unspecified: Secondary | ICD-10-CM | POA: Diagnosis not present

## 2019-02-18 DIAGNOSIS — F0390 Unspecified dementia without behavioral disturbance: Secondary | ICD-10-CM | POA: Diagnosis not present

## 2019-02-18 DIAGNOSIS — Z79899 Other long term (current) drug therapy: Secondary | ICD-10-CM | POA: Diagnosis not present

## 2019-02-18 DIAGNOSIS — I110 Hypertensive heart disease with heart failure: Secondary | ICD-10-CM | POA: Diagnosis not present

## 2019-02-18 LAB — NM MYOCAR MULTI W/SPECT W/WALL MOTION / EF
Estimated workload: 1 METS
Exercise duration (min): 0 min
Exercise duration (sec): 0 s
MPHR: 127 {beats}/min
Peak HR: 81 {beats}/min
Percent HR: 63 %
Rest HR: 62 {beats}/min

## 2019-02-18 MED ORDER — POLYETHYLENE GLYCOL 3350 17 G PO PACK: 17.0000 g | PACK | Freq: Every day | ORAL | 0 refills | Status: DC

## 2019-02-18 MED ORDER — TECHNETIUM TC 99M TETROFOSMIN IV KIT
32.1000 | PACK | Freq: Once | INTRAVENOUS | Status: AC | PRN
  Administered 2019-02-18: 10:00:00 32.1 via INTRAVENOUS

## 2019-02-18 MED ORDER — ISOSORBIDE MONONITRATE ER 30 MG PO TB24: 30.0000 mg | ORAL_TABLET | Freq: Every day | ORAL | Status: DC

## 2019-02-18 MED ORDER — REGADENOSON 0.4 MG/5ML IV SOLN
INTRAVENOUS | Status: AC
  Filled 2019-02-18: qty 5

## 2019-02-18 MED ORDER — TECHNETIUM TC 99M TETROFOSMIN IV KIT
10.8200 | PACK | Freq: Once | INTRAVENOUS | Status: AC | PRN
  Administered 2019-02-18: 09:00:00 10.82 via INTRAVENOUS

## 2019-02-18 MED ORDER — ISOSORBIDE MONONITRATE ER 30 MG PO TB24: 30.0000 mg | ORAL_TABLET | Freq: Every day | ORAL | 0 refills | Status: DC

## 2019-02-18 MED ORDER — REGADENOSON 0.4 MG/5ML IV SOLN
0.4000 mg | Freq: Once | INTRAVENOUS | Status: AC
  Administered 2019-02-18: 10:00:00 0.4 mg via INTRAVENOUS

## 2019-02-18 NOTE — Progress Notes (Addendum)
Progress Note  Patient Name: [name] Date of Encounter: 02/18/2019  Primary Cardiologist: Lauree Chandler, MD   Subjective   He denies any recurrent chest pain overnight. Evaluated in Nuclear Medicine at time of stress test.   Inpatient Medications    Scheduled Meds: . aspirin EC  81 mg Oral Daily  . atorvastatin  40 mg Oral q1800  . enoxaparin (LOVENOX) injection  40 mg Subcutaneous Q24H  . finasteride  5 mg Oral Daily  . pantoprazole  40 mg Oral Daily  . polyethylene glycol  17 g Oral Daily  . regadenoson      . tamsulosin  0.4 mg Oral QPC breakfast   Continuous Infusions:  PRN Meds: acetaminophen, ondansetron (ZOFRAN) IV   Vital Signs    Vitals:   02/18/19 0929 02/18/19 0936 02/18/19 0938 02/18/19 0940  BP: 139/89 (!) 159/82 (!) 152/75 (!) 157/76  Pulse: 63 71 74 72  Resp:      Temp:      TempSrc:      SpO2:      Weight:        Intake/Output Summary (Last 24 hours) at 02/18/2019 0958 Last data filed at 02/17/2019 2200 Gross per 24 hour  Intake -  Output 500 ml  Net -500 ml    Last 3 Weights 02/18/2019 11/27/2018 08/04/2018  Weight (lbs) 134 lb 6.4 oz 138 lb 139 lb 8.8 oz  Weight (kg) 60.963 kg 62.596 kg 63.3 kg      Telemetry    Not reviewed. In [place] [facility].   ECG    Sinus bradycardia, HR 56 with no acute ST abnormalities.  - Personally Reviewed  Physical Exam   General: Well developed, elderly male appearing in no acute distress. Head: Normocephalic, atraumatic.  Neck: Supple without bruits, JVD not elevated. Lungs:  Resp regular and unlabored, CTA without wheezing or rales. Heart: RRR with occasional ectopic beats, S1, S2, no S3, S4, or murmur; no rub. Abdomen: Soft, non-tender, non-distended with normoactive bowel sounds. No hepatomegaly. No rebound/guarding. No obvious abdominal masses. Extremities: No clubbing, cyanosis, or lower extremity edema. Distal pedal pulses are 2+ bilaterally. Neuro: Alert and oriented X 3. Moves all  extremities spontaneously. Psych: Normal affect.  Labs    Chemistry Recent Labs  Lab 02/17/19 1235  NA 139  K 4.2  CL 104  CO2 23  GLUCOSE 114*  BUN 17  CREATININE 1.13  CALCIUM 9.4  GFRNONAA 56*  GFRAA >60  ANIONGAP 12     Hematology Recent Labs  Lab 02/17/19 1235  WBC 7.5  RBC 4.50  HGB 13.9  HCT 42.5  MCV 94.4  MCH 30.9  MCHC 32.7  RDW 14.3  PLT 191    Cardiac EnzymesNo results for input(s): TROPONINI in the last 168 hours. No results for input(s): TROPIPOC in the last 168 hours.   BNPNo results for input(s): BNP, PROBNP in the last 168 hours.   DDimer No results for input(s): DDIMER in the last 168 hours.   Radiology    DG Chest 2 View  Result Date: 02/17/2019 CLINICAL DATA:  Chest pain and slight shortness of breath. EXAM: CHEST - 2 VIEW COMPARISON:  August 02, 2018 FINDINGS: Cardiomediastinal silhouette is enlarged. Calcific atherosclerotic disease and tortuosity of the aorta. Mediastinal contours appear intact. There is no evidence of focal airspace consolidation, pleural effusion or pneumothorax. Increased elevation of the left hemidiaphragm. Osseous structures are without acute abnormality. Soft tissues are grossly normal. IMPRESSION: 1. Enlarged cardiac silhouette.  2. Calcific atherosclerotic disease and tortuosity of the aorta. 3. Increased elevation of the left hemidiaphragm. Electronically Signed   By: Fidela Salisbury M.D.   On: 02/17/2019 12:50    Cardiac Studies   Echocardiogram: 04/2017 Study Conclusions  - Left ventricle: The cavity size was normal. Wall thickness was   increased in a pattern of mild LVH. Systolic function was mildly   reduced. The estimated ejection fraction was in the range of 45%   to 50%. There is akinesis of the basal-midinferoseptal   myocardium. Doppler parameters are consistent with abnormal left   ventricular relaxation (grade 1 diastolic dysfunction). - Aortic valve: Trileaflet; mildly thickened, mildly  calcified   leaflets. There was mild regurgitation. - Mitral valve: There was mild regurgitation. - Left atrium: The atrium was mildly dilated. - Pulmonary arteries: Systolic pressure was mildly increased. PA   peak pressure: 48 mm Hg (S).   Patient Profile     86 y.o. male w/ PMH of CAD (s/p BMS to LCx in 2002, DES to mid-LAD in 2007), ischemic cardiomyopathy (EF 45-50% by echo in 04/2017), HTN, HLD, mild AI, mild MR and dementia who presented to [name] on 02/17/2019 for evaluation of chest pain.   Assessment & Plan    1. Chest Pain with Mixed Typical and Atypical Features - He presents with a 3 to 4-week history of episodes of chest discomfort which have typically occurred at rest and spontaneously resolve. Responsive to SL NTG the day of admission.  -  HS Troponin values have been negative this admission and EKG without acute changes.  - undergoing Lexiscan Myoview this AM. Was asymptomatic with Lexiscan. Official report pending following stress images. If low-risk, good for discharge from a cardiac perspective later today. If significant ischemia, would need to review with the patient and his wife about cardiac catheterization.   2. CAD - s/p BMS to LCx in 2002 and DES to mid-LAD in 2007. NST performed this AM with results pending.  - remains on ASA and statin therapy. No BB given prior bradycardia with low-dose Coreg and HR has been in the 50's to 60's this admission.   3. Ischemic Cardiomyopathy - EF 45-50% by echo in 04/2017. No recent orthopnea, PND or edema and he appears euvolemic on examination. Not on beta-blocker therapy as an outpatient due to baseline bradycardia.    4. Valvular Heart Disease - mild MR and mild AI by prior echocardiogram in 2019 as outlined above. Continue to follow as an outpatient.   5. Dementia - remains A&Ox3. Wife updated on plan of care yesterday. Will follow-up once stress test results are available.    For questions or updates, please  contact [place] Please consult [email or web address] for contact info under Cardiology/STEMI.   Arna Medici , PA-C 9:58 AM 02/18/2019 Pager: [phone]   I have seen and examined the patient along with Erma Heritage , PA-C.  I have reviewed the chart, notes and new data.  I agree with PA/NP's note.  Key new complaints: no further chest discomfort Key examination changes: normal CV exam Key new findings / data: reviewed nuclear images. Old scar without ischemia. EF 44%.  PLAN: No evidence of new myocardium in jeopardy. Low risk ECG and enzymes and expected scar on nuclear scan.  DC home, no plan for invasive evaluation. Will start isosorbide mononitrate 30 mg daily to see if this alleviates his complaints. Unable to give beta blockers due to bradycardia. Continue statin and antiplatelet  therapy.  Sanda Klein, MD, [place] [phone] 02/18/2019, 12:16 PM

## 2019-02-18 NOTE — Discharge Summary (Signed)
[name] [id number] DOB: Jun 02, 1923 DOA: 02/17/2019  PCP: Cassandria Anger, MD  Admit date: 02/17/2019  Discharge date: 02/18/2019  Admitted From: [facility] Disposition: [place]    Recommendations for Outpatient Follow-up:   Follow up with PCP in 1-2 weeks   Home Health: Discharge to assisted living Equipment/Devices: N/A Consultations: Cardiology Discharge Condition: Stable CODE STATUS: DNR Diet Recommendation: Heart Healthy low-sodium  Diet Order            Diet Heart Room service appropriate? Yes; Fluid consistency: Thin  Diet effective now        Diet - low sodium heart healthy               No chief complaint on file.    Brief history of present illness from the day of admission and additional interim summary    [name] is a 86 y.o. male with medical history significant of HTN; HLD; chronic combined CHF; CAD s/p stent; dementia; and BPH presenting with chest pain.   He has mild dementia and is able to answer most questions.  He reports episodic intermittent chest pain over the last month.  He hasn't wanted to tell his wife and so would beat himself on the chest when she wasn't looking.  It happened again today and he finally told her and she sent him in for evaluation.  The CP resolved with ASA/NTG and has not recurred.  Cardiology was consulted and given elevated heart score they recommended observation with Lexiscan Myoview stress test in the morning.                                                                  Hospital Course   Patient did well overnight with no further episodes of chest pain.  At bedtime troponins remained low at 15 and 16.  EKG without any acute ST-T wave changes.  Patient underwent a Lexiscan Myoview this morning which showed no  reversible ischemia. He did have a large scar involving the mid and basilar segments of the lateral wall and inferolateral wall.   This was reviewed with Dr. Sallyanne Kuster and no plans for further cardiac testing at this time.  Patient was started on Imdur 30mg  daily. No other medication changes were made.  Discharge diagnosis     Principal Problem:   Chest pain Active Problems:   Dyslipidemia   Essential hypertension   Chronic combined systolic and diastolic CHF (congestive heart failure) (HCC)   Dementia (HCC)    Discharge instructions    Discharge Instructions    Diet - low sodium heart healthy   Complete by: As directed    Discharge instructions   Complete by: As directed    1.  Your heart test came back negative for any blood flow  abnormalities in the heart. 2.  Please continue to take your previous medications without change. 3.  The only new medication is called Imdur.  You will take 1 tablet every morning. 4.  Follow up with your PCP in 1-2 weeks.   Increase activity slowly   Complete by: As directed       Discharge Medications   Allergies as of 02/18/2019      Reactions   Oxycodone Nausea And Vomiting   Penicillins Hives   Did it involve swelling of the face/tongue/throat, SOB, or low BP? Unknown Did it involve sudden or severe rash/hives, skin peeling, or any reaction on the inside of your mouth or nose? Yes Did you need to seek medical attention at a hospital or doctor's office? Unknown When did it last happen?86 yrs old If all above answers are "NO", may proceed with cephalosporin use.      Medication List    TAKE these medications   aspirin 81 MG EC tablet Take 81 mg by mouth daily.   b complex vitamins tablet Take 1 tablet by mouth daily.   Cholecalciferol 25 MCG (1000 UT) tablet Take 1,000 Units by mouth daily.   finasteride 5 MG tablet Commonly known as: Proscar Take 1 tablet (5 mg total) by mouth daily.   GLUCOSAMINE 1500 COMPLEX PO Take 1  tablet by mouth 2 (two) times daily.   isosorbide mononitrate 30 MG 24 hr tablet Commonly known as: IMDUR Take 1 tablet (30 mg total) by mouth daily. Start taking on: February 19, 2019   nitroGLYCERIN 0.4 MG SL tablet Commonly known as: NITROSTAT Place 1 tablet (0.4 mg total) under the tongue every 5 (five) minutes as needed for chest pain (Please call your doctor/911 if no improvement after the second dose).   pantoprazole 40 MG tablet Commonly known as: PROTONIX Take 1 tablet (40 mg total) by mouth daily. What changed: when to take this   polyethylene glycol 17 g packet Commonly known as: MIRALAX / GLYCOLAX Take 17 g by mouth daily.   simvastatin 80 MG tablet Commonly known as: ZOCOR Take 1 tablet (80 mg total) by mouth daily. What changed: when to take this   STOOL SOFTENER PO Take 1 capsule by mouth at bedtime.   tamsulosin 0.4 MG Caps capsule Commonly known as: FLOMAX Take 1 capsule (0.4 mg total) by mouth daily after breakfast. What changed: when to take this   vitamin C 500 MG tablet Commonly known as: ASCORBIC ACID Take 500 mg by mouth daily.       Follow-up Information    Burnell Blanks, MD Follow up.   Specialty: Cardiology Why: The office should call you within 2-3 business days to arrange follow-up. If you do not hear from them, please call the number provided.  Contact information: [place] [address] [place] [address] [phone]           Major procedures and Radiology Reports - PLEASE review detailed and final reports thoroughly  -        DG Chest 2 View  Result Date: 02/17/2019 CLINICAL DATA:  Chest pain and slight shortness of breath. EXAM: CHEST - 2 VIEW COMPARISON:  August 02, 2018 FINDINGS: Cardiomediastinal silhouette is enlarged. Calcific atherosclerotic disease and tortuosity of the aorta. Mediastinal contours appear intact. There is no evidence of focal airspace consolidation, pleural effusion or pneumothorax.  Increased elevation of the left hemidiaphragm. Osseous structures are without acute abnormality. Soft tissues are grossly normal. IMPRESSION: 1.  Enlarged cardiac silhouette. 2. Calcific atherosclerotic disease and tortuosity of the aorta. 3. Increased elevation of the left hemidiaphragm. Electronically Signed   By: Fidela Salisbury M.D.   On: 02/17/2019 12:50   NM Myocar Multi W/Spect W/Wall Motion / EF  Result Date: 02/18/2019 CLINICAL DATA:  86 year old male with chest pain EXAM: MYOCARDIAL IMAGING WITH SPECT (REST AND PHARMACOLOGIC-STRESS) GATED LEFT VENTRICULAR WALL MOTION STUDY LEFT VENTRICULAR EJECTION FRACTION TECHNIQUE: Standard myocardial SPECT imaging was performed after resting intravenous injection of 10 mCi Tc-72m tetrofosmin. Subsequently, intravenous infusion of Lexiscan was performed under the supervision of the Cardiology staff. At peak effect of the drug, 30 mCi Tc-24m tetrofosmin was injected intravenously and standard myocardial SPECT imaging was performed. Quantitative gated imaging was also performed to evaluate left ventricular wall motion, and estimate left ventricular ejection fraction. COMPARISON:  None. FINDINGS: Perfusion: Decreased perfusion to the mid and basilar segment of the lateral wall which is fixed on rest and stress. Decreased perfusion to the inferolateral wall which is fixed on rest and stress involving the apical mid and basilar segment. Wall Motion: Global hypokinesia Left Ventricular Ejection Fraction: 44 % End diastolic volume [id number] ml End systolic volume 57 ml IMPRESSION: 1. No reversible ischemia. Large scar involving the mid and basilar segments of the lateral wall and inferolateral wall. 2. Global hypokinesia. 3. Left ventricular ejection fraction 44% 4. Non invasive risk stratification*: Intermediate *2012 Appropriate Use Criteria for Coronary Revascularization Focused Update: J Am Coll Cardiol. [id number].  http://content.airportbarriers.com.aspx?articleid=[email or web address] Electronically Signed   By: Suzy Bouchard M.D.   On: 02/18/2019 11:57    Micro Results    Recent Results (from the past 240 hour(s))  SARS CORONAVIRUS 2 (TAT 6-24 HRS) Nasopharyngeal Nasopharyngeal Swab     Status: None   Collection Time: 02/17/19 12:56 PM   Specimen: Nasopharyngeal Swab  Result Value Ref Range Status   SARS Coronavirus 2 NEGATIVE NEGATIVE Final    Comment: (NOTE) SARS-CoV-2 target nucleic acids are NOT DETECTED. The SARS-CoV-2 RNA is generally detectable in upper and lower respiratory specimens during the acute phase of infection. Negative results do not preclude SARS-CoV-2 infection, do not rule out co-infections with other pathogens, and should not be used as the sole basis for treatment or other patient management decisions. Negative results must be combined with clinical observations, patient history, and epidemiological information. The expected result is Negative. Fact Sheet for Patients: SugarRoll.be Fact Sheet for Healthcare Providers: https://www.woods-mathews.com/ This test is not yet approved or cleared by the [place] FDA and  has been authorized for detection and/or diagnosis of SARS-CoV-2 by FDA under an Emergency Use Authorization (EUA). This EUA will remain  in effect (meaning this test can be used) for the duration of the COVID-19 declaration under Section 56 4(b)(1) of the Act, 21 U.S.C. section 360bbb-3(b)(1), unless the authorization is terminated or revoked sooner. Performed at [facility], [place] [address] [place], [place] [address]     Today   Subjective    [name] today states he would like to go home.  He has not had any further chest pain.  He is confused about how he got here and what floor he is on.  He is looking forward to seeing his wife again.  Objective   Blood pressure (!) 158/73, pulse (!) 57,  temperature 98.1 F (36.7 C), temperature source Oral, resp. rate 20, weight 61 kg, SpO2 98 %.   Intake/Output Summary (Last 24 hours) at 02/18/2019 1413 Last data filed at 02/17/2019  2200 Gross per 24 hour  Intake --  Output 500 ml  Net -500 ml    Exam Awake Alert, Oriented x 2, friendly talkative with clear memory deficits. Icteric, conjunctiva not injected. Symmetrical Chest wall movement, Good air movement bilaterally, CTAB RRR,No Gallops,Rubs or new Murmurs, No Parasternal Heave +ve B.Sounds, Abd Soft, Non tender, No organomegaly appriciated, No rebound -guarding or rigidity. No Cyanosis, Clubbing or edema, No new Rash or bruise   Data Review   CBC w Diff:  Lab Results  Component Value Date   WBC 7.5 02/17/2019   HGB 13.9 02/17/2019   HCT 42.5 02/17/2019   PLT 191 02/17/2019   LYMPHOPCT 14 08/03/2018   MONOPCT 11 08/03/2018   EOSPCT 0 08/03/2018   BASOPCT 0 08/03/2018    CMP:  Lab Results  Component Value Date   NA 139 02/17/2019   K 4.2 02/17/2019   CL 104 02/17/2019   CO2 23 02/17/2019   BUN 17 02/17/2019   CREATININE 1.13 02/17/2019   PROT 7.3 11/27/2018   ALBUMIN 3.8 11/27/2018   BILITOT 0.7 11/27/2018   ALKPHOS 59 11/27/2018   AST 20 11/27/2018   ALT 14 11/27/2018  .   Total Time in preparing paper work, data evaluation and todays exam - 35 minutes  Vashti Hey M.D on 02/18/2019 at 2:13 PM  [facility]   Office  [phone]

## 2019-02-18 NOTE — Progress Notes (Signed)
    Patient's stress test showed no reversible ischemia. He did have a large scar involving the mid and basilar segments of the lateral wall and inferolateral wall. Reviewed with Dr. Sallyanne Kuster and no plans for further cardiac testing at this time. Will start Imdur 30mg  daily. Will send a staff message to the office to arrange for follow-up in 3-4 weeks. Called and informed patient's wife of stress test results.  Signed, Erma Heritage, PA-C 02/18/2019, 12:21 PM Pager: [phone]

## 2019-02-18 NOTE — Care Management (Signed)
Spoke with patient's wife.  Patient lives in independent living apartment with her.  His granddaughter can pick him to transport home.  No further CM needs.

## 2019-02-19 ENCOUNTER — Telehealth: Payer: Self-pay | Admitting: *Deleted

## 2019-02-19 ENCOUNTER — Telehealth: Payer: Self-pay | Admitting: Cardiovascular Disease

## 2019-02-19 ENCOUNTER — Other Ambulatory Visit: Payer: Self-pay | Admitting: Internal Medicine

## 2019-02-19 MED ORDER — NITROGLYCERIN 0.4 MG SL SUBL: 0.4000 mg | SUBLINGUAL_TABLET | SUBLINGUAL | 0 refills | Status: AC | PRN

## 2019-02-19 NOTE — Telephone Encounter (Signed)
-----   Message from Erma Heritage, [place] sent at 02/18/2019 12:24 PM EST ----- Regarding: Hospital Follow-up Please schedule this patient for a follow-up appointment and call them with that information.  Primary Cardiologist: Dr. Angelena Form Date of Discharge: 02/18/2019 Appointment Needed Within: Hospital Follow-up with Dr. Angelena Form or APP on his team in 3-4 weeks.   Thank you! [place] M. Ahmed Prima,  PA-C

## 2019-02-19 NOTE — Telephone Encounter (Signed)
Pt was on TCM report present to ER with chest pain. Pt admitted 02/17/19 for observation. Pt had EKG which showed without any acute ST-T wave changes.  Patient underwent a Lexiscan Myoview which showed noreversible ischemia. He did have a large scar involving the mid and basilarsegments of the lateral wall and inferolateral wall. No plans for /further cardiac testing at this time.  Patient was started on Imdur 30mg  daily and D/c 02/18/19 and will follow-up w/cardiology 03/13/19.Marland KitchenJohny Chess

## 2019-02-19 NOTE — Telephone Encounter (Signed)
Patient scheduled for 2/9 with Melina Copa for hospital follow up visit.

## 2019-02-19 NOTE — Telephone Encounter (Signed)
Medication Refill - Medication: nitroGLYCERIN (NITROSTAT) 0.4 MG SL tablet    Preferred Pharmacy (with phone number or street name):  [facility] [place], [place] - [address] Phone:  [phone]  Fax:  [phone]       Agent: Please be advised that RX refills may take up to 3 business days. We ask that you follow-up with your pharmacy.

## 2019-02-19 NOTE — Telephone Encounter (Signed)
Requested Prescriptions  Pending Prescriptions Disp Refills  . nitroGLYCERIN (NITROSTAT) 0.4 MG SL tablet 25 tablet 0    Sig: Place 1 tablet (0.4 mg total) under the tongue every 5 (five) minutes as needed for chest pain (Please call your doctor/911 if no improvement after the second dose).     Cardiovascular:  Nitrates Failed - 02/19/2019 12:24 PM      Failed - Last BP in normal range    BP Readings from Last 1 Encounters:  02/18/19 (!) 158/73         Passed - Last Heart Rate in normal range    Pulse Readings from Last 1 Encounters:  02/18/19 (!) 43         Passed - Valid encounter within last 12 months    Recent Outpatient Visits          2 months ago Chronic combined systolic and diastolic CHF (congestive heart failure) ([place])   [place], MD   6 months ago Essential hypertension   [place], Evie Lacks, MD   7 months ago Essential hypertension   [place], Evie Lacks, MD   11 months ago Essential hypertension   [place], Evie Lacks, MD   1 year ago Essential hypertension   [place], Evie Lacks, MD      Future Appointments            In 1 week Plotnikov, Evie Lacks, MD [place] at [facility]   In 3 weeks Dunn, Nedra Hai, PA-C [place]

## 2019-02-27 ENCOUNTER — Ambulatory Visit (INDEPENDENT_AMBULATORY_CARE_PROVIDER_SITE_OTHER): Payer: Medicare Other | Admitting: Internal Medicine

## 2019-02-27 ENCOUNTER — Encounter: Payer: Self-pay | Admitting: Internal Medicine

## 2019-02-27 ENCOUNTER — Other Ambulatory Visit: Payer: Self-pay

## 2019-02-27 DIAGNOSIS — I208 Other forms of angina pectoris: Secondary | ICD-10-CM

## 2019-02-27 DIAGNOSIS — F4321 Adjustment disorder with depressed mood: Secondary | ICD-10-CM | POA: Diagnosis not present

## 2019-02-27 DIAGNOSIS — I255 Ischemic cardiomyopathy: Secondary | ICD-10-CM

## 2019-02-27 DIAGNOSIS — I10 Essential (primary) hypertension: Secondary | ICD-10-CM | POA: Diagnosis not present

## 2019-02-27 DIAGNOSIS — F0390 Unspecified dementia without behavioral disturbance: Secondary | ICD-10-CM

## 2019-02-27 DIAGNOSIS — R269 Unspecified abnormalities of gait and mobility: Secondary | ICD-10-CM | POA: Diagnosis not present

## 2019-02-27 MED ORDER — SIMVASTATIN 80 MG PO TABS: 80.0000 mg | ORAL_TABLET | Freq: Every day | ORAL | 3 refills | Status: DC

## 2019-02-27 MED ORDER — TAMSULOSIN HCL 0.4 MG PO CAPS: 0.4000 mg | ORAL_CAPSULE | Freq: Every day | ORAL | 3 refills | Status: AC

## 2019-02-27 MED ORDER — ISOSORBIDE MONONITRATE ER 30 MG PO TB24: 30.0000 mg | ORAL_TABLET | Freq: Every day | ORAL | 3 refills | Status: AC

## 2019-02-27 MED ORDER — PANTOPRAZOLE SODIUM 40 MG PO TBEC: 40.0000 mg | DELAYED_RELEASE_TABLET | Freq: Every day | ORAL | 3 refills | Status: AC

## 2019-02-27 MED ORDER — FINASTERIDE 5 MG PO TABS: 5.0000 mg | ORAL_TABLET | Freq: Every day | ORAL | 3 refills | Status: AC

## 2019-02-27 NOTE — Assessment & Plan Note (Signed)
Doing ok - not too good w/isolation

## 2019-02-27 NOTE — Progress Notes (Signed)
Subjective:  Patient ID: [name], male    DOB: [birth year]  Age: 86 y.o. MRN: [id number]  CC: No chief complaint on file.   HPI [name] presents for CP ER visit on 1/16 - Imdur was given F/u CAD, CHF, dementia  Outpatient Medications Prior to Visit  Medication Sig Dispense Refill  . aspirin 81 MG EC tablet Take 81 mg by mouth daily.      [name] b complex vitamins tablet Take 1 tablet by mouth daily.      . Cholecalciferol 1000 UNITS tablet Take 1,000 Units by mouth daily.      [name] Calcium (STOOL SOFTENER PO) Take 1 capsule by mouth at bedtime.    . finasteride (PROSCAR) 5 MG tablet Take 1 tablet (5 mg total) by mouth daily. 90 tablet 2  . Glucosamine-Chondroit-Vit C-Mn (GLUCOSAMINE 1500 COMPLEX PO) Take 1 tablet by mouth 2 (two) times daily.     . isosorbide mononitrate (IMDUR) 30 MG 24 hr tablet Take 1 tablet (30 mg total) by mouth daily. 30 tablet 0  . nitroGLYCERIN (NITROSTAT) 0.4 MG SL tablet Place 1 tablet (0.4 mg total) under the tongue every 5 (five) minutes as needed for chest pain (Please call your doctor/911 if no improvement after the second dose). 25 tablet 0  . pantoprazole (PROTONIX) 40 MG tablet Take 1 tablet (40 mg total) by mouth daily. (Patient taking differently: Take 40 mg by mouth daily before breakfast. ) 30 tablet 5  . polyethylene glycol (MIRALAX / GLYCOLAX) 17 g packet Take 17 g by mouth daily. 14 each 0  . simvastatin (ZOCOR) 80 MG tablet Take 1 tablet (80 mg total) by mouth daily. (Patient taking differently: Take 80 mg by mouth at bedtime. ) 90 tablet 0  . tamsulosin (FLOMAX) 0.4 MG CAPS capsule Take 1 capsule (0.4 mg total) by mouth daily after breakfast. (Patient taking differently: Take 0.4 mg by mouth at bedtime. ) 30 capsule 3  . vitamin C (ASCORBIC ACID) 500 MG tablet Take 500 mg by mouth daily.     No facility-administered medications prior to visit.    ROS: Review of Systems  Constitutional: Negative for appetite change, fatigue and  unexpected weight change.  HENT: Negative for congestion, nosebleeds, sneezing, sore throat and trouble swallowing.   Eyes: Negative for itching and visual disturbance.  Respiratory: Negative for cough.   Cardiovascular: Positive for chest pain. Negative for palpitations and leg swelling.  Gastrointestinal: Negative for abdominal distention, blood in stool, diarrhea and nausea.  Genitourinary: Negative for frequency and hematuria.  Musculoskeletal: Positive for arthralgias and gait problem. Negative for back pain, joint swelling and neck pain.  Skin: Negative for rash.  Neurological: Negative for dizziness, tremors, speech difficulty and weakness.  Psychiatric/Behavioral: Positive for decreased concentration. Negative for agitation, dysphoric mood and sleep disturbance. The patient is not nervous/anxious.     Objective:  BP 122/66 (BP Location: Left Arm, Patient Position: Sitting, Cuff Size: Normal)   Pulse 74   Temp 98 F (36.7 C) (Oral)   Ht 5\' 9"  (1.753 m)   Wt 143 lb (64.9 kg)   SpO2 97%   BMI 21.12 kg/m   BP Readings from Last 3 Encounters:  02/27/19 122/66  02/18/19 (!) 158/73  11/27/18 124/64    Wt Readings from Last 3 Encounters:  02/27/19 143 lb (64.9 kg)  02/18/19 134 lb 6.4 oz (61 kg)  11/27/18 138 lb (62.6 kg)    Physical Exam Constitutional:  General: He is not in acute distress.    Appearance: He is well-developed.     Comments: NAD  Eyes:     Conjunctiva/sclera: Conjunctivae normal.     Pupils: Pupils are equal, round, and reactive to light.  Neck:     Thyroid: No thyromegaly.     Vascular: No JVD.  Cardiovascular:     Rate and Rhythm: Normal rate and regular rhythm.     Heart sounds: Normal heart sounds. No murmur. No friction rub. No gallop.   Pulmonary:     Effort: Pulmonary effort is normal. No respiratory distress.     Breath sounds: Normal breath sounds. No wheezing or rales.  Chest:     Chest wall: No tenderness.  Abdominal:      General: Bowel sounds are normal. There is no distension.     Palpations: Abdomen is soft. There is no mass.     Tenderness: There is no abdominal tenderness. There is no guarding or rebound.  Musculoskeletal:        General: Tenderness present. Normal range of motion.     Cervical back: Normal range of motion.  Lymphadenopathy:     Cervical: No cervical adenopathy.  Skin:    General: Skin is warm and dry.     Findings: No rash.  Neurological:     Mental Status: He is alert and oriented to person, place, and time.     Cranial Nerves: No cranial nerve deficit.     Motor: No abnormal muscle tone.     Coordination: Coordination normal.     Gait: Gait normal.     Deep Tendon Reflexes: Reflexes are normal and symmetric.  Psychiatric:        Behavior: Behavior normal.        Thought Content: Thought content normal.        Judgment: Judgment normal.    Walker   ER notes, CL heart test, labs - reviewed Time 60 min  Lab Results  Component Value Date   WBC 7.5 02/17/2019   HGB 13.9 02/17/2019   HCT 42.5 02/17/2019   PLT 191 02/17/2019   GLUCOSE 114 (H) 02/17/2019   CHOL 159 04/22/2017   TRIG 68 04/22/2017   HDL 72 04/22/2017   LDLCALC 73 04/22/2017   ALT 14 11/27/2018   AST 20 11/27/2018   NA 139 02/17/2019   K 4.2 02/17/2019   CL 104 02/17/2019   CREATININE 1.13 02/17/2019   BUN 17 02/17/2019   CO2 23 02/17/2019   TSH 3.57 12/15/2017   PSA 6.45 (H) 08/07/2013   INR 1.1 08/02/2018   HGBA1C 6.4 12/05/2012    DG Chest 2 View  Result Date: 02/17/2019 CLINICAL DATA:  Chest pain and slight shortness of breath. EXAM: CHEST - 2 VIEW COMPARISON:  August 02, 2018 FINDINGS: Cardiomediastinal silhouette is enlarged. Calcific atherosclerotic disease and tortuosity of the aorta. Mediastinal contours appear intact. There is no evidence of focal airspace consolidation, pleural effusion or pneumothorax. Increased elevation of the left hemidiaphragm. Osseous structures are without acute  abnormality. Soft tissues are grossly normal. IMPRESSION: 1. Enlarged cardiac silhouette. 2. Calcific atherosclerotic disease and tortuosity of the aorta. 3. Increased elevation of the left hemidiaphragm. Electronically Signed   By: Fidela Salisbury M.D.   On: 02/17/2019 12:50   NM Myocar Multi W/Spect W/Wall Motion / EF  Result Date: 02/18/2019 CLINICAL DATA:  86 year old male with chest pain EXAM: MYOCARDIAL IMAGING WITH SPECT (REST AND PHARMACOLOGIC-STRESS) GATED LEFT VENTRICULAR WALL MOTION  STUDY LEFT VENTRICULAR EJECTION FRACTION TECHNIQUE: Standard myocardial SPECT imaging was performed after resting intravenous injection of 10 mCi Tc-66m tetrofosmin. Subsequently, intravenous infusion of Lexiscan was performed under the supervision of the Cardiology staff. At peak effect of the drug, 30 mCi Tc-30m tetrofosmin was injected intravenously and standard myocardial SPECT imaging was performed. Quantitative gated imaging was also performed to evaluate left ventricular wall motion, and estimate left ventricular ejection fraction. COMPARISON:  None. FINDINGS: Perfusion: Decreased perfusion to the mid and basilar segment of the lateral wall which is fixed on rest and stress. Decreased perfusion to the inferolateral wall which is fixed on rest and stress involving the apical mid and basilar segment. Wall Motion: Global hypokinesia Left Ventricular Ejection Fraction: 44 % End diastolic volume [id number] ml End systolic volume 57 ml IMPRESSION: 1. No reversible ischemia. Large scar involving the mid and basilar segments of the lateral wall and inferolateral wall. 2. Global hypokinesia. 3. Left ventricular ejection fraction 44% 4. Non invasive risk stratification*: Intermediate *2012 Appropriate Use Criteria for Coronary Revascularization Focused Update: J Am Coll Cardiol. N6492421. http://content.airportbarriers.com.aspx?articleid=1201161 Electronically Signed   By: Suzy Bouchard M.D.   On: 02/18/2019  11:57    Assessment & Plan:   There are no diagnoses linked to this encounter.   No orders of the defined types were placed in this encounter.    Follow-up: No follow-ups on file.  Walker Kehr, MD

## 2019-02-27 NOTE — Assessment & Plan Note (Signed)
[facility]

## 2019-02-27 NOTE — Assessment & Plan Note (Addendum)
S/p ER visit; CL - scar, reduced EF Imdur

## 2019-02-27 NOTE — Assessment & Plan Note (Signed)
Stable memory loss COVID isolation is contributing

## 2019-02-27 NOTE — Assessment & Plan Note (Signed)
BP Readings from Last 3 Encounters:  02/27/19 122/66  02/18/19 (!) 158/73  11/27/18 124/64

## 2019-03-02 IMAGING — DX DG HIP (WITH OR WITHOUT PELVIS) 2-3V*L*
3 series · 3 of 3 positions shown · non-contrast
Comparison: None.

CLINICAL DATA: Recent fall with left hip pain, initial encounter

EXAM:
DG HIP (WITH OR WITHOUT PELVIS) 3V LEFT

[pelvis ap]
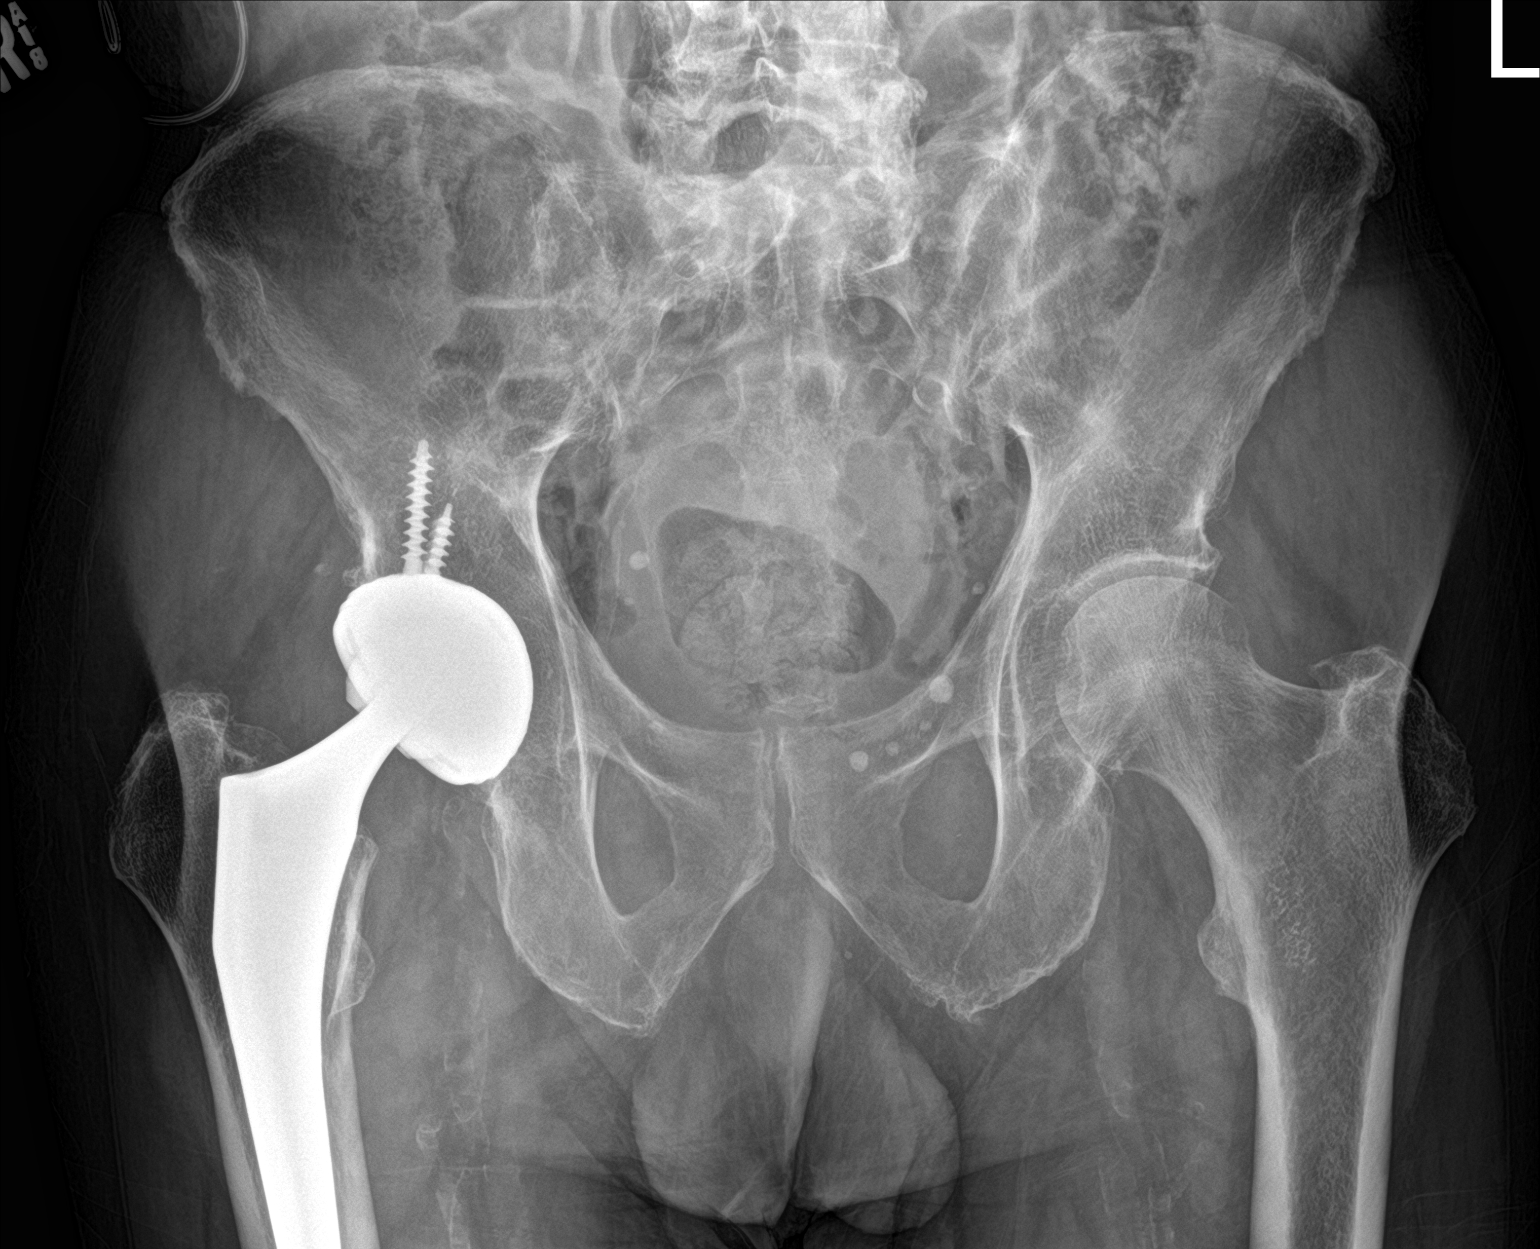

[hip ap]
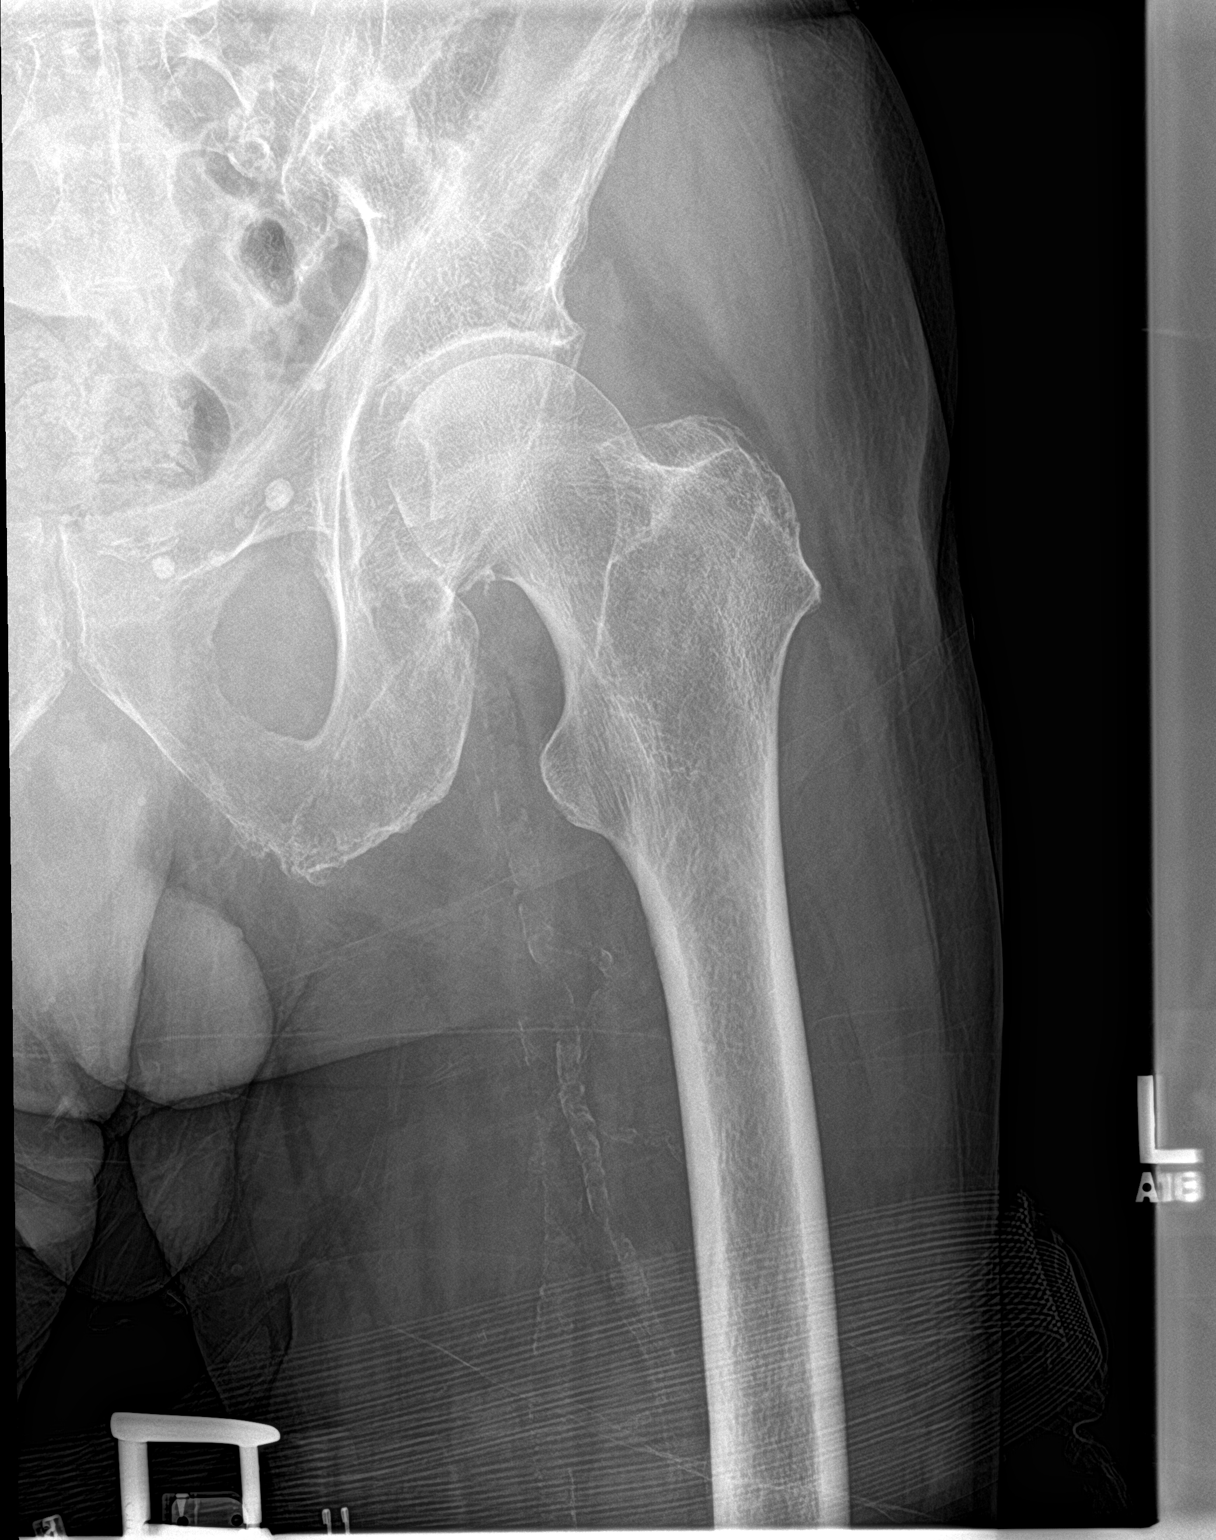

[hip lat]
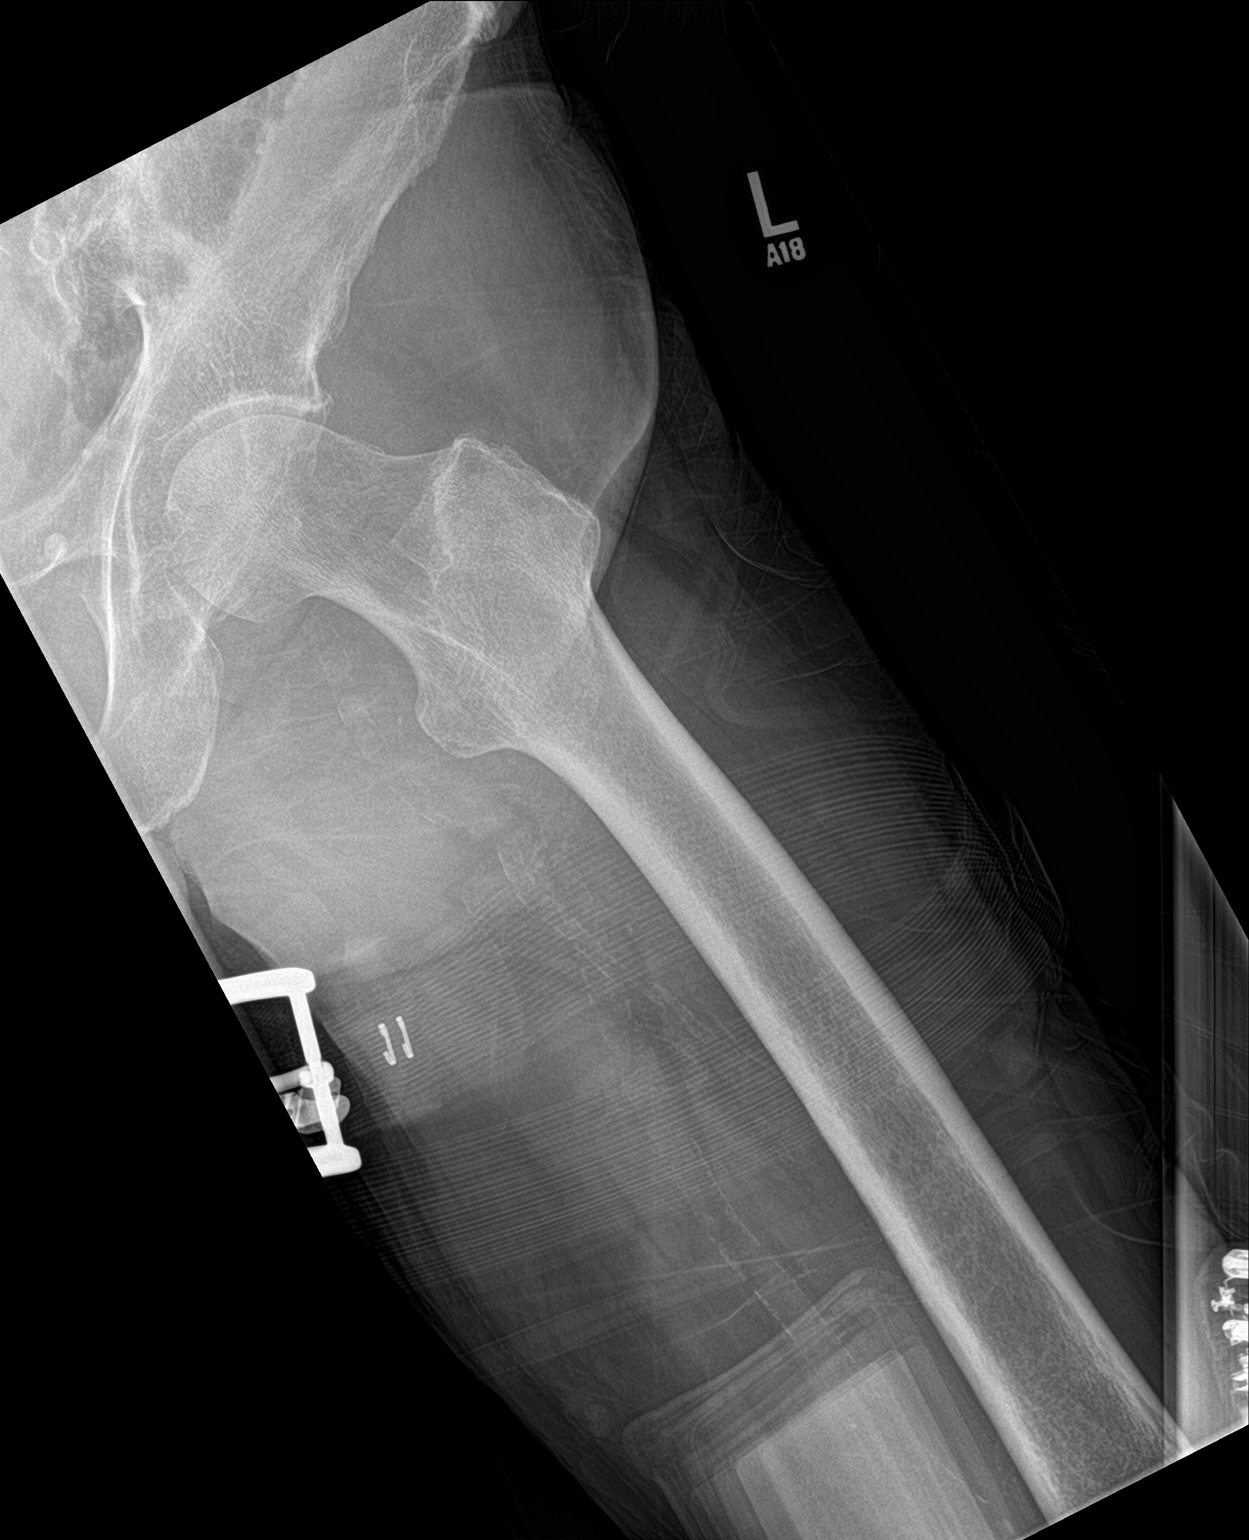

[3 of 3 positions shown; findings below may reference images not displayed]

FINDINGS: Right hip replacement is noted. Pelvic ring is intact. Mild
degenerative changes of the left hip joint are seen. No acute
fracture or dislocation is noted. Diffuse vascular calcifications
are seen.
IMPRESSION: No acute abnormality noted.

## 2019-03-02 IMAGING — CT CT ANGIO CHEST
2 of 9 series · 18 of 46 positions shown · IV contrast (iopamidol)
Comparison: Radiographs earlier this day.  No prior chest CT.

CLINICAL DATA: Chest pain.

EXAM:
CT ANGIOGRAPHY CHEST WITH CONTRAST
TECHNIQUE: Multidetector CT imaging of the chest was performed using the
standard protocol during bolus administration of intravenous
contrast. Multiplanar CT image reconstructions and MIPs were
obtained to evaluate the vascular anatomy.
CONTRAST:  100mL [id number]-N4Y IOPAMIDOL ([id number]) INJECTION 76%

[Series 6: cta chest 1.0 i30f 3 · axial · 0.79mm/px · z∈[+1078,+1356]mm · 15 of 313 slices shown]
[im 18/313  lung]
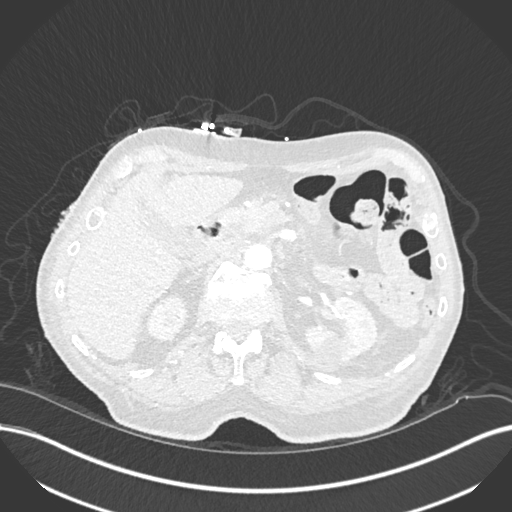
[im 35/313  soft-tissue]
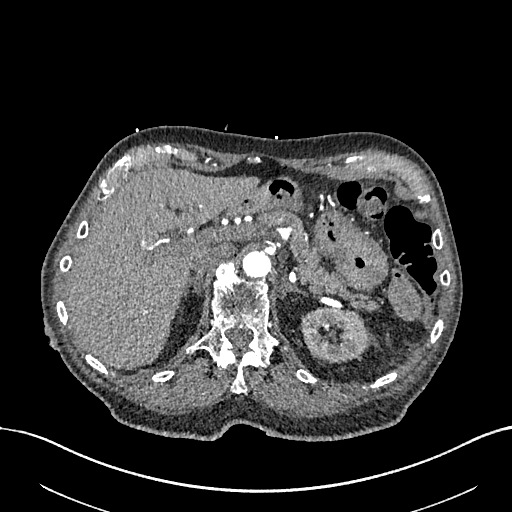
[im 53/313  lung]
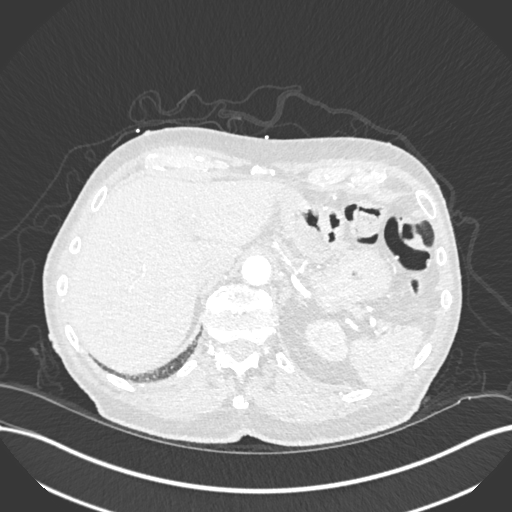
[im 70/313  soft-tissue]
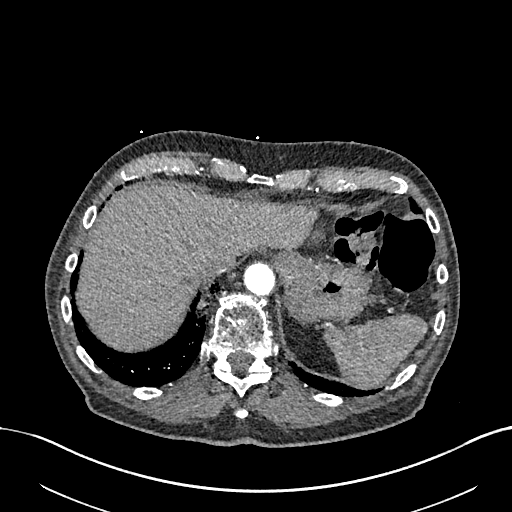
[im 105/313  lung]
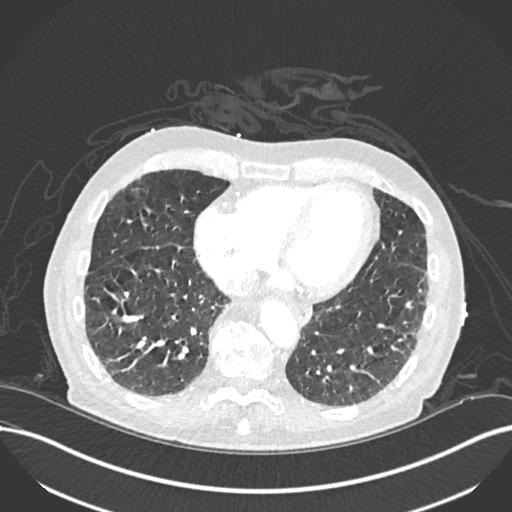
[im 122/313  soft-tissue]
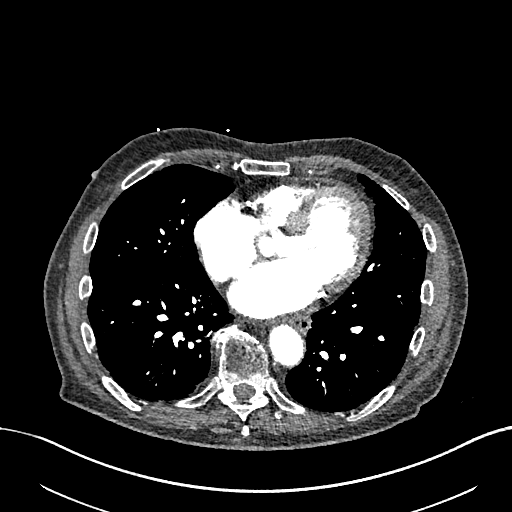
[im 139/313  lung]
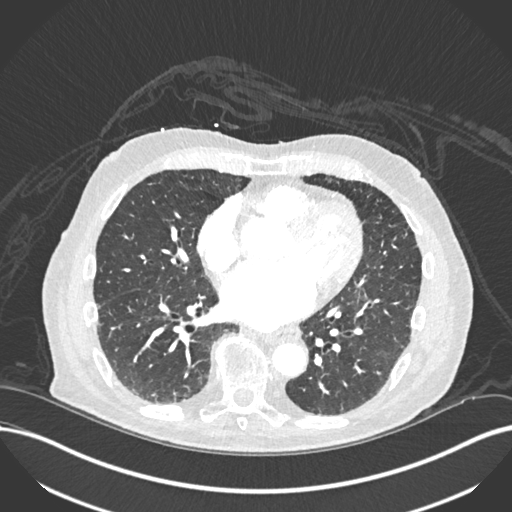
[im 157/313  soft-tissue]
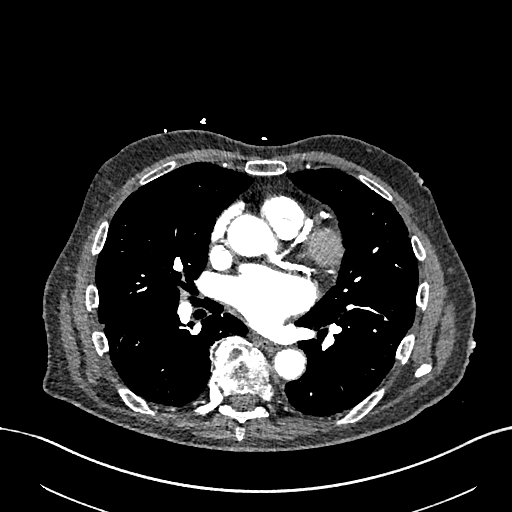
[im 174/313  lung]
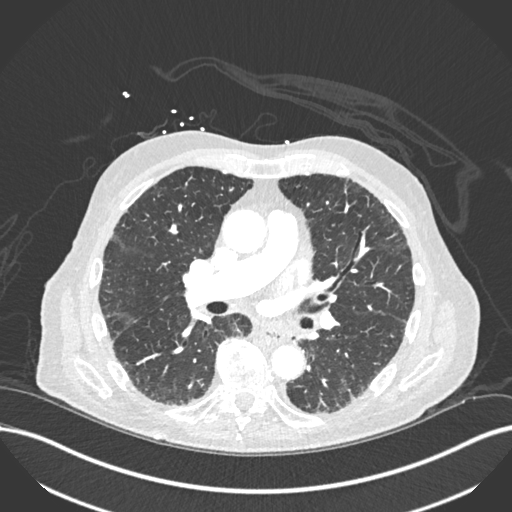
[im 191/313  soft-tissue]
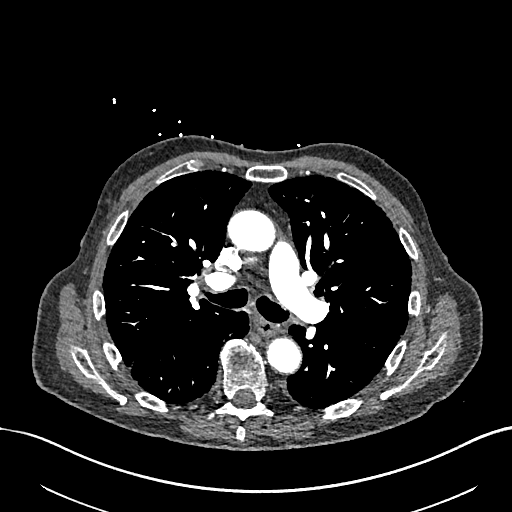
[im 209/313  lung]
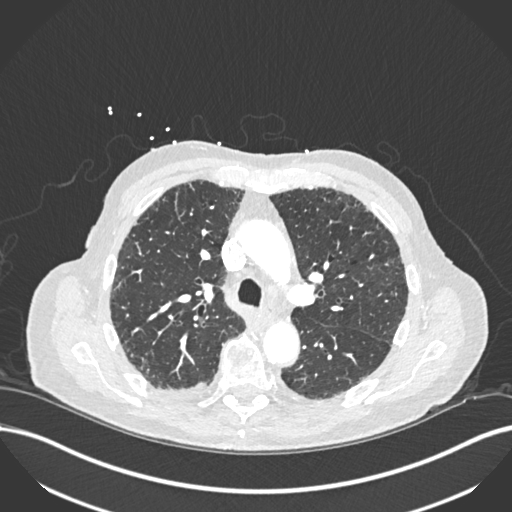
[im 243/313  soft-tissue]
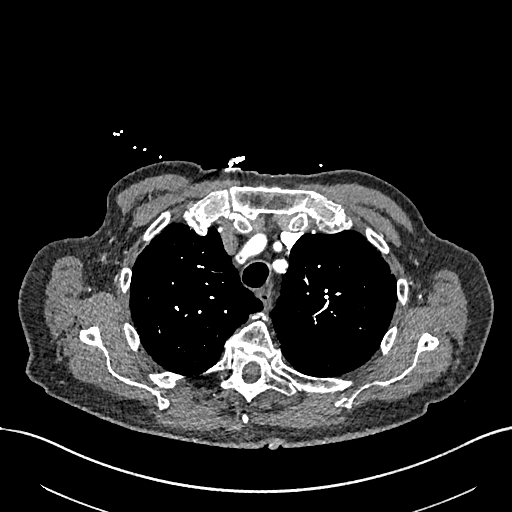
[im 261/313  lung]
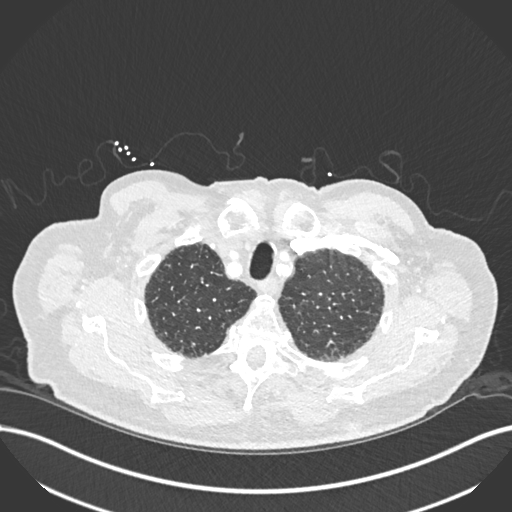
[im 278/313  soft-tissue]
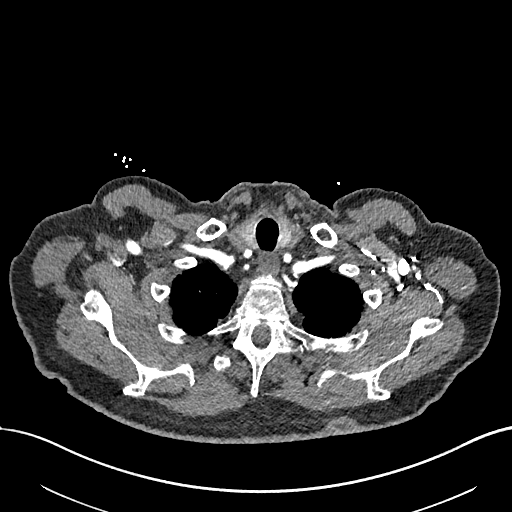
[im 295/313  lung]
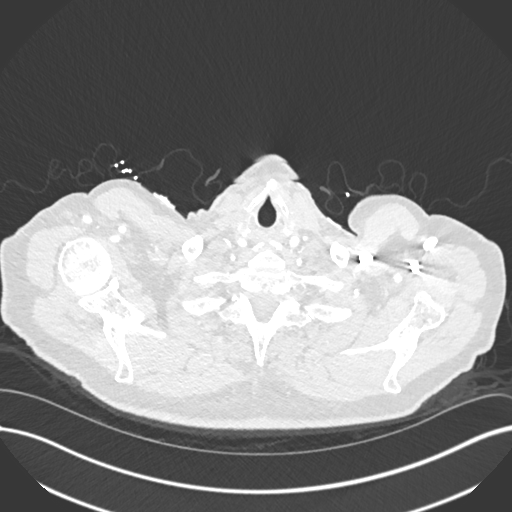

[Series 8: coronals · coronal · 0.62mm/px · 3 of 126 slices shown]
[im 32/126  soft-tissue]
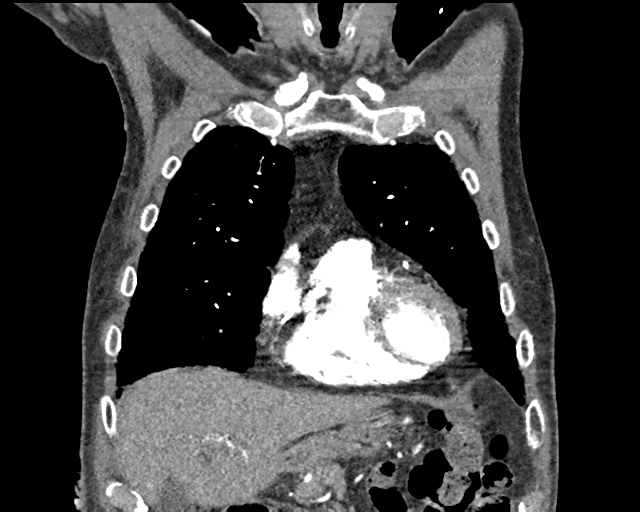
[im 63/126  soft-tissue]
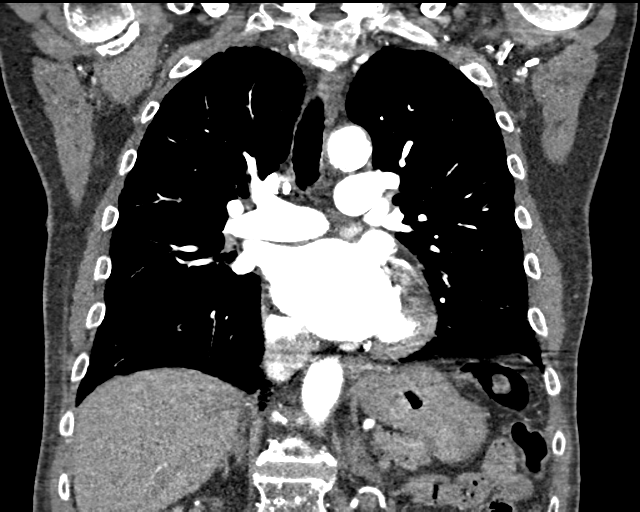
[im 94/126  soft-tissue]
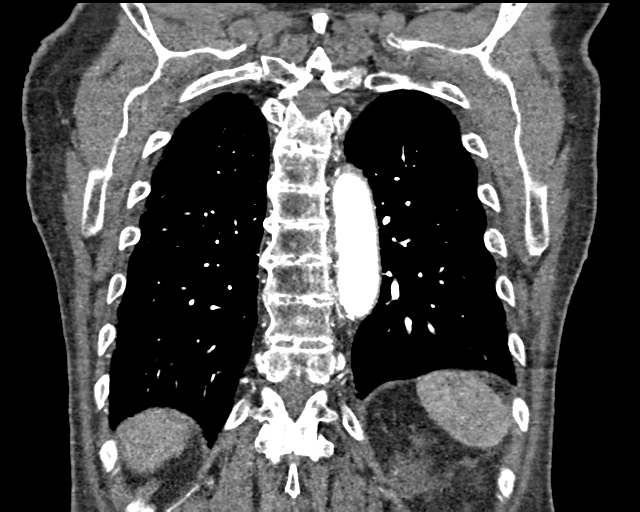

[18 of 46 positions shown; findings below may reference images not displayed]

FINDINGS: Cardiovascular: There are no filling defects within the pulmonary
arteries to suggest pulmonary embolus. Aortic tortuosity with
atherosclerosis. No dissection or aneurysm. Conventional branching
pattern from the aortic arch with atherosclerosis at the great
vessel origins. Coronary artery calcifications versus stents. Heart
size upper normal. No pericardial effusion.

Mediastinum/Nodes: No enlarged mediastinal or hilar lymph nodes. The
esophagus is nondistended. No visualized thyroid nodule.

Lungs/Pleura: Mild diffuse subpleural reticulation throughout both
lungs without definite apical basilar gradient. No focal
consolidation. No septal thickening or ground-glass opacities to
suggest pulmonary edema. Minimal perifissural atelectasis in the
right major fissure. Trace biapical pleuroparenchymal scarring. No
pleural effusion. No pulmonary mass.

Upper Abdomen: Cyst in the upper left kidney measures 2.8 cm with
thin calcified septa. Mild atherosclerosis of upper abdominal
vasculature.

Musculoskeletal: Multilevel degenerative change throughout the
thoracic spine with mild exaggerated thoracic kyphosis. There are no
acute or suspicious osseous abnormalities.

Review of the MIP images confirms the above findings.
IMPRESSION: 1. No pulmonary embolus, aortic dissection, or acute intrathoracic
abnormality.
2. Minimal subpleural reticulation throughout both lungs may
represent mild interstitial lung disease.
3. Aortic Atherosclerosis ([id number]). Coronary artery
calcifications/stents.
4. Bosniak 2 cyst in the left kidney measuring 2.8 cm. No further
workup or follow-up needed.

## 2019-03-12 ENCOUNTER — Encounter: Payer: Self-pay | Admitting: Physician Assistant

## 2019-03-12 NOTE — Progress Notes (Signed)
Cardiology Office Note    Date:  03/13/2019   ID:  [name], DOB October 25, 1923, MRN [id number]  PCP:  Cassandria Anger, MD  Cardiologist:  Lauree Chandler, MD  Electrophysiologist:  None   Chief Complaint: f/u chest pain  History of Present Illness:   [name] is a 86 y.o. male with history of CAD (s/p BMS to LCx in 2002, DES to mid-LAD in 2007), ischemic cardiomyopathy (EF 45-50% by echo in 04/2017), bradycardia (requiring cessation of carvedilol), CKD stage II by labs, HTN, HLD, mild AI, mild MR and dementia who presents for post-hospital follow-up.  He has not required cath since 2007.  Last echo 04/2017 showed mild LVH, EF 45-50%, akinesis of the basal-midinferoseptal myocardium, grade 1 DD, mild AI/MR, mild LAE, mildly increased PASP. He has history of previous dyspnea associated with bradycardia requiring discontinuation of his beta blocker. He was reecntly readmitted with chest discomfort and dyspnea associated with pounding in his chest. He r/o for MI. He underwent stress test showing no reversible ischemia. He did have a large scar involving the mid and basilar segments of the lateral wall and inferolateral wall. He was started on Imdur 30mg  daily. Last labs otherwise reviewed include 02/2019 K 4.2, Cr 1.13 (c/w prior), 11/2018 LFTs wnl, 2019 TSH wnl, LDL 73.  He presents back to clinic today feeling well. His wife accompanies him to the visit. They have been married 72 years. He is a man of few words. They indicate he has had only good days since hospital discharge. He has not had any recurrent CP. No dyspnea, falls, edema, palpitations.   Past Medical History:  Diagnosis Date  . CAD (coronary artery disease)    s/p BMS to LCx in 2002, DES to mid-LAD in 2007  . Dizziness and giddiness   . Hypertrophy of prostate without urinary obstruction and other lower urinary tract symptoms (LUTS)   . Ischemic cardiomyopathy   . MI (myocardial infarction) ([place]) 2002  . Mild  aortic insufficiency   . Mild mitral regurgitation   . Osteoarthrosis, unspecified whether generalized or localized, unspecified site   . Other and unspecified hyperlipidemia   . Sinus bradycardia   . Skin cancer    "top of his head"  . Thrombocytopenia, unspecified ([place])   . Unspecified essential hypertension     Past Surgical History:  Procedure Laterality Date  . COLONOSCOPY    . CORONARY ANGIOPLASTY  01/07/2001, 2008   2 stents  . ESOPHAGOGASTRODUODENOSCOPY N/A 04/22/2014   Procedure: ESOPHAGOGASTRODUODENOSCOPY (EGD);  Surgeon: Milus Banister, MD;  Location: [facility];  Service: Endoscopy;  Laterality: N/A;  . HIP ARTHROPLASTY Right 2006  . JOINT REPLACEMENT    . MOHS SURGERY     "top of his head"  . TRANSURETHRAL RESECTION OF PROSTATE      Current Medications: Current Meds  Medication Sig  . aspirin 81 MG EC tablet Take 81 mg by mouth daily.    [name] b complex vitamins tablet Take 1 tablet by mouth daily.    . Cholecalciferol 1000 UNITS tablet Take 1,000 Units by mouth daily.    [name] Calcium (STOOL SOFTENER PO) Take 1 capsule by mouth at bedtime.  . finasteride (PROSCAR) 5 MG tablet Take 1 tablet (5 mg total) by mouth daily.  . Glucosamine-Chondroit-Vit C-Mn (GLUCOSAMINE 1500 COMPLEX PO) Take 1 tablet by mouth 2 (two) times daily.   . isosorbide mononitrate (IMDUR) 30 MG 24 hr tablet Take 1 tablet (30 mg total)  by mouth daily.  . nitroGLYCERIN (NITROSTAT) 0.4 MG SL tablet Place 1 tablet (0.4 mg total) under the tongue every 5 (five) minutes as needed for chest pain (Please call your doctor/911 if no improvement after the second dose).  . pantoprazole (PROTONIX) 40 MG tablet Take 1 tablet (40 mg total) by mouth daily.  . polyethylene glycol (MIRALAX / GLYCOLAX) 17 g packet Take 17 g by mouth daily.  . simvastatin (ZOCOR) 80 MG tablet Take 1 tablet (80 mg total) by mouth daily.  . tamsulosin (FLOMAX) 0.4 MG CAPS capsule Take 1 capsule (0.4 mg total) by mouth daily after  breakfast.  . vitamin C (ASCORBIC ACID) 500 MG tablet Take 500 mg by mouth daily.      Allergies:   Oxycodone and Penicillins   Social History   Socioeconomic History  . Marital status: Married    Spouse name: [name]  . Number of children: 2  . Years of education: Not on file  . Highest education level: Not on file  Occupational History  . Occupation: Retired    Fish farm manager: RETIRED  Tobacco Use  . Smoking status: Never Smoker  . Smokeless tobacco: Never Used  Substance and Sexual Activity  . Alcohol use: No    Alcohol/week: 0.0 standard drinks  . Drug use: No  . Sexual activity: Not Currently  Other Topics Concern  . Not on file  Social History Narrative   Regular Exercise -  YES         Social Determinants of Health   Financial Resource Strain:   . Difficulty of Paying Living Expenses: Not on file  Food Insecurity:   . Worried About Charity fundraiser in the Last Year: Not on file  . Ran Out of Food in the Last Year: Not on file  Transportation Needs:   . Lack of Transportation (Medical): Not on file  . Lack of Transportation (Non-Medical): Not on file  Physical Activity:   . Days of Exercise per Week: Not on file  . Minutes of Exercise per Session: Not on file  Stress:   . Feeling of Stress : Not on file  Social Connections:   . Frequency of Communication with Friends and Family: Not on file  . Frequency of Social Gatherings with Friends and Family: Not on file  . Attends Religious Services: Not on file  . Active Member of Clubs or Organizations: Not on file  . Attends Archivist Meetings: Not on file  . Marital Status: Not on file     Family History:  The patient's family history includes Coronary artery disease in an other family member; Heart disease in his father. There is no history of Colon cancer.  ROS:   Please see the history of present illness.  All other systems are reviewed and otherwise negative.    EKGs/Labs/Other Studies  Reviewed:    Studies reviewed are outlined and summarized above. Reports included below if pertinent.  NST 02/18/19 IMPRESSION: 1. No reversible ischemia. Large scar involving the mid and basilar segments of the lateral wall and inferolateral wall. 2. Global hypokinesia. 3. Left ventricular ejection fraction 44% 4. Non invasive risk stratification*: Intermediate *2012 Appropriate Use Criteria for Coronary Revascularization Focused Update: J Am Coll Cardiol. N6492421. http://content.airportbarriers.com.aspx?articleid=1201161   2D echo 04/2017 - Left ventricle: The cavity size was normal. Wall thickness was  increased in a pattern of mild LVH. Systolic function was mildly  reduced. The estimated ejection fraction was in the range of 45%  to 50%. There is akinesis of the basal-midinferoseptal  myocardium. Doppler parameters are consistent with abnormal left  ventricular relaxation (grade 1 diastolic dysfunction).  - Aortic valve: Trileaflet; mildly thickened, mildly calcified  leaflets. There was mild regurgitation.  - Mitral valve: There was mild regurgitation.  - Left atrium: The atrium was mildly dilated.  - Pulmonary arteries: Systolic pressure was mildly increased. PA  peak pressure: 48 mm Hg (S).     EKG:  EKG is not ordered today  Recent Labs: 11/27/2018: ALT 14 02/17/2019: BUN 17; Creatinine, Ser 1.13; Hemoglobin 13.9; Platelets 191; Potassium 4.2; Sodium 139  Recent Lipid Panel    Component Value Date/Time   CHOL 159 04/22/2017 0900   TRIG 68 04/22/2017 0900   HDL 72 04/22/2017 0900   CHOLHDL 2.2 04/22/2017 0900   VLDL 14 04/22/2017 0900   LDLCALC 73 04/22/2017 0900    PHYSICAL EXAM:    VS:  BP 122/70   Pulse 63   Ht 5\' 9"  (1.753 m)   Wt 144 lb (65.3 kg)   SpO2 99%   BMI 21.27 kg/m   BMI: Body mass index is 21.27 kg/m.  GEN: Well nourished, well developed elderly WM, in no acute distress HEENT: normocephalic, atraumatic Neck:  no JVD, carotid bruits, or masses Cardiac: RRR; no murmurs, rubs, or gallops, no edema  Respiratory:  clear to auscultation bilaterally, normal work of breathing GI: soft, nontender, nondistended, + BS MS: no deformity or atrophy Skin: warm and dry, no rash Neuro:  Alert, oriented to self, Strength and sensation are intact, follows commands but generally quiet during examination Psych: reserved affect  Wt Readings from Last 3 Encounters:  03/13/19 144 lb (65.3 kg)  02/27/19 143 lb (64.9 kg)  02/18/19 134 lb 6.4 oz (61 kg)     ASSESSMENT & PLAN:   1. CAD with chronic angina - recent events outlined above. He is doing well now without recurrent symptoms. Continue Imdur, aspirin. See below regarding statin. Not on BB due to bradycardia. 2. Ischemic cardiomyopathy with mild AI/MR - appears euvolemic. No indication to repeat echo at this time. His blood pressure tends to run low-normal so would not add ACEI/ARB at this time. 3. Sinus bradycardia - HR stable, no evidence of symptomatic recurrence. 4. Essential HTN - BP is normal today. Continue present regimen. 5. Hyperlipidemia - simvastatin dose is noted to be 80mg  daily. Per literature, doses of simvastatin 80 mg/day are generally not recommended due to increased risk of myopathy, although some sources indicate it may be acceptable to continue if there had not been prior issues at high dose. It appears to be most recently filled by primary care. I will reach out to Dr. Alain Marion to find out if he would be OK with changing to atorvastatin vs reduction in dose of simvastatin. Most recent LDL was 73, so not quite optimally at goal but close, although I do not suspect there is significant long term benefit to aggressively lowering further given his advanced age and comorbidities. Granddaughter [name] was on the speakerphone during this conversation and acknowledged this plan. She states they use [facility] if any med changes are  needed.  Disposition: F/u with Dr. Angelena Form in 6 months.  Medication Adjustments/Labs and Tests Ordered: Current medicines are reviewed at length with the patient today.  Concerns regarding medicines are outlined above. Medication changes, Labs and Tests ordered today are summarized above and listed in the Patient Instructions accessible in Encounters.   Signed, Lisbeth Renshaw  Deanne Coffer, PA-C  03/13/2019 12:33 PM    [facility] [place], [place], [place]  [address] Phone: [phone]; Fax: [phone]

## 2019-03-13 ENCOUNTER — Other Ambulatory Visit: Payer: Self-pay

## 2019-03-13 ENCOUNTER — Encounter: Payer: Self-pay | Admitting: Physician Assistant

## 2019-03-13 ENCOUNTER — Ambulatory Visit (INDEPENDENT_AMBULATORY_CARE_PROVIDER_SITE_OTHER): Payer: Medicare Other | Admitting: Physician Assistant

## 2019-03-13 VITALS — BP 122/70 | HR 63 | Ht 69.0 in | Wt 144.0 lb

## 2019-03-13 DIAGNOSIS — I340 Nonrheumatic mitral (valve) insufficiency: Secondary | ICD-10-CM | POA: Diagnosis not present

## 2019-03-13 DIAGNOSIS — E785 Hyperlipidemia, unspecified: Secondary | ICD-10-CM

## 2019-03-13 DIAGNOSIS — I351 Nonrheumatic aortic (valve) insufficiency: Secondary | ICD-10-CM

## 2019-03-13 DIAGNOSIS — I255 Ischemic cardiomyopathy: Secondary | ICD-10-CM | POA: Diagnosis not present

## 2019-03-13 DIAGNOSIS — I25118 Atherosclerotic heart disease of native coronary artery with other forms of angina pectoris: Secondary | ICD-10-CM

## 2019-03-13 DIAGNOSIS — R001 Bradycardia, unspecified: Secondary | ICD-10-CM

## 2019-03-13 DIAGNOSIS — I10 Essential (primary) hypertension: Secondary | ICD-10-CM

## 2019-03-13 NOTE — Patient Instructions (Signed)
Medication Instructions:  Your physician recommends that you continue on your current medications as directed. Please refer to the Current Medication list given to you today.  *If you need a refill on your cardiac medications before your next appointment, please call your pharmacy*  Lab Work: None ordered  If you have labs (blood work) drawn today and your tests are completely normal, you will receive your results only by: . MyChart Message (if you have MyChart) OR . A paper copy in the mail If you have any lab test that is abnormal or we need to change your treatment, we will call you to review the results.  Testing/Procedures: None ordered  Follow-Up: At [facility], you and your health needs are our priority.  As part of our continuing mission to provide you with exceptional heart care, we have created designated Provider Care Teams.  These Care Teams include your primary Cardiologist (physician) and Advanced Practice Providers (APPs -  Physician Assistants and Nurse Practitioners) who all work together to provide you with the care you need, when you need it.  Your next appointment:   6 month(s)  The format for your next appointment:   In Person  Provider:   You may see Christopher McAlhany, MD or one of the following Advanced Practice Providers on your designated Care Team:    Dayna Dunn, PA-C  Michele Lenze, PA-C   Other Instructions   

## 2019-03-27 ENCOUNTER — Telehealth: Payer: Self-pay | Admitting: Physician Assistant

## 2019-03-27 MED ORDER — SIMVASTATIN 40 MG PO TABS: 40.0000 mg | ORAL_TABLET | Freq: Every day | ORAL | 3 refills | Status: DC

## 2019-03-27 NOTE — Telephone Encounter (Signed)
   Please let pt/daughter know I conferred with Dr. Alain Marion about simvastatin dose. At recent OV I had discussed with them that 80mg  dose has fallen out of favor due to risk of muscle issues. Dr. Alain Marion agrees with recommendation to reduce simvastatin dose to 40mg  daily. Can you let them know? They can cut his 80mg  tablet in half until they run out and then rx for new 40mg  tab. Thanks, [employer]

## 2019-03-27 NOTE — Telephone Encounter (Signed)
Pt and wife both have been made aware to reduce the Simvastatin to 40 mg daily. rx sent to [facility], per their request.

## 2019-03-27 NOTE — Telephone Encounter (Signed)
Call placed to pt re: phone message below re: Simvastatin. Left a message for pt to call back.

## 2019-06-13 ENCOUNTER — Encounter (HOSPITAL_COMMUNITY): Payer: Self-pay | Admitting: Obstetrics and Gynecology

## 2019-06-13 ENCOUNTER — Other Ambulatory Visit: Payer: Self-pay

## 2019-06-13 ENCOUNTER — Emergency Department (HOSPITAL_COMMUNITY): Payer: Medicare Other

## 2019-06-13 ENCOUNTER — Emergency Department (HOSPITAL_COMMUNITY)
Admission: EM | Admit: 2019-06-13 | Discharge: 2019-06-13 | Disposition: A | Discharge status: Home / Self Care | Payer: Medicare Other | Attending: Emergency Medicine | Admitting: Emergency Medicine

## 2019-06-13 DIAGNOSIS — I119 Hypertensive heart disease without heart failure: Secondary | ICD-10-CM | POA: Insufficient documentation

## 2019-06-13 DIAGNOSIS — Z743 Need for continuous supervision: Secondary | ICD-10-CM | POA: Diagnosis not present

## 2019-06-13 DIAGNOSIS — K5730 Diverticulosis of large intestine without perforation or abscess without bleeding: Secondary | ICD-10-CM | POA: Diagnosis not present

## 2019-06-13 DIAGNOSIS — K5900 Constipation, unspecified: Secondary | ICD-10-CM | POA: Diagnosis not present

## 2019-06-13 DIAGNOSIS — R410 Disorientation, unspecified: Secondary | ICD-10-CM | POA: Diagnosis not present

## 2019-06-13 DIAGNOSIS — I2510 Atherosclerotic heart disease of native coronary artery without angina pectoris: Secondary | ICD-10-CM | POA: Diagnosis not present

## 2019-06-13 DIAGNOSIS — R1084 Generalized abdominal pain: Secondary | ICD-10-CM | POA: Diagnosis not present

## 2019-06-13 DIAGNOSIS — R52 Pain, unspecified: Secondary | ICD-10-CM | POA: Diagnosis not present

## 2019-06-13 DIAGNOSIS — R1032 Left lower quadrant pain: Secondary | ICD-10-CM | POA: Diagnosis present

## 2019-06-13 DIAGNOSIS — N400 Enlarged prostate without lower urinary tract symptoms: Secondary | ICD-10-CM | POA: Insufficient documentation

## 2019-06-13 DIAGNOSIS — R279 Unspecified lack of coordination: Secondary | ICD-10-CM | POA: Diagnosis not present

## 2019-06-13 LAB — URINALYSIS, ROUTINE W REFLEX MICROSCOPIC
Bilirubin Urine: NEGATIVE
Glucose, UA: NEGATIVE mg/dL
Hgb urine dipstick: NEGATIVE
Ketones, ur: NEGATIVE mg/dL
Leukocytes,Ua: NEGATIVE
Nitrite: NEGATIVE
Protein, ur: NEGATIVE mg/dL
Specific Gravity, Urine: 1.008 (ref 1.005–1.030)
pH: 8 (ref 5.0–8.0)

## 2019-06-13 LAB — CBC
HCT: 43.5 % (ref 39.0–52.0)
Hemoglobin: 14 g/dL (ref 13.0–17.0)
MCH: 30.4 pg (ref 26.0–34.0)
MCHC: 32.2 g/dL (ref 30.0–36.0)
MCV: 94.6 fL (ref 80.0–100.0)
Platelets: 205 10*3/uL (ref 150–400)
RBC: 4.6 MIL/uL (ref 4.22–5.81)
RDW: 14.2 % (ref 11.5–15.5)
WBC: 7.4 10*3/uL (ref 4.0–10.5)
nRBC: 0 % (ref 0.0–0.2)

## 2019-06-13 LAB — COMPREHENSIVE METABOLIC PANEL
ALT: 25 U/L (ref 0–44)
AST: 30 U/L (ref 15–41)
Albumin: 4.2 g/dL (ref 3.5–5.0)
Alkaline Phosphatase: 53 U/L (ref 38–126)
Anion gap: 12 (ref 5–15)
BUN: 22 mg/dL (ref 8–23)
CO2: 26 mmol/L (ref 22–32)
Calcium: 9.5 mg/dL (ref 8.9–10.3)
Chloride: 105 mmol/L (ref 98–111)
Creatinine, Ser: 1.16 mg/dL (ref 0.61–1.24)
GFR calc Af Amer: >60 mL/min (ref >60)
GFR calc non Af Amer: 54 mL/min — ABNORMAL LOW (ref >60)
Glucose, Bld: 96 mg/dL (ref 70–99)
Potassium: 4.3 mmol/L (ref 3.5–5.1)
Sodium: 143 mmol/L (ref 135–145)
Total Bilirubin: 1.2 mg/dL (ref 0.3–1.2)
Total Protein: 7.6 g/dL (ref 6.5–8.1)

## 2019-06-13 LAB — LIPASE, BLOOD: Lipase: 31 U/L (ref 11–51)

## 2019-06-13 MED ORDER — BISACODYL 10 MG RE SUPP: 10.0000 mg | RECTAL | 1 refills | Status: AC | PRN

## 2019-06-13 MED ORDER — SODIUM CHLORIDE 0.9% FLUSH: 3.0000 mL | Freq: Once | INTRAVENOUS | Status: DC

## 2019-06-13 MED ORDER — SODIUM CHLORIDE (PF) 0.9 % IJ SOLN
INTRAMUSCULAR | Status: AC
  Filled 2019-06-13: qty 50

## 2019-06-13 MED ORDER — GLYCERIN (LAXATIVE) 2.1 G RE SUPP
1.0000 | Freq: Once | RECTAL | Status: AC
  Administered 2019-06-13: 1 via RECTAL
  Filled 2019-06-13: qty 1

## 2019-06-13 MED ORDER — POLYETHYLENE GLYCOL 3350 17 G PO PACK
17.0000 g | PACK | Freq: Every day | ORAL | 1 refills | Status: AC
Start: 2019-06-13 — End: ?

## 2019-06-13 MED ORDER — IOHEXOL 300 MG/ML  SOLN
100.0000 mL | Freq: Once | INTRAMUSCULAR | Status: AC | PRN
  Administered 2019-06-13: 100 mL via INTRAVENOUS

## 2019-06-13 NOTE — ED Provider Notes (Signed)
[facility] Emergency Department Provider Note MRN:  [id number]  Arrival date & time: 06/13/19     Chief Complaint   Abdominal Pain   History of Present Illness   [name] is a 86 y.o. year-old male with a history of CAD, cardiomyopathy, dementia presenting to the ED with chief complaint of abdominal pain.  No bowel movements for 3 days, left lower quadrant abdominal pain today beginning at 2 or 3 PM.  Constant since that time, moderate in severity.  Denies vomiting or diarrhea.  Feels like he is having to strain significantly to have a bowel movement or urinate.  Denies fever.  Review of Systems  A complete 10 system review of systems was obtained and all systems are negative except as noted in the HPI and PMH.   Patient's Health History    Past Medical History:  Diagnosis Date  . CAD (coronary artery disease)    s/p BMS to LCx in 2002, DES to mid-LAD in 2007  . Dizziness and giddiness   . Hypertrophy of prostate without urinary obstruction and other lower urinary tract symptoms (LUTS)   . Ischemic cardiomyopathy   . MI (myocardial infarction) ([place]) 2002  . Mild aortic insufficiency   . Mild mitral regurgitation   . Osteoarthrosis, unspecified whether generalized or localized, unspecified site   . Other and unspecified hyperlipidemia   . Sinus bradycardia   . Skin cancer    "top of his head"  . Thrombocytopenia, unspecified ([place])   . Unspecified essential hypertension     Past Surgical History:  Procedure Laterality Date  . COLONOSCOPY    . CORONARY ANGIOPLASTY  01/07/2001, 2008   2 stents  . ESOPHAGOGASTRODUODENOSCOPY N/A 04/22/2014   Procedure: ESOPHAGOGASTRODUODENOSCOPY (EGD);  Surgeon: Milus Banister, MD;  Location: [facility];  Service: Endoscopy;  Laterality: N/A;  . HIP ARTHROPLASTY Right 2006  . JOINT REPLACEMENT    . MOHS SURGERY     "top of his head"  . TRANSURETHRAL RESECTION OF PROSTATE      Family History  Problem  Relation Age of Onset  . Heart disease Father   . Coronary artery disease Other   . Colon cancer Neg Hx     Social History   Socioeconomic History  . Marital status: Married    Spouse name: [name]  . Number of children: 2  . Years of education: Not on file  . Highest education level: Not on file  Occupational History  . Occupation: Retired    Fish farm manager: RETIRED  Tobacco Use  . Smoking status: Never Smoker  . Smokeless tobacco: Never Used  Substance and Sexual Activity  . Alcohol use: No    Alcohol/week: 0.0 standard drinks  . Drug use: No  . Sexual activity: Not Currently  Other Topics Concern  . Not on file  Social History Narrative   Regular Exercise -  YES         Social Determinants of Health   Financial Resource Strain:   . Difficulty of Paying Living Expenses:   Food Insecurity:   . Worried About Charity fundraiser in the Last Year:   . Arboriculturist in the Last Year:   Transportation Needs:   . Film/video editor (Medical):   [name] Lack of Transportation (Non-Medical):   Physical Activity:   . Days of Exercise per Week:   . Minutes of Exercise per Session:   Stress:   . Feeling of Stress :  Social Connections:   . Frequency of Communication with Friends and Family:   . Frequency of Social Gatherings with Friends and Family:   . Attends Religious Services:   . Active Member of Clubs or Organizations:   . Attends Archivist Meetings:   [name] Marital Status:   Intimate Partner Violence:   . Fear of Current or Ex-Partner:   . Emotionally Abused:   [name] Physically Abused:   . Sexually Abused:      Physical Exam   Vitals:   06/13/19 1809  BP: (!) 183/85  Pulse: 64  Resp: 16  Temp: 97.6 F (36.4 C)  SpO2: 99%    CONSTITUTIONAL: Well-appearing, NAD NEURO:  Alert and oriented x 3, no focal deficits EYES:  eyes equal and reactive ENT/NECK:  no LAD, no JVD CARDIO: Regular rate, well-perfused, normal S1 and S2 PULM:  CTAB no wheezing or  rhonchi GI/GU:  normal bowel sounds, non-distended, mild diffuse tenderness MSK/SPINE:  No gross deformities, no edema SKIN:  no rash, atraumatic PSYCH:  Appropriate speech and behavior  *Additional and/or pertinent findings included in MDM below  Diagnostic and Interventional Summary    EKG Interpretation  Date/Time:    Ventricular Rate:    PR Interval:    QRS Duration:   QT Interval:    QTC Calculation:   R Axis:     Text Interpretation:        Labs Reviewed  COMPREHENSIVE METABOLIC PANEL - Abnormal; Notable for the following components:      Result Value   GFR calc non Af Amer 54 (*)    All other components within normal limits  LIPASE, BLOOD  CBC  URINALYSIS, ROUTINE W REFLEX MICROSCOPIC    CT ABDOMEN PELVIS W CONTRAST  Final Result      Medications  sodium chloride flush (NS) 0.9 % injection 3 mL (has no administration in time range)  sodium chloride (PF) 0.9 % injection (has no administration in time range)  iohexol (OMNIPAQUE) 300 MG/ML solution 100 mL (100 mLs Intravenous Contrast Given 06/13/19 1944)     Procedures  /  Critical Care Fecal disimpaction  Date/Time: 06/13/2019 7:18 PM Performed by: Maudie Flakes, MD Authorized by: Maudie Flakes, MD  Consent: Verbal consent obtained. Risks and benefits: risks, benefits and alternatives were discussed Consent given by: patient Local anesthesia used: no  Anesthesia: Local anesthesia used: no  Sedation: Patient sedated: no  Patient tolerance: patient tolerated the procedure well with no immediate complications Comments: Multiple small solid stool balls located high in the rectal vault, too high to remove.  Stool adhered to the rectal wall removed with sweeping motion.     ED Course and Medical Decision Making  I have reviewed the triage vital signs, the nursing notes, and pertinent available records from the EMR.  Listed above are laboratory and imaging tests that I personally ordered, reviewed,  and interpreted and then considered in my medical decision making (see below for details).      Suspect fecal impaction and constipation causing patient's discomfort.  Will first attempt disimpaction and see if this relieves all symptoms.  Nontoxic, normal vital signs.  See disimpaction attempt documentation above.  CT does not reveal other acute process, labs and urine reassuring.  Patient is appropriate for discharge with more aggressive stool softening regimen at home.  Attempted to reach spouse and daughter to inform them of the plan, no answer.  Barth Kirks. Sedonia Small, [place]  [place] [email or web address]  Final Clinical Impressions(s) / ED Diagnoses     ICD-10-CM   1. Constipation, unspecified constipation type  K59.00     ED Discharge Orders         Ordered    bisacodyl (DULCOLAX) 10 MG suppository  As needed     06/13/19 2047    polyethylene glycol (MIRALAX) 17 g packet  Daily     06/13/19 2048           Discharge Instructions Discussed with and Provided to Patient:     Discharge Instructions     You were evaluated in the Emergency Department and after careful evaluation, we did not find any emergent condition requiring admission or further testing in the hospital.  Your exam/testing today is overall reassuring.  Your symptoms seem to be due to constipation.  Please use the suppository medication once or twice daily for the next few days.  We also recommend taking the MiraLAX medication up to 4 times daily until you achieve soft stool, at which point you can decrease the dose to once or twice daily as needed.  Please return to the Emergency Department if you experience any worsening of your condition.  We encourage you to follow up with a primary care provider.  Thank you for allowing [place] to be a part of your care.       Maudie Flakes, MD 06/13/19 2051

## 2019-06-13 NOTE — ED Triage Notes (Addendum)
Patient is coming from home for abdominal pain: Patient reports onset of 1445 this afternoon. LLQ abdominal pain that has become generalized abdominal pain. Patient reports last BM was about 3 days ago. Denies N/V/D.

## 2019-06-13 NOTE — Discharge Instructions (Signed)
You were evaluated in the Emergency Department and after careful evaluation, we did not find any emergent condition requiring admission or further testing in the hospital.  Your exam/testing today is overall reassuring.  Your symptoms seem to be due to constipation.  Please use the suppository medication once or twice daily for the next few days.  We also recommend taking the MiraLAX medication up to 4 times daily until you achieve soft stool, at which point you can decrease the dose to once or twice daily as needed.  Please return to the Emergency Department if you experience any worsening of your condition.  We encourage you to follow up with a primary care provider.  Thank you for allowing [place] to be a part of your care.

## 2019-06-28 ENCOUNTER — Encounter: Payer: Self-pay | Admitting: Internal Medicine

## 2019-06-28 ENCOUNTER — Other Ambulatory Visit: Payer: Self-pay

## 2019-06-28 ENCOUNTER — Ambulatory Visit (INDEPENDENT_AMBULATORY_CARE_PROVIDER_SITE_OTHER): Payer: Medicare Other | Admitting: Internal Medicine

## 2019-06-28 DIAGNOSIS — F0390 Unspecified dementia without behavioral disturbance: Secondary | ICD-10-CM

## 2019-06-28 DIAGNOSIS — I255 Ischemic cardiomyopathy: Secondary | ICD-10-CM

## 2019-06-28 DIAGNOSIS — R269 Unspecified abnormalities of gait and mobility: Secondary | ICD-10-CM

## 2019-06-28 NOTE — Assessment & Plan Note (Signed)
Chair yoga Walker 

## 2019-06-28 NOTE — Progress Notes (Signed)
Subjective:  Patient ID: [name], male    DOB: 08-26-1923  Age: 86 y.o. MRN: [id number]  CC: No chief complaint on file.   HPI [name] presents for CAD, CHF, memory loss f/u [place] is getting new hearing aids  Outpatient Medications Prior to Visit  Medication Sig Dispense Refill  . aspirin 81 MG EC tablet Take 81 mg by mouth daily.      [name] b complex vitamins tablet Take 1 tablet by mouth daily.      . bisacodyl (DULCOLAX) 10 MG suppository Place 1 suppository (10 mg total) rectally as needed for moderate constipation. 12 suppository 1  . Cholecalciferol 1000 UNITS tablet Take 1,000 Units by mouth daily.      [name] Calcium (STOOL SOFTENER PO) Take 1 capsule by mouth at bedtime.    . finasteride (PROSCAR) 5 MG tablet Take 1 tablet (5 mg total) by mouth daily. 90 tablet 3  . Glucosamine-Chondroit-Vit C-Mn (GLUCOSAMINE 1500 COMPLEX PO) Take 1 tablet by mouth 2 (two) times daily.     . isosorbide mononitrate (IMDUR) 30 MG 24 hr tablet Take 1 tablet (30 mg total) by mouth daily. 90 tablet 3  . nitroGLYCERIN (NITROSTAT) 0.4 MG SL tablet Place 1 tablet (0.4 mg total) under the tongue every 5 (five) minutes as needed for chest pain (Please call your doctor/911 if no improvement after the second dose). 25 tablet 0  . pantoprazole (PROTONIX) 40 MG tablet Take 1 tablet (40 mg total) by mouth daily. 90 tablet 3  . polyethylene glycol (MIRALAX) 17 g packet Take 17 g by mouth daily. 30 each 1  . tamsulosin (FLOMAX) 0.4 MG CAPS capsule Take 1 capsule (0.4 mg total) by mouth daily after breakfast. 90 capsule 3  . vitamin C (ASCORBIC ACID) 500 MG tablet Take 500 mg by mouth daily.    . simvastatin (ZOCOR) 40 MG tablet Take 1 tablet (40 mg total) by mouth at bedtime. 90 tablet 3   No facility-administered medications prior to visit.    ROS: Review of Systems  Constitutional: Negative for appetite change, fatigue and unexpected weight change.  HENT: Positive for hearing loss. Negative  for congestion, nosebleeds, sneezing, sore throat and trouble swallowing.   Eyes: Negative for itching and visual disturbance.  Respiratory: Negative for cough.   Cardiovascular: Negative for chest pain, palpitations and leg swelling.  Gastrointestinal: Negative for abdominal distention, blood in stool, diarrhea and nausea.  Genitourinary: Negative for frequency and hematuria.  Musculoskeletal: Positive for arthralgias and gait problem. Negative for back pain, joint swelling and neck pain.  Skin: Negative for rash.  Neurological: Negative for dizziness, tremors, speech difficulty and weakness.  Psychiatric/Behavioral: Positive for decreased concentration. Negative for agitation, dysphoric mood, sleep disturbance and suicidal ideas. The patient is not nervous/anxious.     Objective:  BP 120/69   Pulse 65   Temp 97.6 F (36.4 C) (Oral)   Ht 5\' 9"  (1.753 m)   Wt 149 lb (67.6 kg)   SpO2 95%   BMI 22.00 kg/m   BP Readings from Last 3 Encounters:  06/28/19 120/69  06/13/19 (!) 193/83  03/13/19 122/70    Wt Readings from Last 3 Encounters:  06/28/19 149 lb (67.6 kg)  03/13/19 144 lb (65.3 kg)  02/27/19 143 lb (64.9 kg)    Physical Exam Constitutional:      General: He is not in acute distress.    Appearance: He is well-developed.     Comments: NAD  Eyes:     Conjunctiva/sclera: Conjunctivae normal.     Pupils: Pupils are equal, round, and reactive to light.  Neck:     Thyroid: No thyromegaly.     Vascular: No JVD.  Cardiovascular:     Rate and Rhythm: Normal rate and regular rhythm.     Heart sounds: Normal heart sounds. No murmur. No friction rub. No gallop.   Pulmonary:     Effort: Pulmonary effort is normal. No respiratory distress.     Breath sounds: Normal breath sounds. No wheezing or rales.  Chest:     Chest wall: No tenderness.  Abdominal:     General: Bowel sounds are normal. There is no distension.     Palpations: Abdomen is soft. There is no mass.      Tenderness: There is no abdominal tenderness. There is no guarding or rebound.  Musculoskeletal:        General: No tenderness. Normal range of motion.     Cervical back: Normal range of motion.  Lymphadenopathy:     Cervical: No cervical adenopathy.  Skin:    General: Skin is warm and dry.     Findings: No rash.  Neurological:     Mental Status: He is alert and oriented to person, place, and time.     Cranial Nerves: No cranial nerve deficit.     Motor: Weakness present. No abnormal muscle tone.     Coordination: Coordination normal.     Gait: Gait abnormal.     Deep Tendon Reflexes: Reflexes are normal and symmetric.  Psychiatric:        Behavior: Behavior normal.        Thought Content: Thought content normal.        Judgment: Judgment normal.    Walker Hard hearing   Lab Results  Component Value Date   WBC 7.4 06/13/2019   HGB 14.0 06/13/2019   HCT 43.5 06/13/2019   PLT 205 06/13/2019   GLUCOSE 96 06/13/2019   CHOL 159 04/22/2017   TRIG 68 04/22/2017   HDL 72 04/22/2017   LDLCALC 73 04/22/2017   ALT 25 06/13/2019   AST 30 06/13/2019   NA 143 06/13/2019   K 4.3 06/13/2019   CL 105 06/13/2019   CREATININE 1.16 06/13/2019   BUN 22 06/13/2019   CO2 26 06/13/2019   TSH 3.57 12/15/2017   PSA 6.45 (H) 08/07/2013   INR 1.1 08/02/2018   HGBA1C 6.4 12/05/2012    CT ABDOMEN PELVIS W CONTRAST  Result Date: 06/13/2019 CLINICAL DATA:  Diffuse acute abdominal pain more focally in the left lower quadrant EXAM: CT ABDOMEN AND PELVIS WITH CONTRAST TECHNIQUE: Multidetector CT imaging of the abdomen and pelvis was performed using the standard protocol following bolus administration of intravenous contrast. CONTRAST:  171mL OMNIPAQUE IOHEXOL 300 MG/ML  SOLN COMPARISON:  CT 08/02/2018 FINDINGS: Lower chest: Chronic reticular basilar opacities are similar to prior with some dependent ground-glass, likely atelectasis. No focal consolidation. No visible pleural effusion. Borderline  cardiomegaly with biatrial enlargement. No pericardial effusion. Extensive three-vessel coronary artery disease. Calcifications of the distal thoracic aorta. Hepatobiliary: No focal liver abnormality is seen. No gallstones, gallbladder wall thickening, or biliary dilatation. Pancreas: Few small flat a clefts of the pancreas. No pancreatic ductal dilatation or surrounding inflammatory changes. Spleen: Normal in size without focal abnormality. Adrenals/Urinary Tract: Stable mild thickening of the adrenal glands may reflect some senescent hyperplasia. No concerning adrenal mass or nodule. Kidneys enhance and excrete symmetrically. Multiple fluid attenuation  renal cysts seen bilaterally. Larger 2.7 cm cyst in the upper pole with some thin peripheral calcification is similar to prior (Bosniak 2, 2/26). Additional subcentimeter hypoattenuating foci in both kidneys too small to fully characterize on CT imaging but statistically likely benign. Bilateral extrarenal pelves without hydronephrosis. No visible obstructive urolithiasis. No gross bladder abnormality aside from indentation of bladder base by an enlarged prostate. Portions obscured by streak artifact from right hip prosthesis. Stomach/Bowel: Distal esophagus, stomach and duodenal sweep are unremarkable. No small bowel wall thickening or dilatation. No evidence of obstruction. Normal appendix curls about the cecal tip. Moderate colonic stool burden with a redundancy of the hepatic and splenic flexures. Question some mild colonic thickening versus underdistention particularly about the splenic flexure with some more focal hazy mesenteric stranding in this location. Scattered colonic diverticula without focal pericolonic inflammation to suggest diverticulitis. Inspissated rectal stool ball is noted Vascular/Lymphatic: Extensive atherosclerotic calcification of the abdominal aorta and branch vessels without aneurysm or ectasia. No visible proximal occlusions on this non  angiographic exam. No enlarged abdominopelvic nodes. Reproductive: Borderline prostatomegaly with few benign coarse eccentric calcifications. Seminal vesicles are unremarkable. Other: No abdominopelvic free air or fluid. Some or focal hazy mesenteric stranding adjacent the splenic flexure. No bowel containing hernias. Musculoskeletal: Prior total right hip arthroplasty which is in expected alignment without acute complication or evidence of hardware failure. Multilevel degenerative changes are present in the imaged portions of the spine. No acute osseous abnormality or suspicious osseous lesion. Stable grade 1 anterolisthesis of L4 on L5. No associated spondylolysis. IMPRESSION: 1. Moderate colonic stool burden with a redundancy of the hepatic and splenic flexures. Question some mild colonic thickening versus underdistention particularly about the splenic flexure with some more focal hazy mesenteric stranding in this location. Findings could represent a mild colitis. 2. Inspissated rectal stool ball is noted. Correlate for symptoms of impaction. 3. Colonic diverticulosis without convincing focal evidence of diverticulitis or a culprit diverticulum. 4. Extensive three-vessel coronary artery disease. 5. Borderline cardiomegaly with biatrial enlargement. 6. Stable grade 1 anterolisthesis of L4 on L5. No associated spondylolysis. 7. Mild prostatomegaly. 8. Aortic Atherosclerosis (ICD10-I70.0). Electronically Signed   By: Lovena Le M.D.   On: 06/13/2019 20:17    Assessment & Plan:    Walker Kehr, MD

## 2019-06-28 NOTE — Assessment & Plan Note (Addendum)
Stable memory loss.

## 2019-10-29 ENCOUNTER — Other Ambulatory Visit: Payer: Self-pay

## 2019-10-29 ENCOUNTER — Ambulatory Visit (INDEPENDENT_AMBULATORY_CARE_PROVIDER_SITE_OTHER): Payer: Medicare Other | Admitting: Internal Medicine

## 2019-10-29 ENCOUNTER — Encounter: Payer: Self-pay | Admitting: Internal Medicine

## 2019-10-29 DIAGNOSIS — R269 Unspecified abnormalities of gait and mobility: Secondary | ICD-10-CM | POA: Diagnosis not present

## 2019-10-29 DIAGNOSIS — I10 Essential (primary) hypertension: Secondary | ICD-10-CM | POA: Diagnosis not present

## 2019-10-29 DIAGNOSIS — I255 Ischemic cardiomyopathy: Secondary | ICD-10-CM | POA: Diagnosis not present

## 2019-10-29 DIAGNOSIS — F0390 Unspecified dementia without behavioral disturbance: Secondary | ICD-10-CM

## 2019-10-29 DIAGNOSIS — I5020 Unspecified systolic (congestive) heart failure: Secondary | ICD-10-CM

## 2019-10-29 MED ORDER — SIMVASTATIN 40 MG PO TABS: 40.0000 mg | ORAL_TABLET | Freq: Every day | ORAL | 3 refills | Status: AC

## 2019-10-29 NOTE — Assessment & Plan Note (Signed)
Chair yoga Gilford Rile

## 2019-10-29 NOTE — Progress Notes (Signed)
Subjective:  Patient ID: [name], male    DOB: [birth year]  Age: 86 y.o. MRN: [id number]  CC: No chief complaint on file.   HPI [name] presents for CAD, BPH, dyslipidemia. C/o being lonely [name] is at the Rehab  Outpatient Medications Prior to Visit  Medication Sig Dispense Refill  . aspirin 81 MG EC tablet Take 81 mg by mouth daily.      [name] b complex vitamins tablet Take 1 tablet by mouth daily.      . bisacodyl (DULCOLAX) 10 MG suppository Place 1 suppository (10 mg total) rectally as needed for moderate constipation. 12 suppository 1  . Cholecalciferol 1000 UNITS tablet Take 1,000 Units by mouth daily.      [name] Calcium (STOOL SOFTENER PO) Take 1 capsule by mouth at bedtime.    . finasteride (PROSCAR) 5 MG tablet Take 1 tablet (5 mg total) by mouth daily. 90 tablet 3  . Glucosamine-Chondroit-Vit C-Mn (GLUCOSAMINE 1500 COMPLEX PO) Take 1 tablet by mouth 2 (two) times daily.     . isosorbide mononitrate (IMDUR) 30 MG 24 hr tablet Take 1 tablet (30 mg total) by mouth daily. 90 tablet 3  . nitroGLYCERIN (NITROSTAT) 0.4 MG SL tablet Place 1 tablet (0.4 mg total) under the tongue every 5 (five) minutes as needed for chest pain (Please call your doctor/911 if no improvement after the second dose). 25 tablet 0  . pantoprazole (PROTONIX) 40 MG tablet Take 1 tablet (40 mg total) by mouth daily. 90 tablet 3  . polyethylene glycol (MIRALAX) 17 g packet Take 17 g by mouth daily. 30 each 1  . tamsulosin (FLOMAX) 0.4 MG CAPS capsule Take 1 capsule (0.4 mg total) by mouth daily after breakfast. 90 capsule 3  . vitamin C (ASCORBIC ACID) 500 MG tablet Take 500 mg by mouth daily.    . simvastatin (ZOCOR) 40 MG tablet Take 1 tablet (40 mg total) by mouth at bedtime. 90 tablet 3   No facility-administered medications prior to visit.    ROS: Review of Systems  Constitutional: Negative for appetite change, fatigue and unexpected weight change.  HENT: Negative for congestion,  nosebleeds, sneezing, sore throat and trouble swallowing.   Eyes: Negative for itching and visual disturbance.  Respiratory: Negative for cough.   Cardiovascular: Negative for chest pain, palpitations and leg swelling.  Gastrointestinal: Negative for abdominal distention, blood in stool, diarrhea and nausea.  Genitourinary: Negative for frequency and hematuria.  Musculoskeletal: Positive for gait problem. Negative for back pain, joint swelling and neck pain.  Skin: Negative for rash.  Neurological: Negative for dizziness, tremors, speech difficulty and weakness.  Psychiatric/Behavioral: Positive for decreased concentration and dysphoric mood. Negative for agitation and sleep disturbance. The patient is not nervous/anxious.     Objective:  BP (!) 102/58 (BP Location: Right Arm, Patient Position: Sitting, Cuff Size: Normal)   Pulse 71   Temp 97.6 F (36.4 C) (Oral)   Ht 5\' 9"  (1.753 m)   Wt 149 lb (67.6 kg)   SpO2 96%   BMI 22.00 kg/m   BP Readings from Last 3 Encounters:  10/29/19 (!) 102/58  06/28/19 120/69  06/13/19 (!) 193/83    Wt Readings from Last 3 Encounters:  10/29/19 149 lb (67.6 kg)  06/28/19 149 lb (67.6 kg)  03/13/19 144 lb (65.3 kg)    Physical Exam Constitutional:      General: He is not in acute distress.    Appearance: He is well-developed.  Comments: NAD  Eyes:     Conjunctiva/sclera: Conjunctivae normal.     Pupils: Pupils are equal, round, and reactive to light.  Neck:     Thyroid: No thyromegaly.     Vascular: No JVD.  Cardiovascular:     Rate and Rhythm: Normal rate and regular rhythm.     Heart sounds: Normal heart sounds. No murmur heard.  No friction rub. No gallop.   Pulmonary:     Effort: Pulmonary effort is normal. No respiratory distress.     Breath sounds: Normal breath sounds. No wheezing or rales.  Chest:     Chest wall: No tenderness.  Abdominal:     General: Bowel sounds are normal. There is no distension.     Palpations:  Abdomen is soft. There is no mass.     Tenderness: There is no abdominal tenderness. There is no guarding or rebound.  Musculoskeletal:        General: No tenderness. Normal range of motion.     Cervical back: Normal range of motion.  Lymphadenopathy:     Cervical: No cervical adenopathy.  Skin:    General: Skin is warm and dry.     Findings: No rash.  Neurological:     Mental Status: He is alert and oriented to person, place, and time.     Cranial Nerves: No cranial nerve deficit.     Motor: No abnormal muscle tone.     Coordination: Coordination abnormal.     Gait: Gait normal.     Deep Tendon Reflexes: Reflexes are normal and symmetric.  Psychiatric:        Behavior: Behavior normal.        Thought Content: Thought content normal.        Judgment: Judgment normal.    sad Lab Results  Component Value Date   WBC 7.4 06/13/2019   HGB 14.0 06/13/2019   HCT 43.5 06/13/2019   PLT 205 06/13/2019   GLUCOSE 96 06/13/2019   CHOL 159 04/22/2017   TRIG 68 04/22/2017   HDL 72 04/22/2017   LDLCALC 73 04/22/2017   ALT 25 06/13/2019   AST 30 06/13/2019   NA 143 06/13/2019   K 4.3 06/13/2019   CL 105 06/13/2019   CREATININE 1.16 06/13/2019   BUN 22 06/13/2019   CO2 26 06/13/2019   TSH 3.57 12/15/2017   PSA 6.45 (H) 08/07/2013   INR 1.1 08/02/2018   HGBA1C 6.4 12/05/2012    CT ABDOMEN PELVIS W CONTRAST  Result Date: 06/13/2019 CLINICAL DATA:  Diffuse acute abdominal pain more focally in the left lower quadrant EXAM: CT ABDOMEN AND PELVIS WITH CONTRAST TECHNIQUE: Multidetector CT imaging of the abdomen and pelvis was performed using the standard protocol following bolus administration of intravenous contrast. CONTRAST:  145mL OMNIPAQUE IOHEXOL 300 MG/ML  SOLN COMPARISON:  CT 08/02/2018 FINDINGS: Lower chest: Chronic reticular basilar opacities are similar to prior with some dependent ground-glass, likely atelectasis. No focal consolidation. No visible pleural effusion. Borderline  cardiomegaly with biatrial enlargement. No pericardial effusion. Extensive three-vessel coronary artery disease. Calcifications of the distal thoracic aorta. Hepatobiliary: No focal liver abnormality is seen. No gallstones, gallbladder wall thickening, or biliary dilatation. Pancreas: Few small flat a clefts of the pancreas. No pancreatic ductal dilatation or surrounding inflammatory changes. Spleen: Normal in size without focal abnormality. Adrenals/Urinary Tract: Stable mild thickening of the adrenal glands may reflect some senescent hyperplasia. No concerning adrenal mass or nodule. Kidneys enhance and excrete symmetrically. Multiple fluid attenuation renal  cysts seen bilaterally. Larger 2.7 cm cyst in the upper pole with some thin peripheral calcification is similar to prior (Bosniak 2, 2/26). Additional subcentimeter hypoattenuating foci in both kidneys too small to fully characterize on CT imaging but statistically likely benign. Bilateral extrarenal pelves without hydronephrosis. No visible obstructive urolithiasis. No gross bladder abnormality aside from indentation of bladder base by an enlarged prostate. Portions obscured by streak artifact from right hip prosthesis. Stomach/Bowel: Distal esophagus, stomach and duodenal sweep are unremarkable. No small bowel wall thickening or dilatation. No evidence of obstruction. Normal appendix curls about the cecal tip. Moderate colonic stool burden with a redundancy of the hepatic and splenic flexures. Question some mild colonic thickening versus underdistention particularly about the splenic flexure with some more focal hazy mesenteric stranding in this location. Scattered colonic diverticula without focal pericolonic inflammation to suggest diverticulitis. Inspissated rectal stool ball is noted Vascular/Lymphatic: Extensive atherosclerotic calcification of the abdominal aorta and branch vessels without aneurysm or ectasia. No visible proximal occlusions on this non  angiographic exam. No enlarged abdominopelvic nodes. Reproductive: Borderline prostatomegaly with few benign coarse eccentric calcifications. Seminal vesicles are unremarkable. Other: No abdominopelvic free air or fluid. Some or focal hazy mesenteric stranding adjacent the splenic flexure. No bowel containing hernias. Musculoskeletal: Prior total right hip arthroplasty which is in expected alignment without acute complication or evidence of hardware failure. Multilevel degenerative changes are present in the imaged portions of the spine. No acute osseous abnormality or suspicious osseous lesion. Stable grade 1 anterolisthesis of L4 on L5. No associated spondylolysis. IMPRESSION: 1. Moderate colonic stool burden with a redundancy of the hepatic and splenic flexures. Question some mild colonic thickening versus underdistention particularly about the splenic flexure with some more focal hazy mesenteric stranding in this location. Findings could represent a mild colitis. 2. Inspissated rectal stool ball is noted. Correlate for symptoms of impaction. 3. Colonic diverticulosis without convincing focal evidence of diverticulitis or a culprit diverticulum. 4. Extensive three-vessel coronary artery disease. 5. Borderline cardiomegaly with biatrial enlargement. 6. Stable grade 1 anterolisthesis of L4 on L5. No associated spondylolysis. 7. Mild prostatomegaly. 8. Aortic Atherosclerosis (ICD10-I70.0). Electronically Signed   By: Lovena Le M.D.   On: 06/13/2019 20:17    Assessment & Plan:

## 2019-10-29 NOTE — Addendum Note (Signed)
Addended by: Cresenciano Lick on: 10/29/2019 02:02 PM   Modules accepted: Orders

## 2019-10-29 NOTE — Assessment & Plan Note (Signed)
Off BP meds 

## 2019-10-29 NOTE — Patient Instructions (Signed)
You can try Lion's Mane Mushroom extract or capsules for memory   

## 2019-10-29 NOTE — Assessment & Plan Note (Addendum)
Stable memory loss try Lion's Mane Mushroom extract or capsules for memory

## 2019-10-29 NOTE — Assessment & Plan Note (Signed)
No relapse 

## 2019-10-30 LAB — COMPLETE METABOLIC PANEL WITH GFR
AG Ratio: 1.4 (calc) (ref 1.0–2.5)
ALT: 14 U/L (ref 9–46)
AST: 22 U/L (ref 10–35)
Albumin: 3.9 g/dL (ref 3.6–5.1)
Alkaline phosphatase (APISO): 47 U/L (ref 35–144)
BUN/Creatinine Ratio: 17 (calc) (ref 6–22)
BUN: 22 mg/dL (ref 7–25)
CO2: 25 mmol/L (ref 20–32)
Calcium: 9.2 mg/dL (ref 8.6–10.3)
Chloride: 103 mmol/L (ref 98–110)
Creat: 1.26 mg/dL — ABNORMAL HIGH (ref 0.70–1.11)
GFR, Est African American: 56 mL/min/{1.73_m2} — ABNORMAL LOW (ref >=60)
GFR, Est Non African American: 49 mL/min/{1.73_m2} — ABNORMAL LOW (ref >=60)
Globulin: 2.8 g/dL (calc) (ref 1.9–3.7)
Glucose, Bld: 226 mg/dL — ABNORMAL HIGH (ref 65–99)
Potassium: 4.7 mmol/L (ref 3.5–5.3)
Sodium: 138 mmol/L (ref 135–146)
Total Bilirubin: 0.9 mg/dL (ref 0.2–1.2)
Total Protein: 6.7 g/dL (ref 6.1–8.1)

## 2019-11-09 DIAGNOSIS — Z23 Encounter for immunization: Secondary | ICD-10-CM | POA: Diagnosis not present

## 2019-11-14 ENCOUNTER — Telehealth: Payer: Self-pay | Admitting: Internal Medicine

## 2019-11-14 NOTE — Telephone Encounter (Signed)
MD is out of the office until 10/21. Pt needs ov for evaluation. appt made for 11/16/19 w/Dr. Jenny Reichmann...[name]

## 2019-11-14 NOTE — Telephone Encounter (Signed)
Patients daughter called and said that the patient has red spot on his bottom that goes around his side, it is cool to the tough.   Please call the patients daughter back: [phone]

## 2019-11-14 NOTE — Telephone Encounter (Signed)
Daughter is not on DPR.

## 2019-11-16 ENCOUNTER — Other Ambulatory Visit: Payer: Self-pay

## 2019-11-16 ENCOUNTER — Encounter: Payer: Self-pay | Admitting: Internal Medicine

## 2019-11-16 ENCOUNTER — Ambulatory Visit (INDEPENDENT_AMBULATORY_CARE_PROVIDER_SITE_OTHER): Payer: Medicare Other | Admitting: Internal Medicine

## 2019-11-16 VITALS — BP 110/60 | HR 62 | Temp 97.7°F | Ht 69.0 in | Wt 151.1 lb

## 2019-11-16 DIAGNOSIS — Z23 Encounter for immunization: Secondary | ICD-10-CM

## 2019-11-16 DIAGNOSIS — R21 Rash and other nonspecific skin eruption: Secondary | ICD-10-CM

## 2019-11-16 DIAGNOSIS — I10 Essential (primary) hypertension: Secondary | ICD-10-CM | POA: Diagnosis not present

## 2019-11-16 DIAGNOSIS — I255 Ischemic cardiomyopathy: Secondary | ICD-10-CM | POA: Diagnosis not present

## 2019-11-16 DIAGNOSIS — F0390 Unspecified dementia without behavioral disturbance: Secondary | ICD-10-CM

## 2019-11-16 DIAGNOSIS — I5042 Chronic combined systolic (congestive) and diastolic (congestive) heart failure: Secondary | ICD-10-CM | POA: Diagnosis not present

## 2019-11-16 NOTE — Patient Instructions (Signed)
You had the flu shot today  Ok to use the hydrocortisone cream 2.5 % as you mentioned twice per day  Please continue all other medications as before, and refills have been done if requested.  Please have the pharmacy call with any other refills you may need.  Please keep your appointments with your specialists as you may have planned

## 2019-11-16 NOTE — Progress Notes (Signed)
Subjective:    Patient ID: [name], male    DOB: 1923-06-12, 86 y.o.   MRN: [id number]  HPI  Here with family with rather large painles erythem rash to left buttock without ulcer, pain or d/c.  Has been getting some better with his wife hydrocortisone cream, not sure it got started.  Denies fever, chills,  Denies urinary symptoms such as dysuria, frequency, urgency, flank pain, hematuria or n/v, fever, chills.  Pt denies new neurological symptoms such as new headache, or facial or extremity weakness or numbness   Pt denies polydipsia, polyuria  Dementia overall stable symptomatically, and not assoc with behavioral changes such as hallucinations, paranoia, or agitation. conts to reside at [employer]  .Pt denies chest pain, increased sob or doe, wheezing, orthopnea, PND, increased LE swelling, palpitations, dizziness or syncope. Past Medical History:  Diagnosis Date  . CAD (coronary artery disease)    s/p BMS to LCx in 2002, DES to mid-LAD in 2007  . Dizziness and giddiness   . Hypertrophy of prostate without urinary obstruction and other lower urinary tract symptoms (LUTS)   . Ischemic cardiomyopathy   . MI (myocardial infarction) ([place]) 2002  . Mild aortic insufficiency   . Mild mitral regurgitation   . Osteoarthrosis, unspecified whether generalized or localized, unspecified site   . Other and unspecified hyperlipidemia   . Sinus bradycardia   . Skin cancer    "top of his head"  . Thrombocytopenia, unspecified ([place])   . Unspecified essential hypertension    Past Surgical History:  Procedure Laterality Date  . COLONOSCOPY    . CORONARY ANGIOPLASTY  01/07/2001, 2008   2 stents  . ESOPHAGOGASTRODUODENOSCOPY N/A 04/22/2014   Procedure: ESOPHAGOGASTRODUODENOSCOPY (EGD);  Surgeon: Milus Banister, MD;  Location: [facility];  Service: Endoscopy;  Laterality: N/A;  . HIP ARTHROPLASTY Right 2006  . JOINT REPLACEMENT    . MOHS SURGERY     "top of his head"  . TRANSURETHRAL RESECTION  OF PROSTATE      reports that he has never smoked. He has never used smokeless tobacco. He reports that he does not drink alcohol and does not use drugs. family history includes Coronary artery disease in an other family member; Heart disease in his father. Allergies  Allergen Reactions  . Oxycodone Nausea And Vomiting  . Penicillins Hives    Did it involve swelling of the face/tongue/throat, SOB, or low BP? Unknown Did it involve sudden or severe rash/hives, skin peeling, or any reaction on the inside of your mouth or nose? Yes Did you need to seek medical attention at a hospital or doctor's office? Unknown When did it last happen?86 yrs old If all above answers are "NO", may proceed with cephalosporin use.   Current Outpatient Medications on File Prior to Visit  Medication Sig Dispense Refill  . aspirin 81 MG EC tablet Take 81 mg by mouth daily.      [name] b complex vitamins tablet Take 1 tablet by mouth daily.      . bisacodyl (DULCOLAX) 10 MG suppository Place 1 suppository (10 mg total) rectally as needed for moderate constipation. 12 suppository 1  . Cholecalciferol 1000 UNITS tablet Take 1,000 Units by mouth daily.      [name] Calcium (STOOL SOFTENER PO) Take 1 capsule by mouth at bedtime.    . finasteride (PROSCAR) 5 MG tablet Take 1 tablet (5 mg total) by mouth daily. 90 tablet 3  . Glucosamine-Chondroit-Vit C-Mn (GLUCOSAMINE 1500 COMPLEX PO) Take  1 tablet by mouth 2 (two) times daily.     . isosorbide mononitrate (IMDUR) 30 MG 24 hr tablet Take 1 tablet (30 mg total) by mouth daily. 90 tablet 3  . nitroGLYCERIN (NITROSTAT) 0.4 MG SL tablet Place 1 tablet (0.4 mg total) under the tongue every 5 (five) minutes as needed for chest pain (Please call your doctor/911 if no improvement after the second dose). 25 tablet 0  . pantoprazole (PROTONIX) 40 MG tablet Take 1 tablet (40 mg total) by mouth daily. 90 tablet 3  . polyethylene glycol (MIRALAX) 17 g packet Take 17 g by mouth  daily. 30 each 1  . simvastatin (ZOCOR) 40 MG tablet Take 1 tablet (40 mg total) by mouth at bedtime. 90 tablet 3  . tamsulosin (FLOMAX) 0.4 MG CAPS capsule Take 1 capsule (0.4 mg total) by mouth daily after breakfast. 90 capsule 3  . vitamin C (ASCORBIC ACID) 500 MG tablet Take 500 mg by mouth daily.     No current facility-administered medications on file prior to visit.   Review of Systems All otherwise neg per pt    Objective:   Physical Exam BP 110/60 (BP Location: Left Arm, Patient Position: Sitting, Cuff Size: Large)   Pulse 62   Temp 97.7 F (36.5 C) (Oral)   Ht 5\' 9"  (1.753 m)   Wt 151 lb 2 oz (68.5 kg)   SpO2 95%   BMI 22.32 kg/m  VS noted,  Constitutional: Pt appears in NAD HENT: Head: NCAT.  Right Ear: External ear normal.  Left Ear: External ear normal.  Eyes: . Pupils are equal, round, and reactive to light. Conjunctivae and EOM are normal Nose: without d/c or deformity Neck: Neck supple. Gross normal ROM Cardiovascular: Normal rate and regular rhythm.   Pulmonary/Chest: Effort normal and breath sounds without rales or wheezing.  Left buttock area with 8 x 4 cm area erythem rash with some scaliness nontender no ulcer or drainage Neurological: Pt is alert. At baseline orientation, motor grossly intact Skin: Skin is warm. No rashes, other new lesions, no LE edema Psychiatric: Pt behavior is normal without agitation  All otherwise neg per pt Lab Results  Component Value Date   WBC 7.4 06/13/2019   HGB 14.0 06/13/2019   HCT 43.5 06/13/2019   PLT 205 06/13/2019   GLUCOSE 226 (H) 10/29/2019   CHOL 159 04/22/2017   TRIG 68 04/22/2017   HDL 72 04/22/2017   LDLCALC 73 04/22/2017   ALT 14 10/29/2019   AST 22 10/29/2019   NA 138 10/29/2019   K 4.7 10/29/2019   CL 103 10/29/2019   CREATININE 1.26 (H) 10/29/2019   BUN 22 10/29/2019   CO2 25 10/29/2019   TSH 3.57 12/15/2017   PSA 6.45 (H) 08/07/2013   INR 1.1 08/02/2018   HGBA1C 6.4 12/05/2012        Assessment & Plan:

## 2019-11-18 ENCOUNTER — Encounter: Payer: Self-pay | Admitting: Internal Medicine

## 2019-11-18 DIAGNOSIS — R21 Rash and other nonspecific skin eruption: Secondary | ICD-10-CM | POA: Insufficient documentation

## 2019-11-18 NOTE — Assessment & Plan Note (Signed)
stable overall by history and exam, recent data reviewed with pt, and pt to continue medical treatment as before,  to f/u any worsening symptoms or concerns  

## 2019-11-18 NOTE — Assessment & Plan Note (Addendum)
I suspect inflammatry contact dermatitis like, and already improving with wife hydrocort cr 2.5% - ok to continue this until improved  I spent 31 minutes in preparing to see the patient by review of recent labs, imaging and procedures, obtaining and reviewing separately obtained history, communicating with the patient and family or caregiver, ordering medications, tests or procedures, and documenting clinical information in the EHR including the differential Dx, treatment, and any further evaluation and other management of rash, chf, htn, dementia

## 2019-12-20 ENCOUNTER — Emergency Department (HOSPITAL_COMMUNITY): Payer: Medicare Other

## 2019-12-20 ENCOUNTER — Other Ambulatory Visit: Payer: Self-pay

## 2019-12-20 ENCOUNTER — Encounter (HOSPITAL_COMMUNITY): Payer: Self-pay

## 2019-12-20 ENCOUNTER — Emergency Department (HOSPITAL_COMMUNITY)
Admission: EM | Admit: 2019-12-20 | Discharge: 2019-12-20 | Disposition: A | Discharge status: Home / Self Care | Payer: Medicare Other | Attending: Emergency Medicine | Admitting: Emergency Medicine

## 2019-12-20 DIAGNOSIS — Z96641 Presence of right artificial hip joint: Secondary | ICD-10-CM | POA: Diagnosis not present

## 2019-12-20 DIAGNOSIS — F0390 Unspecified dementia without behavioral disturbance: Secondary | ICD-10-CM | POA: Diagnosis not present

## 2019-12-20 DIAGNOSIS — Z7982 Long term (current) use of aspirin: Secondary | ICD-10-CM | POA: Insufficient documentation

## 2019-12-20 DIAGNOSIS — M479 Spondylosis, unspecified: Secondary | ICD-10-CM | POA: Diagnosis not present

## 2019-12-20 DIAGNOSIS — I2510 Atherosclerotic heart disease of native coronary artery without angina pectoris: Secondary | ICD-10-CM | POA: Insufficient documentation

## 2019-12-20 DIAGNOSIS — M549 Dorsalgia, unspecified: Secondary | ICD-10-CM | POA: Insufficient documentation

## 2019-12-20 DIAGNOSIS — M62830 Muscle spasm of back: Secondary | ICD-10-CM

## 2019-12-20 DIAGNOSIS — M4800 Spinal stenosis, site unspecified: Secondary | ICD-10-CM | POA: Insufficient documentation

## 2019-12-20 DIAGNOSIS — I5042 Chronic combined systolic (congestive) and diastolic (congestive) heart failure: Secondary | ICD-10-CM | POA: Insufficient documentation

## 2019-12-20 DIAGNOSIS — I110 Hypertensive heart disease with heart failure: Secondary | ICD-10-CM | POA: Diagnosis not present

## 2019-12-20 DIAGNOSIS — R52 Pain, unspecified: Secondary | ICD-10-CM | POA: Diagnosis not present

## 2019-12-20 DIAGNOSIS — M545 Low back pain, unspecified: Secondary | ICD-10-CM | POA: Diagnosis not present

## 2019-12-20 DIAGNOSIS — M48061 Spinal stenosis, lumbar region without neurogenic claudication: Secondary | ICD-10-CM | POA: Diagnosis not present

## 2019-12-20 DIAGNOSIS — M47816 Spondylosis without myelopathy or radiculopathy, lumbar region: Secondary | ICD-10-CM

## 2019-12-20 LAB — CBC WITH DIFFERENTIAL/PLATELET
Abs Immature Granulocytes: 0.04 10*3/uL (ref 0.00–0.07)
Basophils Absolute: 0 10*3/uL (ref 0.0–0.1)
Basophils Relative: 0 %
Eosinophils Absolute: 0.1 10*3/uL (ref 0.0–0.5)
Eosinophils Relative: 1 %
HCT: 40.9 % (ref 39.0–52.0)
Hemoglobin: 13.6 g/dL (ref 13.0–17.0)
Immature Granulocytes: 0 %
Lymphocytes Relative: 30 %
Lymphs Abs: 2.7 10*3/uL (ref 0.7–4.0)
MCH: 31.3 pg (ref 26.0–34.0)
MCHC: 33.3 g/dL (ref 30.0–36.0)
MCV: 94 fL (ref 80.0–100.0)
Monocytes Absolute: 1.2 10*3/uL — ABNORMAL HIGH (ref 0.1–1.0)
Monocytes Relative: 14 %
Neutro Abs: 4.8 10*3/uL (ref 1.7–7.7)
Neutrophils Relative %: 55 %
Platelets: 178 10*3/uL (ref 150–400)
RBC: 4.35 MIL/uL (ref 4.22–5.81)
RDW: 14.2 % (ref 11.5–15.5)
WBC: 9 10*3/uL (ref 4.0–10.5)
nRBC: 0 % (ref 0.0–0.2)

## 2019-12-20 LAB — URINALYSIS, ROUTINE W REFLEX MICROSCOPIC
Bilirubin Urine: NEGATIVE
Glucose, UA: NEGATIVE mg/dL
Hgb urine dipstick: NEGATIVE
Ketones, ur: NEGATIVE mg/dL
Leukocytes,Ua: NEGATIVE
Nitrite: NEGATIVE
Protein, ur: NEGATIVE mg/dL
Specific Gravity, Urine: 1.006 (ref 1.005–1.030)
pH: 9 — ABNORMAL HIGH (ref 5.0–8.0)

## 2019-12-20 LAB — I-STAT CHEM 8, ED
BUN: 23 mg/dL (ref 8–23)
Calcium, Ion: 1.17 mmol/L (ref 1.15–1.40)
Chloride: 107 mmol/L (ref 98–111)
Creatinine, Ser: 1.2 mg/dL (ref 0.61–1.24)
Glucose, Bld: 102 mg/dL — ABNORMAL HIGH (ref 70–99)
HCT: 41 % (ref 39.0–52.0)
Hemoglobin: 13.9 g/dL (ref 13.0–17.0)
Potassium: 4.1 mmol/L (ref 3.5–5.1)
Sodium: 142 mmol/L (ref 135–145)
TCO2: 21 mmol/L — ABNORMAL LOW (ref 22–32)

## 2019-12-20 MED ORDER — LIDOCAINE 5 % EX PTCH: 1.0000 | MEDICATED_PATCH | CUTANEOUS | 0 refills | Status: AC

## 2019-12-20 MED ORDER — ACETAMINOPHEN 500 MG PO TABS
1000.0000 mg | ORAL_TABLET | Freq: Once | ORAL | Status: AC
  Administered 2019-12-20: 1000 mg via ORAL
  Filled 2019-12-20: qty 2

## 2019-12-20 MED ORDER — LIDOCAINE 5 % EX PTCH
3.0000 | MEDICATED_PATCH | CUTANEOUS | Status: DC
  Administered 2019-12-20: 3 via TRANSDERMAL
  Filled 2019-12-20: qty 3

## 2019-12-20 MED ORDER — MELOXICAM 7.5 MG PO TABS
7.5000 mg | ORAL_TABLET | Freq: Every day | ORAL | 0 refills | Status: DC
Start: 2019-12-20 — End: 2020-02-25

## 2019-12-20 MED ORDER — KETOROLAC TROMETHAMINE 60 MG/2ML IM SOLN
60.0000 mg | Freq: Once | INTRAMUSCULAR | Status: AC
  Administered 2019-12-20: 60 mg via INTRAMUSCULAR
  Filled 2019-12-20: qty 2

## 2019-12-20 NOTE — ED Notes (Signed)
I gave I Stat Chem 8 critical results to MD  [facility]

## 2019-12-20 NOTE — ED Notes (Signed)
Spoke with patient's wife. She said she would have to find someone to pick him up. [name] [phone]

## 2019-12-20 NOTE — ED Provider Notes (Signed)
[facility] DEPT Provider Note   CSN: [id number] Arrival date & time: 12/20/19  0057     History Chief Complaint  Patient presents with  . Back Pain    [name] is a 86 y.o. male.  The history is provided by the patient.  Back Pain Location:  Sacro-iliac joint and lumbar spine Quality:  Cramping Radiates to:  Does not radiate Pain severity:  Severe Pain is:  Same all the time Onset quality:  Sudden Timing:  Constant Progression:  Unchanged Chronicity:  New Context comment:  Rolling over in bed Relieved by:  Nothing Worsened by:  Palpation, twisting, bending and touching Ineffective treatments:  None tried Associated symptoms: no abdominal pain, no abdominal swelling, no bladder incontinence, no bowel incontinence, no chest pain, no dysuria, no fever, no headaches, no leg pain, no numbness, no paresthesias, no pelvic pain, no perianal numbness, no tingling, no weakness and no weight loss   Risk factors: no steroid use        Past Medical History:  Diagnosis Date  . CAD (coronary artery disease)    s/p BMS to LCx in 2002, DES to mid-LAD in 2007  . Dizziness and giddiness   . Hypertrophy of prostate without urinary obstruction and other lower urinary tract symptoms (LUTS)   . Ischemic cardiomyopathy   . MI (myocardial infarction) ([place]) 2002  . Mild aortic insufficiency   . Mild mitral regurgitation   . Osteoarthrosis, unspecified whether generalized or localized, unspecified site   . Other and unspecified hyperlipidemia   . Sinus bradycardia   . Skin cancer    "top of his head"  . Thrombocytopenia, unspecified ([place])   . Unspecified essential hypertension     Patient Active Problem List   Diagnosis Date Noted  . Rash 11/18/2019  . Dementia ([place]) 02/17/2019  . Abdominal pain, acute, epigastric 08/02/2018  . Hypertensive urgency 08/02/2018  . Hyperbilirubinemia 08/02/2018  . Hypertension   . Abdominal pain 07/13/2018  .  Bradycardia 07/13/2018  . Orthostatic dizziness 12/15/2017  . Wrist pain, acute, right 10/14/2017  . Seborrheic keratoses 06/01/2017  . Chest pain 04/22/2017  . Fall 04/22/2017  . Gait disorder 11/03/2016  . Nausea 10/12/2016  . Hearing loss 11/25/2015  . Wart viral 04/28/2015  . 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 Situational depression 08/07/2013  . Well adult exam 08/02/2012  . Nausea with vomiting 03/28/2012  . HYPERGLYCEMIA 02/23/2010  . HYPERKALEMIA 10/21/2009  . Actinic keratosis 10/21/2009  . 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 PSA, INCREASED 04/22/2009  . MELANOMA, SCALP 01/02/2009  . NEOPLASM, SKIN, UNCERTAIN BEHAVIOR 30/10/2328  . CAROTID ARTERY STENOSIS, WITHOUT INFARCTION 05/06/2008  . Congestive heart failure ([place]) 04/29/2008  . Chronic combined systolic and diastolic CHF (congestive heart failure) ([place]) 04/29/2008  . Osteoarthritis 04/29/2008  . VERTIGO 03/11/2008  . Dyslipidemia 04/14/2007  . Essential hypertension 04/14/2007  . Coronary atherosclerosis 04/14/2007  . BPH (benign prostatic hyperplasia) 04/14/2007    Past Surgical History:  Procedure Laterality Date  . COLONOSCOPY    . CORONARY ANGIOPLASTY  01/07/2001, 2008   2 stents  . ESOPHAGOGASTRODUODENOSCOPY N/A 04/22/2014   Procedure: ESOPHAGOGASTRODUODENOSCOPY (EGD);  Surgeon: Milus Banister, MD;  Location: [facility];  Service: Endoscopy;  Laterality: N/A;  . HIP ARTHROPLASTY Right 2006  . JOINT REPLACEMENT    . MOHS SURGERY     "top of his head"  . TRANSURETHRAL RESECTION OF PROSTATE         Family History  Problem Relation Age of Onset  . Heart disease Father   . Coronary artery disease Other   . Colon cancer Neg Hx     Social History   Tobacco Use  . Smoking status: Never Smoker  . Smokeless tobacco: Never Used  Vaping Use  . Vaping Use: Never used  Substance Use Topics  . Alcohol use: No    Alcohol/week: 0.0 standard drinks  . Drug use: No    Home  Medications Prior to Admission medications   Medication Sig Start Date End Date Taking? Authorizing Provider  aspirin 81 MG EC tablet Take 81 mg by mouth daily.     Yes [provider]  b complex vitamins tablet Take 1 tablet by mouth daily.     Yes [provider]  bisacodyl (DULCOLAX) 10 MG suppository Place 1 suppository (10 mg total) rectally as needed for moderate constipation. 06/13/19  Yes Maudie Flakes, MD  Cholecalciferol 1000 UNITS tablet Take 1,000 Units by mouth daily.     Yes [provider]  Docusate Calcium (STOOL SOFTENER PO) Take 1 capsule by mouth at bedtime.   Yes [provider]  finasteride (PROSCAR) 5 MG tablet Take 1 tablet (5 mg total) by mouth daily. 02/27/19  Yes Plotnikov, Evie Lacks, MD  Glucosamine-Chondroit-Vit C-Mn (GLUCOSAMINE 1500 COMPLEX PO) Take 1 tablet by mouth 2 (two) times daily.    Yes [provider]  isosorbide mononitrate (IMDUR) 30 MG 24 hr tablet Take 1 tablet (30 mg total) by mouth daily. 02/27/19  Yes Plotnikov, Evie Lacks, MD  nitroGLYCERIN (NITROSTAT) 0.4 MG SL tablet Place 1 tablet (0.4 mg total) under the tongue every 5 (five) minutes as needed for chest pain (Please call your doctor/911 if no improvement after the second dose). 02/19/19  Yes Plotnikov, Evie Lacks, MD  pantoprazole (PROTONIX) 40 MG tablet Take 1 tablet (40 mg total) by mouth daily. 02/27/19  Yes Plotnikov, Evie Lacks, MD  polyethylene glycol (MIRALAX) 17 g packet Take 17 g by mouth daily. Patient taking differently: Take 17 g by mouth daily as needed for mild constipation.  06/13/19  Yes Maudie Flakes, MD  simvastatin (ZOCOR) 40 MG tablet Take 1 tablet (40 mg total) by mouth at bedtime. 10/29/19 01/27/20 Yes Plotnikov, Evie Lacks, MD  tamsulosin (FLOMAX) 0.4 MG CAPS capsule Take 1 capsule (0.4 mg total) by mouth daily after breakfast. 02/27/19  Yes Plotnikov, Evie Lacks, MD  vitamin C (ASCORBIC ACID) 500 MG tablet Take 500 mg by mouth daily.   Yes  [provider]  lidocaine (LIDODERM) 5 % Place 1 patch onto the skin daily. Remove & Discard patch within 12 hours or as directed by MD 12/20/19   Randal Buba, Talesha Ellithorpe, MD  meloxicam (MOBIC) 7.5 MG tablet Take 1 tablet (7.5 mg total) by mouth daily. 12/20/19   Anahita Cua, MD    Allergies    Oxycodone and Penicillins  Review of Systems   Review of Systems  Constitutional: Negative for fever and weight loss.  HENT: Negative for congestion.   Eyes: Negative for visual disturbance.  Respiratory: Negative for cough.   Cardiovascular: Negative for chest pain.  Gastrointestinal: Negative for abdominal pain and bowel incontinence.  Genitourinary: Negative for bladder incontinence, difficulty urinating, dysuria and pelvic pain.  Musculoskeletal: Positive for back pain.  Neurological: Negative for tingling, weakness, numbness, headaches and paresthesias.  All other systems reviewed and are negative.   Physical Exam Updated Vital Signs BP (!) 168/82 (BP Location: Right Arm)  Pulse (!) 53   Temp 98 F (36.7 C) (Oral)   Resp 20   Ht 5\' 6"  (1.676 m)   Wt 65.8 kg   SpO2 100%   BMI 23.40 kg/m   Physical Exam Vitals and nursing note reviewed.  Constitutional:      General: He is not in acute distress.    Appearance: Normal appearance.  HENT:     Head: Normocephalic and atraumatic.     Nose: Nose normal.  Eyes:     Conjunctiva/sclera: Conjunctivae normal.     Pupils: Pupils are equal, round, and reactive to light.  Cardiovascular:     Rate and Rhythm: Normal rate and regular rhythm.     Pulses: Normal pulses.     Heart sounds: Normal heart sounds.  Pulmonary:     Effort: Pulmonary effort is normal.     Breath sounds: Normal breath sounds.  Abdominal:     General: Abdomen is flat. Bowel sounds are normal.     Palpations: Abdomen is soft.     Tenderness: There is no abdominal tenderness. There is no guarding or rebound.  Musculoskeletal:     Cervical back: Normal,  normal range of motion and neck supple.     Thoracic back: Normal.     Lumbar back: Normal.     Right ankle: Normal.     Right Achilles Tendon: Normal.     Left ankle: Normal.     Left Achilles Tendon: Normal.     Right foot: Normal.     Left foot: Normal.     Comments: Intact l5/s1 intact perineal sensation   Skin:    General: Skin is warm and dry.     Capillary Refill: Capillary refill takes less than 2 seconds.  Neurological:     General: No focal deficit present.     Mental Status: He is alert.     Motor: No weakness.     Deep Tendon Reflexes: Reflexes normal.  Psychiatric:        Mood and Affect: Mood normal.        Behavior: Behavior normal.     ED Results / Procedures / Treatments   Labs (all labs ordered are listed, but only abnormal results are displayed) Labs Reviewed  CBC WITH DIFFERENTIAL/PLATELET - Abnormal; Notable for the following components:      Result Value   Monocytes Absolute 1.2 (*)    All other components within normal limits  I-STAT CHEM 8, ED - Abnormal; Notable for the following components:   Glucose, Bld 102 (*)    TCO2 21 (*)    All other components within normal limits  URINE CULTURE  URINALYSIS, ROUTINE W REFLEX MICROSCOPIC    EKG None  Radiology CT Lumbar Spine Wo Contrast  Result Date: 12/20/2019 CLINICAL DATA:  Initial evaluation for acute lower back pain. EXAM: CT LUMBAR SPINE WITHOUT CONTRAST TECHNIQUE: Multidetector CT imaging of the lumbar spine was performed without intravenous contrast administration. Multiplanar CT image reconstructions were also generated. COMPARISON:  None available. FINDINGS: Segmentation: Standard. Lowest well-formed disc space labeled the L5-S1 level. Alignment: 6 mm anterolisthesis of L4 on L5, with trace 3 mm retrolisthesis of L3 on L4. Findings chronic and facet mediated. Underlying mild levoscoliosis. Alignment otherwise normal with preservation of the normal lumbar lordosis. Vertebrae: Vertebral body  height maintained without acute or chronic fracture. Visualized sacrum and pelvis intact. SI joints approximated symmetric. 1 cm sclerotic focus within the posterior right iliac wing most likely reflects a  benign bone island. No worrisome lytic or blastic osseous lesions. Paraspinal and other soft tissues: Paraspinous soft tissues demonstrate no acute finding. Advanced aorto bi-iliac atherosclerotic disease. No visible aneurysm. Scattered 3 vessel coronary artery calcifications partially visualized. Probable trace layering right pleural effusion. Mild symmetric fullness of the renal collecting systems noted bilaterally without frank hydronephrosis. No obstructive lesion identified. Few scattered left renal cysts noted, largest of which measures 3.1 cm and is partially calcified. Disc levels: T12-L1: Disc desiccation with mild right eccentric disc bulge. Mild right greater than left facet hypertrophy. No significant spinal stenosis. Mild bilateral foraminal narrowing. L1-2: Negative interspace. Mild facet hypertrophy. No significant canal or foraminal stenosis. L2-3: Degenerative intervertebral disc space narrowing with mild disc bulge and disc desiccation. Superimposed broad-based left foraminal to extraforaminal disc protrusion (series 4, image 78). Additional small right foraminal disc protrusion noted as well (series 4, image 80).Mild to moderate bilateral facet hypertrophy. Resultant mild left lateral recess without significant narrowing of the central canal. Mild bilateral L2 foraminal narrowing. L3-4: Degenerative intervertebral disc space narrowing with diffuse disc bulge. Disc bulging slightly eccentric to the right. Moderate bilateral facet hypertrophy. Resultant severe spinal stenosis. Severe right with moderate left L3 foraminal narrowing. L4-5: Anterolisthesis. Associated broad posterior pseudo disc bulge/uncovering. Superimposed right foraminal disc protrusion with associated annular calcification  (series 4, image 110). Severe right worse than left facet hypertrophy. Resultant moderate canal with moderate to severe bilateral subarticular stenosis. Moderate right L4 foraminal narrowing. Left neural foramina remains patent. L5-S1: Degenerative intervertebral disc space narrowing with disc desiccation and diffuse disc bulge. Reactive endplate change with marginal endplate osteophytic spurring, greater on the left. Moderate bilateral facet hypertrophy. No significant canal or lateral recess stenosis. Moderate left L5 foraminal narrowing. No significant right foraminal encroachment. IMPRESSION: 1. No acute abnormality within the lumbar spine. 2. Multifactorial degenerative changes at L3-4 and L4-5 with resultant moderate to severe canal and bilateral subarticular stenosis as above. 3. Moderate to severe bilateral L3, right L4, and left L5 foraminal stenosis related to disc bulge, reactive endplate changes, and facet hypertrophy. 4. Moderate to advanced multilevel facet hypertrophy throughout the lower lumbar spine, most pronounced at L4-5 on the right. Finding could contribute to lower back pain. 5. Advanced aorto bi-iliac atherosclerotic disease with 3 vessel coronary artery calcifications. 6. Probable trace layering right pleural effusion. Aortic Atherosclerosis (ICD10-I70.0). Electronically Signed   By: Jeannine Boga M.D.   On: 12/20/2019 03:47    Procedures Procedures (including critical care time)  Medications Ordered in ED Medications  lidocaine (LIDODERM) 5 % 3 patch (3 patches Transdermal Patch Applied 12/20/19 0241)  acetaminophen (TYLENOL) tablet 1,000 mg (1,000 mg Oral Given 12/20/19 0145)  ketorolac (TORADOL) injection 60 mg (60 mg Intramuscular Given 12/20/19 0325)    ED Course  I have reviewed the triage vital signs and the nursing notes.  Pertinent labs & imaging results that were available during my care of the patient were reviewed by me and considered in my medical decision  making (see chart for details).    Arthritis of the skin with DDD, No acute fractures.  Intact sensation to the perineum, intact motor to BLE without weakness.   Intact distal pulses.  Will prescribe lidoderm and heat therapy and close follow up with PMD.  Strict return precautions given.    DAMONTRE MILLEA was evaluated in Emergency Department on 12/20/2019 for the symptoms described in the history of present illness. He was evaluated in the context of the global COVID-19 pandemic, which  necessitated consideration that the patient might be at risk for infection with the SARS-CoV-2 virus that causes COVID-19. Institutional protocols and algorithms that pertain to the evaluation of patients at risk for COVID-19 are in a state of rapid change based on information released by regulatory bodies including the CDC and federal and state organizations. These policies and algorithms were followed during the patient's care in the ED.  Final Clinical Impression(s) / ED Diagnoses Final diagnoses:  Back spasm  Spinal stenosis, unspecified spinal region  Osteoarthritis of lumbar spine, unspecified spinal osteoarthritis complication status   Return for intractable cough, coughing up blood,fevers >100.4 unrelieved by medication, shortness of breath, intractable vomiting, chest pain, shortness of breath, weakness,numbness, changes in speech, facial asymmetry,abdominal pain, passing out,Inability to tolerate liquids or food, cough, altered mental status or any concerns. No signs of systemic illness or infection. The patient is nontoxic-appearing on exam and vital signs are within normal limits.   I have reviewed the triage vital signs and the nursing notes. Pertinent labs &imaging results that were available during my care of the patient were reviewed by me and considered in my medical decision making (see chart for details).After history, exam, and medical workup I feel the patient has beenappropriately  medically screened and is safe for discharge home. Pertinent diagnoses were discussed with the patient. Patient was given return precautions.   Rx / DC Orders ED Discharge Orders         Ordered    lidocaine (LIDODERM) 5 %  Every 24 hours        12/20/19 0420    meloxicam (MOBIC) 7.5 MG tablet  Daily        12/20/19 0420           Ananias Kolander, MD 12/20/19 [phone]

## 2019-12-20 NOTE — ED Notes (Signed)
Called wife ruth. [name] states only family they have around her is a grand-daughter her she isnt answering and her voicemail is full. Ruth(wife) states she will pay someone to bring him home. Will let charge nurse know

## 2019-12-20 NOTE — ED Notes (Signed)
Asked MD if social worker consult could be placed to help find a ride home

## 2019-12-20 NOTE — Progress Notes (Signed)
TOC CM/CSW was asked to consult with pt on transportation needs.  CSW received a call from ED Secretary/Lisa, pts assisted living facility picked him up.  Alif Petrak Tarpley-Carter, MSW, LCSW-A Pronouns:  She, Her, Hers                  [facility] ED Transitions of CareClinical Social Worker [email or web address] [phone]

## 2019-12-20 NOTE — ED Triage Notes (Signed)
Patient BIB [facility] for back pain/ spasm. EMS reports that patient starting having excruciating back pain starting at 2000 last night. Worse with movement pain 10/10. No recent falls

## 2019-12-20 NOTE — ED Notes (Signed)
[facility] called to take patient home

## 2019-12-21 ENCOUNTER — Encounter (HOSPITAL_COMMUNITY): Payer: Self-pay

## 2019-12-21 ENCOUNTER — Emergency Department (HOSPITAL_COMMUNITY)
Admission: EM | Admit: 2019-12-21 | Discharge: 2019-12-24 | Disposition: A | Discharge status: Home / Self Care | Payer: Medicare Other | Attending: Emergency Medicine | Admitting: Emergency Medicine

## 2019-12-21 DIAGNOSIS — S3992XA Unspecified injury of lower back, initial encounter: Secondary | ICD-10-CM | POA: Diagnosis present

## 2019-12-21 DIAGNOSIS — R52 Pain, unspecified: Secondary | ICD-10-CM | POA: Diagnosis not present

## 2019-12-21 DIAGNOSIS — M549 Dorsalgia, unspecified: Secondary | ICD-10-CM | POA: Diagnosis not present

## 2019-12-21 DIAGNOSIS — Z20822 Contact with and (suspected) exposure to covid-19: Secondary | ICD-10-CM | POA: Diagnosis not present

## 2019-12-21 DIAGNOSIS — W19XXXA Unspecified fall, initial encounter: Secondary | ICD-10-CM | POA: Insufficient documentation

## 2019-12-21 DIAGNOSIS — I2510 Atherosclerotic heart disease of native coronary artery without angina pectoris: Secondary | ICD-10-CM | POA: Diagnosis not present

## 2019-12-21 DIAGNOSIS — S39012A Strain of muscle, fascia and tendon of lower back, initial encounter: Secondary | ICD-10-CM | POA: Diagnosis not present

## 2019-12-21 DIAGNOSIS — F0390 Unspecified dementia without behavioral disturbance: Secondary | ICD-10-CM | POA: Diagnosis not present

## 2019-12-21 DIAGNOSIS — M545 Low back pain, unspecified: Secondary | ICD-10-CM

## 2019-12-21 DIAGNOSIS — I110 Hypertensive heart disease with heart failure: Secondary | ICD-10-CM | POA: Insufficient documentation

## 2019-12-21 DIAGNOSIS — Z7982 Long term (current) use of aspirin: Secondary | ICD-10-CM | POA: Diagnosis not present

## 2019-12-21 DIAGNOSIS — I10 Essential (primary) hypertension: Secondary | ICD-10-CM | POA: Diagnosis not present

## 2019-12-21 DIAGNOSIS — Z85828 Personal history of other malignant neoplasm of skin: Secondary | ICD-10-CM | POA: Insufficient documentation

## 2019-12-21 DIAGNOSIS — I5042 Chronic combined systolic (congestive) and diastolic (congestive) heart failure: Secondary | ICD-10-CM | POA: Diagnosis not present

## 2019-12-21 LAB — URINALYSIS, ROUTINE W REFLEX MICROSCOPIC
Bilirubin Urine: NEGATIVE
Glucose, UA: NEGATIVE mg/dL
Hgb urine dipstick: NEGATIVE
Ketones, ur: 5 mg/dL — AB
Leukocytes,Ua: NEGATIVE
Nitrite: NEGATIVE
Protein, ur: NEGATIVE mg/dL
Specific Gravity, Urine: 1.012 (ref 1.005–1.030)
pH: 8 (ref 5.0–8.0)

## 2019-12-21 LAB — CBC WITH DIFFERENTIAL/PLATELET
Abs Immature Granulocytes: 0.03 10*3/uL (ref 0.00–0.07)
Basophils Absolute: 0 10*3/uL (ref 0.0–0.1)
Basophils Relative: 0 %
Eosinophils Absolute: 0.2 10*3/uL (ref 0.0–0.5)
Eosinophils Relative: 2 %
HCT: 38.7 % — ABNORMAL LOW (ref 39.0–52.0)
Hemoglobin: 12.7 g/dL — ABNORMAL LOW (ref 13.0–17.0)
Immature Granulocytes: 0 %
Lymphocytes Relative: 19 %
Lymphs Abs: 1.6 10*3/uL (ref 0.7–4.0)
MCH: 31.3 pg (ref 26.0–34.0)
MCHC: 32.8 g/dL (ref 30.0–36.0)
MCV: 95.3 fL (ref 80.0–100.0)
Monocytes Absolute: 0.9 10*3/uL (ref 0.1–1.0)
Monocytes Relative: 11 %
Neutro Abs: 5.6 10*3/uL (ref 1.7–7.7)
Neutrophils Relative %: 68 %
Platelets: 162 10*3/uL (ref 150–400)
RBC: 4.06 MIL/uL — ABNORMAL LOW (ref 4.22–5.81)
RDW: 14.4 % (ref 11.5–15.5)
WBC: 8.3 10*3/uL (ref 4.0–10.5)
nRBC: 0 % (ref 0.0–0.2)

## 2019-12-21 LAB — BASIC METABOLIC PANEL
Anion gap: 8 (ref 5–15)
BUN: 26 mg/dL — ABNORMAL HIGH (ref 8–23)
CO2: 22 mmol/L (ref 22–32)
Calcium: 8 mg/dL — ABNORMAL LOW (ref 8.9–10.3)
Chloride: 109 mmol/L (ref 98–111)
Creatinine, Ser: 1.07 mg/dL (ref 0.61–1.24)
GFR, Estimated: >60 mL/min (ref >60)
Glucose, Bld: 102 mg/dL — ABNORMAL HIGH (ref 70–99)
Potassium: 3.4 mmol/L — ABNORMAL LOW (ref 3.5–5.1)
Sodium: 139 mmol/L (ref 135–145)

## 2019-12-21 LAB — URINE CULTURE
Culture: NO GROWTH
Special Requests: NORMAL

## 2019-12-21 MED ORDER — FENTANYL CITRATE (PF) 100 MCG/2ML IJ SOLN
50.0000 ug | Freq: Once | INTRAMUSCULAR | Status: AC
  Administered 2019-12-21: 50 ug via INTRAVENOUS
  Filled 2019-12-21: qty 2

## 2019-12-21 NOTE — ED Notes (Signed)
Pt's grandaughter at bedside.

## 2019-12-21 NOTE — ED Notes (Signed)
Pt resting comfortably. Pain assessed and currently pt rates it a 4

## 2019-12-21 NOTE — ED Triage Notes (Signed)
Pt BIB EMS. Pt c/o lower back pain and left leg pain. Pt was seen here 2 days ago because of a fall. Pt states he is unable to walk due to the pain. Pt lives at independent facility with wife. Pt has early stages of dementia. EMS gave 50 fent.   BP-184/82 (Pt has not taken BP meds today) HR-52 CBG-117 O2-98%

## 2019-12-21 NOTE — ED Notes (Signed)
Pt's wife updated on condition and admission status

## 2019-12-21 NOTE — ED Notes (Signed)
Attempted to call wife to update, no response. Attempted to ambulate patient in hall, however, when pt tried to get up he contacted in pain, got red in the face and stated he could not walk, he was in too much pain.

## 2019-12-21 NOTE — Progress Notes (Addendum)
.   Transition of Care [facility]) - Emergency Department Mini Assessment   Patient Details  Name: [name] MRN: [id number] Date of Birth: 12-12-1923  [facility] CM/SW Contact:    Erenest Rasher, RN Phone Number: [phone] 12/21/2019, 5:58 PM   Clinical Narrative:  Donella Stade CM contacted [place], Florentina Jenny and she will check to see if they have a SNF rehab bed. Waiting call back. Pt lives at [place] apt.   Received call back from [facility] and they do not have any beds for SNF rehab. They want pt to go to [place], [facility] or [facility]. ED provider updated. Will need PT eval. Pt lives at home with wife but [place]   Spoke to pt and grand-dtr, [name] a bedside. Explained PT will see him tomorrow and pt and granddtr agreeable to CM creating FL2 and fax referral for SNF rehab. Order received from ED provider and pt provided ham sandwich, crackers and water. Dtr will assist pt with eating.     ED Mini Assessment: What brought you to the Emergency Department? : pain, fall  Barriers to Discharge: SNF Pending bed offer        Interventions which prevented an admission or readmission: SNF Placement    Patient Contact and Communications        ,                 Admission diagnosis:  fall/back pain Patient Active Problem List   Diagnosis Date Noted  . Rash 11/18/2019  . Dementia ([place]) 02/17/2019  . Abdominal pain, acute, epigastric 08/02/2018  . Hypertensive urgency 08/02/2018  . Hyperbilirubinemia 08/02/2018  . Hypertension   . Abdominal pain 07/13/2018  . Bradycardia 07/13/2018  . Orthostatic dizziness 12/15/2017  . Wrist pain, acute, right 10/14/2017  . Seborrheic keratoses 06/01/2017  . Chest pain 04/22/2017  . Fall 04/22/2017  . Gait disorder 11/03/2016  . Nausea 10/12/2016  . Hearing loss 11/25/2015  . Wart viral 04/28/2015  . 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  Situational depression 08/07/2013  . Well adult exam 08/02/2012  . Nausea with vomiting 03/28/2012  . HYPERGLYCEMIA 02/23/2010  . HYPERKALEMIA 10/21/2009  . Actinic keratosis 10/21/2009  . 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 PSA, INCREASED 04/22/2009  . MELANOMA, SCALP 01/02/2009  . NEOPLASM, SKIN, UNCERTAIN BEHAVIOR 56/31/4970  . CAROTID ARTERY STENOSIS, WITHOUT INFARCTION 05/06/2008  . Congestive heart failure ([place]) 04/29/2008  . Chronic combined systolic and diastolic CHF (congestive heart failure) ([place]) 04/29/2008  . Osteoarthritis 04/29/2008  . VERTIGO 03/11/2008  . Dyslipidemia 04/14/2007  . Essential hypertension 04/14/2007  . Coronary atherosclerosis 04/14/2007  . BPH (benign prostatic hyperplasia) 04/14/2007   PCP:  Cassandria Anger, MD Pharmacy:   [facility], [place] [address] Phone: [phone] Fax: [phone]  [facility] - [place], [place] - [address] [place] [address] Phone: [phone] Fax: [phone]

## 2019-12-21 NOTE — ED Provider Notes (Signed)
[facility] DEPT Provider Note   CSN: [id number] Arrival date & time: 12/21/19  1430     History Chief Complaint  Patient presents with  . Back Pain    [name] is a 86 y.o. male.  Presents to ER with concern for back pain.  Patient reports he has been having severe back pain ever since a fall from a couple days ago.  Pain is similar to prior, unchanged.  Sharp, stabbing, worse with movement.  None currently at rest.  Having difficulty walking.  No numbness, weakness.  No fevers, no bladder or bowel incontinence.    HPI     Past Medical History:  Diagnosis Date  . CAD (coronary artery disease)    s/p BMS to LCx in 2002, DES to mid-LAD in 2007  . Dizziness and giddiness   . Hypertrophy of prostate without urinary obstruction and other lower urinary tract symptoms (LUTS)   . Ischemic cardiomyopathy   . MI (myocardial infarction) ([place]) 2002  . Mild aortic insufficiency   . Mild mitral regurgitation   . Osteoarthrosis, unspecified whether generalized or localized, unspecified site   . Other and unspecified hyperlipidemia   . Sinus bradycardia   . Skin cancer    "top of his head"  . Thrombocytopenia, unspecified ([place])   . Unspecified essential hypertension     Patient Active Problem List   Diagnosis Date Noted  . Rash 11/18/2019  . Dementia ([place]) 02/17/2019  . Abdominal pain, acute, epigastric 08/02/2018  . Hypertensive urgency 08/02/2018  . Hyperbilirubinemia 08/02/2018  . Hypertension   . Abdominal pain 07/13/2018  . Bradycardia 07/13/2018  . Orthostatic dizziness 12/15/2017  . Wrist pain, acute, right 10/14/2017  . Seborrheic keratoses 06/01/2017  . Chest pain 04/22/2017  . Fall 04/22/2017  . Gait disorder 11/03/2016  . Nausea 10/12/2016  . Hearing loss 11/25/2015  . Wart viral 04/28/2015  . 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 Situational depression 08/07/2013  . Well adult exam 08/02/2012  . Nausea with  vomiting 03/28/2012  . HYPERGLYCEMIA 02/23/2010  . HYPERKALEMIA 10/21/2009  . Actinic keratosis 10/21/2009  . 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 PSA, INCREASED 04/22/2009  . MELANOMA, SCALP 01/02/2009  . NEOPLASM, SKIN, UNCERTAIN BEHAVIOR 42/59/5638  . CAROTID ARTERY STENOSIS, WITHOUT INFARCTION 05/06/2008  . Congestive heart failure ([place]) 04/29/2008  . Chronic combined systolic and diastolic CHF (congestive heart failure) ([place]) 04/29/2008  . Osteoarthritis 04/29/2008  . VERTIGO 03/11/2008  . Dyslipidemia 04/14/2007  . Essential hypertension 04/14/2007  . Coronary atherosclerosis 04/14/2007  . BPH (benign prostatic hyperplasia) 04/14/2007    Past Surgical History:  Procedure Laterality Date  . COLONOSCOPY    . CORONARY ANGIOPLASTY  01/07/2001, 2008   2 stents  . ESOPHAGOGASTRODUODENOSCOPY N/A 04/22/2014   Procedure: ESOPHAGOGASTRODUODENOSCOPY (EGD);  Surgeon: Milus Banister, MD;  Location: [facility];  Service: Endoscopy;  Laterality: N/A;  . HIP ARTHROPLASTY Right 2006  . JOINT REPLACEMENT    . MOHS SURGERY     "top of his head"  . TRANSURETHRAL RESECTION OF PROSTATE         Family History  Problem Relation Age of Onset  . Heart disease Father   . Coronary artery disease Other   . Colon cancer Neg Hx     Social History   Tobacco Use  . Smoking status: Never Smoker  . Smokeless tobacco: Never Used  Vaping Use  . Vaping Use: Never used  Substance Use Topics  . Alcohol use:  No    Alcohol/week: 0.0 standard drinks  . Drug use: No    Home Medications Prior to Admission medications   Medication Sig Start Date End Date Taking? Authorizing Provider  aspirin 81 MG EC tablet Take 81 mg by mouth daily.     Yes [provider]  b complex vitamins tablet Take 1 tablet by mouth daily.     Yes [provider]  bisacodyl (DULCOLAX) 10 MG suppository Place 1 suppository (10 mg total) rectally as needed for moderate constipation. 06/13/19  Yes  Maudie Flakes, MD  Cholecalciferol 1000 UNITS tablet Take 1,000 Units by mouth daily.     Yes [provider]  finasteride (PROSCAR) 5 MG tablet Take 1 tablet (5 mg total) by mouth daily. 02/27/19  Yes Plotnikov, Evie Lacks, MD  Glucosamine-Chondroit-Vit C-Mn (GLUCOSAMINE 1500 COMPLEX PO) Take 1 tablet by mouth 2 (two) times daily.    Yes [provider]  isosorbide mononitrate (IMDUR) 30 MG 24 hr tablet Take 1 tablet (30 mg total) by mouth daily. 02/27/19  Yes Plotnikov, Evie Lacks, MD  nitroGLYCERIN (NITROSTAT) 0.4 MG SL tablet Place 1 tablet (0.4 mg total) under the tongue every 5 (five) minutes as needed for chest pain (Please call your doctor/911 if no improvement after the second dose). 02/19/19  Yes Plotnikov, Evie Lacks, MD  polyethylene glycol (MIRALAX) 17 g packet Take 17 g by mouth daily. Patient taking differently: Take 17 g by mouth daily as needed for mild constipation.  06/13/19  Yes Maudie Flakes, MD  simvastatin (ZOCOR) 40 MG tablet Take 1 tablet (40 mg total) by mouth at bedtime. 10/29/19 01/27/20 Yes Plotnikov, Evie Lacks, MD  tamsulosin (FLOMAX) 0.4 MG CAPS capsule Take 1 capsule (0.4 mg total) by mouth daily after breakfast. 02/27/19  Yes Plotnikov, Evie Lacks, MD  vitamin C (ASCORBIC ACID) 500 MG tablet Take 500 mg by mouth daily.   Yes [provider]  lidocaine (LIDODERM) 5 % Place 1 patch onto the skin daily. Remove & Discard patch within 12 hours or as directed by MD 12/20/19   Randal Buba, April, MD  meloxicam (MOBIC) 7.5 MG tablet Take 1 tablet (7.5 mg total) by mouth daily. 12/20/19   Palumbo, April, MD  pantoprazole (PROTONIX) 40 MG tablet Take 1 tablet (40 mg total) by mouth daily. Patient not taking: Reported on 12/21/2019 02/27/19   Plotnikov, Evie Lacks, MD    Allergies    Oxycodone and Penicillins  Review of Systems   Review of Systems  Constitutional: Negative for chills and fever.  HENT: Negative for ear pain and sore throat.   Eyes: Negative  for pain and visual disturbance.  Respiratory: Negative for cough and shortness of breath.   Cardiovascular: Negative for chest pain and palpitations.  Gastrointestinal: Negative for abdominal pain and vomiting.  Genitourinary: Negative for dysuria and hematuria.  Musculoskeletal: Positive for back pain. Negative for arthralgias.  Skin: Negative for color change and rash.  Neurological: Negative for seizures and syncope.  All other systems reviewed and are negative.   Physical Exam Updated Vital Signs BP (!) 177/68 (BP Location: Right Arm)   Pulse 68   Temp 98.7 F (37.1 C) (Oral)   Resp 18   Ht 5\' 6"  (1.676 m)   Wt 65.8 kg   SpO2 97%   BMI 23.40 kg/m   Physical Exam Vitals and nursing note reviewed.  Constitutional:      Appearance: He is well-developed.  HENT:     Head:  Normocephalic and atraumatic.  Eyes:     Conjunctiva/sclera: Conjunctivae normal.  Cardiovascular:     Rate and Rhythm: Normal rate and regular rhythm.     Heart sounds: No murmur heard.   Pulmonary:     Effort: Pulmonary effort is normal. No respiratory distress.     Breath sounds: Normal breath sounds.  Abdominal:     Palpations: Abdomen is soft.     Tenderness: There is no abdominal tenderness.  Musculoskeletal:     Cervical back: Neck supple.     Comments: Tenderness over lower L-spine, no step-off or deformity; no deformity or tenderness in extremities  Skin:    General: Skin is warm and dry.  Neurological:     Mental Status: He is alert.     Comments: 5 out of 5 strength in lower extremities     ED Results / Procedures / Treatments   Labs (all labs ordered are listed, but only abnormal results are displayed) Labs Reviewed  CBC WITH DIFFERENTIAL/PLATELET - Abnormal; Notable for the following components:      Result Value   RBC 4.06 (*)    Hemoglobin 12.7 (*)    HCT 38.7 (*)    All other components within normal limits  BASIC METABOLIC PANEL - Abnormal; Notable for the following  components:   Potassium 3.4 (*)    Glucose, Bld 102 (*)    BUN 26 (*)    Calcium 8.0 (*)    All other components within normal limits  URINALYSIS, ROUTINE W REFLEX MICROSCOPIC - Abnormal; Notable for the following components:   Ketones, ur 5 (*)    All other components within normal limits  RESP PANEL BY RT-PCR (FLU A&B, COVID) ARPGX2    EKG None  Radiology CT Lumbar Spine Wo Contrast  Result Date: 12/20/2019 CLINICAL DATA:  Initial evaluation for acute lower back pain. EXAM: CT LUMBAR SPINE WITHOUT CONTRAST TECHNIQUE: Multidetector CT imaging of the lumbar spine was performed without intravenous contrast administration. Multiplanar CT image reconstructions were also generated. COMPARISON:  None available. FINDINGS: Segmentation: Standard. Lowest well-formed disc space labeled the L5-S1 level. Alignment: 6 mm anterolisthesis of L4 on L5, with trace 3 mm retrolisthesis of L3 on L4. Findings chronic and facet mediated. Underlying mild levoscoliosis. Alignment otherwise normal with preservation of the normal lumbar lordosis. Vertebrae: Vertebral body height maintained without acute or chronic fracture. Visualized sacrum and pelvis intact. SI joints approximated symmetric. 1 cm sclerotic focus within the posterior right iliac wing most likely reflects a benign bone island. No worrisome lytic or blastic osseous lesions. Paraspinal and other soft tissues: Paraspinous soft tissues demonstrate no acute finding. Advanced aorto bi-iliac atherosclerotic disease. No visible aneurysm. Scattered 3 vessel coronary artery calcifications partially visualized. Probable trace layering right pleural effusion. Mild symmetric fullness of the renal collecting systems noted bilaterally without frank hydronephrosis. No obstructive lesion identified. Few scattered left renal cysts noted, largest of which measures 3.1 cm and is partially calcified. Disc levels: T12-L1: Disc desiccation with mild right eccentric disc bulge.  Mild right greater than left facet hypertrophy. No significant spinal stenosis. Mild bilateral foraminal narrowing. L1-2: Negative interspace. Mild facet hypertrophy. No significant canal or foraminal stenosis. L2-3: Degenerative intervertebral disc space narrowing with mild disc bulge and disc desiccation. Superimposed broad-based left foraminal to extraforaminal disc protrusion (series 4, image 78). Additional small right foraminal disc protrusion noted as well (series 4, image 80).Mild to moderate bilateral facet hypertrophy. Resultant mild left lateral recess without significant narrowing of the  central canal. Mild bilateral L2 foraminal narrowing. L3-4: Degenerative intervertebral disc space narrowing with diffuse disc bulge. Disc bulging slightly eccentric to the right. Moderate bilateral facet hypertrophy. Resultant severe spinal stenosis. Severe right with moderate left L3 foraminal narrowing. L4-5: Anterolisthesis. Associated broad posterior pseudo disc bulge/uncovering. Superimposed right foraminal disc protrusion with associated annular calcification (series 4, image 110). Severe right worse than left facet hypertrophy. Resultant moderate canal with moderate to severe bilateral subarticular stenosis. Moderate right L4 foraminal narrowing. Left neural foramina remains patent. L5-S1: Degenerative intervertebral disc space narrowing with disc desiccation and diffuse disc bulge. Reactive endplate change with marginal endplate osteophytic spurring, greater on the left. Moderate bilateral facet hypertrophy. No significant canal or lateral recess stenosis. Moderate left L5 foraminal narrowing. No significant right foraminal encroachment. IMPRESSION: 1. No acute abnormality within the lumbar spine. 2. Multifactorial degenerative changes at L3-4 and L4-5 with resultant moderate to severe canal and bilateral subarticular stenosis as above. 3. Moderate to severe bilateral L3, right L4, and left L5 foraminal stenosis  related to disc bulge, reactive endplate changes, and facet hypertrophy. 4. Moderate to advanced multilevel facet hypertrophy throughout the lower lumbar spine, most pronounced at L4-5 on the right. Finding could contribute to lower back pain. 5. Advanced aorto bi-iliac atherosclerotic disease with 3 vessel coronary artery calcifications. 6. Probable trace layering right pleural effusion. Aortic Atherosclerosis (ICD10-I70.0). Electronically Signed   By: Jeannine Boga M.D.   On: 12/20/2019 03:47    Procedures Procedures (including critical care time)  Medications Ordered in ED Medications  finasteride (PROSCAR) tablet 5 mg (has no administration in time range)  polyethylene glycol (MIRALAX / GLYCOLAX) packet 17 g (has no administration in time range)  simvastatin (ZOCOR) tablet 40 mg (has no administration in time range)  tamsulosin (FLOMAX) capsule 0.4 mg (has no administration in time range)  isosorbide mononitrate (IMDUR) 24 hr tablet 30 mg (has no administration in time range)  ondansetron (ZOFRAN) tablet 4 mg (has no administration in time range)  oxyCODONE-acetaminophen (PERCOCET/ROXICET) 5-325 MG per tablet 1 tablet (has no administration in time range)  fentaNYL (SUBLIMAZE) injection 25 mcg (has no administration in time range)  fentaNYL (SUBLIMAZE) injection 50 mcg (50 mcg Intravenous Given 12/21/19 1628)  fentaNYL (SUBLIMAZE) injection 50 mcg (50 mcg Intravenous Given 12/21/19 1733)    ED Course  I have reviewed the triage vital signs and the nursing notes.  Pertinent labs & imaging results that were available during my care of the patient were reviewed by me and considered in my medical decision making (see chart for details).    MDM Rules/Calculators/A&P                         86 year old male presents to ER with concern for back pain, back spasms.  Pain since fall, CT scan completed on recent visit was negative.  No additional falls since then.  Strength intact.  No  additional trauma noted on my exam.  Patient is quite comfortable at rest but has severe pain with any movement, unable to walk secondary to pain.  Consult to case management and social work.  Patient resides in independent living facility and their rehab center does not have any bed availability.  Believe patient would benefit from PT eval and likely rehab placement.  Will board patient in ER overnight with plan to have PT eval in morning, social work eval in morning.   Final Clinical Impression(s) / ED Diagnoses Final diagnoses:  Strain  of lumbar region, initial encounter  Low back pain, unspecified back pain laterality, unspecified chronicity, unspecified whether sciatica present    Rx / DC Orders ED Discharge Orders    None       Lucrezia Starch, MD 12/22/19 [phone]

## 2019-12-22 ENCOUNTER — Other Ambulatory Visit: Payer: Self-pay

## 2019-12-22 DIAGNOSIS — S39012A Strain of muscle, fascia and tendon of lower back, initial encounter: Secondary | ICD-10-CM | POA: Diagnosis not present

## 2019-12-22 LAB — RESP PANEL BY RT-PCR (FLU A&B, COVID) ARPGX2
Influenza A by PCR: NEGATIVE
Influenza B by PCR: NEGATIVE
SARS Coronavirus 2 by RT PCR: NEGATIVE

## 2019-12-22 MED ORDER — ISOSORBIDE MONONITRATE ER 30 MG PO TB24
30.0000 mg | ORAL_TABLET | Freq: Every day | ORAL | Status: DC
  Administered 2019-12-22 – 2019-12-23 (×2): 30 mg via ORAL
  Filled 2019-12-22 (×3): qty 1

## 2019-12-22 MED ORDER — SIMVASTATIN 20 MG PO TABS
40.0000 mg | ORAL_TABLET | Freq: Every day | ORAL | Status: DC
  Administered 2019-12-23: 40 mg via ORAL
  Filled 2019-12-22: qty 2

## 2019-12-22 MED ORDER — TAMSULOSIN HCL 0.4 MG PO CAPS
0.4000 mg | ORAL_CAPSULE | Freq: Every day | ORAL | Status: DC
  Administered 2019-12-22 – 2019-12-24 (×3): 0.4 mg via ORAL
  Filled 2019-12-22 (×3): qty 1

## 2019-12-22 MED ORDER — OXYCODONE-ACETAMINOPHEN 5-325 MG PO TABS
1.0000 | ORAL_TABLET | ORAL | Status: DC | PRN
  Filled 2019-12-22: qty 1

## 2019-12-22 MED ORDER — FINASTERIDE 5 MG PO TABS
5.0000 mg | ORAL_TABLET | Freq: Every day | ORAL | Status: DC
  Administered 2019-12-22 – 2019-12-24 (×3): 5 mg via ORAL
  Filled 2019-12-22 (×3): qty 1

## 2019-12-22 MED ORDER — FENTANYL CITRATE (PF) 100 MCG/2ML IJ SOLN
25.0000 ug | INTRAMUSCULAR | Status: DC | PRN
  Filled 2019-12-22: qty 2

## 2019-12-22 MED ORDER — ONDANSETRON HCL 4 MG PO TABS: 4.0000 mg | ORAL_TABLET | Freq: Three times a day (TID) | ORAL | Status: DC | PRN

## 2019-12-22 MED ORDER — POLYETHYLENE GLYCOL 3350 17 G PO PACK
17.0000 g | PACK | Freq: Every day | ORAL | Status: DC | PRN
  Filled 2019-12-22: qty 1

## 2019-12-22 NOTE — TOC Progression Note (Addendum)
Transition of Care [facility]) - Progression Note    Patient Details  Name: [name] MRN: [id number] Date of Birth: [birth year]  Transition of Care [facility]) CM/SW [place], [place] Phone Number: 12/22/2019, 9:05 AM  Clinical Narrative:    CSW spoke with pt's daughter Pam via telephone in reference to pt's dc status. Pam stated that pt's granddaughter [name] is the one who lives in [place] and makes decisions for pt. Pam provided Lisa's phone number as [phone]. CSW went over the conversation previous CM had with Caren Griffins (pt's other daughter who was not available) on possible SNF placements. CSW advised that out of those options, [facility] is currently the only SNF taking pt's at this time. Pam stated that was fine as pt's wife just finished rehab there. CSW advised that she would reach out to [facility] to see if pt could admit on weekend but it may be Monday before pt would move. Pam stated that was fine. Pam stated pt's wife will be against pt being at a SNF but it would not be safe to have pt come home.   CSW reached out to [facility], was advised that admissions are only done on weekends if paperwork is completed by Friday. CSW was further advised that referral would be reviewed on Monday.  Pt needs a PT evaluation with SNF recommendation, MD will be notified.   CSW spoke with pt's wife, she is concerned about pt and would like for him to come home.  CSW spoke with pt's granddaughter Jeannene Patella, updated her on the SNF choices and status of pt's dc. Pam asked if pt would be put in a room, CSW adv that pt would be moved to a room if one became available per the charge RN, otherwise pt would remain in the hall until dcon Monday. Pam req to be called back if/when pt moves in a room.      Barriers to Discharge: SNF Pending bed offer  Expected Discharge Plan and Services                                                 Social Determinants of Health (SDOH)  Interventions    Readmission Risk Interventions No flowsheet data found.

## 2019-12-22 NOTE — ED Notes (Signed)
Pt spoke on the phone with wife. Pt is more at ease and cooperative after speaking to wife.

## 2019-12-22 NOTE — NC FL2 (Signed)
[place] LEVEL OF CARE SCREENING TOOL     IDENTIFICATION  Patient Name: [name] Birthdate: 1923/03/10 Sex: male Admission Date (Current Location): 12/21/2019  [facility] and [place] Number:  Herbalist and Address:  [facility],  [place] [address] [place]      Provider Number: [phone]  Attending Physician Name and Address:  No att. providers found  Relative Name and Phone Number:  [place], [place] [phone]    Current Level of Care: Hospital Recommended Level of Care: [facility] Prior Approval Number:    Date Approved/Denied:   PASRR Number: [id number]  Discharge Plan: SNF    Current Diagnoses: Patient Active Problem List   Diagnosis Date Noted  . Rash 11/18/2019  . Dementia ([place]) 02/17/2019  . Abdominal pain, acute, epigastric 08/02/2018  . Hypertensive urgency 08/02/2018  . Hyperbilirubinemia 08/02/2018  . Hypertension   . Abdominal pain 07/13/2018  . Bradycardia 07/13/2018  . Orthostatic dizziness 12/15/2017  . Wrist pain, acute, right 10/14/2017  . Seborrheic keratoses 06/01/2017  . Chest pain 04/22/2017  . Fall 04/22/2017  . Gait disorder 11/03/2016  . Nausea 10/12/2016  . Hearing loss 11/25/2015  . Wart viral 04/28/2015  . 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 Situational depression 08/07/2013  . Well adult exam 08/02/2012  . Nausea with vomiting 03/28/2012  . HYPERGLYCEMIA 02/23/2010  . HYPERKALEMIA 10/21/2009  . Actinic keratosis 10/21/2009  . 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 PSA, INCREASED 04/22/2009  . MELANOMA, SCALP 01/02/2009  . NEOPLASM, SKIN, UNCERTAIN BEHAVIOR 34/74/2595  . CAROTID ARTERY STENOSIS, WITHOUT INFARCTION 05/06/2008  . Congestive heart failure ([place]) 04/29/2008  . Chronic combined systolic and diastolic CHF (congestive heart failure) ([place]) 04/29/2008  . Osteoarthritis 04/29/2008  . VERTIGO 03/11/2008  . Dyslipidemia 04/14/2007   . Essential hypertension 04/14/2007  . Coronary atherosclerosis 04/14/2007  . BPH (benign prostatic hyperplasia) 04/14/2007    Orientation RESPIRATION BLADDER Height & Weight     Self, Place  Normal Continent Weight: 145 lb (65.8 kg) Height:  5\' 6"  (167.6 cm)  BEHAVIORAL SYMPTOMS/MOOD NEUROLOGICAL BOWEL NUTRITION STATUS      Continent Diet (Regular)  AMBULATORY STATUS COMMUNICATION OF NEEDS Skin   Extensive Assist Verbally Normal                       Personal Care Assistance Level of Assistance  Bathing, Feeding, Dressing Bathing Assistance: Limited assistance Feeding assistance: Independent Dressing Assistance: Limited assistance     Functional Limitations Info  Sight, Hearing, Speech Sight Info: Adequate Hearing Info: Adequate Speech Info: Adequate    SPECIAL CARE FACTORS FREQUENCY  PT (By licensed PT), OT (By licensed OT)     PT Frequency: 5x weekly OT Frequency: 5x weekly            Contractures Contractures Info: Not present    Additional Factors Info  Code Status, Allergies Code Status Info: DNR Allergies Info: (2) Penicillins   Oxycodone           Current Medications (12/22/2019):  This is the current hospital active medication list Current Facility-Administered Medications  Medication Dose Route Frequency Provider Last Rate Last Admin  . fentaNYL (SUBLIMAZE) injection 25 mcg  25 mcg Intravenous Q2H PRN Lucrezia Starch, MD      . finasteride (PROSCAR) tablet 5 mg  5 mg Oral Daily Lucrezia Starch, MD   5 mg at 12/22/19 0901  . isosorbide mononitrate (IMDUR) 24 hr tablet 30  mg  30 mg Oral Daily Lucrezia Starch, MD   30 mg at 12/22/19 0901  . ondansetron (ZOFRAN) tablet 4 mg  4 mg Oral Q8H PRN Lucrezia Starch, MD      . oxyCODONE-acetaminophen (PERCOCET/ROXICET) 5-325 MG per tablet 1 tablet  1 tablet Oral Q4H PRN Lucrezia Starch, MD      . polyethylene glycol (MIRALAX / GLYCOLAX) packet 17 g  17 g Oral Daily PRN Lucrezia Starch,  MD      . simvastatin (ZOCOR) tablet 40 mg  40 mg Oral QHS Lucrezia Starch, MD      . tamsulosin [facility]) capsule 0.4 mg  0.4 mg Oral QPC breakfast Lucrezia Starch, MD   0.4 mg at 12/22/19 0901   Current Outpatient Medications  Medication Sig Dispense Refill  . aspirin 81 MG EC tablet Take 81 mg by mouth daily.      [name] b complex vitamins tablet Take 1 tablet by mouth daily.      . bisacodyl (DULCOLAX) 10 MG suppository Place 1 suppository (10 mg total) rectally as needed for moderate constipation. 12 suppository 1  . Cholecalciferol 1000 UNITS tablet Take 1,000 Units by mouth daily.      . finasteride (PROSCAR) 5 MG tablet Take 1 tablet (5 mg total) by mouth daily. 90 tablet 3  . Glucosamine-Chondroit-Vit C-Mn (GLUCOSAMINE 1500 COMPLEX PO) Take 1 tablet by mouth 2 (two) times daily.     . isosorbide mononitrate (IMDUR) 30 MG 24 hr tablet Take 1 tablet (30 mg total) by mouth daily. 90 tablet 3  . nitroGLYCERIN (NITROSTAT) 0.4 MG SL tablet Place 1 tablet (0.4 mg total) under the tongue every 5 (five) minutes as needed for chest pain (Please call your doctor/911 if no improvement after the second dose). 25 tablet 0  . polyethylene glycol (MIRALAX) 17 g packet Take 17 g by mouth daily. (Patient taking differently: Take 17 g by mouth daily as needed for mild constipation. ) 30 each 1  . simvastatin (ZOCOR) 40 MG tablet Take 1 tablet (40 mg total) by mouth at bedtime. 90 tablet 3  . tamsulosin (FLOMAX) 0.4 MG CAPS capsule Take 1 capsule (0.4 mg total) by mouth daily after breakfast. 90 capsule 3  . vitamin C (ASCORBIC ACID) 500 MG tablet Take 500 mg by mouth daily.    [name] lidocaine (LIDODERM) 5 % Place 1 patch onto the skin daily. Remove & Discard patch within 12 hours or as directed by MD 30 patch 0  . meloxicam (MOBIC) 7.5 MG tablet Take 1 tablet (7.5 mg total) by mouth daily. 5 tablet 0  . pantoprazole (PROTONIX) 40 MG tablet Take 1 tablet (40 mg total) by mouth daily. (Patient not taking:  Reported on 12/21/2019) 90 tablet 3     Discharge Medications: Please see discharge summary for a list of discharge medications.  Relevant Imaging Results:  Relevant Lab Results:   Additional Information SS#[id number] [place], LCSW

## 2019-12-22 NOTE — ED Notes (Signed)
Wife is at bedside.

## 2019-12-22 NOTE — ED Notes (Signed)
Pt was able to speak to wife on the phone.

## 2019-12-22 NOTE — ED Notes (Signed)
Main point of contact for patient is Granddaughter Almetta Lovely [phone]

## 2019-12-22 NOTE — Progress Notes (Signed)
CSW faxed referrals to [facility], [facility], [facility] and [facility].

## 2019-12-22 NOTE — ED Notes (Signed)
Attempted to call wife, per pt's request. No response.

## 2019-12-22 NOTE — Social Work (Signed)
CSW met with Pt at bedside in order to collect information for FL2. Pt asked CSW for assistance calling wife.  CSW was able to reach Pt's wife [name] via ED phone @ [phone]

## 2019-12-22 NOTE — Evaluation (Signed)
Physical Therapy Evaluation Patient Details Name: [name] MRN: [id number] DOB: Feb 09, 1923 Today's Date: 12/22/2019   History of Present Illness  [name] is a 86 y.o. male.  Presents to ER for second time in 2 days with  back pain after recent fall.PMH: HTN, CAD, mild cognitive deficits.  Clinical Impression  Patient initially very reluctant to mobilize, stating back pain was too great. Patient had to be rolled to change bed linens. Patient tolerated well. Patient was than able to be assisted with max/total of 2 to sitting on edge of stretcher and was able to stand at [facility] and take 4 side steps with 2 mod assisting. Patient  Required total assistance to return to supine.  Patient will benefit from post acute rehab.  Pt admitted with above diagnosis.   Pt currently with functional limitations due to the deficits listed below (see PT Problem List). Pt will benefit from skilled PT to increase their independence and safety with mobility to allow discharge to the venue listed below.       Follow Up Recommendations SNF;Supervision/Assistance - 24 hour    Equipment Recommendations  None recommended by PT    Recommendations for Other Services       Precautions / Restrictions Precautions Precautions: Fall Precaution Comments: incontinence      Mobility  Bed Mobility Overal bed mobility: Needs Assistance Bed Mobility: Rolling;Supine to Sit;Sit to Supine Rolling: Total assist;+2 for physical assistance;+2 for safety/equipment   Supine to sit: +2 for physical assistance;+2 for safety/equipment;Max assist Sit to supine: Total assist;+2 for physical assistance;+2 for safety/equipment;HOB elevated   General bed mobility comments: patient required extra time and assistance to roll to each side for linens to be change. patient's truk is rigid, resistive possible due to pain. Patient assisted  moving legs to bed edge, required  max assistance to sit upright. Patient able to balance  and scoot to edge.    Transfers Overall transfer level: Needs assistance Equipment used: Rolling walker (2 wheeled) Transfers: Sit to/from Stand Sit to Stand: +2 physical assistance;+2 safety/equipment;From elevated surface;Mod assist         General transfer comment: patient took hold of RW, stood up from stretcher. Took 4 sidesteps and able to sit back down. Able to scoot back onto stretcher.  Ambulation/Gait Ambulation/Gait assistance: Mod assist;+2 physical assistance;+2 safety/equipment   Assistive device: Rolling walker (2 wheeled)       General Gait Details: sidesteps x 4  Stairs            Wheelchair Mobility    Modified Rankin (Stroke Patients Only)       Balance Overall balance assessment: History of Falls;Needs assistance Sitting-balance support: Bilateral upper extremity supported;Feet unsupported Sitting balance-Leahy Scale: Poor   Postural control: Posterior lean Standing balance support: Bilateral upper extremity supported;During functional activity Standing balance-Leahy Scale: Poor Standing balance comment: reliant on UE and external support                             Pertinent Vitals/Pain Pain Assessment: Faces Faces Pain Scale: Hurts whole lot Pain Location: bac Pain Descriptors / Indicators: Discomfort;Grimacing;Guarding Pain Intervention(s): Monitored during session;Limited activity within patient's tolerance;Repositioned    Home Living Family/patient expects to be discharged to:: Private residence Living Arrangements: Spouse/significant other   Type of Home: Independent living facility Home Access: Level entry;Elevator     Home Layout: One level Home Equipment: Environmental consultant - 4 wheels  Prior Function Level of Independence: Needs assistance   Gait / Transfers Assistance Needed: uses rollator. lives with wife who is unable to assit patient.           Hand Dominance        Extremity/Trunk Assessment    Upper Extremity Assessment Upper Extremity Assessment: RUE deficits/detail;LUE deficits/detail RUE Deficits / Details: tremors and  rigidity to movements LUE Deficits / Details: similar to right    Lower Extremity Assessment Lower Extremity Assessment: RLE deficits/detail;LLE deficits/detail RLE Deficits / Details: slowly flexes  hip and knee, more  guarded than right LLE Deficits / Details: flexes hip and knee readily in supine    Cervical / Trunk Assessment Cervical / Trunk Assessment: Kyphotic  Communication      Cognition Arousal/Alertness: Awake/alert Behavior During Therapy: WFL for tasks assessed/performed Overall Cognitive Status: History of cognitive impairments - at baseline                                 General Comments: oriented to [facility] and had a fall      General Comments      Exercises     Assessment/Plan    PT Assessment Patient needs continued PT services  PT Problem List Decreased strength;Decreased cognition;Decreased knowledge of use of DME;Decreased range of motion;Decreased activity tolerance;Pain;Decreased safety awareness;Decreased knowledge of precautions;Decreased balance;Decreased mobility       PT Treatment Interventions DME instruction;Balance training;Gait training;Functional mobility training;Therapeutic activities;Therapeutic exercise;Patient/family education;Cognitive remediation    PT Goals (Current goals can be found in the Care Plan section)  Acute Rehab PT Goals Patient Stated Goal: agreed to sit up if not in pain PT Goal Formulation: Patient unable to participate in goal setting Time For Goal Achievement: 01/05/20 Potential to Achieve Goals: Fair    Frequency Min 2X/week   Barriers to discharge Decreased caregiver support      Co-evaluation               AM-PAC PT "6 Clicks" Mobility  Outcome Measure Help needed turning from your back to your side while in a flat bed without using bedrails?: Total Help  needed moving from lying on your back to sitting on the side of a flat bed without using bedrails?: Total Help needed moving to and from a bed to a chair (including a wheelchair)?: Total Help needed standing up from a chair using your arms (e.g., wheelchair or bedside chair)?: Total Help needed to walk in hospital room?: Total Help needed climbing 3-5 steps with a railing? : Total 6 Click Score: 6    End of Session Equipment Utilized During Treatment: Gait belt Activity Tolerance: Patient tolerated treatment well Patient left: in bed;with call bell/phone within reach;with nursing/sitter in room Nurse Communication: Mobility status PT Visit Diagnosis: Difficulty in walking, not elsewhere classified (R26.2);Pain    Time: 1062-6948 PT Time Calculation (min) (ACUTE ONLY): 41 min   Charges:   PT Evaluation $PT Eval Moderate Complexity: 1 Mod PT Treatments $Therapeutic Activity: 8-22 mins        Tresa Endo PT Acute Rehabilitation Services Pager [phone] Office [phone]   Claretha Cooper 12/22/2019, 12:50 PM

## 2019-12-22 NOTE — Progress Notes (Signed)
TOC CM contacted [facility], admission rep [facility] and left message for return call. Myrtle, [place] ED TOC CM [phone]

## 2019-12-22 NOTE — ED Notes (Signed)
Pt was able to speak to daughter Caren Griffins, per his request.

## 2019-12-23 DIAGNOSIS — S39012A Strain of muscle, fascia and tendon of lower back, initial encounter: Secondary | ICD-10-CM | POA: Diagnosis not present

## 2019-12-23 MED ORDER — LORAZEPAM 2 MG/ML IJ SOLN
0.5000 mg | Freq: Once | INTRAMUSCULAR | Status: AC
  Administered 2019-12-23: 0.5 mg via INTRAVENOUS
  Filled 2019-12-23: qty 1

## 2019-12-23 MED ORDER — ONDANSETRON HCL 4 MG/2ML IJ SOLN
4.0000 mg | Freq: Once | INTRAMUSCULAR | Status: AC
  Administered 2019-12-23: 4 mg via INTRAVENOUS
  Filled 2019-12-23: qty 2

## 2019-12-23 NOTE — ED Notes (Signed)
Pt woke up stating that he needed to get out bed to go home.  Pt informed that he was in the hospital and that it was the evening. He would not be going home this evening Pt verbalized understanding Pt then asked for his food. Meal tray warmed up and given to pt.

## 2019-12-23 NOTE — Progress Notes (Signed)
TOC CM/CSW spoke with pt and pts wife/Ruth, they have agreed on [facility].  [facility] today.  CSW will continue to follow for dc needs.  Braydyn Schultes Tarpley-Carter, MSW, LCSW-A Pronouns:  She, Her, Hers                  [place] ED Transitions of CareClinical Social Worker [email or web address] [phone]

## 2019-12-23 NOTE — ED Notes (Signed)
Call received from pt wife Aikeem Lilley [phone] requesting rtn call for pt status/updates. [employer]

## 2019-12-23 NOTE — ED Notes (Signed)
Pt to room 31.  Pt asleep, no s/s of distress.

## 2019-12-23 NOTE — Discharge Instructions (Signed)
Tylenol 1000mg  4x a day for pain.  Follow up with your family doc.  Return for worsening symptoms.

## 2019-12-23 NOTE — Progress Notes (Signed)
TOC CM/CSW contacted [facility] due to them offering a bed.  CSW wanted to verify the offer and see what additional information was needed.  Tony[facility] asked for [facility] to be posted to [facility] when it was available.  CSW will continue to follow for dc needs.  Samanthajo Payano Tarpley-Carter, MSW, LCSW-A Pronouns:  She, Her, Hers                  [place] ED Transitions of CareClinical Social Worker [email or web address] [phone]

## 2019-12-23 NOTE — Progress Notes (Signed)
TOC CM/CSW received a call from [facility] and TOC CM/RN to notify CSW that pt was going to dc'd to [facility] on Monday, 12/24/2019.  CSW will continue to follow for dc needs.  Anneka Studer Tarpley-Carter, MSW, LCSW-A Pronouns:  She, Her, Hers                  [place] ED Transitions of CareClinical Social Worker [email or web address] [phone]

## 2019-12-24 DIAGNOSIS — M549 Dorsalgia, unspecified: Secondary | ICD-10-CM | POA: Diagnosis not present

## 2019-12-24 DIAGNOSIS — M2550 Pain in unspecified joint: Secondary | ICD-10-CM | POA: Diagnosis not present

## 2019-12-24 DIAGNOSIS — R52 Pain, unspecified: Secondary | ICD-10-CM | POA: Diagnosis not present

## 2019-12-24 DIAGNOSIS — S39012A Strain of muscle, fascia and tendon of lower back, initial encounter: Secondary | ICD-10-CM | POA: Diagnosis not present

## 2019-12-24 DIAGNOSIS — Z7401 Bed confinement status: Secondary | ICD-10-CM | POA: Diagnosis not present

## 2019-12-24 DIAGNOSIS — R4182 Altered mental status, unspecified: Secondary | ICD-10-CM | POA: Diagnosis not present

## 2019-12-24 LAB — RESP PANEL BY RT-PCR (FLU A&B, COVID) ARPGX2
Influenza A by PCR: NEGATIVE
Influenza B by PCR: NEGATIVE
SARS Coronavirus 2 by RT PCR: NEGATIVE

## 2019-12-24 NOTE — Progress Notes (Signed)
TOC CM/CSW attempted to contact pts granddaughter/Lisa Goller [phone].  CSW left HIPPA compliant message with my contact information.  CSW will continue to follow for dc needs.  Elaysia Devargas Tarpley-Carter, MSW, LCSW-A Pronouns:  She, Her, Hers                  [place] ED Transitions of CareClinical Social Worker [email or web address] [phone]

## 2019-12-24 NOTE — Progress Notes (Signed)
TOC CM/CSW once pts COVID screening is completed pt can be transported to [facility] (SNF side).  Call report is as follows:  Room #:  016  Call Report #:  [phone]  CSW will continue to follow for dc needs.  Mariem Skolnick Tarpley-Carter, MSW, LCSW-A Pronouns:  She, Her, [place] ED Transitions of CareClinical Social Worker [email or web address] [phone]

## 2019-12-24 NOTE — ED Provider Notes (Signed)
Patient discharged to facility.  Stable throughout my care.   Lennice Sites, DO 12/24/19 1115

## 2019-12-24 NOTE — ED Notes (Signed)
Pt resting comfortably in bed. No s/s of distress.

## 2019-12-24 NOTE — Progress Notes (Signed)
TOC CM/CSW spoke with Beth,RN.  Beth stated that the pts grandaughter/Lisa Edsel Petrin has some concerns about him returning to assisted living.  Pts grandaughter/Lisa states, "pt would have no one to care for him there, his wife is recovering from surgery".  CSW will continue to follow for dc needs.  Byanca Kasper Tarpley-Carter, MSW, LCSW-A Pronouns:  She, Her, Hers                  [place] ED Transitions of CareClinical Social Worker [email or web address] [phone]

## 2019-12-24 NOTE — ED Notes (Signed)
Pt DCd off unit to SNF per provider. Pt alert, calm, cooperative , no s/s of distress.  DC information and belongings given to [facility] staff for transport. Pt off unit on stretcher. Pt transported by [facility].

## 2019-12-26 DIAGNOSIS — F0390 Unspecified dementia without behavioral disturbance: Secondary | ICD-10-CM | POA: Diagnosis not present

## 2019-12-26 DIAGNOSIS — I209 Angina pectoris, unspecified: Secondary | ICD-10-CM | POA: Diagnosis not present

## 2019-12-26 DIAGNOSIS — K5900 Constipation, unspecified: Secondary | ICD-10-CM | POA: Diagnosis not present

## 2019-12-26 DIAGNOSIS — K219 Gastro-esophageal reflux disease without esophagitis: Secondary | ICD-10-CM | POA: Diagnosis not present

## 2019-12-26 DIAGNOSIS — M150 Primary generalized (osteo)arthritis: Secondary | ICD-10-CM | POA: Diagnosis not present

## 2019-12-26 DIAGNOSIS — D519 Vitamin B12 deficiency anemia, unspecified: Secondary | ICD-10-CM | POA: Diagnosis not present

## 2019-12-26 DIAGNOSIS — E559 Vitamin D deficiency, unspecified: Secondary | ICD-10-CM | POA: Diagnosis not present

## 2019-12-26 DIAGNOSIS — M6281 Muscle weakness (generalized): Secondary | ICD-10-CM | POA: Diagnosis not present

## 2019-12-26 DIAGNOSIS — G4700 Insomnia, unspecified: Secondary | ICD-10-CM | POA: Diagnosis not present

## 2019-12-26 DIAGNOSIS — N401 Enlarged prostate with lower urinary tract symptoms: Secondary | ICD-10-CM | POA: Diagnosis not present

## 2019-12-26 DIAGNOSIS — F419 Anxiety disorder, unspecified: Secondary | ICD-10-CM | POA: Diagnosis not present

## 2019-12-26 DIAGNOSIS — I2510 Atherosclerotic heart disease of native coronary artery without angina pectoris: Secondary | ICD-10-CM | POA: Diagnosis not present

## 2019-12-27 ENCOUNTER — Emergency Department (HOSPITAL_COMMUNITY): Payer: Medicare Other

## 2019-12-27 ENCOUNTER — Emergency Department (HOSPITAL_COMMUNITY)
Admission: EM | Admit: 2019-12-27 | Discharge: 2019-12-27 | Disposition: A | Discharge status: Home / Self Care | Payer: Medicare Other | Attending: Emergency Medicine | Admitting: Emergency Medicine

## 2019-12-27 ENCOUNTER — Other Ambulatory Visit: Payer: Self-pay

## 2019-12-27 ENCOUNTER — Encounter (HOSPITAL_COMMUNITY): Payer: Self-pay | Admitting: Emergency Medicine

## 2019-12-27 DIAGNOSIS — Z7401 Bed confinement status: Secondary | ICD-10-CM | POA: Diagnosis not present

## 2019-12-27 DIAGNOSIS — Z955 Presence of coronary angioplasty implant and graft: Secondary | ICD-10-CM | POA: Insufficient documentation

## 2019-12-27 DIAGNOSIS — R531 Weakness: Secondary | ICD-10-CM | POA: Insufficient documentation

## 2019-12-27 DIAGNOSIS — M2550 Pain in unspecified joint: Secondary | ICD-10-CM | POA: Diagnosis not present

## 2019-12-27 DIAGNOSIS — Z7982 Long term (current) use of aspirin: Secondary | ICD-10-CM | POA: Diagnosis not present

## 2019-12-27 DIAGNOSIS — F0390 Unspecified dementia without behavioral disturbance: Secondary | ICD-10-CM | POA: Insufficient documentation

## 2019-12-27 DIAGNOSIS — R0789 Other chest pain: Secondary | ICD-10-CM | POA: Insufficient documentation

## 2019-12-27 DIAGNOSIS — I5042 Chronic combined systolic (congestive) and diastolic (congestive) heart failure: Secondary | ICD-10-CM | POA: Insufficient documentation

## 2019-12-27 DIAGNOSIS — R404 Transient alteration of awareness: Secondary | ICD-10-CM | POA: Diagnosis not present

## 2019-12-27 DIAGNOSIS — I2510 Atherosclerotic heart disease of native coronary artery without angina pectoris: Secondary | ICD-10-CM | POA: Diagnosis not present

## 2019-12-27 DIAGNOSIS — Z79899 Other long term (current) drug therapy: Secondary | ICD-10-CM | POA: Insufficient documentation

## 2019-12-27 DIAGNOSIS — Z96651 Presence of right artificial knee joint: Secondary | ICD-10-CM | POA: Diagnosis not present

## 2019-12-27 DIAGNOSIS — R55 Syncope and collapse: Secondary | ICD-10-CM | POA: Diagnosis not present

## 2019-12-27 DIAGNOSIS — R001 Bradycardia, unspecified: Secondary | ICD-10-CM | POA: Diagnosis not present

## 2019-12-27 DIAGNOSIS — I959 Hypotension, unspecified: Secondary | ICD-10-CM | POA: Diagnosis not present

## 2019-12-27 DIAGNOSIS — M549 Dorsalgia, unspecified: Secondary | ICD-10-CM | POA: Diagnosis not present

## 2019-12-27 DIAGNOSIS — I110 Hypertensive heart disease with heart failure: Secondary | ICD-10-CM | POA: Insufficient documentation

## 2019-12-27 DIAGNOSIS — R4182 Altered mental status, unspecified: Secondary | ICD-10-CM | POA: Diagnosis not present

## 2019-12-27 DIAGNOSIS — Z85828 Personal history of other malignant neoplasm of skin: Secondary | ICD-10-CM | POA: Diagnosis not present

## 2019-12-27 DIAGNOSIS — R079 Chest pain, unspecified: Secondary | ICD-10-CM | POA: Diagnosis not present

## 2019-12-27 LAB — TROPONIN I (HIGH SENSITIVITY)
Troponin I (High Sensitivity): 24 ng/L — ABNORMAL HIGH (ref <18)
Troponin I (High Sensitivity): 23 ng/L — ABNORMAL HIGH (ref <18)

## 2019-12-27 LAB — CBC
HCT: 43.4 % (ref 39.0–52.0)
Hemoglobin: 13.8 g/dL (ref 13.0–17.0)
MCH: 30.7 pg (ref 26.0–34.0)
MCHC: 31.8 g/dL (ref 30.0–36.0)
MCV: 96.7 fL (ref 80.0–100.0)
Platelets: 224 10*3/uL (ref 150–400)
RBC: 4.49 MIL/uL (ref 4.22–5.81)
RDW: 14.1 % (ref 11.5–15.5)
WBC: 9.1 10*3/uL (ref 4.0–10.5)
nRBC: 0 % (ref 0.0–0.2)

## 2019-12-27 LAB — BASIC METABOLIC PANEL
Anion gap: 10 (ref 5–15)
BUN: 30 mg/dL — ABNORMAL HIGH (ref 8–23)
CO2: 25 mmol/L (ref 22–32)
Calcium: 9.2 mg/dL (ref 8.9–10.3)
Chloride: 104 mmol/L (ref 98–111)
Creatinine, Ser: 1.27 mg/dL — ABNORMAL HIGH (ref 0.61–1.24)
GFR, Estimated: 52 mL/min — ABNORMAL LOW (ref >60)
Glucose, Bld: 128 mg/dL — ABNORMAL HIGH (ref 70–99)
Potassium: 4.4 mmol/L (ref 3.5–5.1)
Sodium: 139 mmol/L (ref 135–145)

## 2019-12-27 MED ORDER — IOHEXOL 350 MG/ML SOLN
100.0000 mL | Freq: Once | INTRAVENOUS | Status: AC | PRN
  Administered 2019-12-27: 52 mL via INTRAVENOUS

## 2019-12-27 NOTE — ED Notes (Signed)
Report attempted to [facility] times with no answer. Patient left with [facility]. Wife [name] notified

## 2019-12-27 NOTE — ED Notes (Signed)
[facility] called @ 1640-per RN called by Levada Dy

## 2019-12-27 NOTE — ED Provider Notes (Signed)
[facility] EMERGENCY DEPARTMENT Provider Note   CSN: [id number] Arrival date & time: 12/27/19  1206     History Chief Complaint  Patient presents with  . Weakness  . AMS    [name] is a 86 y.o. male.  Pt presents to the ED today with cp.  Pt is at a SNF and the nurses called EMS due to CP.  Pt received asa and morphine en route.  Pt is a poor historian now and is sleepy on exam.  Pt denies any current cp.          Past Medical History:  Diagnosis Date  . CAD (coronary artery disease)    s/p BMS to LCx in 2002, DES to mid-LAD in 2007  . Dizziness and giddiness   . Hypertrophy of prostate without urinary obstruction and other lower urinary tract symptoms (LUTS)   . Ischemic cardiomyopathy   . MI (myocardial infarction) ([place]) 2002  . Mild aortic insufficiency   . Mild mitral regurgitation   . Osteoarthrosis, unspecified whether generalized or localized, unspecified site   . Other and unspecified hyperlipidemia   . Sinus bradycardia   . Skin cancer    "top of his head"  . Thrombocytopenia, unspecified ([place])   . Unspecified essential hypertension     Patient Active Problem List   Diagnosis Date Noted  . Rash 11/18/2019  . Dementia ([place]) 02/17/2019  . Abdominal pain, acute, epigastric 08/02/2018  . Hypertensive urgency 08/02/2018  . Hyperbilirubinemia 08/02/2018  . Hypertension   . Abdominal pain 07/13/2018  . Bradycardia 07/13/2018  . Orthostatic dizziness 12/15/2017  . Wrist pain, acute, right 10/14/2017  . Seborrheic keratoses 06/01/2017  . Chest pain 04/22/2017  . Fall 04/22/2017  . Gait disorder 11/03/2016  . Nausea 10/12/2016  . Hearing loss 11/25/2015  . Wart viral 04/28/2015  . 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 Situational depression 08/07/2013  . Well adult exam 08/02/2012  . Nausea with vomiting 03/28/2012  . HYPERGLYCEMIA 02/23/2010  . HYPERKALEMIA 10/21/2009  . Actinic keratosis 10/21/2009  .  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 PSA, INCREASED 04/22/2009  . MELANOMA, SCALP 01/02/2009  . NEOPLASM, SKIN, UNCERTAIN BEHAVIOR 17/40/8144  . CAROTID ARTERY STENOSIS, WITHOUT INFARCTION 05/06/2008  . Congestive heart failure ([place]) 04/29/2008  . Chronic combined systolic and diastolic CHF (congestive heart failure) ([place]) 04/29/2008  . Osteoarthritis 04/29/2008  . VERTIGO 03/11/2008  . Dyslipidemia 04/14/2007  . Essential hypertension 04/14/2007  . Coronary atherosclerosis 04/14/2007  . BPH (benign prostatic hyperplasia) 04/14/2007    Past Surgical History:  Procedure Laterality Date  . COLONOSCOPY    . CORONARY ANGIOPLASTY  01/07/2001, 2008   2 stents  . ESOPHAGOGASTRODUODENOSCOPY N/A 04/22/2014   Procedure: ESOPHAGOGASTRODUODENOSCOPY (EGD);  Surgeon: Milus Banister, MD;  Location: [facility];  Service: Endoscopy;  Laterality: N/A;  . HIP ARTHROPLASTY Right 2006  . JOINT REPLACEMENT    . MOHS SURGERY     "top of his head"  . TRANSURETHRAL RESECTION OF PROSTATE         Family History  Problem Relation Age of Onset  . Heart disease Father   . Coronary artery disease Other   . Colon cancer Neg Hx     Social History   Tobacco Use  . Smoking status: Never Smoker  . Smokeless tobacco: Never Used  Vaping Use  . Vaping Use: Never used  Substance Use Topics  . Alcohol use: No    Alcohol/week: 0.0 standard drinks  .  Drug use: No    Home Medications Prior to Admission medications   Medication Sig Start Date End Date Taking? Authorizing Provider  acetaminophen (TYLENOL) 500 MG tablet Take 1,000 mg by mouth 4 (four) times daily.   Yes [provider]  aspirin 81 MG EC tablet Take 81 mg by mouth daily.     Yes [provider]  b complex vitamins tablet Take 1 tablet by mouth daily.     Yes [provider]  bisacodyl (DULCOLAX) 10 MG suppository Place 1 suppository (10 mg total) rectally as needed for moderate constipation. 06/13/19  Yes  Maudie Flakes, MD  Cholecalciferol 1000 UNITS tablet Take 1,000 Units by mouth daily.     Yes [provider]  finasteride (PROSCAR) 5 MG tablet Take 1 tablet (5 mg total) by mouth daily. 02/27/19  Yes Plotnikov, Evie Lacks, MD  Glucosamine-Chondroitin 500-400 MG CAPS Take 1 capsule by mouth 2 (two) times daily.   Yes [provider]  ibuprofen (ADVIL) 200 MG tablet Take 200 mg by mouth every 4 (four) hours as needed (pain).   Yes [provider]  isosorbide mononitrate (IMDUR) 30 MG 24 hr tablet Take 1 tablet (30 mg total) by mouth daily. 02/27/19  Yes Plotnikov, Evie Lacks, MD  meloxicam (MOBIC) 7.5 MG tablet Take 1 tablet (7.5 mg total) by mouth daily. Patient taking differently: Take 7.5 mg by mouth at bedtime.  12/20/19  Yes Palumbo, April, MD  nitroGLYCERIN (NITROSTAT) 0.4 MG SL tablet Place 1 tablet (0.4 mg total) under the tongue every 5 (five) minutes as needed for chest pain (Please call your doctor/911 if no improvement after the second dose). 02/19/19  Yes Plotnikov, Evie Lacks, MD  pantoprazole (PROTONIX) 40 MG tablet Take 1 tablet (40 mg total) by mouth daily. Patient taking differently: Take 40 mg by mouth daily at 6 (six) AM.  02/27/19  Yes Plotnikov, Evie Lacks, MD  polyethylene glycol (MIRALAX) 17 g packet Take 17 g by mouth daily. 06/13/19  Yes Maudie Flakes, MD  simvastatin (ZOCOR) 40 MG tablet Take 1 tablet (40 mg total) by mouth at bedtime. 10/29/19 01/27/20 Yes Plotnikov, Evie Lacks, MD  tamsulosin (FLOMAX) 0.4 MG CAPS capsule Take 1 capsule (0.4 mg total) by mouth daily after breakfast. 02/27/19  Yes Plotnikov, Evie Lacks, MD  vitamin C (ASCORBIC ACID) 500 MG tablet Take 500 mg by mouth daily.   Yes [provider]  lidocaine (LIDODERM) 5 % Place 1 patch onto the skin daily. Remove & Discard patch within 12 hours or as directed by MD Patient not taking: Reported on 12/27/2019 12/20/19   Palumbo, April, MD    Allergies    Oxycodone and  Penicillins  Review of Systems   Review of Systems  Cardiovascular: Positive for chest pain.  All other systems reviewed and are negative.   Physical Exam Updated Vital Signs BP 125/70   Pulse 64   Temp (!) 97.5 F (36.4 C) (Oral)   Resp 19   SpO2 97%   Physical Exam Vitals and nursing note reviewed.  Constitutional:      Appearance: Normal appearance.  HENT:     Head: Normocephalic and atraumatic.     Right Ear: External ear normal.     Left Ear: External ear normal.     Nose: Nose normal.     Mouth/Throat:     Mouth: Mucous membranes are moist.     Pharynx: Oropharynx is clear.  Eyes:  Extraocular Movements: Extraocular movements intact.     Conjunctiva/sclera: Conjunctivae normal.     Pupils: Pupils are equal, round, and reactive to light.  Cardiovascular:     Rate and Rhythm: Normal rate and regular rhythm.     Pulses: Normal pulses.     Heart sounds: Normal heart sounds.  Pulmonary:     Effort: Pulmonary effort is normal.     Breath sounds: Normal breath sounds.  Abdominal:     General: Abdomen is flat. Bowel sounds are normal.     Palpations: Abdomen is soft.  Musculoskeletal:        General: Normal range of motion.     Cervical back: Normal range of motion and neck supple.  Skin:    General: Skin is warm.     Capillary Refill: Capillary refill takes less than 2 seconds.  Neurological:     General: No focal deficit present.     Mental Status: He is lethargic.  Psychiatric:        Mood and Affect: Mood normal.        Behavior: Behavior normal.     ED Results / Procedures / Treatments   Labs (all labs ordered are listed, but only abnormal results are displayed) Labs Reviewed  BASIC METABOLIC PANEL - Abnormal; Notable for the following components:      Result Value   Glucose, Bld 128 (*)    BUN 30 (*)    Creatinine, Ser 1.27 (*)    GFR, Estimated 52 (*)    All other components within normal limits  TROPONIN I (HIGH SENSITIVITY) - Abnormal;  Notable for the following components:   Troponin I (High Sensitivity) 24 (*)    All other components within normal limits  TROPONIN I (HIGH SENSITIVITY) - Abnormal; Notable for the following components:   Troponin I (High Sensitivity) 23 (*)    All other components within normal limits  CBC  URINALYSIS, ROUTINE W REFLEX MICROSCOPIC    EKG EKG Interpretation  Date/Time:  Thursday December 27 2019 12:18:28 EST Ventricular Rate:  61 PR Interval:    QRS Duration: 104 QT Interval:  420 QTC Calculation: 423 R Axis:   -69 Text Interpretation: Sinus rhythm Left anterior fascicular block Abnormal R-wave progression, late transition Borderline T abnormalities, lateral leads No significant change since last tracing Confirmed by Isla Pence [phone]) on 12/27/2019 12:29:12 PM   Radiology CT Angio Chest PE W and/or Wo Contrast  Result Date: 12/27/2019 CLINICAL DATA:  Chest pain EXAM: CT ANGIOGRAPHY CHEST WITH CONTRAST TECHNIQUE: Multidetector CT imaging of the chest was performed using the standard protocol during bolus administration of intravenous contrast. Multiplanar CT image reconstructions and MIPs were obtained to evaluate the vascular anatomy. CONTRAST:  44mL OMNIPAQUE IOHEXOL 350 MG/ML SOLN COMPARISON:  August 02, 2018 FINDINGS: Cardiovascular: Satisfactory opacification of the pulmonary arteries to the segmental level with limited evaluation of the lung bases secondary to respiratory motion. No evidence of pulmonary embolism. Heart is mildly enlarged. Three-vessel coronary artery atherosclerotic calcifications. Severe atherosclerotic calcifications of the tortuous aorta. Mediastinum/Nodes: No enlarged mediastinal, hilar, or axillary lymph nodes. Previously described enlarged paratracheal lymph node has decreased in size and measures 6 mm in the short axis, previously 10 mm. Thyroid is unremarkable. Trachea is unremarkable. Esophagus is unremarkable. Lungs/Pleura: Biapical pleuroparenchymal  scarring. Subpleural reticulation peripherally consistent with mild interstitial lung disease; this is unchanged in comparison to prior. No pleural effusion or pneumothorax. Upper Abdomen: Distension of the gallbladder. Partial visualization of a LEFT renal  cyst with a peripheral rim of calcification versus excreted contrast which measures 2.2 cm, unchanged. Diverticulosis. Musculoskeletal: Multilevel degenerative changes of the thoracic spine. Chronic cortical thickening of the RIGHT posterior ninth rib, likely reflecting the sequela of remote prior trauma. Review of the MIP images confirms the above findings. IMPRESSION: 1. No evidence of pulmonary embolism. 2. Three vessel coronary artery atherosclerotic calcifications. Aortic Atherosclerosis (ICD10-I70.0). Electronically Signed   By: Valentino Saxon MD   On: 12/27/2019 15:44   DG Chest Port 1 View  Result Date: 12/27/2019 CLINICAL DATA:  Chest pain EXAM: PORTABLE CHEST 1 VIEW COMPARISON:  February 17, 2019 FINDINGS: The cardiomediastinal silhouette is unchanged and mildly enlarged in contour.Tortuous thoracic aorta. Atherosclerotic calcifications of the aorta. Biapical pleuroparenchymal scarring. No pleural effusion. No pneumothorax. No acute pleuroparenchymal abnormality. Visualized abdomen is unremarkable. Multilevel degenerative changes of the thoracic spine. IMPRESSION: 1. No acute cardiopulmonary abnormality. Electronically Signed   By: Valentino Saxon MD   On: 12/27/2019 12:40    Procedures Procedures (including critical care time)  Medications Ordered in ED Medications  iohexol (OMNIPAQUE) 350 MG/ML injection 100 mL (52 mLs Intravenous Contrast Given 12/27/19 1520)    ED Course  I have reviewed the triage vital signs and the nursing notes.  Pertinent labs & imaging results that were available during my care of the patient were reviewed by me and considered in my medical decision making (see chart for details).    MDM  Rules/Calculators/A&P                          Pt is more awake and back to normal mental status.  He feels ok.  Pain could be coming from his back as he's just gone to his facility (left [facility] on 11/22) due to back pain and difficulty moving around.  Cardiac work up reassuring.  CTA did not show a PE.  Pt is stable for d/c.  Return if worse.   Final Clinical Impression(s) / ED Diagnoses Final diagnoses:  Weakness  Atypical chest pain    Rx / DC Orders ED Discharge Orders    None       Isla Pence, MD 12/27/19 1636

## 2019-12-27 NOTE — ED Notes (Signed)
Wife - [phone]   Would like to be updated .

## 2019-12-27 NOTE — ED Triage Notes (Signed)
To ED via [facility] from [facility] facility-- staff at facility stated that pt c/o chest pain- received 2 NTG for them, dropping BP to 96/58. Upon EMS arrival, BP had come up to 130/72-- stated he had chest pain - with palpation and movement-- Given MS 4mg  IV==  On arrival pt has no chest pain, is sleepy/lethargic-- does arouse to verbal stimuli and follows commands- per staff at home, normally walks and is oriented and talking.  resp shallow with periods of apnea- will respond to verbal stimuli to take deep breaths-- sats remain at 97-100% Denies any chest pain

## 2019-12-27 NOTE — ED Notes (Signed)
Notified Network engineer to set up [employer]

## 2020-01-02 DIAGNOSIS — M150 Primary generalized (osteo)arthritis: Secondary | ICD-10-CM | POA: Diagnosis not present

## 2020-01-02 DIAGNOSIS — K219 Gastro-esophageal reflux disease without esophagitis: Secondary | ICD-10-CM | POA: Diagnosis not present

## 2020-01-02 DIAGNOSIS — I2510 Atherosclerotic heart disease of native coronary artery without angina pectoris: Secondary | ICD-10-CM | POA: Diagnosis not present

## 2020-01-02 DIAGNOSIS — G894 Chronic pain syndrome: Secondary | ICD-10-CM | POA: Diagnosis not present

## 2020-01-02 DIAGNOSIS — M545 Low back pain, unspecified: Secondary | ICD-10-CM | POA: Diagnosis not present

## 2020-01-02 DIAGNOSIS — N401 Enlarged prostate with lower urinary tract symptoms: Secondary | ICD-10-CM | POA: Diagnosis not present

## 2020-01-02 DIAGNOSIS — D519 Vitamin B12 deficiency anemia, unspecified: Secondary | ICD-10-CM | POA: Diagnosis not present

## 2020-01-02 DIAGNOSIS — E559 Vitamin D deficiency, unspecified: Secondary | ICD-10-CM | POA: Diagnosis not present

## 2020-01-02 DIAGNOSIS — M6281 Muscle weakness (generalized): Secondary | ICD-10-CM | POA: Diagnosis not present

## 2020-01-02 DIAGNOSIS — R54 Age-related physical debility: Secondary | ICD-10-CM | POA: Diagnosis not present

## 2020-01-02 DIAGNOSIS — K5900 Constipation, unspecified: Secondary | ICD-10-CM | POA: Diagnosis not present

## 2020-01-02 DIAGNOSIS — D509 Iron deficiency anemia, unspecified: Secondary | ICD-10-CM | POA: Diagnosis not present

## 2020-01-09 DIAGNOSIS — M545 Low back pain, unspecified: Secondary | ICD-10-CM | POA: Diagnosis not present

## 2020-01-09 DIAGNOSIS — D509 Iron deficiency anemia, unspecified: Secondary | ICD-10-CM | POA: Diagnosis not present

## 2020-01-09 DIAGNOSIS — I2510 Atherosclerotic heart disease of native coronary artery without angina pectoris: Secondary | ICD-10-CM | POA: Diagnosis not present

## 2020-01-09 DIAGNOSIS — F419 Anxiety disorder, unspecified: Secondary | ICD-10-CM | POA: Diagnosis not present

## 2020-01-09 DIAGNOSIS — D519 Vitamin B12 deficiency anemia, unspecified: Secondary | ICD-10-CM | POA: Diagnosis not present

## 2020-01-09 DIAGNOSIS — N401 Enlarged prostate with lower urinary tract symptoms: Secondary | ICD-10-CM | POA: Diagnosis not present

## 2020-01-09 DIAGNOSIS — K219 Gastro-esophageal reflux disease without esophagitis: Secondary | ICD-10-CM | POA: Diagnosis not present

## 2020-01-09 DIAGNOSIS — R54 Age-related physical debility: Secondary | ICD-10-CM | POA: Diagnosis not present

## 2020-01-09 DIAGNOSIS — F0390 Unspecified dementia without behavioral disturbance: Secondary | ICD-10-CM | POA: Diagnosis not present

## 2020-01-09 DIAGNOSIS — M6281 Muscle weakness (generalized): Secondary | ICD-10-CM | POA: Diagnosis not present

## 2020-01-09 DIAGNOSIS — G894 Chronic pain syndrome: Secondary | ICD-10-CM | POA: Diagnosis not present

## 2020-01-09 DIAGNOSIS — M150 Primary generalized (osteo)arthritis: Secondary | ICD-10-CM | POA: Diagnosis not present

## 2020-01-13 ENCOUNTER — Emergency Department (HOSPITAL_COMMUNITY)
Admission: EM | Admit: 2020-01-13 | Discharge: 2020-01-13 | Disposition: A | Discharge status: Home / Self Care | Payer: Medicare Other | Attending: Emergency Medicine | Admitting: Emergency Medicine

## 2020-01-13 ENCOUNTER — Emergency Department (HOSPITAL_COMMUNITY): Payer: Medicare Other

## 2020-01-13 DIAGNOSIS — I10 Essential (primary) hypertension: Secondary | ICD-10-CM | POA: Insufficient documentation

## 2020-01-13 DIAGNOSIS — R404 Transient alteration of awareness: Secondary | ICD-10-CM | POA: Diagnosis not present

## 2020-01-13 DIAGNOSIS — Z7982 Long term (current) use of aspirin: Secondary | ICD-10-CM | POA: Diagnosis not present

## 2020-01-13 DIAGNOSIS — Z7401 Bed confinement status: Secondary | ICD-10-CM | POA: Diagnosis not present

## 2020-01-13 DIAGNOSIS — I2510 Atherosclerotic heart disease of native coronary artery without angina pectoris: Secondary | ICD-10-CM | POA: Insufficient documentation

## 2020-01-13 DIAGNOSIS — Z85828 Personal history of other malignant neoplasm of skin: Secondary | ICD-10-CM | POA: Diagnosis not present

## 2020-01-13 DIAGNOSIS — F0390 Unspecified dementia without behavioral disturbance: Secondary | ICD-10-CM | POA: Insufficient documentation

## 2020-01-13 DIAGNOSIS — R52 Pain, unspecified: Secondary | ICD-10-CM | POA: Diagnosis not present

## 2020-01-13 DIAGNOSIS — M2550 Pain in unspecified joint: Secondary | ICD-10-CM | POA: Diagnosis not present

## 2020-01-13 DIAGNOSIS — R0902 Hypoxemia: Secondary | ICD-10-CM | POA: Diagnosis not present

## 2020-01-13 DIAGNOSIS — R0789 Other chest pain: Secondary | ICD-10-CM | POA: Diagnosis not present

## 2020-01-13 DIAGNOSIS — R0602 Shortness of breath: Secondary | ICD-10-CM | POA: Diagnosis not present

## 2020-01-13 DIAGNOSIS — R079 Chest pain, unspecified: Secondary | ICD-10-CM | POA: Diagnosis not present

## 2020-01-13 LAB — CBC WITH DIFFERENTIAL/PLATELET
Abs Immature Granulocytes: 0.03 10*3/uL (ref 0.00–0.07)
Basophils Absolute: 0.1 10*3/uL (ref 0.0–0.1)
Basophils Relative: 1 %
Eosinophils Absolute: 0.2 10*3/uL (ref 0.0–0.5)
Eosinophils Relative: 3 %
HCT: 40.6 % (ref 39.0–52.0)
Hemoglobin: 13 g/dL (ref 13.0–17.0)
Immature Granulocytes: 1 %
Lymphocytes Relative: 25 %
Lymphs Abs: 1.5 10*3/uL (ref 0.7–4.0)
MCH: 30.7 pg (ref 26.0–34.0)
MCHC: 32 g/dL (ref 30.0–36.0)
MCV: 95.8 fL (ref 80.0–100.0)
Monocytes Absolute: 0.8 10*3/uL (ref 0.1–1.0)
Monocytes Relative: 13 %
Neutro Abs: 3.6 10*3/uL (ref 1.7–7.7)
Neutrophils Relative %: 57 %
Platelets: 209 10*3/uL (ref 150–400)
RBC: 4.24 MIL/uL (ref 4.22–5.81)
RDW: 14.6 % (ref 11.5–15.5)
WBC: 6.1 10*3/uL (ref 4.0–10.5)
nRBC: 0 % (ref 0.0–0.2)

## 2020-01-13 LAB — BASIC METABOLIC PANEL
Anion gap: 9 (ref 5–15)
BUN: 26 mg/dL — ABNORMAL HIGH (ref 8–23)
CO2: 24 mmol/L (ref 22–32)
Calcium: 8.8 mg/dL — ABNORMAL LOW (ref 8.9–10.3)
Chloride: 104 mmol/L (ref 98–111)
Creatinine, Ser: 1.17 mg/dL (ref 0.61–1.24)
GFR, Estimated: 58 mL/min — ABNORMAL LOW (ref >60)
Glucose, Bld: 103 mg/dL — ABNORMAL HIGH (ref 70–99)
Potassium: 4.3 mmol/L (ref 3.5–5.1)
Sodium: 137 mmol/L (ref 135–145)

## 2020-01-13 LAB — BRAIN NATRIURETIC PEPTIDE: B Natriuretic Peptide: 101.8 pg/mL — ABNORMAL HIGH (ref 0.0–100.0)

## 2020-01-13 LAB — TROPONIN I (HIGH SENSITIVITY)
Troponin I (High Sensitivity): 34 ng/L — ABNORMAL HIGH (ref <18)
Troponin I (High Sensitivity): 30 ng/L — ABNORMAL HIGH (ref <18)

## 2020-01-13 NOTE — ED Provider Notes (Signed)
Blood pressure (!) 160/71, pulse (!) 51, temperature (!) 97.4 F (36.3 C), temperature source Oral, resp. rate 17, SpO2 94 %.  Assuming care from Dr. Leonides Schanz.  In short, [name] is a 86 y.o. male with a chief complaint of Chest Pain .  Refer to the original H&P for additional details.  The current plan of care is to follow up after delta troponin.  08:50 AM  Updated the patient.  He remains chest pain-free.  I discussed the case with his daughter by phone.  His second troponin is unchanged from the first.  EKG with no acute ischemic change.  Plan for discharge back to his nursing facility. Family to follow with him there. Plan for close PCP follow up.    EKG Interpretation  Date/Time:  Sunday January 13 2020 04:57:01 EST Ventricular Rate:  56 PR Interval:    QRS Duration: 108 QT Interval:  449 QTC Calculation: 434 R Axis:   -67 Text Interpretation: Sinus rhythm LAD, consider left anterior fascicular block Borderline T wave abnormalities No significant change since last tracing Confirmed by Pryor Curia [phone]) on 01/13/2020 5:04:06 AM          Milika Ventress, [facility], MD 01/13/20 [phone]

## 2020-01-13 NOTE — Discharge Instructions (Addendum)
Your blood test today and other work-up did not show evidence of heart attack as a result of your chest discomfort.  Please follow closely with your primary care doctor in the coming week to discuss your ED visit and symptoms.  If you develop any new or suddenly worsening symptoms please return to the emergency department for urgent evaluation.

## 2020-01-13 NOTE — ED Notes (Addendum)
Patient returned by [facility]

## 2020-01-13 NOTE — ED Notes (Signed)
Xray in room with patient at this time.

## 2020-01-13 NOTE — ED Notes (Signed)
Attempted to call [facility] in [place], [place] to give report, but the phone just kept ringing and there was not an answering service set up.

## 2020-01-13 NOTE — ED Triage Notes (Signed)
Patient sent from [facility] due to c/o chest pain. Was given 3 SL NTG at [facility] and 324 Aspirin with EMS. Patient denies chest pain at this time. Respirations even and unlabored. Skin warm and dry. Vitals stable.

## 2020-01-13 NOTE — ED Provider Notes (Signed)
TIME SEEN: 5:04 AM  CHIEF COMPLAINT: Chest pain  HPI: Patient is a 86 year old male with history of CAD, ischemic cardiomyopathy, hypertension, dementia who presents to the emergency department with EMS from [facility] with complaints of chest pain.  Received 3 nitroglycerin at [facility] home and 324 mg of aspirin with EMS.  Patient is asymptomatic at this time.  History of dementia per EMS.  Unable to provide any information about what happened today.  It appears patient has had an unremarkable stress test and November 2021.  Also had a CTA of his chest in November 2021 that showed no PE.  Last echo was in March 2019.  ECHO 04/22/2017:  Study Conclusions   - Left ventricle: The cavity size was normal. Wall thickness was  increased in a pattern of mild LVH. Systolic function was mildly  reduced. The estimated ejection fraction was in the range of 45%  to 50%. There is akinesis of the basal-midinferoseptal  myocardium. Doppler parameters are consistent with abnormal left  ventricular relaxation (grade 1 diastolic dysfunction).  - Aortic valve: Trileaflet; mildly thickened, mildly calcified  leaflets. There was mild regurgitation.  - Mitral valve: There was mild regurgitation.  - Left atrium: The atrium was mildly dilated.  - Pulmonary arteries: Systolic pressure was mildly increased. PA  peak pressure: 48 mm Hg (S).   02/18/2019 MYOCARDIAL IMAGING WITH SPECT (REST AND PHARMACOLOGIC-STRESS):  IMPRESSION: 1. No reversible ischemia. Large scar involving the mid and basilar segments of the lateral wall and inferolateral wall.  2. Global hypokinesia.  3. Left ventricular ejection fraction 44%  4. Non invasive risk stratification*: Intermediate  12/27/19 CTA CHEST:  IMPRESSION: 1. No evidence of pulmonary embolism. 2. Three vessel coronary artery atherosclerotic calcifications.  ROS: Level 5 caveat secondary to dementia  PAST MEDICAL HISTORY/PAST  SURGICAL HISTORY:  Past Medical History:  Diagnosis Date  . CAD (coronary artery disease)    s/p BMS to LCx in 2002, DES to mid-LAD in 2007  . Dizziness and giddiness   . Hypertrophy of prostate without urinary obstruction and other lower urinary tract symptoms (LUTS)   . Ischemic cardiomyopathy   . MI (myocardial infarction) ([place]) 2002  . Mild aortic insufficiency   . Mild mitral regurgitation   . Osteoarthrosis, unspecified whether generalized or localized, unspecified site   . Other and unspecified hyperlipidemia   . Sinus bradycardia   . Skin cancer    "top of his head"  . Thrombocytopenia, unspecified ([place])   . Unspecified essential hypertension     MEDICATIONS:  Prior to Admission medications   Medication Sig Start Date End Date Taking? Authorizing Provider  acetaminophen (TYLENOL) 500 MG tablet Take 1,000 mg by mouth 4 (four) times daily.    [provider]  aspirin 81 MG EC tablet Take 81 mg by mouth daily.      [provider]  b complex vitamins tablet Take 1 tablet by mouth daily.      [provider]  bisacodyl (DULCOLAX) 10 MG suppository Place 1 suppository (10 mg total) rectally as needed for moderate constipation. 06/13/19   Maudie Flakes, MD  Cholecalciferol 1000 UNITS tablet Take 1,000 Units by mouth daily.      [provider]  finasteride (PROSCAR) 5 MG tablet Take 1 tablet (5 mg total) by mouth daily. 02/27/19   Plotnikov, Evie Lacks, MD  Glucosamine-Chondroitin 500-400 MG CAPS Take 1 capsule by mouth 2 (two) times daily.    [provider]  ibuprofen (  ADVIL) 200 MG tablet Take 200 mg by mouth every 4 (four) hours as needed (pain).    [provider]  isosorbide mononitrate (IMDUR) 30 MG 24 hr tablet Take 1 tablet (30 mg total) by mouth daily. 02/27/19   Plotnikov, Evie Lacks, MD  lidocaine (LIDODERM) 5 % Place 1 patch onto the skin daily. Remove & Discard patch within 12 hours or as directed by MD Patient not  taking: Reported on 12/27/2019 12/20/19   Palumbo, April, MD  meloxicam (MOBIC) 7.5 MG tablet Take 1 tablet (7.5 mg total) by mouth daily. Patient taking differently: Take 7.5 mg by mouth at bedtime.  12/20/19   Palumbo, April, MD  nitroGLYCERIN (NITROSTAT) 0.4 MG SL tablet Place 1 tablet (0.4 mg total) under the tongue every 5 (five) minutes as needed for chest pain (Please call your doctor/911 if no improvement after the second dose). 02/19/19   Plotnikov, Evie Lacks, MD  pantoprazole (PROTONIX) 40 MG tablet Take 1 tablet (40 mg total) by mouth daily. Patient taking differently: Take 40 mg by mouth daily at 6 (six) AM.  02/27/19   Plotnikov, Evie Lacks, MD  polyethylene glycol (MIRALAX) 17 g packet Take 17 g by mouth daily. 06/13/19   Maudie Flakes, MD  simvastatin (ZOCOR) 40 MG tablet Take 1 tablet (40 mg total) by mouth at bedtime. 10/29/19 01/27/20  Plotnikov, Evie Lacks, MD  tamsulosin (FLOMAX) 0.4 MG CAPS capsule Take 1 capsule (0.4 mg total) by mouth daily after breakfast. 02/27/19   Plotnikov, Evie Lacks, MD  vitamin C (ASCORBIC ACID) 500 MG tablet Take 500 mg by mouth daily.    [provider]    ALLERGIES:  Allergies  Allergen Reactions  . Oxycodone Nausea And Vomiting  . Penicillins Hives    Did it involve swelling of the face/tongue/throat, SOB, or low BP? Unknown Did it involve sudden or severe rash/hives, skin peeling, or any reaction on the inside of your mouth or nose? Yes Did you need to seek medical attention at a hospital or doctor's office? Unknown When did it last happen?86 yrs old If all above answers are "NO", may proceed with cephalosporin use.    SOCIAL HISTORY:  Social History   Tobacco Use  . Smoking status: Never Smoker  . Smokeless tobacco: Never Used  Substance Use Topics  . Alcohol use: No    Alcohol/week: 0.0 standard drinks    FAMILY HISTORY: Family History  Problem Relation Age of Onset  . Heart disease Father   . Coronary artery  disease Other   . Colon cancer Neg Hx     EXAM: BP (!) 168/70   Pulse (!) 54   Temp (!) 97.4 F (36.3 C) (Oral)   Resp 18   SpO2 99%  CONSTITUTIONAL: Alert and oriented person and place but not time or situation and responds appropriately to questions.  Elderly, afebrile, in no distress HEAD: Normocephalic EYES: Conjunctivae clear, pupils appear equal, EOM appear intact ENT: normal nose; moist mucous membranes NECK: Supple, normal ROM CARD: RRR; S1 and S2 appreciated; no murmurs, no clicks, no rubs, no gallops RESP: Normal chest excursion without splinting or tachypnea; breath sounds clear and equal bilaterally; no wheezes, no rhonchi, no rales, no hypoxia or respiratory distress, speaking full sentences ABD/GI: Normal bowel sounds; non-distended; soft, non-tender, no rebound, no guarding, no peritoneal signs, no hepatosplenomegaly BACK:  The back appears normal EXT: Normal ROM in all joints; no deformity noted, no edema; no cyanosis, no calf tenderness or calf  swelling SKIN: Normal color for age and race; warm; no rash on exposed skin NEURO: Moves all extremities equally PSYCH: The patient's mood and manner are appropriate.   MEDICAL DECISION MAKING: Patient here with episode of chest pain.  His EKG is unchanged compared to previous with no new ischemic abnormality.  He is unable to provide any history about what occurred today.  I have attempted to contact [facility] [phone] without anyone answering the phone.  Will obtain cardiac labs, chest x-ray.  He is asymptomatic at this time.  Doubt PE, dissection.  Does not appear volume overloaded.  ED PROGRESS: Labs show minimally elevated troponin and BNP.  Chest x-ray clear.  Troponin was also minimally elevated during visit on 12/27/2019.  Second troponin pending.  Anticipate that if this is flat and patient is still asymptomatic and hemodynamically stable that he can be discharged back to his nursing facility.  I  have attempted to get in contact with [facility] multiple times without any answer.   Signed out to oncoming ED physician to follow-up once patient second troponin.   I was able to get in touch patient's wife, [name] at [phone].  She is comfortable with plan for patient to be discharged home with outpatient follow-up with his PCP and cardiologist if second troponin is unchanged.  I reviewed all nursing notes and pertinent previous records as available.  I have reviewed and interpreted any EKGs, lab and urine results, imaging (as available).    EKG Interpretation  Date/Time:  Sunday January 13 2020 04:57:01 EST Ventricular Rate:  56 PR Interval:    QRS Duration: 108 QT Interval:  449 QTC Calculation: 434 R Axis:   -67 Text Interpretation: Sinus rhythm LAD, consider left anterior fascicular block Borderline T wave abnormalities No significant change since last tracing Confirmed by Pryor Curia [phone]) on 01/13/2020 5:04:06 AM           Cecilie Lowers was evaluated in Emergency Department on 01/13/2020 for the symptoms described in the history of present illness. He was evaluated in the context of the global COVID-19 pandemic, which necessitated consideration that the patient might be at risk for infection with the SARS-CoV-2 virus that causes COVID-19. Institutional protocols and algorithms that pertain to the evaluation of patients at risk for COVID-19 are in a state of rapid change based on information released by regulatory bodies including the CDC and federal and state organizations. These policies and algorithms were followed during the patient's care in the ED.      Cheryl Chay, Delice Bison, DO 01/13/20 [phone]

## 2020-02-05 DIAGNOSIS — R0981 Nasal congestion: Secondary | ICD-10-CM | POA: Diagnosis not present

## 2020-02-05 DIAGNOSIS — U071 COVID-19: Secondary | ICD-10-CM | POA: Diagnosis not present

## 2020-02-06 DIAGNOSIS — U071 COVID-19: Secondary | ICD-10-CM | POA: Diagnosis not present

## 2020-02-06 DIAGNOSIS — I2510 Atherosclerotic heart disease of native coronary artery without angina pectoris: Secondary | ICD-10-CM | POA: Diagnosis not present

## 2020-02-20 DIAGNOSIS — F0390 Unspecified dementia without behavioral disturbance: Secondary | ICD-10-CM | POA: Diagnosis not present

## 2020-02-22 DIAGNOSIS — F064 Anxiety disorder due to known physiological condition: Secondary | ICD-10-CM | POA: Diagnosis not present

## 2020-02-22 DIAGNOSIS — Z8616 Personal history of COVID-19: Secondary | ICD-10-CM | POA: Diagnosis not present

## 2020-02-22 DIAGNOSIS — I2510 Atherosclerotic heart disease of native coronary artery without angina pectoris: Secondary | ICD-10-CM | POA: Diagnosis not present

## 2020-02-22 DIAGNOSIS — M6281 Muscle weakness (generalized): Secondary | ICD-10-CM | POA: Diagnosis not present

## 2020-02-22 DIAGNOSIS — I10 Essential (primary) hypertension: Secondary | ICD-10-CM | POA: Diagnosis not present

## 2020-02-22 DIAGNOSIS — N401 Enlarged prostate with lower urinary tract symptoms: Secondary | ICD-10-CM | POA: Diagnosis not present

## 2020-02-22 DIAGNOSIS — R2689 Other abnormalities of gait and mobility: Secondary | ICD-10-CM | POA: Diagnosis not present

## 2020-02-22 DIAGNOSIS — R4182 Altered mental status, unspecified: Secondary | ICD-10-CM | POA: Diagnosis not present

## 2020-02-25 ENCOUNTER — Ambulatory Visit (INDEPENDENT_AMBULATORY_CARE_PROVIDER_SITE_OTHER): Payer: Medicare Other | Admitting: Internal Medicine

## 2020-02-25 ENCOUNTER — Ambulatory Visit (INDEPENDENT_AMBULATORY_CARE_PROVIDER_SITE_OTHER): Payer: Medicare Other | Admitting: Family Medicine

## 2020-02-25 ENCOUNTER — Encounter: Payer: Self-pay | Admitting: Internal Medicine

## 2020-02-25 ENCOUNTER — Other Ambulatory Visit: Payer: Self-pay

## 2020-02-25 VITALS — BP 100/58 | HR 68 | Temp 98.7°F | Resp 16

## 2020-02-25 VITALS — BP 110/60 | HR 60

## 2020-02-25 DIAGNOSIS — I2510 Atherosclerotic heart disease of native coronary artery without angina pectoris: Secondary | ICD-10-CM

## 2020-02-25 DIAGNOSIS — F0281 Dementia in other diseases classified elsewhere with behavioral disturbance: Secondary | ICD-10-CM | POA: Diagnosis not present

## 2020-02-25 DIAGNOSIS — R410 Disorientation, unspecified: Secondary | ICD-10-CM

## 2020-02-25 DIAGNOSIS — R269 Unspecified abnormalities of gait and mobility: Secondary | ICD-10-CM | POA: Diagnosis not present

## 2020-02-25 DIAGNOSIS — G301 Alzheimer's disease with late onset: Secondary | ICD-10-CM

## 2020-02-25 DIAGNOSIS — E875 Hyperkalemia: Secondary | ICD-10-CM | POA: Diagnosis not present

## 2020-02-25 DIAGNOSIS — T148XXA Other injury of unspecified body region, initial encounter: Secondary | ICD-10-CM

## 2020-02-25 LAB — CBC WITH DIFFERENTIAL/PLATELET
Basophils Absolute: 0 10*3/uL (ref 0.0–0.1)
Basophils Relative: 0.5 % (ref 0.0–3.0)
Eosinophils Absolute: 0.1 10*3/uL (ref 0.0–0.7)
Eosinophils Relative: 1.4 % (ref 0.0–5.0)
HCT: 37.8 % — ABNORMAL LOW (ref 39.0–52.0)
Hemoglobin: 12.8 g/dL — ABNORMAL LOW (ref 13.0–17.0)
Lymphocytes Relative: 20.8 % (ref 12.0–46.0)
Lymphs Abs: 1.5 10*3/uL (ref 0.7–4.0)
MCHC: 33.9 g/dL (ref 30.0–36.0)
MCV: 95.7 fl (ref 78.0–100.0)
Monocytes Absolute: 0.8 10*3/uL (ref 0.1–1.0)
Monocytes Relative: 11.6 % (ref 3.0–12.0)
Neutro Abs: 4.8 10*3/uL (ref 1.4–7.7)
Neutrophils Relative %: 65.7 % (ref 43.0–77.0)
Platelets: 229 10*3/uL (ref 150.0–400.0)
RBC: 3.95 Mil/uL — ABNORMAL LOW (ref 4.22–5.81)
RDW: 17.4 % — ABNORMAL HIGH (ref 11.5–15.5)
WBC: 7.3 10*3/uL (ref 4.0–10.5)

## 2020-02-25 LAB — COMPREHENSIVE METABOLIC PANEL
ALT: 16 U/L (ref 0–53)
AST: 22 U/L (ref 0–37)
Albumin: 3.8 g/dL (ref 3.5–5.2)
Alkaline Phosphatase: 54 U/L (ref 39–117)
BUN: 27 mg/dL — ABNORMAL HIGH (ref 6–23)
CO2: 28 mEq/L (ref 19–32)
Calcium: 9.5 mg/dL (ref 8.4–10.5)
Chloride: 102 mEq/L (ref 96–112)
Creatinine, Ser: 1.1 mg/dL (ref 0.40–1.50)
GFR: 57.47 mL/min — ABNORMAL LOW (ref >60.00)
Glucose, Bld: 112 mg/dL — ABNORMAL HIGH (ref 70–99)
Potassium: 4.3 mEq/L (ref 3.5–5.1)
Sodium: 137 mEq/L (ref 135–145)
Total Bilirubin: 1.1 mg/dL (ref 0.2–1.2)
Total Protein: 6.9 g/dL (ref 6.0–8.3)

## 2020-02-25 LAB — TSH: TSH: 2.93 u[IU]/mL (ref 0.35–4.50)

## 2020-02-25 MED ORDER — CLOTRIMAZOLE-BETAMETHASONE 1-0.05 % EX CREA: 1.0000 "application " | TOPICAL_CREAM | Freq: Two times a day (BID) | CUTANEOUS | 1 refills | Status: AC

## 2020-02-25 MED ORDER — ACETAMINOPHEN ER 650 MG PO TBCR: 650.0000 mg | EXTENDED_RELEASE_TABLET | Freq: Two times a day (BID) | ORAL | 3 refills | Status: AC

## 2020-02-25 NOTE — Progress Notes (Signed)
   I, Peterson Lombard, LAT, ATC acting as a scribe for Lynne Leader, MD.  Subjective:    CC: Fall  HPI: Pt is a 87 y/o male while present to clinic today following appointment w/ PCP.  Following the appointment he was checking out in the lobby and turned while standing with his walker and fell landing on his right elbow.  He suffered a shallow skin tear of the right lateral elbow.  He says that he feels well otherwise and denies any other pain.  He did not hit his head denies any neck pain or headache.  His family who witnessed the fall did not think he hit his head either.  He lives in a care home that can provide medical care.     Pertinent review of Systems: No fevers or chills.  Relevant historical information: Alzheimer's   Objective:    Vitals:   02/25/20 1240  BP: 110/60  Pulse: 60   General: Well Developed, well nourished, and in no acute distress.   MSK: Small shallow skin tear right lateral elbow. Elbow is nontender with normal motion and strength. Distal upper extremities are nontender with normal strength and grip. C-spine nontender. L-spine nontender. Hip is nontender.  Patient can stand and ambulate using a walker.   Impression and Recommendations:    Assessment and Plan: 87 y.o. male with fall with shallow skin tear right elbow.  Clinically doing well otherwise.  Skin tear was dressed appropriately.  Check back with myself or primary care provider as needed.[name]  PDMP not reviewed this encounter. No orders of the defined types were placed in this encounter.  No orders of the defined types were placed in this encounter.   Discussed warning signs or symptoms. Please see discharge instructions. Patient expresses understanding.   The above documentation has been reviewed and is accurate and complete Lynne Leader, M.D.

## 2020-02-25 NOTE — Assessment & Plan Note (Signed)
Labs CBC

## 2020-02-25 NOTE — Assessment & Plan Note (Signed)
CBC

## 2020-02-25 NOTE — Patient Instructions (Addendum)
Thank you for coming in today.  Follow up with Plotnikov, Evie Lacks, MD as needed.   Use non-adherent dressings.   I am happy to see you as needed as well.    Skin Tear A skin tear is a wound in which the top layers of skin have peeled off from the deeper skin or tissues underneath. This is a common problem as people get older because the skin becomes thinner and more fragile. In addition, some medicines, such as oral corticosteroids, can lead to thinning skin if they are taken for long periods of time. A skin tear is often repaired with tape or skin adhesive strips. Depending on the location of the wound, a bandage (dressing) may be applied over the tape or adhesive strips. Follow these instructions at home: Wound care  Clean the wound as told by your health care provider. You may be instructed to keep the wound dry for the first few days. If you are told to clean the wound: ? Wash the wound as told by your health care provider. This may include using mild soap and water, a wound cleanser, or a salt-water (saline) solution. ? If using soap, rinse the wound with water to remove all soap. ? Do not rub the wound dry. Pat it gently with a clean towel or let it air-dry.  Change any dressings as told by your health care provider. This may include changing the dressing if it gets wet, gets dirty, or starts to smell bad. ? Wash your hands with soap and water for at least 20 seconds before and after you change your bandage (dressing). If soap and water are not available, use hand sanitizer. ? Leave tape or skin adhesive strips in place. These skin closures may need to stay in place for 2 weeks or longer. If adhesive strip edges start to loosen and curl up, you may trim the loose edges. Do not remove adhesive strips completely unless your health care provider tells you to do that.  Check your wound every day for signs of infection. Check for: ? Redness, swelling, or pain. ? More fluid or  blood. ? Warmth. ? Pus or a bad smell.  Do not scratch or pick at the wound.  Protect the injured area until it has healed.   Medicines  Take or apply over-the-counter and prescription medicines only as told by your health care provider.  If you were prescribed an antibiotic medicine, take or apply it as told by your health care provider. Do not stop using the antibiotic even if your condition improves. General instructions  Keep the dressing dry as told by your health care provider.  Do not take baths, swim, use a hot tub, or do anything that puts your wound underwater until your health care provider approves. Ask your health care provider if you may take showers. You may only be allowed to take sponge baths.  Keep all follow-up visits. This is important.   Contact a health care provider if:  You have redness, swelling, or pain around your wound.  You have more fluid or blood coming from your wound.  Your wound, or the area around your wound, feels warm to the touch.  You have pus or a bad smell coming from your wound. Get help right away if:  You have a red streak that goes away from the skin tear.  You have a fever and chills, and your symptoms suddenly get worse. Summary  A skin tear is a wound  in which the top layers of skin have peeled off from the deeper skin or tissues underneath.  A skin tear is often repaired with tape or skin adhesive strips, and a bandage (dressing) may be applied over the tape or the adhesive strips.  Change any dressings as told by your health care provider.  Take or apply over-the-counter and prescription medicines only as told by your health care provider.  Contact a health care provider if you have signs of infection. This information is not intended to replace advice given to you by your health care provider. Make sure you discuss any questions you have with your health care provider. Document Revised: 04/25/2019 Document Reviewed:  04/25/2019 Elsevier Patient Education  [employer].

## 2020-02-25 NOTE — Assessment & Plan Note (Signed)
No angina 

## 2020-02-25 NOTE — Progress Notes (Signed)
Subjective:  Patient ID: [name], male    DOB: 1923-07-05  Age: 87 y.o. MRN: [id number]  CC: Hypertension (Here for follow up) and Dementia (Living at [facility])   HPI [name] presents for dementia - at [facility], weakness  Outpatient Medications Prior to Visit  Medication Sig Dispense Refill  . acetaminophen (TYLENOL) 500 MG tablet Take 1,000 mg by mouth 4 (four) times daily.    [name] aspirin 81 MG EC tablet Take 81 mg by mouth daily.      [name] b complex vitamins tablet Take 1 tablet by mouth daily.      . bisacodyl (DULCOLAX) 10 MG suppository Place 1 suppository (10 mg total) rectally as needed for moderate constipation. 12 suppository 1  . Cholecalciferol 1000 UNITS tablet Take 1,000 Units by mouth daily.      . finasteride (PROSCAR) 5 MG tablet Take 1 tablet (5 mg total) by mouth daily. 90 tablet 3  . Glucosamine-Chondroitin 500-400 MG CAPS Take 1 capsule by mouth 2 (two) times daily.    [name] ibuprofen (ADVIL) 200 MG tablet Take 200 mg by mouth every 4 (four) hours as needed (pain).    . isosorbide mononitrate (IMDUR) 30 MG 24 hr tablet Take 1 tablet (30 mg total) by mouth daily. 90 tablet 3  . lidocaine (LIDODERM) 5 % Place 1 patch onto the skin daily. Remove & Discard patch within 12 hours or as directed by MD (Patient not taking: Reported on 12/27/2019) 30 patch 0  . meloxicam (MOBIC) 7.5 MG tablet Take 1 tablet (7.5 mg total) by mouth daily. (Patient taking differently: Take 7.5 mg by mouth at bedtime. ) 5 tablet 0  . nitroGLYCERIN (NITROSTAT) 0.4 MG SL tablet Place 1 tablet (0.4 mg total) under the tongue every 5 (five) minutes as needed for chest pain (Please call your doctor/911 if no improvement after the second dose). 25 tablet 0  . pantoprazole (PROTONIX) 40 MG tablet Take 1 tablet (40 mg total) by mouth daily. (Patient taking differently: Take 40 mg by mouth daily at 6 (six) AM. ) 90 tablet 3  . polyethylene glycol (MIRALAX) 17 g packet  Take 17 g by mouth daily. 30 each 1  . simvastatin (ZOCOR) 40 MG tablet Take 1 tablet (40 mg total) by mouth at bedtime. 90 tablet 3  . tamsulosin (FLOMAX) 0.4 MG CAPS capsule Take 1 capsule (0.4 mg total) by mouth daily after breakfast. 90 capsule 3  . vitamin C (ASCORBIC ACID) 500 MG tablet Take 500 mg by mouth daily.     No facility-administered medications prior to visit.    ROS: Review of Systems  Constitutional: Negative for appetite change, fatigue and unexpected weight change.  HENT: Negative for congestion, nosebleeds, sneezing, sore throat and trouble swallowing.   Eyes: Negative for itching and visual disturbance.  Respiratory: Negative for cough.   Cardiovascular: Negative for chest pain, palpitations and leg swelling.  Gastrointestinal: Negative for abdominal distention, blood in stool, diarrhea and nausea.  Genitourinary: Negative for frequency and hematuria.  Musculoskeletal: Positive for gait problem. Negative for back pain, joint swelling and neck pain.  Skin: Negative for rash.  Neurological: Negative for dizziness, tremors, speech difficulty and weakness.  Psychiatric/Behavioral: Positive for behavioral problems, confusion and decreased concentration. Negative for agitation, dysphoric mood, sleep disturbance and suicidal ideas. The patient is not nervous/anxious.     Objective:  BP (!) 100/58 (BP Location: Left Arm, Patient Position: Sitting, Cuff Size: Small)  Pulse 68   Temp 98.7 F (37.1 C) (Oral)   Resp 16   SpO2 94%   BP Readings from Last 3 Encounters:  02/25/20 (!) 100/58  01/13/20 (!) 178/81  12/27/19 135/62    Wt Readings from Last 3 Encounters:  12/21/19 145 lb (65.8 kg)  12/20/19 145 lb (65.8 kg)  11/16/19 151 lb 2 oz (68.5 kg)    Physical Exam Constitutional:      General: He is not in acute distress.    Appearance: He is well-developed.     Comments: NAD  HENT:     Mouth/Throat:     Mouth: Oropharynx is clear and moist.  Eyes:      Conjunctiva/sclera: Conjunctivae normal.     Pupils: Pupils are equal, round, and reactive to light.  Neck:     Thyroid: No thyromegaly.     Vascular: No JVD.  Cardiovascular:     Rate and Rhythm: Normal rate and regular rhythm.     Pulses: Intact distal pulses.     Heart sounds: Normal heart sounds. No murmur heard. No friction rub. No gallop.   Pulmonary:     Effort: Pulmonary effort is normal. No respiratory distress.     Breath sounds: Normal breath sounds. No wheezing or rales.  Chest:     Chest wall: No tenderness.  Abdominal:     General: Bowel sounds are normal. There is no distension.     Palpations: Abdomen is soft. There is no mass.     Tenderness: There is no abdominal tenderness. There is no guarding or rebound.  Musculoskeletal:        General: Tenderness present. No edema. Normal range of motion.     Cervical back: Normal range of motion.  Lymphadenopathy:     Cervical: No cervical adenopathy.  Skin:    General: Skin is warm and dry.     Findings: No rash.  Neurological:     Mental Status: He is alert and oriented to person, place, and time.     Cranial Nerves: No cranial nerve deficit.     Motor: No abnormal muscle tone.     Coordination: He displays a negative Romberg sign. Coordination normal.     Gait: Gait normal.     Deep Tendon Reflexes: Reflexes are normal and symmetric.  Psychiatric:        Mood and Affect: Mood and affect normal.        Behavior: Behavior normal.        Thought Content: Thought content normal.        Judgment: Judgment normal.   intertrigo - between upper buttocks, 2 mm erosions [name] Somnolent     A total time of >45 minutes was spent preparing to see the patient, reviewing tests, x-rays, operative reports and ER records.  Also, obtaining history and performing comprehensive from wife and dtr, physical exam.  Additionally, counseling the patient regarding the above listed issues.   Finally, documenting clinical information in  the health records, coordination of care, educating the patient. It is a complex case.  Per report: Unfortunately [name] fell at the lab. No LOC. He was subsequently checked out by Dr. Georgina Snell.  Lab Results  Component Value Date   WBC 6.1 01/13/2020   HGB 13.0 01/13/2020   HCT 40.6 01/13/2020   PLT 209 01/13/2020   GLUCOSE 103 (H) 01/13/2020   CHOL 159 04/22/2017   TRIG 68 04/22/2017   HDL 72 04/22/2017   LDLCALC 73 04/22/2017  ALT 14 10/29/2019   AST 22 10/29/2019   NA 137 01/13/2020   K 4.3 01/13/2020   CL 104 01/13/2020   CREATININE 1.17 01/13/2020   BUN 26 (H) 01/13/2020   CO2 24 01/13/2020   TSH 3.57 12/15/2017   PSA 6.45 (H) 08/07/2013   INR 1.1 08/02/2018   HGBA1C 6.4 12/05/2012    DG Chest Portable 1 View  Result Date: 01/13/2020 CLINICAL DATA:  Chest pain EXAM: PORTABLE CHEST 1 VIEW COMPARISON:  12/27/2019 FINDINGS: Normal mediastinum and cardiac silhouette. Normal pulmonary vasculature. No evidence of effusion, infiltrate, or pneumothorax. No acute bony abnormality. Atherosclerotic calcification of the aorta. IMPRESSION: No acute cardiopulmonary process. Electronically Signed   By: Suzy Bouchard M.D.   On: 01/13/2020 06:14    Assessment & Plan:     Follow-up: No follow-ups on file.  [name] Kehr, MD

## 2020-02-25 NOTE — Assessment & Plan Note (Signed)
[name]

## 2020-02-26 DIAGNOSIS — I10 Essential (primary) hypertension: Secondary | ICD-10-CM | POA: Diagnosis not present

## 2020-02-26 DIAGNOSIS — D649 Anemia, unspecified: Secondary | ICD-10-CM | POA: Diagnosis not present

## 2020-02-26 DIAGNOSIS — Z5181 Encounter for therapeutic drug level monitoring: Secondary | ICD-10-CM | POA: Diagnosis not present

## 2020-02-28 DIAGNOSIS — I2510 Atherosclerotic heart disease of native coronary artery without angina pectoris: Secondary | ICD-10-CM | POA: Diagnosis not present

## 2020-02-28 DIAGNOSIS — I10 Essential (primary) hypertension: Secondary | ICD-10-CM | POA: Diagnosis not present

## 2020-02-28 DIAGNOSIS — E785 Hyperlipidemia, unspecified: Secondary | ICD-10-CM | POA: Diagnosis not present

## 2020-02-29 DIAGNOSIS — I2510 Atherosclerotic heart disease of native coronary artery without angina pectoris: Secondary | ICD-10-CM | POA: Diagnosis not present

## 2020-03-10 DIAGNOSIS — F419 Anxiety disorder, unspecified: Secondary | ICD-10-CM | POA: Diagnosis not present

## 2020-03-10 DIAGNOSIS — F0390 Unspecified dementia without behavioral disturbance: Secondary | ICD-10-CM | POA: Diagnosis not present

## 2020-03-19 DIAGNOSIS — F0390 Unspecified dementia without behavioral disturbance: Secondary | ICD-10-CM | POA: Diagnosis not present

## 2020-03-25 DIAGNOSIS — L299 Pruritus, unspecified: Secondary | ICD-10-CM | POA: Diagnosis not present

## 2020-03-25 DIAGNOSIS — L98411 Non-pressure chronic ulcer of buttock limited to breakdown of skin: Secondary | ICD-10-CM | POA: Diagnosis not present

## 2020-03-25 DIAGNOSIS — L820 Inflamed seborrheic keratosis: Secondary | ICD-10-CM | POA: Diagnosis not present

## 2020-03-31 DIAGNOSIS — F0390 Unspecified dementia without behavioral disturbance: Secondary | ICD-10-CM | POA: Diagnosis not present

## 2020-03-31 DIAGNOSIS — F419 Anxiety disorder, unspecified: Secondary | ICD-10-CM | POA: Diagnosis not present

## 2020-04-16 DIAGNOSIS — F0390 Unspecified dementia without behavioral disturbance: Secondary | ICD-10-CM | POA: Diagnosis not present

## 2020-04-21 DIAGNOSIS — F419 Anxiety disorder, unspecified: Secondary | ICD-10-CM | POA: Diagnosis not present

## 2020-04-21 DIAGNOSIS — F0390 Unspecified dementia without behavioral disturbance: Secondary | ICD-10-CM | POA: Diagnosis not present

## 2020-05-05 DIAGNOSIS — F0390 Unspecified dementia without behavioral disturbance: Secondary | ICD-10-CM | POA: Diagnosis not present

## 2020-05-05 DIAGNOSIS — F419 Anxiety disorder, unspecified: Secondary | ICD-10-CM | POA: Diagnosis not present

## 2020-05-14 DIAGNOSIS — F0390 Unspecified dementia without behavioral disturbance: Secondary | ICD-10-CM | POA: Diagnosis not present

## 2020-05-14 DIAGNOSIS — F419 Anxiety disorder, unspecified: Secondary | ICD-10-CM | POA: Diagnosis not present

## 2020-05-20 DIAGNOSIS — Z043 Encounter for examination and observation following other accident: Secondary | ICD-10-CM | POA: Diagnosis not present

## 2020-05-20 DIAGNOSIS — M47816 Spondylosis without myelopathy or radiculopathy, lumbar region: Secondary | ICD-10-CM | POA: Diagnosis not present

## 2020-05-21 DIAGNOSIS — M545 Low back pain, unspecified: Secondary | ICD-10-CM | POA: Diagnosis not present

## 2020-05-21 DIAGNOSIS — N400 Enlarged prostate without lower urinary tract symptoms: Secondary | ICD-10-CM | POA: Diagnosis not present

## 2020-05-21 DIAGNOSIS — R531 Weakness: Secondary | ICD-10-CM | POA: Diagnosis not present

## 2020-05-22 DIAGNOSIS — N390 Urinary tract infection, site not specified: Secondary | ICD-10-CM | POA: Diagnosis not present

## 2020-05-22 DIAGNOSIS — Z5181 Encounter for therapeutic drug level monitoring: Secondary | ICD-10-CM | POA: Diagnosis not present

## 2020-05-23 DIAGNOSIS — R296 Repeated falls: Secondary | ICD-10-CM | POA: Diagnosis not present

## 2020-05-23 DIAGNOSIS — R531 Weakness: Secondary | ICD-10-CM | POA: Diagnosis not present

## 2020-05-23 DIAGNOSIS — N3000 Acute cystitis without hematuria: Secondary | ICD-10-CM | POA: Diagnosis not present

## 2020-05-27 DIAGNOSIS — M5459 Other low back pain: Secondary | ICD-10-CM | POA: Diagnosis not present

## 2020-05-29 DIAGNOSIS — M5459 Other low back pain: Secondary | ICD-10-CM | POA: Diagnosis not present

## 2020-05-30 DIAGNOSIS — M5459 Other low back pain: Secondary | ICD-10-CM | POA: Diagnosis not present

## 2020-06-03 DIAGNOSIS — M5459 Other low back pain: Secondary | ICD-10-CM | POA: Diagnosis not present

## 2020-06-03 DIAGNOSIS — R296 Repeated falls: Secondary | ICD-10-CM | POA: Diagnosis not present

## 2020-06-03 DIAGNOSIS — R2689 Other abnormalities of gait and mobility: Secondary | ICD-10-CM | POA: Diagnosis not present

## 2020-06-03 DIAGNOSIS — I2510 Atherosclerotic heart disease of native coronary artery without angina pectoris: Secondary | ICD-10-CM | POA: Diagnosis not present

## 2020-06-05 DIAGNOSIS — M79672 Pain in left foot: Secondary | ICD-10-CM | POA: Diagnosis not present

## 2020-06-05 DIAGNOSIS — M5459 Other low back pain: Secondary | ICD-10-CM | POA: Diagnosis not present

## 2020-06-05 DIAGNOSIS — R296 Repeated falls: Secondary | ICD-10-CM | POA: Diagnosis not present

## 2020-06-05 DIAGNOSIS — M25572 Pain in left ankle and joints of left foot: Secondary | ICD-10-CM | POA: Diagnosis not present

## 2020-06-05 DIAGNOSIS — I2510 Atherosclerotic heart disease of native coronary artery without angina pectoris: Secondary | ICD-10-CM | POA: Diagnosis not present

## 2020-06-05 DIAGNOSIS — R2689 Other abnormalities of gait and mobility: Secondary | ICD-10-CM | POA: Diagnosis not present

## 2020-06-06 DIAGNOSIS — R296 Repeated falls: Secondary | ICD-10-CM | POA: Diagnosis not present

## 2020-06-06 DIAGNOSIS — R0602 Shortness of breath: Secondary | ICD-10-CM | POA: Diagnosis not present

## 2020-06-06 DIAGNOSIS — R531 Weakness: Secondary | ICD-10-CM | POA: Diagnosis not present

## 2020-06-06 DIAGNOSIS — I2510 Atherosclerotic heart disease of native coronary artery without angina pectoris: Secondary | ICD-10-CM | POA: Diagnosis not present

## 2020-06-06 DIAGNOSIS — M5459 Other low back pain: Secondary | ICD-10-CM | POA: Diagnosis not present

## 2020-06-06 DIAGNOSIS — R2689 Other abnormalities of gait and mobility: Secondary | ICD-10-CM | POA: Diagnosis not present

## 2020-06-10 DIAGNOSIS — I2510 Atherosclerotic heart disease of native coronary artery without angina pectoris: Secondary | ICD-10-CM | POA: Diagnosis not present

## 2020-06-10 DIAGNOSIS — R2689 Other abnormalities of gait and mobility: Secondary | ICD-10-CM | POA: Diagnosis not present

## 2020-06-10 DIAGNOSIS — R296 Repeated falls: Secondary | ICD-10-CM | POA: Diagnosis not present

## 2020-06-10 DIAGNOSIS — M5459 Other low back pain: Secondary | ICD-10-CM | POA: Diagnosis not present

## 2020-06-12 DIAGNOSIS — R2689 Other abnormalities of gait and mobility: Secondary | ICD-10-CM | POA: Diagnosis not present

## 2020-06-12 DIAGNOSIS — I2510 Atherosclerotic heart disease of native coronary artery without angina pectoris: Secondary | ICD-10-CM | POA: Diagnosis not present

## 2020-06-12 DIAGNOSIS — R296 Repeated falls: Secondary | ICD-10-CM | POA: Diagnosis not present

## 2020-06-12 DIAGNOSIS — M5459 Other low back pain: Secondary | ICD-10-CM | POA: Diagnosis not present

## 2020-06-13 DIAGNOSIS — M5459 Other low back pain: Secondary | ICD-10-CM | POA: Diagnosis not present

## 2020-06-13 DIAGNOSIS — R2689 Other abnormalities of gait and mobility: Secondary | ICD-10-CM | POA: Diagnosis not present

## 2020-06-13 DIAGNOSIS — I2510 Atherosclerotic heart disease of native coronary artery without angina pectoris: Secondary | ICD-10-CM | POA: Diagnosis not present

## 2020-06-13 DIAGNOSIS — R296 Repeated falls: Secondary | ICD-10-CM | POA: Diagnosis not present

## 2020-06-17 DIAGNOSIS — I2510 Atherosclerotic heart disease of native coronary artery without angina pectoris: Secondary | ICD-10-CM | POA: Diagnosis not present

## 2020-06-17 DIAGNOSIS — Z1331 Encounter for screening for depression: Secondary | ICD-10-CM | POA: Diagnosis not present

## 2020-06-17 DIAGNOSIS — M545 Low back pain, unspecified: Secondary | ICD-10-CM | POA: Diagnosis not present

## 2020-06-17 DIAGNOSIS — K219 Gastro-esophageal reflux disease without esophagitis: Secondary | ICD-10-CM | POA: Diagnosis not present

## 2020-06-17 DIAGNOSIS — N400 Enlarged prostate without lower urinary tract symptoms: Secondary | ICD-10-CM | POA: Diagnosis not present

## 2020-06-17 DIAGNOSIS — F0150 Vascular dementia without behavioral disturbance: Secondary | ICD-10-CM | POA: Diagnosis not present

## 2020-06-17 DIAGNOSIS — R2689 Other abnormalities of gait and mobility: Secondary | ICD-10-CM | POA: Diagnosis not present

## 2020-06-17 DIAGNOSIS — M5459 Other low back pain: Secondary | ICD-10-CM | POA: Diagnosis not present

## 2020-06-17 DIAGNOSIS — R296 Repeated falls: Secondary | ICD-10-CM | POA: Diagnosis not present

## 2020-06-19 DIAGNOSIS — R2689 Other abnormalities of gait and mobility: Secondary | ICD-10-CM | POA: Diagnosis not present

## 2020-06-19 DIAGNOSIS — N390 Urinary tract infection, site not specified: Secondary | ICD-10-CM | POA: Diagnosis not present

## 2020-06-19 DIAGNOSIS — D649 Anemia, unspecified: Secondary | ICD-10-CM | POA: Diagnosis not present

## 2020-06-19 DIAGNOSIS — M5459 Other low back pain: Secondary | ICD-10-CM | POA: Diagnosis not present

## 2020-06-19 DIAGNOSIS — R296 Repeated falls: Secondary | ICD-10-CM | POA: Diagnosis not present

## 2020-06-19 DIAGNOSIS — I2510 Atherosclerotic heart disease of native coronary artery without angina pectoris: Secondary | ICD-10-CM | POA: Diagnosis not present

## 2020-06-20 DIAGNOSIS — R55 Syncope and collapse: Secondary | ICD-10-CM | POA: Diagnosis not present

## 2020-06-20 DIAGNOSIS — F0150 Vascular dementia without behavioral disturbance: Secondary | ICD-10-CM | POA: Diagnosis not present

## 2020-07-01 DIAGNOSIS — H6123 Impacted cerumen, bilateral: Secondary | ICD-10-CM | POA: Diagnosis not present

## 2020-07-14 DIAGNOSIS — S0091XA Abrasion of unspecified part of head, initial encounter: Secondary | ICD-10-CM | POA: Diagnosis not present

## 2020-07-14 DIAGNOSIS — F0150 Vascular dementia without behavioral disturbance: Secondary | ICD-10-CM | POA: Diagnosis not present

## 2020-07-14 DIAGNOSIS — W19XXXA Unspecified fall, initial encounter: Secondary | ICD-10-CM | POA: Diagnosis not present

## 2020-07-17 DIAGNOSIS — S31000A Unspecified open wound of lower back and pelvis without penetration into retroperitoneum, initial encounter: Secondary | ICD-10-CM | POA: Diagnosis not present

## 2020-07-17 DIAGNOSIS — F0150 Vascular dementia without behavioral disturbance: Secondary | ICD-10-CM | POA: Diagnosis not present

## 2020-08-01 DIAGNOSIS — Z20822 Contact with and (suspected) exposure to covid-19: Secondary | ICD-10-CM | POA: Diagnosis not present

## 2020-08-19 ENCOUNTER — Ambulatory Visit: Payer: Medicare Other

## 2020-09-08 DIAGNOSIS — I10 Essential (primary) hypertension: Secondary | ICD-10-CM | POA: Diagnosis not present

## 2020-09-08 DIAGNOSIS — D649 Anemia, unspecified: Secondary | ICD-10-CM | POA: Diagnosis not present

## 2020-09-08 DIAGNOSIS — N390 Urinary tract infection, site not specified: Secondary | ICD-10-CM | POA: Diagnosis not present

## 2020-09-09 DIAGNOSIS — M545 Low back pain, unspecified: Secondary | ICD-10-CM | POA: Diagnosis not present

## 2020-09-09 DIAGNOSIS — N401 Enlarged prostate with lower urinary tract symptoms: Secondary | ICD-10-CM | POA: Diagnosis not present

## 2020-09-09 DIAGNOSIS — F0150 Vascular dementia without behavioral disturbance: Secondary | ICD-10-CM | POA: Diagnosis not present

## 2020-09-09 DIAGNOSIS — L308 Other specified dermatitis: Secondary | ICD-10-CM | POA: Diagnosis not present

## 2020-09-09 DIAGNOSIS — K219 Gastro-esophageal reflux disease without esophagitis: Secondary | ICD-10-CM | POA: Diagnosis not present

## 2020-09-10 DIAGNOSIS — N390 Urinary tract infection, site not specified: Secondary | ICD-10-CM | POA: Diagnosis not present

## 2020-10-09 DIAGNOSIS — R1310 Dysphagia, unspecified: Secondary | ICD-10-CM | POA: Diagnosis not present

## 2020-10-09 DIAGNOSIS — F0150 Vascular dementia without behavioral disturbance: Secondary | ICD-10-CM | POA: Diagnosis not present

## 2020-10-11 ENCOUNTER — Emergency Department (HOSPITAL_COMMUNITY): Payer: Medicare Other

## 2020-10-11 ENCOUNTER — Emergency Department (HOSPITAL_COMMUNITY)
Admission: EM | Admit: 2020-10-11 | Discharge: 2020-10-11 | Disposition: A | Discharge status: Home / Self Care | Payer: Medicare Other | Attending: Emergency Medicine | Admitting: Emergency Medicine

## 2020-10-11 ENCOUNTER — Encounter (HOSPITAL_COMMUNITY): Payer: Self-pay

## 2020-10-11 DIAGNOSIS — Z85828 Personal history of other malignant neoplasm of skin: Secondary | ICD-10-CM | POA: Diagnosis not present

## 2020-10-11 DIAGNOSIS — I10 Essential (primary) hypertension: Secondary | ICD-10-CM | POA: Diagnosis not present

## 2020-10-11 DIAGNOSIS — I110 Hypertensive heart disease with heart failure: Secondary | ICD-10-CM | POA: Insufficient documentation

## 2020-10-11 DIAGNOSIS — I2510 Atherosclerotic heart disease of native coronary artery without angina pectoris: Secondary | ICD-10-CM | POA: Insufficient documentation

## 2020-10-11 DIAGNOSIS — I5042 Chronic combined systolic (congestive) and diastolic (congestive) heart failure: Secondary | ICD-10-CM | POA: Insufficient documentation

## 2020-10-11 DIAGNOSIS — Z955 Presence of coronary angioplasty implant and graft: Secondary | ICD-10-CM | POA: Insufficient documentation

## 2020-10-11 DIAGNOSIS — I517 Cardiomegaly: Secondary | ICD-10-CM | POA: Diagnosis not present

## 2020-10-11 DIAGNOSIS — F0390 Unspecified dementia without behavioral disturbance: Secondary | ICD-10-CM | POA: Diagnosis not present

## 2020-10-11 DIAGNOSIS — Z96641 Presence of right artificial hip joint: Secondary | ICD-10-CM | POA: Insufficient documentation

## 2020-10-11 DIAGNOSIS — R079 Chest pain, unspecified: Secondary | ICD-10-CM | POA: Diagnosis not present

## 2020-10-11 DIAGNOSIS — Z7401 Bed confinement status: Secondary | ICD-10-CM | POA: Diagnosis not present

## 2020-10-11 DIAGNOSIS — R0789 Other chest pain: Secondary | ICD-10-CM | POA: Diagnosis not present

## 2020-10-11 DIAGNOSIS — Z7982 Long term (current) use of aspirin: Secondary | ICD-10-CM | POA: Insufficient documentation

## 2020-10-11 DIAGNOSIS — R0902 Hypoxemia: Secondary | ICD-10-CM | POA: Diagnosis not present

## 2020-10-11 DIAGNOSIS — R4182 Altered mental status, unspecified: Secondary | ICD-10-CM | POA: Diagnosis not present

## 2020-10-11 LAB — BASIC METABOLIC PANEL
Anion gap: 8 (ref 5–15)
BUN: 24 mg/dL — ABNORMAL HIGH (ref 8–23)
CO2: 23 mmol/L (ref 22–32)
Calcium: 8.7 mg/dL — ABNORMAL LOW (ref 8.9–10.3)
Chloride: 104 mmol/L (ref 98–111)
Creatinine, Ser: 1.03 mg/dL (ref 0.61–1.24)
GFR, Estimated: >60 mL/min (ref >60)
Glucose, Bld: 106 mg/dL — ABNORMAL HIGH (ref 70–99)
Potassium: 4.9 mmol/L (ref 3.5–5.1)
Sodium: 135 mmol/L (ref 135–145)

## 2020-10-11 LAB — CBC WITH DIFFERENTIAL/PLATELET
Abs Immature Granulocytes: 0.04 10*3/uL (ref 0.00–0.07)
Basophils Absolute: 0 10*3/uL (ref 0.0–0.1)
Basophils Relative: 1 %
Eosinophils Absolute: 0.4 10*3/uL (ref 0.0–0.5)
Eosinophils Relative: 5 %
HCT: 38.3 % — ABNORMAL LOW (ref 39.0–52.0)
Hemoglobin: 13 g/dL (ref 13.0–17.0)
Immature Granulocytes: 1 %
Lymphocytes Relative: 23 %
Lymphs Abs: 1.6 10*3/uL (ref 0.7–4.0)
MCH: 32.5 pg (ref 26.0–34.0)
MCHC: 33.9 g/dL (ref 30.0–36.0)
MCV: 95.8 fL (ref 80.0–100.0)
Monocytes Absolute: 1 10*3/uL (ref 0.1–1.0)
Monocytes Relative: 14 %
Neutro Abs: 4.1 10*3/uL (ref 1.7–7.7)
Neutrophils Relative %: 56 %
Platelets: 243 10*3/uL (ref 150–400)
RBC: 4 MIL/uL — ABNORMAL LOW (ref 4.22–5.81)
RDW: 14.6 % (ref 11.5–15.5)
WBC: 7.1 10*3/uL (ref 4.0–10.5)
nRBC: 0 % (ref 0.0–0.2)

## 2020-10-11 LAB — TROPONIN I (HIGH SENSITIVITY)
Troponin I (High Sensitivity): 23 ng/L — ABNORMAL HIGH (ref <18)
Troponin I (High Sensitivity): 20 ng/L — ABNORMAL HIGH (ref <18)

## 2020-10-11 NOTE — ED Triage Notes (Signed)
Pt from [facility] for eval of CP. Pt nonverbal on arrival, motions yes/no to questions. Per facility, pt "hardly calls out, so when he clutched his chest, it was serious." 3 NTG given at facility PTA, pt denies [facility], N/V, any other pain.  170/78 HR 60, hx afib

## 2020-10-11 NOTE — ED Notes (Signed)
Wife updated

## 2020-10-11 NOTE — ED Provider Notes (Signed)
[facility] EMERGENCY DEPARTMENT Provider Note   CSN: [id number] Arrival date & time: 10/11/20  0251     History Chief Complaint  Patient presents with   Chest Pain    [name] is a 87 y.o. male.  Patient brought to the emergency department for evaluation of possible chest pain.  Patient comes from a nursing facility.  He reportedly called out the staff tonight because he was experiencing pain in his chest.  Staff reports that he does not complain much, thought that he should come to the ER for evaluation.  He denies shortness of breath.      Past Medical History:  Diagnosis Date   CAD (coronary artery disease)    s/p BMS to LCx in 2002, DES to mid-LAD in 2007   Dizziness and giddiness    Hypertrophy of prostate without urinary obstruction and other lower urinary tract symptoms (LUTS)    Ischemic cardiomyopathy    MI (myocardial infarction) ([place]) 2002   Mild aortic insufficiency    Mild mitral regurgitation    Osteoarthrosis, unspecified whether generalized or localized, unspecified site    Other and unspecified hyperlipidemia    Sinus bradycardia    Skin cancer    "top of his head"   Thrombocytopenia, unspecified ([place])    Unspecified essential hypertension     Patient Active Problem List   Diagnosis Date Noted   Confused 02/25/2020   Rash 11/18/2019   Dementia ([place]) 02/17/2019   Abdominal pain, acute, epigastric 08/02/2018   Hypertensive urgency 08/02/2018   Hyperbilirubinemia 08/02/2018   Hypertension    Abdominal pain 07/13/2018   Bradycardia 07/13/2018   Orthostatic dizziness 12/15/2017   Wrist pain, acute, right 10/14/2017   Seborrheic keratoses 06/01/2017   Chest pain 04/22/2017   Fall 04/22/2017   Gait disorder 11/03/2016   Nausea 10/12/2016   Hearing loss 11/25/2015   Wart viral 04/28/2015   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   Situational depression 08/07/2013   Well adult exam 08/02/2012   Nausea with  vomiting 03/28/2012   HYPERGLYCEMIA 02/23/2010   HYPERKALEMIA 10/21/2009   Actinic keratosis 10/21/2009   ERECTILE DYSFUNCTION, 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   PSA, INCREASED 04/22/2009   MELANOMA, SCALP 01/02/2009   NEOPLASM, SKIN, UNCERTAIN BEHAVIOR [id number]   CAROTID ARTERY STENOSIS, WITHOUT INFARCTION 05/06/2008   Congestive heart failure ([place]) 04/29/2008   Chronic combined systolic and diastolic CHF (congestive heart failure) ([place]) 04/29/2008   Osteoarthritis 04/29/2008   VERTIGO 03/11/2008   Dyslipidemia 04/14/2007   Essential hypertension 04/14/2007   Coronary atherosclerosis 04/14/2007   BPH (benign prostatic hyperplasia) 04/14/2007    Past Surgical History:  Procedure Laterality Date   COLONOSCOPY     CORONARY ANGIOPLASTY  01/07/2001, 2008   2 stents   ESOPHAGOGASTRODUODENOSCOPY N/A 04/22/2014   Procedure: ESOPHAGOGASTRODUODENOSCOPY (EGD);  Surgeon: Milus Banister, MD;  Location: [place];  Service: Endoscopy;  Laterality: N/A;   HIP ARTHROPLASTY Right 2006   JOINT REPLACEMENT     MOHS SURGERY     "top of his head"   TRANSURETHRAL RESECTION OF PROSTATE         Family History  Problem Relation Age of Onset   Heart disease Father    Coronary artery disease Other    Colon cancer Neg Hx     Social History   Tobacco Use   Smoking status: Never   Smokeless tobacco: Never  Vaping Use   Vaping Use: Never used  Substance Use Topics   Alcohol use: No  Alcohol/week: 0.0 standard drinks   Drug use: No    Home Medications Prior to Admission medications   Medication Sig Start Date End Date Taking? Authorizing Provider  acetaminophen (TYLENOL 8 HOUR) 650 MG CR tablet Take 1 tablet (650 mg total) by mouth in the morning and at bedtime. 02/25/20   Plotnikov, Evie Lacks, MD  aspirin 81 MG EC tablet Take 81 mg by mouth daily.      [provider]  b complex vitamins tablet Take 1 tablet by mouth daily.      [provider]  bisacodyl (DULCOLAX) 10 MG  suppository Place 1 suppository (10 mg total) rectally as needed for moderate constipation. 06/13/19   Maudie Flakes, MD  Cholecalciferol 1000 UNITS tablet Take 1,000 Units by mouth daily.      [provider]  clotrimazole-betamethasone (LOTRISONE) cream Apply 1 application topically 2 (two) times daily. 02/25/20 02/24/21  Plotnikov, Evie Lacks, MD  finasteride (PROSCAR) 5 MG tablet Take 1 tablet (5 mg total) by mouth daily. 02/27/19   Plotnikov, Evie Lacks, MD  Glucosamine-Chondroitin 500-400 MG CAPS Take 1 capsule by mouth 2 (two) times daily.    [provider]  isosorbide mononitrate (IMDUR) 30 MG 24 hr tablet Take 1 tablet (30 mg total) by mouth daily. 02/27/19   Plotnikov, Evie Lacks, MD  lidocaine (LIDODERM) 5 % Place 1 patch onto the skin daily. Remove & Discard patch within 12 hours or as directed by MD Patient not taking: Reported on 12/27/2019 12/20/19   Palumbo, April, MD  nitroGLYCERIN (NITROSTAT) 0.4 MG SL tablet Place 1 tablet (0.4 mg total) under the tongue every 5 (five) minutes as needed for chest pain (Please call your doctor/911 if no improvement after the second dose). 02/19/19   Plotnikov, Evie Lacks, MD  pantoprazole (PROTONIX) 40 MG tablet Take 1 tablet (40 mg total) by mouth daily. Patient taking differently: Take 40 mg by mouth daily at 6 (six) AM.  02/27/19   Plotnikov, Evie Lacks, MD  polyethylene glycol (MIRALAX) 17 g packet Take 17 g by mouth daily. 06/13/19   Maudie Flakes, MD  simvastatin (ZOCOR) 40 MG tablet Take 1 tablet (40 mg total) by mouth at bedtime. 10/29/19 01/27/20  Plotnikov, Evie Lacks, MD  tamsulosin (FLOMAX) 0.4 MG CAPS capsule Take 1 capsule (0.4 mg total) by mouth daily after breakfast. 02/27/19   Plotnikov, Evie Lacks, MD  vitamin C (ASCORBIC ACID) 500 MG tablet Take 500 mg by mouth daily.    [provider]    Allergies    Oxycodone and Penicillins  Review of Systems   Review of Systems  Cardiovascular:  Positive for chest pain.   All other systems reviewed and are negative.  Physical Exam Updated Vital Signs BP 128/70   Pulse (!) 53   Temp 97.7 F (36.5 C) (Oral)   Resp 11   SpO2 97%   Physical Exam Vitals and nursing note reviewed.  Constitutional:      General: He is not in acute distress.    Appearance: Normal appearance. He is well-developed.  HENT:     Head: Normocephalic and atraumatic.     Right Ear: Hearing normal.     Left Ear: Hearing normal.     Nose: Nose normal.  Eyes:     Conjunctiva/sclera: Conjunctivae normal.     Pupils: Pupils are equal, round, and reactive to light.  Cardiovascular:     Rate and Rhythm: Regular rhythm.     Heart sounds:  S1 normal and S2 normal. No murmur heard.   No friction rub. No gallop.  Pulmonary:     Effort: Pulmonary effort is normal. No respiratory distress.     Breath sounds: Normal breath sounds.  Chest:     Chest wall: No tenderness.  Abdominal:     General: Bowel sounds are normal.     Palpations: Abdomen is soft.     Tenderness: There is no abdominal tenderness. There is no guarding or rebound. Negative signs include Murphy's sign and McBurney's sign.     Hernia: No hernia is present.  Musculoskeletal:        General: Normal range of motion.     Cervical back: Normal range of motion and neck supple.  Skin:    General: Skin is warm and dry.  Neurological:     Mental Status: He is alert.     Cranial Nerves: No cranial nerve deficit.     Sensory: No sensory deficit.     Coordination: Coordination normal.  Psychiatric:        Speech: Speech normal.        Behavior: Behavior normal.        Thought Content: Thought content normal.    ED Results / Procedures / Treatments   Labs (all labs ordered are listed, but only abnormal results are displayed) Labs Reviewed  CBC WITH DIFFERENTIAL/PLATELET - Abnormal; Notable for the following components:      Result Value   RBC 4.00 (*)    HCT 38.3 (*)    All other components within normal limits   BASIC METABOLIC PANEL - Abnormal; Notable for the following components:   Glucose, Bld 106 (*)    BUN 24 (*)    Calcium 8.7 (*)    All other components within normal limits  TROPONIN I (HIGH SENSITIVITY) - Abnormal; Notable for the following components:   Troponin I (High Sensitivity) 23 (*)    All other components within normal limits  TROPONIN I (HIGH SENSITIVITY) - Abnormal; Notable for the following components:   Troponin I (High Sensitivity) 20 (*)    All other components within normal limits    EKG EKG Interpretation  Date/Time:  Saturday October 11 2020 03:08:09 EDT Ventricular Rate:  57 PR Interval:  162 QRS Duration: 107 QT Interval:  441 QTC Calculation: 430 R Axis:   -54 Text Interpretation: Sinus rhythm LAD, consider left anterior fascicular block Consider RVH or posterior infarct Confirmed by Orpah Greek [phone]) on 10/11/2020 3:11:53 AM  Radiology DG Chest Port 1 View  Result Date: 10/11/2020 CLINICAL DATA:  87 year old male with chest pain. EXAM: PORTABLE CHEST 1 VIEW COMPARISON:  Portable chest 01/13/2020 and earlier. FINDINGS: Portable AP semi upright view at 0340 hours. Lower lung volumes. Stable mild cardiomegaly and tortuosity of the thoracic aorta when allowing for mild patient rotation to the right today. Calcified aortic atherosclerosis. Visualized tracheal air column is within normal limits. No pneumothorax, pulmonary edema, pleural effusion or confluent pulmonary opacity. Negative visible bowel gas. No acute osseous abnormality identified. IMPRESSION: Lower lung volumes.  No acute cardiopulmonary abnormality. Aortic Atherosclerosis (ICD10-I70.0). Electronically Signed   By: Genevie Ann M.D.   On: 10/11/2020 03:58    Procedures Procedures   Medications Ordered in ED Medications - No data to display  ED Course  I have reviewed the triage vital signs and the nursing notes.  Pertinent labs & imaging results that were available during my care of the  patient were reviewed by  me and considered in my medical decision making (see chart for details).    MDM Rules/Calculators/A&P                           Patient with history of coronary artery disease presents to the emergency department with complaints of chest pain.  Patient developed chest pain prior to coming to the ER.  Patient's EKG does not suggest ischemia or infarct.  Troponins are mildly elevated but at his baseline, plateaued in the 20s.  Chest x-ray without abnormality.  Upon recheck this morning, patient is sleeping.  He awakens easily and reports that he is not experiencing any chest pain.  He feels well.  Work-up has been largely reassuring, will return to facility.  Final Clinical Impression(s) / ED Diagnoses Final diagnoses:  Chest pain, unspecified type    Rx / DC Orders ED Discharge Orders     None        Orpah Greek, MD 10/11/20 [phone]

## 2020-10-16 DIAGNOSIS — Z23 Encounter for immunization: Secondary | ICD-10-CM | POA: Diagnosis not present

## 2020-11-19 DIAGNOSIS — W19XXXA Unspecified fall, initial encounter: Secondary | ICD-10-CM | POA: Diagnosis not present

## 2020-11-19 DIAGNOSIS — I10 Essential (primary) hypertension: Secondary | ICD-10-CM | POA: Diagnosis not present

## 2020-11-19 DIAGNOSIS — Z96641 Presence of right artificial hip joint: Secondary | ICD-10-CM | POA: Diagnosis not present

## 2020-11-19 DIAGNOSIS — R52 Pain, unspecified: Secondary | ICD-10-CM | POA: Diagnosis not present

## 2020-11-19 DIAGNOSIS — M19011 Primary osteoarthritis, right shoulder: Secondary | ICD-10-CM | POA: Diagnosis not present

## 2020-11-19 DIAGNOSIS — M19021 Primary osteoarthritis, right elbow: Secondary | ICD-10-CM | POA: Diagnosis not present

## 2020-11-19 DIAGNOSIS — N390 Urinary tract infection, site not specified: Secondary | ICD-10-CM | POA: Diagnosis not present

## 2020-11-19 DIAGNOSIS — I2510 Atherosclerotic heart disease of native coronary artery without angina pectoris: Secondary | ICD-10-CM | POA: Diagnosis not present

## 2020-11-19 DIAGNOSIS — D649 Anemia, unspecified: Secondary | ICD-10-CM | POA: Diagnosis not present

## 2020-11-19 DIAGNOSIS — F0390 Unspecified dementia without behavioral disturbance: Secondary | ICD-10-CM | POA: Diagnosis not present

## 2020-11-19 DIAGNOSIS — R2689 Other abnormalities of gait and mobility: Secondary | ICD-10-CM | POA: Diagnosis not present

## 2020-11-19 DIAGNOSIS — M6281 Muscle weakness (generalized): Secondary | ICD-10-CM | POA: Diagnosis not present

## 2020-11-19 DIAGNOSIS — M25551 Pain in right hip: Secondary | ICD-10-CM | POA: Diagnosis not present

## 2020-11-20 DIAGNOSIS — R339 Retention of urine, unspecified: Secondary | ICD-10-CM | POA: Diagnosis not present

## 2020-11-20 DIAGNOSIS — R350 Frequency of micturition: Secondary | ICD-10-CM | POA: Diagnosis not present

## 2020-11-21 DIAGNOSIS — R8290 Unspecified abnormal findings in urine: Secondary | ICD-10-CM | POA: Diagnosis not present

## 2020-11-21 DIAGNOSIS — W19XXXD Unspecified fall, subsequent encounter: Secondary | ICD-10-CM | POA: Diagnosis not present

## 2020-11-21 DIAGNOSIS — F0150 Vascular dementia without behavioral disturbance: Secondary | ICD-10-CM | POA: Diagnosis not present

## 2020-12-23 DIAGNOSIS — F0150 Vascular dementia without behavioral disturbance: Secondary | ICD-10-CM | POA: Diagnosis not present

## 2020-12-23 DIAGNOSIS — W19XXXA Unspecified fall, initial encounter: Secondary | ICD-10-CM | POA: Diagnosis not present

## 2020-12-23 DIAGNOSIS — R296 Repeated falls: Secondary | ICD-10-CM | POA: Diagnosis not present

## 2020-12-24 ENCOUNTER — Emergency Department (HOSPITAL_COMMUNITY): Payer: Medicare Other

## 2020-12-24 ENCOUNTER — Encounter (HOSPITAL_COMMUNITY): Payer: Self-pay | Admitting: Emergency Medicine

## 2020-12-24 ENCOUNTER — Emergency Department (HOSPITAL_COMMUNITY)
Admission: EM | Admit: 2020-12-24 | Discharge: 2020-12-24 | Disposition: A | Discharge status: Home / Self Care | Payer: Medicare Other | Attending: Emergency Medicine | Admitting: Emergency Medicine

## 2020-12-24 DIAGNOSIS — Z85828 Personal history of other malignant neoplasm of skin: Secondary | ICD-10-CM | POA: Insufficient documentation

## 2020-12-24 DIAGNOSIS — Z7982 Long term (current) use of aspirin: Secondary | ICD-10-CM | POA: Insufficient documentation

## 2020-12-24 DIAGNOSIS — M47812 Spondylosis without myelopathy or radiculopathy, cervical region: Secondary | ICD-10-CM | POA: Insufficient documentation

## 2020-12-24 DIAGNOSIS — Z7401 Bed confinement status: Secondary | ICD-10-CM | POA: Diagnosis not present

## 2020-12-24 DIAGNOSIS — R109 Unspecified abdominal pain: Secondary | ICD-10-CM | POA: Diagnosis not present

## 2020-12-24 DIAGNOSIS — R296 Repeated falls: Secondary | ICD-10-CM | POA: Diagnosis not present

## 2020-12-24 DIAGNOSIS — R5383 Other fatigue: Secondary | ICD-10-CM | POA: Insufficient documentation

## 2020-12-24 DIAGNOSIS — R4182 Altered mental status, unspecified: Secondary | ICD-10-CM | POA: Diagnosis not present

## 2020-12-24 DIAGNOSIS — R29810 Facial weakness: Secondary | ICD-10-CM | POA: Diagnosis not present

## 2020-12-24 DIAGNOSIS — W19XXXA Unspecified fall, initial encounter: Secondary | ICD-10-CM | POA: Insufficient documentation

## 2020-12-24 DIAGNOSIS — Y92128 Other place in nursing home as the place of occurrence of the external cause: Secondary | ICD-10-CM | POA: Diagnosis not present

## 2020-12-24 DIAGNOSIS — Z79899 Other long term (current) drug therapy: Secondary | ICD-10-CM | POA: Diagnosis not present

## 2020-12-24 DIAGNOSIS — Z96641 Presence of right artificial hip joint: Secondary | ICD-10-CM | POA: Diagnosis not present

## 2020-12-24 DIAGNOSIS — I10 Essential (primary) hypertension: Secondary | ICD-10-CM | POA: Diagnosis not present

## 2020-12-24 DIAGNOSIS — I110 Hypertensive heart disease with heart failure: Secondary | ICD-10-CM | POA: Diagnosis not present

## 2020-12-24 DIAGNOSIS — R1011 Right upper quadrant pain: Secondary | ICD-10-CM | POA: Diagnosis not present

## 2020-12-24 DIAGNOSIS — I5042 Chronic combined systolic (congestive) and diastolic (congestive) heart failure: Secondary | ICD-10-CM | POA: Diagnosis not present

## 2020-12-24 DIAGNOSIS — R531 Weakness: Secondary | ICD-10-CM | POA: Diagnosis not present

## 2020-12-24 DIAGNOSIS — F0390 Unspecified dementia without behavioral disturbance: Secondary | ICD-10-CM | POA: Insufficient documentation

## 2020-12-24 DIAGNOSIS — R404 Transient alteration of awareness: Secondary | ICD-10-CM | POA: Diagnosis not present

## 2020-12-24 DIAGNOSIS — G4489 Other headache syndrome: Secondary | ICD-10-CM | POA: Diagnosis not present

## 2020-12-24 DIAGNOSIS — I2510 Atherosclerotic heart disease of native coronary artery without angina pectoris: Secondary | ICD-10-CM | POA: Insufficient documentation

## 2020-12-24 DIAGNOSIS — R519 Headache, unspecified: Secondary | ICD-10-CM | POA: Diagnosis not present

## 2020-12-24 DIAGNOSIS — R0902 Hypoxemia: Secondary | ICD-10-CM | POA: Diagnosis not present

## 2020-12-24 DIAGNOSIS — F0150 Vascular dementia without behavioral disturbance: Secondary | ICD-10-CM | POA: Diagnosis not present

## 2020-12-24 LAB — COMPREHENSIVE METABOLIC PANEL
ALT: 25 U/L (ref 0–44)
AST: 32 U/L (ref 15–41)
Albumin: 3.4 g/dL — ABNORMAL LOW (ref 3.5–5.0)
Alkaline Phosphatase: 52 U/L (ref 38–126)
Anion gap: 8 (ref 5–15)
BUN: 25 mg/dL — ABNORMAL HIGH (ref 8–23)
CO2: 26 mmol/L (ref 22–32)
Calcium: 8.9 mg/dL (ref 8.9–10.3)
Chloride: 100 mmol/L (ref 98–111)
Creatinine, Ser: 1.19 mg/dL (ref 0.61–1.24)
GFR, Estimated: 56 mL/min — ABNORMAL LOW (ref >60)
Glucose, Bld: 128 mg/dL — ABNORMAL HIGH (ref 70–99)
Potassium: 5 mmol/L (ref 3.5–5.1)
Sodium: 134 mmol/L — ABNORMAL LOW (ref 135–145)
Total Bilirubin: 1.1 mg/dL (ref 0.3–1.2)
Total Protein: 7.2 g/dL (ref 6.5–8.1)

## 2020-12-24 LAB — URINALYSIS, ROUTINE W REFLEX MICROSCOPIC
Bilirubin Urine: NEGATIVE
Glucose, UA: NEGATIVE mg/dL
Hgb urine dipstick: NEGATIVE
Ketones, ur: 5 mg/dL — AB
Leukocytes,Ua: NEGATIVE
Nitrite: NEGATIVE
Protein, ur: NEGATIVE mg/dL
Specific Gravity, Urine: 1.015 (ref 1.005–1.030)
pH: 6 (ref 5.0–8.0)

## 2020-12-24 LAB — CBC
HCT: 42.7 % (ref 39.0–52.0)
Hemoglobin: 14 g/dL (ref 13.0–17.0)
MCH: 31.4 pg (ref 26.0–34.0)
MCHC: 32.8 g/dL (ref 30.0–36.0)
MCV: 95.7 fL (ref 80.0–100.0)
Platelets: 249 10*3/uL (ref 150–400)
RBC: 4.46 MIL/uL (ref 4.22–5.81)
RDW: 14.1 % (ref 11.5–15.5)
WBC: 10.7 10*3/uL — ABNORMAL HIGH (ref 4.0–10.5)
nRBC: 0 % (ref 0.0–0.2)

## 2020-12-24 LAB — LIPASE, BLOOD: Lipase: 28 U/L (ref 11–51)

## 2020-12-24 NOTE — ED Triage Notes (Addendum)
Pt BIB EMS from [place]. He has reportedly has 3 falls in the past 3 days. Last fall was this AM, found on floor in room. Per EMS no injury, c/o of head pain and increasing lethargy. Decreased appetite. VSS

## 2020-12-24 NOTE — ED Provider Notes (Signed)
[facility] DEPT Provider Note   CSN: [id number] Arrival date & time: 12/24/20  1446     History Chief Complaint  Patient presents with   Fall   Headache   Fatigue    [name] is a 87 y.o. male with known history of dementia who [facility] numerous [facility] within the last 3 days.  Most recent fall was this morning.  Staff at [facility] found patient on the floor.  Per EMS there was no injury but patient is complaining of head pain and increased lethargy.  His appetite is also decreased.  During interview, patient is oriented to self but not place or year.  He is not complaining of head or neck pain, and does not know why he is here.  He does complain of some right upper quadrant pain during exam.  Rest of the interview difficult due to patient's mental status.   Fall Associated symptoms include abdominal pain and headaches.  Headache Associated symptoms: abdominal pain       Past Medical History:  Diagnosis Date   CAD (coronary artery disease)    s/p BMS to LCx in 2002, DES to mid-LAD in 2007   Dizziness and giddiness    Hypertrophy of prostate without urinary obstruction and other lower urinary tract symptoms (LUTS)    Ischemic cardiomyopathy    MI (myocardial infarction) ([place]) 2002   Mild aortic insufficiency    Mild mitral regurgitation    Osteoarthrosis, unspecified whether generalized or localized, unspecified site    Other and unspecified hyperlipidemia    Sinus bradycardia    Skin cancer    "top of his head"   Thrombocytopenia, unspecified ([place])    Unspecified essential hypertension     Patient Active Problem List   Diagnosis Date Noted   Confused 02/25/2020   Rash 11/18/2019   Dementia ([place]) 02/17/2019   Abdominal pain, acute, epigastric 08/02/2018   Hypertensive urgency 08/02/2018   Hyperbilirubinemia 08/02/2018   Hypertension    Abdominal pain 07/13/2018   Bradycardia 07/13/2018    Orthostatic dizziness 12/15/2017   Wrist pain, acute, right 10/14/2017   Seborrheic keratoses 06/01/2017   Chest pain 04/22/2017   Fall 04/22/2017   Gait disorder 11/03/2016   Nausea 10/12/2016   Hearing loss 11/25/2015   Wart viral 04/28/2015   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   Situational depression 08/07/2013   Well adult exam 08/02/2012   Nausea with vomiting 03/28/2012   HYPERGLYCEMIA 02/23/2010   HYPERKALEMIA 10/21/2009   Actinic keratosis 10/21/2009   ERECTILE DYSFUNCTION, 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   PSA, INCREASED 04/22/2009   MELANOMA, SCALP 01/02/2009   NEOPLASM, SKIN, UNCERTAIN BEHAVIOR 01/75/1025   CAROTID ARTERY STENOSIS, WITHOUT INFARCTION 05/06/2008   Congestive heart failure ([place]) 04/29/2008   Chronic combined systolic and diastolic CHF (congestive heart failure) ([place]) 04/29/2008   Osteoarthritis 04/29/2008   VERTIGO 03/11/2008   Dyslipidemia 04/14/2007   Essential hypertension 04/14/2007   Coronary atherosclerosis 04/14/2007   BPH (benign prostatic hyperplasia) 04/14/2007    Past Surgical History:  Procedure Laterality Date   COLONOSCOPY     CORONARY ANGIOPLASTY  01/07/2001, 2008   2 stents   ESOPHAGOGASTRODUODENOSCOPY N/A 04/22/2014   Procedure: ESOPHAGOGASTRODUODENOSCOPY (EGD);  Surgeon: Milus Banister, MD;  Location: [facility];  Service: Endoscopy;  Laterality: N/A;   HIP ARTHROPLASTY Right 2006   JOINT REPLACEMENT     MOHS SURGERY     "top of his head"   TRANSURETHRAL RESECTION  OF PROSTATE         Family History  Problem Relation Age of Onset   Heart disease Father    Coronary artery disease Other    Colon cancer Neg Hx     Social History   Tobacco Use   Smoking status: Never   Smokeless tobacco: Never  Vaping Use   Vaping Use: Never used  Substance Use Topics   Alcohol use: No    Alcohol/week: 0.0 standard drinks   Drug use: No    Home Medications Prior to Admission medications   Medication Sig Start Date End Date  Taking? Authorizing Provider  acetaminophen (TYLENOL 8 HOUR) 650 MG CR tablet Take 1 tablet (650 mg total) by mouth in the morning and at bedtime. 02/25/20   Plotnikov, Evie Lacks, MD  aspirin 81 MG EC tablet Take 81 mg by mouth daily.      [provider]  b complex vitamins tablet Take 1 tablet by mouth daily.      [provider]  bisacodyl (DULCOLAX) 10 MG suppository Place 1 suppository (10 mg total) rectally as needed for moderate constipation. 06/13/19   Maudie Flakes, MD  Cholecalciferol 1000 UNITS tablet Take 1,000 Units by mouth daily.      [provider]  clotrimazole-betamethasone (LOTRISONE) cream Apply 1 application topically 2 (two) times daily. 02/25/20 02/24/21  Plotnikov, Evie Lacks, MD  finasteride (PROSCAR) 5 MG tablet Take 1 tablet (5 mg total) by mouth daily. 02/27/19   Plotnikov, Evie Lacks, MD  Glucosamine-Chondroitin 500-400 MG CAPS Take 1 capsule by mouth 2 (two) times daily.    [provider]  isosorbide mononitrate (IMDUR) 30 MG 24 hr tablet Take 1 tablet (30 mg total) by mouth daily. 02/27/19   Plotnikov, Evie Lacks, MD  lidocaine (LIDODERM) 5 % Place 1 patch onto the skin daily. Remove & Discard patch within 12 hours or as directed by MD Patient not taking: Reported on 12/27/2019 12/20/19   Palumbo, April, MD  nitroGLYCERIN (NITROSTAT) 0.4 MG SL tablet Place 1 tablet (0.4 mg total) under the tongue every 5 (five) minutes as needed for chest pain (Please call your doctor/911 if no improvement after the second dose). 02/19/19   Plotnikov, Evie Lacks, MD  pantoprazole (PROTONIX) 40 MG tablet Take 1 tablet (40 mg total) by mouth daily. Patient taking differently: Take 40 mg by mouth daily at 6 (six) AM.  02/27/19   Plotnikov, Evie Lacks, MD  polyethylene glycol (MIRALAX) 17 g packet Take 17 g by mouth daily. 06/13/19   Maudie Flakes, MD  simvastatin (ZOCOR) 40 MG tablet Take 1 tablet (40 mg total) by mouth at bedtime. 10/29/19 01/27/20  Plotnikov,  Evie Lacks, MD  tamsulosin (FLOMAX) 0.4 MG CAPS capsule Take 1 capsule (0.4 mg total) by mouth daily after breakfast. 02/27/19   Plotnikov, Evie Lacks, MD  vitamin C (ASCORBIC ACID) 500 MG tablet Take 500 mg by mouth daily.    [provider]    Allergies    Oxycodone and Penicillins  Review of Systems   Review of Systems  Unable to perform ROS: Dementia  Gastrointestinal:  Positive for abdominal pain.  Neurological:  Positive for headaches.  All other systems reviewed and are negative.  Physical Exam Updated Vital Signs BP (!) 136/59   Pulse 61   Temp 98.9 F (37.2 C) (Oral)   Resp 12   SpO2 94%   Physical Exam Vitals and nursing note reviewed.  Constitutional:  General: He is not in acute distress.    Appearance: He is not ill-appearing.  HENT:     Head: Atraumatic.  Eyes:     Extraocular Movements: Extraocular movements intact.     Conjunctiva/sclera: Conjunctivae normal.     Pupils: Pupils are equal, round, and reactive to light.  Cardiovascular:     Rate and Rhythm: Normal rate and regular rhythm.     Pulses: Normal pulses.          Radial pulses are 2+ on the right side and 2+ on the left side.       Dorsalis pedis pulses are 2+ on the right side and 2+ on the left side.     Heart sounds: No murmur heard. Pulmonary:     Effort: Pulmonary effort is normal. No respiratory distress.     Breath sounds: Normal breath sounds.  Abdominal:     General: Abdomen is flat. There is no distension.     Palpations: Abdomen is soft.     Tenderness: There is no abdominal tenderness.     Comments: TTP in the right upper quadrant.  Normoactive bowel sounds.  Abdomen soft nondistended  Musculoskeletal:        General: Normal range of motion.     Cervical back: Normal range of motion.  Skin:    General: Skin is warm and dry.     Capillary Refill: Capillary refill takes less than 2 seconds.     Comments: Numerous support keratoses across the abdomen and arms.   Previously diagnosed.  Neurological:     General: No focal deficit present.     Mental Status: Mental status is at baseline. He is disoriented.     Comments: Cranial nerves III through XII intact.  No unilateral facial droop.  5/5 grip strength symmetric bilaterally.  5/5 hip strength symmetric bilaterally.  No focal deficits, no unilateral weakness.  Patient is able to follow commands  Psychiatric:        Mood and Affect: Mood normal.    ED Results / Procedures / Treatments   Labs (all labs ordered are listed, but only abnormal results are displayed) Labs Reviewed  COMPREHENSIVE METABOLIC PANEL  CBC  URINALYSIS, ROUTINE W REFLEX MICROSCOPIC  LIPASE, BLOOD    EKG None  Radiology CT Head Wo Contrast  Result Date: 12/24/2020 CLINICAL DATA:  Mental status change.  Multiple recent falls. EXAM: CT HEAD WITHOUT CONTRAST CT CERVICAL SPINE WITHOUT CONTRAST TECHNIQUE: Multidetector CT imaging of the head and cervical spine was performed following the standard protocol without intravenous contrast. Multiplanar CT image reconstructions of the cervical spine were also generated. COMPARISON:  None. FINDINGS: CT HEAD FINDINGS Brain: Moderate atrophy. Negative for hydrocephalus. Mild white matter hypodensity bilaterally. Negative for acute infarct, hemorrhage, mass. Vascular: Negative for hyperdense vessel Skull: Negative Sinuses/Orbits: Negative Other: None CT CERVICAL SPINE FINDINGS Alignment: Mild anterolisthesis C7-T1 Skull base and vertebrae: Negative for fracture. Soft tissues and spinal canal: Negative for soft tissue mass or edema. Atherosclerotic calcification in the carotid artery bilaterally. Disc levels: Disc degeneration and spondylosis most prominent C5-6 and C6-7. Foraminal encroachment bilaterally at multiple levels most severe at C5-6. Upper chest: Lung apices clear bilaterally. Other: None IMPRESSION: 1. No acute intracranial abnormality. Atrophy and chronic microvascular ischemic  change. 2. Cervical spondylosis.  Negative for fracture. Electronically Signed   By: Franchot Gallo M.D.   On: 12/24/2020 16:59   CT Cervical Spine Wo Contrast  Result Date: 12/24/2020 CLINICAL DATA:  Mental status  change.  Multiple recent falls. EXAM: CT HEAD WITHOUT CONTRAST CT CERVICAL SPINE WITHOUT CONTRAST TECHNIQUE: Multidetector CT imaging of the head and cervical spine was performed following the standard protocol without intravenous contrast. Multiplanar CT image reconstructions of the cervical spine were also generated. COMPARISON:  None. FINDINGS: CT HEAD FINDINGS Brain: Moderate atrophy. Negative for hydrocephalus. Mild white matter hypodensity bilaterally. Negative for acute infarct, hemorrhage, mass. Vascular: Negative for hyperdense vessel Skull: Negative Sinuses/Orbits: Negative Other: None CT CERVICAL SPINE FINDINGS Alignment: Mild anterolisthesis C7-T1 Skull base and vertebrae: Negative for fracture. Soft tissues and spinal canal: Negative for soft tissue mass or edema. Atherosclerotic calcification in the carotid artery bilaterally. Disc levels: Disc degeneration and spondylosis most prominent C5-6 and C6-7. Foraminal encroachment bilaterally at multiple levels most severe at C5-6. Upper chest: Lung apices clear bilaterally. Other: None IMPRESSION: 1. No acute intracranial abnormality. Atrophy and chronic microvascular ischemic change. 2. Cervical spondylosis.  Negative for fracture. Electronically Signed   By: Franchot Gallo M.D.   On: 12/24/2020 16:59    Procedures Procedures   Medications Ordered in ED Medications - No data to display  ED Course  I have reviewed the triage vital signs and the nursing notes.  Pertinent labs & imaging results that were available during my care of the patient were reviewed by me and considered in my medical decision making (see chart for details).    MDM Rules/Calculators/A&P                         This is a 87 year old male with known  history of dementia who presents from his facility at [facility] for evaluation of falls, change in mental status and increased lethargy over the last 3 days.  Patient at baseline is not oriented to place or year, however he is able to follow commands.  Neuro exam was nonconcerning for unilateral weakness or facial droop.  Pupils symmetric and reactive to light, just not consistent with internal hemorrhage. Vitals are normal and he is afebrile. Does complain of some right upper quadrant tenderness during my exam.  Although patient does not complain of neck or head pain, given the report from [facility] about his frequent falling and increased lethargy, proceed with head CT without contrast for evaluation.  Patient is not on blood thinners.  We will also order full labs along with a lipase for patient's right upper quadrant pain.  Lipase normal.  CMP fairly unremarkable.  CBC unremarkable, no signs of acute hemorrhage.  CT scan of head and cervical spine normal without signs of hemorrhage, infarct or fracture.  I attempted numerous times to call [facility] facility and speak to one of his caretakers myself however phone went to voicemail each time.  Patient's overall reassuring work-up and physical exam, patient is safe to discharge back to [facility] with close PCP follow-up. Final Clinical Impression(s) / ED Diagnoses Final diagnoses:  Fall, initial encounter    Rx / DC Orders ED Discharge Orders     None        Rodena Piety 12/24/20 1952    Lacretia Leigh, MD 12/30/20 1513

## 2020-12-24 NOTE — ED Notes (Signed)
Attempted to call [facility] to notify of patient arrival. No response from facility

## 2020-12-24 NOTE — ED Notes (Signed)
[facility] called for transportation  

## 2020-12-24 NOTE — ED Notes (Signed)
Updated provided via phone call to spouse at this time. All concerns addressed.

## 2020-12-24 NOTE — Discharge Instructions (Addendum)
Patient was seen today for evaluation of fall and increased lethargy with head pain. CT of head and cervical spine show no fractures, hemorrhage, or infarct. Labs and vitals were normal. Please have patient follow up with PCP for further evaluation. If change in mental status worsens, please return to ED.

## 2020-12-24 NOTE — ED Notes (Signed)
C collar removed at this time per PA.

## 2020-12-30 DIAGNOSIS — F0150 Vascular dementia without behavioral disturbance: Secondary | ICD-10-CM | POA: Diagnosis not present

## 2020-12-30 DIAGNOSIS — R269 Unspecified abnormalities of gait and mobility: Secondary | ICD-10-CM | POA: Diagnosis not present

## 2021-01-01 DIAGNOSIS — R2689 Other abnormalities of gait and mobility: Secondary | ICD-10-CM | POA: Diagnosis not present

## 2021-01-01 DIAGNOSIS — Z9181 History of falling: Secondary | ICD-10-CM | POA: Diagnosis not present

## 2021-01-01 DIAGNOSIS — F0390 Unspecified dementia without behavioral disturbance: Secondary | ICD-10-CM | POA: Diagnosis not present

## 2021-01-01 DIAGNOSIS — M6281 Muscle weakness (generalized): Secondary | ICD-10-CM | POA: Diagnosis not present

## 2021-01-01 DIAGNOSIS — R52 Pain, unspecified: Secondary | ICD-10-CM | POA: Diagnosis not present

## 2021-01-01 DIAGNOSIS — F0150 Vascular dementia without behavioral disturbance: Secondary | ICD-10-CM | POA: Diagnosis not present

## 2021-01-01 DIAGNOSIS — R269 Unspecified abnormalities of gait and mobility: Secondary | ICD-10-CM | POA: Diagnosis not present

## 2021-01-01 DIAGNOSIS — W19XXXA Unspecified fall, initial encounter: Secondary | ICD-10-CM | POA: Diagnosis not present

## 2021-01-05 DIAGNOSIS — R269 Unspecified abnormalities of gait and mobility: Secondary | ICD-10-CM | POA: Diagnosis not present

## 2021-01-05 DIAGNOSIS — Z789 Other specified health status: Secondary | ICD-10-CM | POA: Diagnosis not present

## 2021-01-05 DIAGNOSIS — Z20828 Contact with and (suspected) exposure to other viral communicable diseases: Secondary | ICD-10-CM | POA: Diagnosis not present

## 2021-01-05 DIAGNOSIS — R059 Cough, unspecified: Secondary | ICD-10-CM | POA: Diagnosis not present

## 2021-01-05 DIAGNOSIS — F0150 Vascular dementia without behavioral disturbance: Secondary | ICD-10-CM | POA: Diagnosis not present

## 2021-01-05 DIAGNOSIS — R5383 Other fatigue: Secondary | ICD-10-CM | POA: Diagnosis not present

## 2021-01-06 DIAGNOSIS — D649 Anemia, unspecified: Secondary | ICD-10-CM | POA: Diagnosis not present

## 2021-01-06 DIAGNOSIS — Z5181 Encounter for therapeutic drug level monitoring: Secondary | ICD-10-CM | POA: Diagnosis not present

## 2021-01-08 DIAGNOSIS — E569 Vitamin deficiency, unspecified: Secondary | ICD-10-CM | POA: Diagnosis not present

## 2021-01-09 DIAGNOSIS — Z87440 Personal history of urinary (tract) infections: Secondary | ICD-10-CM | POA: Diagnosis not present

## 2021-01-09 DIAGNOSIS — R4182 Altered mental status, unspecified: Secondary | ICD-10-CM | POA: Diagnosis not present

## 2021-01-09 DIAGNOSIS — F0150 Vascular dementia without behavioral disturbance: Secondary | ICD-10-CM | POA: Diagnosis not present

## 2021-01-09 DIAGNOSIS — E569 Vitamin deficiency, unspecified: Secondary | ICD-10-CM | POA: Diagnosis not present

## 2021-01-09 DIAGNOSIS — Z20828 Contact with and (suspected) exposure to other viral communicable diseases: Secondary | ICD-10-CM | POA: Diagnosis not present

## 2021-01-09 DIAGNOSIS — M6281 Muscle weakness (generalized): Secondary | ICD-10-CM | POA: Diagnosis not present

## 2021-01-09 DIAGNOSIS — R5383 Other fatigue: Secondary | ICD-10-CM | POA: Diagnosis not present

## 2021-01-09 DIAGNOSIS — Z789 Other specified health status: Secondary | ICD-10-CM | POA: Diagnosis not present

## 2021-01-12 DIAGNOSIS — R269 Unspecified abnormalities of gait and mobility: Secondary | ICD-10-CM | POA: Diagnosis not present

## 2021-01-12 DIAGNOSIS — F0150 Vascular dementia without behavioral disturbance: Secondary | ICD-10-CM | POA: Diagnosis not present

## 2021-01-13 DIAGNOSIS — R269 Unspecified abnormalities of gait and mobility: Secondary | ICD-10-CM | POA: Diagnosis not present

## 2021-01-13 DIAGNOSIS — F0150 Vascular dementia without behavioral disturbance: Secondary | ICD-10-CM | POA: Diagnosis not present

## 2021-01-14 DIAGNOSIS — L308 Other specified dermatitis: Secondary | ICD-10-CM | POA: Diagnosis not present

## 2021-01-14 DIAGNOSIS — F0150 Vascular dementia without behavioral disturbance: Secondary | ICD-10-CM | POA: Diagnosis not present

## 2021-01-19 DIAGNOSIS — R269 Unspecified abnormalities of gait and mobility: Secondary | ICD-10-CM | POA: Diagnosis not present

## 2021-01-19 DIAGNOSIS — F0150 Vascular dementia without behavioral disturbance: Secondary | ICD-10-CM | POA: Diagnosis not present

## 2021-02-03 DIAGNOSIS — R051 Acute cough: Secondary | ICD-10-CM | POA: Diagnosis not present

## 2021-02-03 DIAGNOSIS — R2689 Other abnormalities of gait and mobility: Secondary | ICD-10-CM | POA: Diagnosis not present

## 2021-02-03 DIAGNOSIS — W19XXXA Unspecified fall, initial encounter: Secondary | ICD-10-CM | POA: Diagnosis not present

## 2021-02-03 DIAGNOSIS — Z9181 History of falling: Secondary | ICD-10-CM | POA: Diagnosis not present

## 2021-02-03 DIAGNOSIS — M6281 Muscle weakness (generalized): Secondary | ICD-10-CM | POA: Diagnosis not present

## 2021-02-03 DIAGNOSIS — D649 Anemia, unspecified: Secondary | ICD-10-CM | POA: Diagnosis not present

## 2021-02-03 DIAGNOSIS — S51011A Laceration without foreign body of right elbow, initial encounter: Secondary | ICD-10-CM | POA: Diagnosis not present

## 2021-02-03 DIAGNOSIS — U071 COVID-19: Secondary | ICD-10-CM | POA: Diagnosis not present

## 2021-02-03 DIAGNOSIS — F0390 Unspecified dementia without behavioral disturbance: Secondary | ICD-10-CM | POA: Diagnosis not present

## 2021-02-06 DIAGNOSIS — Z043 Encounter for examination and observation following other accident: Secondary | ICD-10-CM | POA: Diagnosis not present

## 2021-02-06 DIAGNOSIS — M19041 Primary osteoarthritis, right hand: Secondary | ICD-10-CM | POA: Diagnosis not present

## 2021-02-06 DIAGNOSIS — N390 Urinary tract infection, site not specified: Secondary | ICD-10-CM | POA: Diagnosis not present

## 2021-02-06 DIAGNOSIS — R52 Pain, unspecified: Secondary | ICD-10-CM | POA: Diagnosis not present

## 2021-02-06 DIAGNOSIS — F0390 Unspecified dementia without behavioral disturbance: Secondary | ICD-10-CM | POA: Diagnosis not present

## 2021-02-06 DIAGNOSIS — W19XXXA Unspecified fall, initial encounter: Secondary | ICD-10-CM | POA: Diagnosis not present

## 2021-02-06 DIAGNOSIS — M19011 Primary osteoarthritis, right shoulder: Secondary | ICD-10-CM | POA: Diagnosis not present

## 2021-02-06 DIAGNOSIS — M19021 Primary osteoarthritis, right elbow: Secondary | ICD-10-CM | POA: Diagnosis not present

## 2021-02-09 DIAGNOSIS — D649 Anemia, unspecified: Secondary | ICD-10-CM | POA: Diagnosis not present

## 2021-02-09 DIAGNOSIS — R059 Cough, unspecified: Secondary | ICD-10-CM | POA: Diagnosis not present

## 2021-02-09 DIAGNOSIS — I10 Essential (primary) hypertension: Secondary | ICD-10-CM | POA: Diagnosis not present

## 2021-02-14 ENCOUNTER — Emergency Department (HOSPITAL_COMMUNITY)
Admission: EM | Admit: 2021-02-14 | Discharge: 2021-02-15 | Disposition: A | Discharge status: Home / Self Care | Payer: Medicare Other | Attending: Emergency Medicine | Admitting: Emergency Medicine

## 2021-02-14 ENCOUNTER — Other Ambulatory Visit: Payer: Self-pay

## 2021-02-14 DIAGNOSIS — I6523 Occlusion and stenosis of bilateral carotid arteries: Secondary | ICD-10-CM | POA: Diagnosis not present

## 2021-02-14 DIAGNOSIS — I2510 Atherosclerotic heart disease of native coronary artery without angina pectoris: Secondary | ICD-10-CM | POA: Diagnosis not present

## 2021-02-14 DIAGNOSIS — I639 Cerebral infarction, unspecified: Secondary | ICD-10-CM | POA: Diagnosis not present

## 2021-02-14 DIAGNOSIS — T148XXA Other injury of unspecified body region, initial encounter: Secondary | ICD-10-CM

## 2021-02-14 DIAGNOSIS — W19XXXA Unspecified fall, initial encounter: Secondary | ICD-10-CM | POA: Diagnosis not present

## 2021-02-14 DIAGNOSIS — S0990XA Unspecified injury of head, initial encounter: Secondary | ICD-10-CM | POA: Diagnosis not present

## 2021-02-14 DIAGNOSIS — I10 Essential (primary) hypertension: Secondary | ICD-10-CM | POA: Diagnosis not present

## 2021-02-14 DIAGNOSIS — Z743 Need for continuous supervision: Secondary | ICD-10-CM | POA: Diagnosis not present

## 2021-02-14 DIAGNOSIS — S0001XA Abrasion of scalp, initial encounter: Secondary | ICD-10-CM | POA: Insufficient documentation

## 2021-02-14 DIAGNOSIS — Z79899 Other long term (current) drug therapy: Secondary | ICD-10-CM | POA: Insufficient documentation

## 2021-02-14 DIAGNOSIS — Z7982 Long term (current) use of aspirin: Secondary | ICD-10-CM | POA: Diagnosis not present

## 2021-02-14 DIAGNOSIS — I672 Cerebral atherosclerosis: Secondary | ICD-10-CM | POA: Diagnosis not present

## 2021-02-14 LAB — BASIC METABOLIC PANEL
Anion gap: 6 (ref 5–15)
BUN: 28 mg/dL — ABNORMAL HIGH (ref 8–23)
CO2: 24 mmol/L (ref 22–32)
Calcium: 8.4 mg/dL — ABNORMAL LOW (ref 8.9–10.3)
Chloride: 101 mmol/L (ref 98–111)
Creatinine, Ser: 1.29 mg/dL — ABNORMAL HIGH (ref 0.61–1.24)
GFR, Estimated: 51 mL/min — ABNORMAL LOW (ref >60)
Glucose, Bld: 123 mg/dL — ABNORMAL HIGH (ref 70–99)
Potassium: 4.5 mmol/L (ref 3.5–5.1)
Sodium: 131 mmol/L — ABNORMAL LOW (ref 135–145)

## 2021-02-14 LAB — CBC
HCT: 38.8 % — ABNORMAL LOW (ref 39.0–52.0)
Hemoglobin: 12.3 g/dL — ABNORMAL LOW (ref 13.0–17.0)
MCH: 30.2 pg (ref 26.0–34.0)
MCHC: 31.7 g/dL (ref 30.0–36.0)
MCV: 95.3 fL (ref 80.0–100.0)
Platelets: 261 10*3/uL (ref 150–400)
RBC: 4.07 MIL/uL — ABNORMAL LOW (ref 4.22–5.81)
RDW: 14.3 % (ref 11.5–15.5)
WBC: 8.6 10*3/uL (ref 4.0–10.5)
nRBC: 0 % (ref 0.0–0.2)

## 2021-02-14 NOTE — ED Provider Notes (Signed)
[facility] EMERGENCY DEPARTMENT Provider Note   CSN: [id number] Arrival date & time: 02/14/21  2216     History  Chief Complaint  Patient presents with   [name] is a 88 y.o. male.   Fall Pertinent negatives include no headaches.  Patient has a history of hypertension hyperlipidemia thrombocytopenia coronary artery disease, microinfarction, sinus bradycardia Patient resides at a nursing facility.  He had an unwitnessed fall and was found lying on the floor with some blood noted on his head.  Patient states he thinks he fell backwards.  He does not exactly remember what happened.  He is denying any pain.  He denies any headache or neck pain.  No arm pain.  No chest pain or abdominal pain.  No fevers or shortness of breath  Home Medications Prior to Admission medications   Medication Sig Start Date End Date Taking? Authorizing Provider  acetaminophen (TYLENOL 8 HOUR) 650 MG CR tablet Take 1 tablet (650 mg total) by mouth in the morning and at bedtime. 02/25/20   Plotnikov, Evie Lacks, MD  aspirin 81 MG EC tablet Take 81 mg by mouth daily.      [provider]  b complex vitamins tablet Take 1 tablet by mouth daily.      [provider]  bisacodyl (DULCOLAX) 10 MG suppository Place 1 suppository (10 mg total) rectally as needed for moderate constipation. 06/13/19   Maudie Flakes, MD  Cholecalciferol 1000 UNITS tablet Take 1,000 Units by mouth daily.      [provider]  clotrimazole-betamethasone (LOTRISONE) cream Apply 1 application topically 2 (two) times daily. 02/25/20 02/24/21  Plotnikov, Evie Lacks, MD  finasteride (PROSCAR) 5 MG tablet Take 1 tablet (5 mg total) by mouth daily. 02/27/19   Plotnikov, Evie Lacks, MD  Glucosamine-Chondroitin 500-400 MG CAPS Take 1 capsule by mouth 2 (two) times daily.    [provider]  isosorbide mononitrate (IMDUR) 30 MG 24 hr tablet Take 1 tablet (30 mg total) by mouth daily.  02/27/19   Plotnikov, Evie Lacks, MD  lidocaine (LIDODERM) 5 % Place 1 patch onto the skin daily. Remove & Discard patch within 12 hours or as directed by MD Patient not taking: Reported on 12/27/2019 12/20/19   Palumbo, April, MD  nitroGLYCERIN (NITROSTAT) 0.4 MG SL tablet Place 1 tablet (0.4 mg total) under the tongue every 5 (five) minutes as needed for chest pain (Please call your doctor/911 if no improvement after the second dose). 02/19/19   Plotnikov, Evie Lacks, MD  pantoprazole (PROTONIX) 40 MG tablet Take 1 tablet (40 mg total) by mouth daily. Patient taking differently: Take 40 mg by mouth daily at 6 (six) AM.  02/27/19   Plotnikov, Evie Lacks, MD  polyethylene glycol (MIRALAX) 17 g packet Take 17 g by mouth daily. 06/13/19   Maudie Flakes, MD  simvastatin (ZOCOR) 40 MG tablet Take 1 tablet (40 mg total) by mouth at bedtime. 10/29/19 01/27/20  Plotnikov, Evie Lacks, MD  tamsulosin (FLOMAX) 0.4 MG CAPS capsule Take 1 capsule (0.4 mg total) by mouth daily after breakfast. 02/27/19   Plotnikov, Evie Lacks, MD  vitamin C (ASCORBIC ACID) 500 MG tablet Take 500 mg by mouth daily.    [provider]      Allergies    Oxycodone and Penicillins    Review of Systems   Review of Systems  Constitutional:  Negative for fever.  Neurological:  Negative for headaches.  Physical Exam Updated Vital Signs BP 127/67    Pulse 63    Temp 98 F (36.7 C)    Resp 16    SpO2 94%  Physical Exam Vitals and nursing note reviewed.  Constitutional:      General: He is not in acute distress.    Appearance: He is well-developed.  HENT:     Head: Normocephalic.     Comments: Small abrasion left parietal scalp, no step-off no hematoma    Right Ear: External ear normal.     Left Ear: External ear normal.  Eyes:     General: No scleral icterus.       Right eye: No discharge.        Left eye: No discharge.     Conjunctiva/sclera: Conjunctivae normal.  Neck:     Trachea: No tracheal deviation.   Cardiovascular:     Rate and Rhythm: Normal rate and regular rhythm.  Pulmonary:     Effort: Pulmonary effort is normal. No respiratory distress.     Breath sounds: Normal breath sounds. No stridor. No wheezing or rales.  Abdominal:     General: Bowel sounds are normal. There is no distension.     Palpations: Abdomen is soft.     Tenderness: There is no abdominal tenderness. There is no guarding or rebound.  Musculoskeletal:        General: No tenderness or deformity.     Cervical back: Neck supple.     Comments: Full range of motion bilateral upper extremities and lower extremities without pain or discomfort, no cervical thoracic or lumbar spine tenderness  Skin:    General: Skin is warm and dry.     Findings: No rash.  Neurological:     General: No focal deficit present.     Mental Status: He is alert.     Cranial Nerves: No cranial nerve deficit (no facial droop, extraocular movements intact, no slurred speech).     Sensory: No sensory deficit.     Motor: No abnormal muscle tone or seizure activity.     Coordination: Coordination normal.  Psychiatric:        Mood and Affect: Mood normal.    ED Results / Procedures / Treatments   Labs (all labs ordered are listed, but only abnormal results are displayed) Labs Reviewed  CBC - Abnormal; Notable for the following components:      Result Value   RBC 4.07 (*)    Hemoglobin 12.3 (*)    HCT 38.8 (*)    All other components within normal limits  BASIC METABOLIC PANEL    EKG EKG Interpretation  Date/Time:  Saturday February 14 2021 22:17:04 EST Ventricular Rate:  59 PR Interval:  154 QRS Duration: 112 QT Interval:  423 QTC Calculation: 419 R Axis:   -58 Text Interpretation: Sinus rhythm Left anterior fascicular block Abnormal R-wave progression, early transition Nonspecific T abnormalities, lateral leads No significant change since last tracing Confirmed by Dorie Rank [phone]) on 02/14/2021 10:24:00 PM  Radiology No  results found.  Procedures Procedures    Medications Ordered in ED Medications - No data to display  ED Course/ Medical Decision Making/ A&P                           Medical Decision Making  Patient presented to ED for evaluation after fall at the nursing facility.  Patient denies any complaints.  Initial CBC was normal.  Metabolic  panel and CT scans have been ordered and are pending.  Care turned over to Dr Laverta Baltimore        Final Clinical Impression(s) / ED Diagnoses pending   Dorie Rank, MD 02/14/21 2332

## 2021-02-14 NOTE — ED Provider Notes (Signed)
Blood pressure 127/67, pulse 63, temperature 98 F (36.7 C), resp. rate 16, SpO2 94 %.  Assuming care from Dr. Tomi Bamberger.  In short, [name] is a 88 y.o. male with a chief complaint of Fall .  Refer to the original H&P for additional details.  The current plan of care is to follow up on labs and CT imaging. Patient coming to the ED from SNF.    EKG Interpretation  Date/Time:  Saturday February 14 2021 22:17:04 EST Ventricular Rate:  59 PR Interval:  154 QRS Duration: 112 QT Interval:  423 QTC Calculation: 419 R Axis:   -58 Text Interpretation: Sinus rhythm Left anterior fascicular block Abnormal R-wave progression, early transition Nonspecific T abnormalities, lateral leads No significant change since last tracing Confirmed by Dorie Rank [phone]) on 02/14/2021 10:24:00 PM       01:15 AM  Lab work reviewed.  CBC without leukocytosis or severe anemia.  Mild hyponatremia consistent with prior values.  I independently evaluated the CT head and cervical spine imaging and agree with radiology interpretation.  Patient has a faint abrasion to the vertex of his scalp.  No laceration requiring staple or suture.  Plan for discharge back to the facility.  Vitals are within normal limits. Patient resting comfortably.     Margette Fast, MD 02/15/21 0120

## 2021-02-14 NOTE — ED Triage Notes (Signed)
Pt arrives via [facility] from [facility] after an unwitnessed fall. Pt was found laying on the floor w/ a lac to top L of head w/ some blood on the ground. Pt has dementia and is alert to self only at baseline. Pt denies any pain, states he does not remember falling, all vitals stable.

## 2021-02-15 ENCOUNTER — Emergency Department (HOSPITAL_COMMUNITY): Payer: Medicare Other

## 2021-02-15 DIAGNOSIS — S0001XA Abrasion of scalp, initial encounter: Secondary | ICD-10-CM | POA: Diagnosis not present

## 2021-02-15 DIAGNOSIS — I672 Cerebral atherosclerosis: Secondary | ICD-10-CM | POA: Diagnosis not present

## 2021-02-15 DIAGNOSIS — R0902 Hypoxemia: Secondary | ICD-10-CM | POA: Diagnosis not present

## 2021-02-15 DIAGNOSIS — Z96641 Presence of right artificial hip joint: Secondary | ICD-10-CM | POA: Diagnosis not present

## 2021-02-15 DIAGNOSIS — W19XXXA Unspecified fall, initial encounter: Secondary | ICD-10-CM | POA: Diagnosis not present

## 2021-02-15 DIAGNOSIS — Z743 Need for continuous supervision: Secondary | ICD-10-CM | POA: Diagnosis not present

## 2021-02-15 DIAGNOSIS — M25551 Pain in right hip: Secondary | ICD-10-CM | POA: Diagnosis not present

## 2021-02-15 DIAGNOSIS — M79651 Pain in right thigh: Secondary | ICD-10-CM | POA: Diagnosis not present

## 2021-02-15 DIAGNOSIS — I6523 Occlusion and stenosis of bilateral carotid arteries: Secondary | ICD-10-CM | POA: Diagnosis not present

## 2021-02-15 DIAGNOSIS — I639 Cerebral infarction, unspecified: Secondary | ICD-10-CM | POA: Diagnosis not present

## 2021-02-15 NOTE — ED Notes (Signed)
[facility] called for transport back to [facility]

## 2021-02-15 NOTE — Discharge Instructions (Signed)
You were seen in the emergency room today after a fall.  A CT scan of your head and neck showed no broken bones or bleeding.  Please continue your home medications and follow closely with your primary care doctor in the coming week to review your ED visit and home medications.  You may clean your scalp abrasion with soap and warm water.

## 2021-02-15 NOTE — ED Notes (Signed)
Patient verbalizes understanding of discharge instructions. Opportunity for questioning and answers were provided. Armband removed by staff, pt discharged from ED. Left via [facility]  

## 2021-02-16 DIAGNOSIS — R52 Pain, unspecified: Secondary | ICD-10-CM | POA: Diagnosis not present

## 2021-02-16 DIAGNOSIS — W19XXXA Unspecified fall, initial encounter: Secondary | ICD-10-CM | POA: Diagnosis not present

## 2021-02-16 DIAGNOSIS — Z9181 History of falling: Secondary | ICD-10-CM | POA: Diagnosis not present

## 2021-02-16 DIAGNOSIS — Z8616 Personal history of COVID-19: Secondary | ICD-10-CM | POA: Diagnosis not present

## 2021-02-16 DIAGNOSIS — M6281 Muscle weakness (generalized): Secondary | ICD-10-CM | POA: Diagnosis not present

## 2021-02-16 DIAGNOSIS — R2689 Other abnormalities of gait and mobility: Secondary | ICD-10-CM | POA: Diagnosis not present

## 2021-02-16 DIAGNOSIS — F0390 Unspecified dementia without behavioral disturbance: Secondary | ICD-10-CM | POA: Diagnosis not present

## 2021-02-16 DIAGNOSIS — S0091XA Abrasion of unspecified part of head, initial encounter: Secondary | ICD-10-CM | POA: Diagnosis not present

## 2021-02-18 DIAGNOSIS — G8911 Acute pain due to trauma: Secondary | ICD-10-CM | POA: Diagnosis not present

## 2021-02-18 DIAGNOSIS — R41841 Cognitive communication deficit: Secondary | ICD-10-CM | POA: Diagnosis not present

## 2021-02-18 DIAGNOSIS — F0150 Vascular dementia without behavioral disturbance: Secondary | ICD-10-CM | POA: Diagnosis not present

## 2021-02-18 DIAGNOSIS — R2689 Other abnormalities of gait and mobility: Secondary | ICD-10-CM | POA: Diagnosis not present

## 2021-02-19 DIAGNOSIS — R41841 Cognitive communication deficit: Secondary | ICD-10-CM | POA: Diagnosis not present

## 2021-02-19 DIAGNOSIS — F0150 Vascular dementia without behavioral disturbance: Secondary | ICD-10-CM | POA: Diagnosis not present

## 2021-02-19 DIAGNOSIS — R2689 Other abnormalities of gait and mobility: Secondary | ICD-10-CM | POA: Diagnosis not present

## 2021-02-20 DIAGNOSIS — F0150 Vascular dementia without behavioral disturbance: Secondary | ICD-10-CM | POA: Diagnosis not present

## 2021-02-20 DIAGNOSIS — R41841 Cognitive communication deficit: Secondary | ICD-10-CM | POA: Diagnosis not present

## 2021-02-20 DIAGNOSIS — R2689 Other abnormalities of gait and mobility: Secondary | ICD-10-CM | POA: Diagnosis not present

## 2021-02-23 DIAGNOSIS — S20419A Abrasion of unspecified back wall of thorax, initial encounter: Secondary | ICD-10-CM | POA: Diagnosis not present

## 2021-02-23 DIAGNOSIS — F0150 Vascular dementia without behavioral disturbance: Secondary | ICD-10-CM | POA: Diagnosis not present

## 2021-02-23 DIAGNOSIS — F0390 Unspecified dementia without behavioral disturbance: Secondary | ICD-10-CM | POA: Diagnosis not present

## 2021-02-23 DIAGNOSIS — W19XXXA Unspecified fall, initial encounter: Secondary | ICD-10-CM | POA: Diagnosis not present

## 2021-02-23 DIAGNOSIS — R41841 Cognitive communication deficit: Secondary | ICD-10-CM | POA: Diagnosis not present

## 2021-02-23 DIAGNOSIS — Z9181 History of falling: Secondary | ICD-10-CM | POA: Diagnosis not present

## 2021-02-23 DIAGNOSIS — R2689 Other abnormalities of gait and mobility: Secondary | ICD-10-CM | POA: Diagnosis not present

## 2021-02-23 DIAGNOSIS — M6281 Muscle weakness (generalized): Secondary | ICD-10-CM | POA: Diagnosis not present

## 2021-02-24 DIAGNOSIS — R41841 Cognitive communication deficit: Secondary | ICD-10-CM | POA: Diagnosis not present

## 2021-02-24 DIAGNOSIS — F0150 Vascular dementia without behavioral disturbance: Secondary | ICD-10-CM | POA: Diagnosis not present

## 2021-02-24 DIAGNOSIS — R2689 Other abnormalities of gait and mobility: Secondary | ICD-10-CM | POA: Diagnosis not present

## 2021-02-25 DIAGNOSIS — R41841 Cognitive communication deficit: Secondary | ICD-10-CM | POA: Diagnosis not present

## 2021-02-25 DIAGNOSIS — I10 Essential (primary) hypertension: Secondary | ICD-10-CM | POA: Diagnosis not present

## 2021-02-25 DIAGNOSIS — N182 Chronic kidney disease, stage 2 (mild): Secondary | ICD-10-CM | POA: Diagnosis not present

## 2021-02-25 DIAGNOSIS — R627 Adult failure to thrive: Secondary | ICD-10-CM | POA: Diagnosis not present

## 2021-02-25 DIAGNOSIS — R2689 Other abnormalities of gait and mobility: Secondary | ICD-10-CM | POA: Diagnosis not present

## 2021-02-25 DIAGNOSIS — D649 Anemia, unspecified: Secondary | ICD-10-CM | POA: Diagnosis not present

## 2021-02-25 DIAGNOSIS — F0390 Unspecified dementia without behavioral disturbance: Secondary | ICD-10-CM | POA: Diagnosis not present

## 2021-02-25 DIAGNOSIS — R4182 Altered mental status, unspecified: Secondary | ICD-10-CM | POA: Diagnosis not present

## 2021-02-25 DIAGNOSIS — F0150 Vascular dementia without behavioral disturbance: Secondary | ICD-10-CM | POA: Diagnosis not present

## 2021-02-26 DIAGNOSIS — R41841 Cognitive communication deficit: Secondary | ICD-10-CM | POA: Diagnosis not present

## 2021-02-26 DIAGNOSIS — F0150 Vascular dementia without behavioral disturbance: Secondary | ICD-10-CM | POA: Diagnosis not present

## 2021-02-26 DIAGNOSIS — R2689 Other abnormalities of gait and mobility: Secondary | ICD-10-CM | POA: Diagnosis not present

## 2021-02-27 DIAGNOSIS — M1990 Unspecified osteoarthritis, unspecified site: Secondary | ICD-10-CM | POA: Diagnosis not present

## 2021-02-27 DIAGNOSIS — I2510 Atherosclerotic heart disease of native coronary artery without angina pectoris: Secondary | ICD-10-CM | POA: Diagnosis not present

## 2021-02-27 DIAGNOSIS — I255 Ischemic cardiomyopathy: Secondary | ICD-10-CM | POA: Diagnosis not present

## 2021-02-27 DIAGNOSIS — Z8616 Personal history of COVID-19: Secondary | ICD-10-CM | POA: Diagnosis not present

## 2021-02-27 DIAGNOSIS — R296 Repeated falls: Secondary | ICD-10-CM | POA: Diagnosis not present

## 2021-02-27 DIAGNOSIS — I10 Essential (primary) hypertension: Secondary | ICD-10-CM | POA: Diagnosis not present

## 2021-02-27 DIAGNOSIS — Z8679 Personal history of other diseases of the circulatory system: Secondary | ICD-10-CM | POA: Diagnosis not present

## 2021-02-27 DIAGNOSIS — L821 Other seborrheic keratosis: Secondary | ICD-10-CM | POA: Diagnosis not present

## 2021-02-27 DIAGNOSIS — G309 Alzheimer's disease, unspecified: Secondary | ICD-10-CM | POA: Diagnosis not present

## 2021-02-27 DIAGNOSIS — K219 Gastro-esophageal reflux disease without esophagitis: Secondary | ICD-10-CM | POA: Diagnosis not present

## 2021-02-27 DIAGNOSIS — N400 Enlarged prostate without lower urinary tract symptoms: Secondary | ICD-10-CM | POA: Diagnosis not present

## 2021-02-27 DIAGNOSIS — F0280 Dementia in other diseases classified elsewhere without behavioral disturbance: Secondary | ICD-10-CM | POA: Diagnosis not present

## 2021-02-27 DIAGNOSIS — I252 Old myocardial infarction: Secondary | ICD-10-CM | POA: Diagnosis not present

## 2021-02-28 DIAGNOSIS — I252 Old myocardial infarction: Secondary | ICD-10-CM | POA: Diagnosis not present

## 2021-02-28 DIAGNOSIS — Z8616 Personal history of COVID-19: Secondary | ICD-10-CM | POA: Diagnosis not present

## 2021-02-28 DIAGNOSIS — G309 Alzheimer's disease, unspecified: Secondary | ICD-10-CM | POA: Diagnosis not present

## 2021-02-28 DIAGNOSIS — F0280 Dementia in other diseases classified elsewhere without behavioral disturbance: Secondary | ICD-10-CM | POA: Diagnosis not present

## 2021-02-28 DIAGNOSIS — I2510 Atherosclerotic heart disease of native coronary artery without angina pectoris: Secondary | ICD-10-CM | POA: Diagnosis not present

## 2021-02-28 DIAGNOSIS — R296 Repeated falls: Secondary | ICD-10-CM | POA: Diagnosis not present

## 2021-03-03 DIAGNOSIS — F0280 Dementia in other diseases classified elsewhere without behavioral disturbance: Secondary | ICD-10-CM | POA: Diagnosis not present

## 2021-03-03 DIAGNOSIS — R296 Repeated falls: Secondary | ICD-10-CM | POA: Diagnosis not present

## 2021-03-03 DIAGNOSIS — G309 Alzheimer's disease, unspecified: Secondary | ICD-10-CM | POA: Diagnosis not present

## 2021-03-03 DIAGNOSIS — I252 Old myocardial infarction: Secondary | ICD-10-CM | POA: Diagnosis not present

## 2021-03-03 DIAGNOSIS — I2510 Atherosclerotic heart disease of native coronary artery without angina pectoris: Secondary | ICD-10-CM | POA: Diagnosis not present

## 2021-03-03 DIAGNOSIS — Z8616 Personal history of COVID-19: Secondary | ICD-10-CM | POA: Diagnosis not present

## 2021-03-04 DIAGNOSIS — M1990 Unspecified osteoarthritis, unspecified site: Secondary | ICD-10-CM | POA: Diagnosis not present

## 2021-03-04 DIAGNOSIS — Z8616 Personal history of COVID-19: Secondary | ICD-10-CM | POA: Diagnosis not present

## 2021-03-04 DIAGNOSIS — I10 Essential (primary) hypertension: Secondary | ICD-10-CM | POA: Diagnosis not present

## 2021-03-04 DIAGNOSIS — Z8679 Personal history of other diseases of the circulatory system: Secondary | ICD-10-CM | POA: Diagnosis not present

## 2021-03-04 DIAGNOSIS — F0280 Dementia in other diseases classified elsewhere without behavioral disturbance: Secondary | ICD-10-CM | POA: Diagnosis not present

## 2021-03-04 DIAGNOSIS — I255 Ischemic cardiomyopathy: Secondary | ICD-10-CM | POA: Diagnosis not present

## 2021-03-04 DIAGNOSIS — G309 Alzheimer's disease, unspecified: Secondary | ICD-10-CM | POA: Diagnosis not present

## 2021-03-04 DIAGNOSIS — I2510 Atherosclerotic heart disease of native coronary artery without angina pectoris: Secondary | ICD-10-CM | POA: Diagnosis not present

## 2021-03-04 DIAGNOSIS — I252 Old myocardial infarction: Secondary | ICD-10-CM | POA: Diagnosis not present

## 2021-03-04 DIAGNOSIS — K219 Gastro-esophageal reflux disease without esophagitis: Secondary | ICD-10-CM | POA: Diagnosis not present

## 2021-03-04 DIAGNOSIS — N400 Enlarged prostate without lower urinary tract symptoms: Secondary | ICD-10-CM | POA: Diagnosis not present

## 2021-03-04 DIAGNOSIS — L821 Other seborrheic keratosis: Secondary | ICD-10-CM | POA: Diagnosis not present

## 2021-03-04 DIAGNOSIS — R296 Repeated falls: Secondary | ICD-10-CM | POA: Diagnosis not present

## 2021-03-09 DIAGNOSIS — F0280 Dementia in other diseases classified elsewhere without behavioral disturbance: Secondary | ICD-10-CM | POA: Diagnosis not present

## 2021-03-09 DIAGNOSIS — I2510 Atherosclerotic heart disease of native coronary artery without angina pectoris: Secondary | ICD-10-CM | POA: Diagnosis not present

## 2021-03-09 DIAGNOSIS — I252 Old myocardial infarction: Secondary | ICD-10-CM | POA: Diagnosis not present

## 2021-03-09 DIAGNOSIS — G309 Alzheimer's disease, unspecified: Secondary | ICD-10-CM | POA: Diagnosis not present

## 2021-03-09 DIAGNOSIS — Z8616 Personal history of COVID-19: Secondary | ICD-10-CM | POA: Diagnosis not present

## 2021-03-09 DIAGNOSIS — Z20822 Contact with and (suspected) exposure to covid-19: Secondary | ICD-10-CM | POA: Diagnosis not present

## 2021-03-09 DIAGNOSIS — R296 Repeated falls: Secondary | ICD-10-CM | POA: Diagnosis not present

## 2021-03-11 DIAGNOSIS — R296 Repeated falls: Secondary | ICD-10-CM | POA: Diagnosis not present

## 2021-03-11 DIAGNOSIS — Z8616 Personal history of COVID-19: Secondary | ICD-10-CM | POA: Diagnosis not present

## 2021-03-11 DIAGNOSIS — F0280 Dementia in other diseases classified elsewhere without behavioral disturbance: Secondary | ICD-10-CM | POA: Diagnosis not present

## 2021-03-11 DIAGNOSIS — G309 Alzheimer's disease, unspecified: Secondary | ICD-10-CM | POA: Diagnosis not present

## 2021-03-11 DIAGNOSIS — I252 Old myocardial infarction: Secondary | ICD-10-CM | POA: Diagnosis not present

## 2021-03-11 DIAGNOSIS — I2510 Atherosclerotic heart disease of native coronary artery without angina pectoris: Secondary | ICD-10-CM | POA: Diagnosis not present

## 2021-03-13 DIAGNOSIS — F0280 Dementia in other diseases classified elsewhere without behavioral disturbance: Secondary | ICD-10-CM | POA: Diagnosis not present

## 2021-03-13 DIAGNOSIS — G309 Alzheimer's disease, unspecified: Secondary | ICD-10-CM | POA: Diagnosis not present

## 2021-03-13 DIAGNOSIS — Z8616 Personal history of COVID-19: Secondary | ICD-10-CM | POA: Diagnosis not present

## 2021-03-13 DIAGNOSIS — I2510 Atherosclerotic heart disease of native coronary artery without angina pectoris: Secondary | ICD-10-CM | POA: Diagnosis not present

## 2021-03-13 DIAGNOSIS — R296 Repeated falls: Secondary | ICD-10-CM | POA: Diagnosis not present

## 2021-03-13 DIAGNOSIS — I252 Old myocardial infarction: Secondary | ICD-10-CM | POA: Diagnosis not present

## 2021-03-15 DIAGNOSIS — G309 Alzheimer's disease, unspecified: Secondary | ICD-10-CM | POA: Diagnosis not present

## 2021-03-15 DIAGNOSIS — Z8616 Personal history of COVID-19: Secondary | ICD-10-CM | POA: Diagnosis not present

## 2021-03-15 DIAGNOSIS — I2510 Atherosclerotic heart disease of native coronary artery without angina pectoris: Secondary | ICD-10-CM | POA: Diagnosis not present

## 2021-03-15 DIAGNOSIS — R296 Repeated falls: Secondary | ICD-10-CM | POA: Diagnosis not present

## 2021-03-15 DIAGNOSIS — F0280 Dementia in other diseases classified elsewhere without behavioral disturbance: Secondary | ICD-10-CM | POA: Diagnosis not present

## 2021-03-15 DIAGNOSIS — I252 Old myocardial infarction: Secondary | ICD-10-CM | POA: Diagnosis not present

## 2021-03-17 DIAGNOSIS — F0280 Dementia in other diseases classified elsewhere without behavioral disturbance: Secondary | ICD-10-CM | POA: Diagnosis not present

## 2021-03-17 DIAGNOSIS — G309 Alzheimer's disease, unspecified: Secondary | ICD-10-CM | POA: Diagnosis not present

## 2021-03-17 DIAGNOSIS — Z8616 Personal history of COVID-19: Secondary | ICD-10-CM | POA: Diagnosis not present

## 2021-03-17 DIAGNOSIS — R296 Repeated falls: Secondary | ICD-10-CM | POA: Diagnosis not present

## 2021-03-17 DIAGNOSIS — I2510 Atherosclerotic heart disease of native coronary artery without angina pectoris: Secondary | ICD-10-CM | POA: Diagnosis not present

## 2021-03-17 DIAGNOSIS — I252 Old myocardial infarction: Secondary | ICD-10-CM | POA: Diagnosis not present

## 2021-03-18 DIAGNOSIS — F0280 Dementia in other diseases classified elsewhere without behavioral disturbance: Secondary | ICD-10-CM | POA: Diagnosis not present

## 2021-03-18 DIAGNOSIS — G309 Alzheimer's disease, unspecified: Secondary | ICD-10-CM | POA: Diagnosis not present

## 2021-03-18 DIAGNOSIS — Z8616 Personal history of COVID-19: Secondary | ICD-10-CM | POA: Diagnosis not present

## 2021-03-18 DIAGNOSIS — Z66 Do not resuscitate: Secondary | ICD-10-CM | POA: Diagnosis not present

## 2021-03-18 DIAGNOSIS — R296 Repeated falls: Secondary | ICD-10-CM | POA: Diagnosis not present

## 2021-03-18 DIAGNOSIS — I2510 Atherosclerotic heart disease of native coronary artery without angina pectoris: Secondary | ICD-10-CM | POA: Diagnosis not present

## 2021-03-18 DIAGNOSIS — I252 Old myocardial infarction: Secondary | ICD-10-CM | POA: Diagnosis not present

## 2021-03-22 DIAGNOSIS — I252 Old myocardial infarction: Secondary | ICD-10-CM | POA: Diagnosis not present

## 2021-03-22 DIAGNOSIS — F0280 Dementia in other diseases classified elsewhere without behavioral disturbance: Secondary | ICD-10-CM | POA: Diagnosis not present

## 2021-03-22 DIAGNOSIS — Z8616 Personal history of COVID-19: Secondary | ICD-10-CM | POA: Diagnosis not present

## 2021-03-22 DIAGNOSIS — R296 Repeated falls: Secondary | ICD-10-CM | POA: Diagnosis not present

## 2021-03-22 DIAGNOSIS — I2510 Atherosclerotic heart disease of native coronary artery without angina pectoris: Secondary | ICD-10-CM | POA: Diagnosis not present

## 2021-03-22 DIAGNOSIS — G309 Alzheimer's disease, unspecified: Secondary | ICD-10-CM | POA: Diagnosis not present

## 2021-03-23 DIAGNOSIS — F0390 Unspecified dementia without behavioral disturbance: Secondary | ICD-10-CM | POA: Diagnosis not present

## 2021-03-23 DIAGNOSIS — M6281 Muscle weakness (generalized): Secondary | ICD-10-CM | POA: Diagnosis not present

## 2021-03-23 DIAGNOSIS — Z8616 Personal history of COVID-19: Secondary | ICD-10-CM | POA: Diagnosis not present

## 2021-03-23 DIAGNOSIS — G309 Alzheimer's disease, unspecified: Secondary | ICD-10-CM | POA: Diagnosis not present

## 2021-03-23 DIAGNOSIS — I252 Old myocardial infarction: Secondary | ICD-10-CM | POA: Diagnosis not present

## 2021-03-23 DIAGNOSIS — Z9181 History of falling: Secondary | ICD-10-CM | POA: Diagnosis not present

## 2021-03-23 DIAGNOSIS — I2510 Atherosclerotic heart disease of native coronary artery without angina pectoris: Secondary | ICD-10-CM | POA: Diagnosis not present

## 2021-03-23 DIAGNOSIS — R296 Repeated falls: Secondary | ICD-10-CM | POA: Diagnosis not present

## 2021-03-23 DIAGNOSIS — R52 Pain, unspecified: Secondary | ICD-10-CM | POA: Diagnosis not present

## 2021-03-23 DIAGNOSIS — F0280 Dementia in other diseases classified elsewhere without behavioral disturbance: Secondary | ICD-10-CM | POA: Diagnosis not present

## 2021-03-23 DIAGNOSIS — W19XXXA Unspecified fall, initial encounter: Secondary | ICD-10-CM | POA: Diagnosis not present

## 2021-03-23 DIAGNOSIS — R2689 Other abnormalities of gait and mobility: Secondary | ICD-10-CM | POA: Diagnosis not present

## 2021-03-25 DIAGNOSIS — F0280 Dementia in other diseases classified elsewhere without behavioral disturbance: Secondary | ICD-10-CM | POA: Diagnosis not present

## 2021-03-25 DIAGNOSIS — Z8616 Personal history of COVID-19: Secondary | ICD-10-CM | POA: Diagnosis not present

## 2021-03-25 DIAGNOSIS — I2510 Atherosclerotic heart disease of native coronary artery without angina pectoris: Secondary | ICD-10-CM | POA: Diagnosis not present

## 2021-03-25 DIAGNOSIS — G309 Alzheimer's disease, unspecified: Secondary | ICD-10-CM | POA: Diagnosis not present

## 2021-03-25 DIAGNOSIS — R296 Repeated falls: Secondary | ICD-10-CM | POA: Diagnosis not present

## 2021-03-25 DIAGNOSIS — I252 Old myocardial infarction: Secondary | ICD-10-CM | POA: Diagnosis not present

## 2021-03-29 DIAGNOSIS — F0280 Dementia in other diseases classified elsewhere without behavioral disturbance: Secondary | ICD-10-CM | POA: Diagnosis not present

## 2021-03-29 DIAGNOSIS — R296 Repeated falls: Secondary | ICD-10-CM | POA: Diagnosis not present

## 2021-03-29 DIAGNOSIS — Z8616 Personal history of COVID-19: Secondary | ICD-10-CM | POA: Diagnosis not present

## 2021-03-29 DIAGNOSIS — G309 Alzheimer's disease, unspecified: Secondary | ICD-10-CM | POA: Diagnosis not present

## 2021-03-29 DIAGNOSIS — I252 Old myocardial infarction: Secondary | ICD-10-CM | POA: Diagnosis not present

## 2021-03-29 DIAGNOSIS — I2510 Atherosclerotic heart disease of native coronary artery without angina pectoris: Secondary | ICD-10-CM | POA: Diagnosis not present

## 2021-04-01 DIAGNOSIS — I252 Old myocardial infarction: Secondary | ICD-10-CM | POA: Diagnosis not present

## 2021-04-01 DIAGNOSIS — R296 Repeated falls: Secondary | ICD-10-CM | POA: Diagnosis not present

## 2021-04-01 DIAGNOSIS — I2510 Atherosclerotic heart disease of native coronary artery without angina pectoris: Secondary | ICD-10-CM | POA: Diagnosis not present

## 2021-04-01 DIAGNOSIS — Z8616 Personal history of COVID-19: Secondary | ICD-10-CM | POA: Diagnosis not present

## 2021-04-01 DIAGNOSIS — I255 Ischemic cardiomyopathy: Secondary | ICD-10-CM | POA: Diagnosis not present

## 2021-04-01 DIAGNOSIS — M1990 Unspecified osteoarthritis, unspecified site: Secondary | ICD-10-CM | POA: Diagnosis not present

## 2021-04-01 DIAGNOSIS — L821 Other seborrheic keratosis: Secondary | ICD-10-CM | POA: Diagnosis not present

## 2021-04-01 DIAGNOSIS — F0280 Dementia in other diseases classified elsewhere without behavioral disturbance: Secondary | ICD-10-CM | POA: Diagnosis not present

## 2021-04-01 DIAGNOSIS — Z8679 Personal history of other diseases of the circulatory system: Secondary | ICD-10-CM | POA: Diagnosis not present

## 2021-04-01 DIAGNOSIS — N400 Enlarged prostate without lower urinary tract symptoms: Secondary | ICD-10-CM | POA: Diagnosis not present

## 2021-04-01 DIAGNOSIS — G309 Alzheimer's disease, unspecified: Secondary | ICD-10-CM | POA: Diagnosis not present

## 2021-04-01 DIAGNOSIS — K219 Gastro-esophageal reflux disease without esophagitis: Secondary | ICD-10-CM | POA: Diagnosis not present

## 2021-04-01 DIAGNOSIS — I10 Essential (primary) hypertension: Secondary | ICD-10-CM | POA: Diagnosis not present

## 2021-04-06 DIAGNOSIS — R296 Repeated falls: Secondary | ICD-10-CM | POA: Diagnosis not present

## 2021-04-06 DIAGNOSIS — F0280 Dementia in other diseases classified elsewhere without behavioral disturbance: Secondary | ICD-10-CM | POA: Diagnosis not present

## 2021-04-06 DIAGNOSIS — G309 Alzheimer's disease, unspecified: Secondary | ICD-10-CM | POA: Diagnosis not present

## 2021-04-06 DIAGNOSIS — I252 Old myocardial infarction: Secondary | ICD-10-CM | POA: Diagnosis not present

## 2021-04-06 DIAGNOSIS — Z8616 Personal history of COVID-19: Secondary | ICD-10-CM | POA: Diagnosis not present

## 2021-04-06 DIAGNOSIS — I2510 Atherosclerotic heart disease of native coronary artery without angina pectoris: Secondary | ICD-10-CM | POA: Diagnosis not present

## 2021-04-08 DIAGNOSIS — F0390 Unspecified dementia without behavioral disturbance: Secondary | ICD-10-CM | POA: Diagnosis not present

## 2021-04-08 DIAGNOSIS — L89316 Pressure-induced deep tissue damage of right buttock: Secondary | ICD-10-CM | POA: Diagnosis not present

## 2021-04-08 DIAGNOSIS — I252 Old myocardial infarction: Secondary | ICD-10-CM | POA: Diagnosis not present

## 2021-04-08 DIAGNOSIS — R627 Adult failure to thrive: Secondary | ICD-10-CM | POA: Diagnosis not present

## 2021-04-08 DIAGNOSIS — L89326 Pressure-induced deep tissue damage of left buttock: Secondary | ICD-10-CM | POA: Diagnosis not present

## 2021-04-08 DIAGNOSIS — I2510 Atherosclerotic heart disease of native coronary artery without angina pectoris: Secondary | ICD-10-CM | POA: Diagnosis not present

## 2021-04-08 DIAGNOSIS — F0280 Dementia in other diseases classified elsewhere without behavioral disturbance: Secondary | ICD-10-CM | POA: Diagnosis not present

## 2021-04-08 DIAGNOSIS — G309 Alzheimer's disease, unspecified: Secondary | ICD-10-CM | POA: Diagnosis not present

## 2021-04-08 DIAGNOSIS — R296 Repeated falls: Secondary | ICD-10-CM | POA: Diagnosis not present

## 2021-04-08 DIAGNOSIS — Z8616 Personal history of COVID-19: Secondary | ICD-10-CM | POA: Diagnosis not present

## 2021-04-10 DIAGNOSIS — I2510 Atherosclerotic heart disease of native coronary artery without angina pectoris: Secondary | ICD-10-CM | POA: Diagnosis not present

## 2021-04-10 DIAGNOSIS — R296 Repeated falls: Secondary | ICD-10-CM | POA: Diagnosis not present

## 2021-04-10 DIAGNOSIS — G309 Alzheimer's disease, unspecified: Secondary | ICD-10-CM | POA: Diagnosis not present

## 2021-04-10 DIAGNOSIS — F0280 Dementia in other diseases classified elsewhere without behavioral disturbance: Secondary | ICD-10-CM | POA: Diagnosis not present

## 2021-04-10 DIAGNOSIS — Z8616 Personal history of COVID-19: Secondary | ICD-10-CM | POA: Diagnosis not present

## 2021-04-10 DIAGNOSIS — I252 Old myocardial infarction: Secondary | ICD-10-CM | POA: Diagnosis not present

## 2021-04-13 DIAGNOSIS — G309 Alzheimer's disease, unspecified: Secondary | ICD-10-CM | POA: Diagnosis not present

## 2021-04-13 DIAGNOSIS — Z8616 Personal history of COVID-19: Secondary | ICD-10-CM | POA: Diagnosis not present

## 2021-04-13 DIAGNOSIS — I2510 Atherosclerotic heart disease of native coronary artery without angina pectoris: Secondary | ICD-10-CM | POA: Diagnosis not present

## 2021-04-13 DIAGNOSIS — I252 Old myocardial infarction: Secondary | ICD-10-CM | POA: Diagnosis not present

## 2021-04-13 DIAGNOSIS — F0280 Dementia in other diseases classified elsewhere without behavioral disturbance: Secondary | ICD-10-CM | POA: Diagnosis not present

## 2021-04-13 DIAGNOSIS — R296 Repeated falls: Secondary | ICD-10-CM | POA: Diagnosis not present

## 2021-04-15 DIAGNOSIS — Z8616 Personal history of COVID-19: Secondary | ICD-10-CM | POA: Diagnosis not present

## 2021-04-15 DIAGNOSIS — I252 Old myocardial infarction: Secondary | ICD-10-CM | POA: Diagnosis not present

## 2021-04-15 DIAGNOSIS — G309 Alzheimer's disease, unspecified: Secondary | ICD-10-CM | POA: Diagnosis not present

## 2021-04-15 DIAGNOSIS — I2510 Atherosclerotic heart disease of native coronary artery without angina pectoris: Secondary | ICD-10-CM | POA: Diagnosis not present

## 2021-04-15 DIAGNOSIS — R296 Repeated falls: Secondary | ICD-10-CM | POA: Diagnosis not present

## 2021-04-15 DIAGNOSIS — F0280 Dementia in other diseases classified elsewhere without behavioral disturbance: Secondary | ICD-10-CM | POA: Diagnosis not present

## 2021-04-16 DIAGNOSIS — Z7689 Persons encountering health services in other specified circumstances: Secondary | ICD-10-CM | POA: Diagnosis not present

## 2021-04-16 DIAGNOSIS — R627 Adult failure to thrive: Secondary | ICD-10-CM | POA: Diagnosis not present

## 2021-04-16 DIAGNOSIS — F0394 Unspecified dementia, unspecified severity, with anxiety: Secondary | ICD-10-CM | POA: Diagnosis not present

## 2021-04-20 DIAGNOSIS — I2510 Atherosclerotic heart disease of native coronary artery without angina pectoris: Secondary | ICD-10-CM | POA: Diagnosis not present

## 2021-04-20 DIAGNOSIS — F0280 Dementia in other diseases classified elsewhere without behavioral disturbance: Secondary | ICD-10-CM | POA: Diagnosis not present

## 2021-04-20 DIAGNOSIS — M6281 Muscle weakness (generalized): Secondary | ICD-10-CM | POA: Diagnosis not present

## 2021-04-20 DIAGNOSIS — R627 Adult failure to thrive: Secondary | ICD-10-CM | POA: Diagnosis not present

## 2021-04-20 DIAGNOSIS — R2689 Other abnormalities of gait and mobility: Secondary | ICD-10-CM | POA: Diagnosis not present

## 2021-04-20 DIAGNOSIS — F0394 Unspecified dementia, unspecified severity, with anxiety: Secondary | ICD-10-CM | POA: Diagnosis not present

## 2021-04-20 DIAGNOSIS — G309 Alzheimer's disease, unspecified: Secondary | ICD-10-CM | POA: Diagnosis not present

## 2021-04-20 DIAGNOSIS — Z8616 Personal history of COVID-19: Secondary | ICD-10-CM | POA: Diagnosis not present

## 2021-04-20 DIAGNOSIS — Z9181 History of falling: Secondary | ICD-10-CM | POA: Diagnosis not present

## 2021-04-20 DIAGNOSIS — R296 Repeated falls: Secondary | ICD-10-CM | POA: Diagnosis not present

## 2021-04-20 DIAGNOSIS — I252 Old myocardial infarction: Secondary | ICD-10-CM | POA: Diagnosis not present

## 2021-04-22 DIAGNOSIS — R296 Repeated falls: Secondary | ICD-10-CM | POA: Diagnosis not present

## 2021-04-22 DIAGNOSIS — R627 Adult failure to thrive: Secondary | ICD-10-CM | POA: Diagnosis not present

## 2021-04-22 DIAGNOSIS — F0280 Dementia in other diseases classified elsewhere without behavioral disturbance: Secondary | ICD-10-CM | POA: Diagnosis not present

## 2021-04-22 DIAGNOSIS — R197 Diarrhea, unspecified: Secondary | ICD-10-CM | POA: Diagnosis not present

## 2021-04-22 DIAGNOSIS — F0390 Unspecified dementia without behavioral disturbance: Secondary | ICD-10-CM | POA: Diagnosis not present

## 2021-04-22 DIAGNOSIS — I2510 Atherosclerotic heart disease of native coronary artery without angina pectoris: Secondary | ICD-10-CM | POA: Diagnosis not present

## 2021-04-22 DIAGNOSIS — R051 Acute cough: Secondary | ICD-10-CM | POA: Diagnosis not present

## 2021-04-22 DIAGNOSIS — G309 Alzheimer's disease, unspecified: Secondary | ICD-10-CM | POA: Diagnosis not present

## 2021-04-22 DIAGNOSIS — Z8616 Personal history of COVID-19: Secondary | ICD-10-CM | POA: Diagnosis not present

## 2021-04-22 DIAGNOSIS — I252 Old myocardial infarction: Secondary | ICD-10-CM | POA: Diagnosis not present

## 2021-04-24 DIAGNOSIS — I2510 Atherosclerotic heart disease of native coronary artery without angina pectoris: Secondary | ICD-10-CM | POA: Diagnosis not present

## 2021-04-24 DIAGNOSIS — F0280 Dementia in other diseases classified elsewhere without behavioral disturbance: Secondary | ICD-10-CM | POA: Diagnosis not present

## 2021-04-24 DIAGNOSIS — Z8616 Personal history of COVID-19: Secondary | ICD-10-CM | POA: Diagnosis not present

## 2021-04-24 DIAGNOSIS — I252 Old myocardial infarction: Secondary | ICD-10-CM | POA: Diagnosis not present

## 2021-04-24 DIAGNOSIS — G309 Alzheimer's disease, unspecified: Secondary | ICD-10-CM | POA: Diagnosis not present

## 2021-04-24 DIAGNOSIS — R296 Repeated falls: Secondary | ICD-10-CM | POA: Diagnosis not present

## 2021-04-27 DIAGNOSIS — I2510 Atherosclerotic heart disease of native coronary artery without angina pectoris: Secondary | ICD-10-CM | POA: Diagnosis not present

## 2021-04-27 DIAGNOSIS — F0280 Dementia in other diseases classified elsewhere without behavioral disturbance: Secondary | ICD-10-CM | POA: Diagnosis not present

## 2021-04-27 DIAGNOSIS — R296 Repeated falls: Secondary | ICD-10-CM | POA: Diagnosis not present

## 2021-04-27 DIAGNOSIS — Z8616 Personal history of COVID-19: Secondary | ICD-10-CM | POA: Diagnosis not present

## 2021-04-27 DIAGNOSIS — I252 Old myocardial infarction: Secondary | ICD-10-CM | POA: Diagnosis not present

## 2021-04-27 DIAGNOSIS — G309 Alzheimer's disease, unspecified: Secondary | ICD-10-CM | POA: Diagnosis not present

## 2021-04-29 DIAGNOSIS — R296 Repeated falls: Secondary | ICD-10-CM | POA: Diagnosis not present

## 2021-04-29 DIAGNOSIS — I2510 Atherosclerotic heart disease of native coronary artery without angina pectoris: Secondary | ICD-10-CM | POA: Diagnosis not present

## 2021-04-29 DIAGNOSIS — F0280 Dementia in other diseases classified elsewhere without behavioral disturbance: Secondary | ICD-10-CM | POA: Diagnosis not present

## 2021-04-29 DIAGNOSIS — I252 Old myocardial infarction: Secondary | ICD-10-CM | POA: Diagnosis not present

## 2021-04-29 DIAGNOSIS — Z8616 Personal history of COVID-19: Secondary | ICD-10-CM | POA: Diagnosis not present

## 2021-04-29 DIAGNOSIS — G309 Alzheimer's disease, unspecified: Secondary | ICD-10-CM | POA: Diagnosis not present

## 2021-04-30 DIAGNOSIS — Z20822 Contact with and (suspected) exposure to covid-19: Secondary | ICD-10-CM | POA: Diagnosis not present

## 2021-05-01 DIAGNOSIS — F0390 Unspecified dementia without behavioral disturbance: Secondary | ICD-10-CM | POA: Diagnosis not present

## 2021-05-01 DIAGNOSIS — L24A2 Irritant contact dermatitis due to fecal, urinary or dual incontinence: Secondary | ICD-10-CM | POA: Diagnosis not present

## 2021-05-01 DIAGNOSIS — I252 Old myocardial infarction: Secondary | ICD-10-CM | POA: Diagnosis not present

## 2021-05-01 DIAGNOSIS — Z8616 Personal history of COVID-19: Secondary | ICD-10-CM | POA: Diagnosis not present

## 2021-05-01 DIAGNOSIS — I2510 Atherosclerotic heart disease of native coronary artery without angina pectoris: Secondary | ICD-10-CM | POA: Diagnosis not present

## 2021-05-01 DIAGNOSIS — G309 Alzheimer's disease, unspecified: Secondary | ICD-10-CM | POA: Diagnosis not present

## 2021-05-01 DIAGNOSIS — F0280 Dementia in other diseases classified elsewhere without behavioral disturbance: Secondary | ICD-10-CM | POA: Diagnosis not present

## 2021-05-01 DIAGNOSIS — R627 Adult failure to thrive: Secondary | ICD-10-CM | POA: Diagnosis not present

## 2021-05-01 DIAGNOSIS — R296 Repeated falls: Secondary | ICD-10-CM | POA: Diagnosis not present

## 2021-05-02 DIAGNOSIS — R296 Repeated falls: Secondary | ICD-10-CM | POA: Diagnosis not present

## 2021-05-02 DIAGNOSIS — N400 Enlarged prostate without lower urinary tract symptoms: Secondary | ICD-10-CM | POA: Diagnosis not present

## 2021-05-02 DIAGNOSIS — I2510 Atherosclerotic heart disease of native coronary artery without angina pectoris: Secondary | ICD-10-CM | POA: Diagnosis not present

## 2021-05-02 DIAGNOSIS — L821 Other seborrheic keratosis: Secondary | ICD-10-CM | POA: Diagnosis not present

## 2021-05-02 DIAGNOSIS — F0280 Dementia in other diseases classified elsewhere without behavioral disturbance: Secondary | ICD-10-CM | POA: Diagnosis not present

## 2021-05-02 DIAGNOSIS — G309 Alzheimer's disease, unspecified: Secondary | ICD-10-CM | POA: Diagnosis not present

## 2021-05-02 DIAGNOSIS — I252 Old myocardial infarction: Secondary | ICD-10-CM | POA: Diagnosis not present

## 2021-05-02 DIAGNOSIS — M1990 Unspecified osteoarthritis, unspecified site: Secondary | ICD-10-CM | POA: Diagnosis not present

## 2021-05-02 DIAGNOSIS — K219 Gastro-esophageal reflux disease without esophagitis: Secondary | ICD-10-CM | POA: Diagnosis not present

## 2021-05-02 DIAGNOSIS — I255 Ischemic cardiomyopathy: Secondary | ICD-10-CM | POA: Diagnosis not present

## 2021-05-02 DIAGNOSIS — I10 Essential (primary) hypertension: Secondary | ICD-10-CM | POA: Diagnosis not present

## 2021-05-02 DIAGNOSIS — Z8679 Personal history of other diseases of the circulatory system: Secondary | ICD-10-CM | POA: Diagnosis not present

## 2021-05-02 DIAGNOSIS — Z8616 Personal history of COVID-19: Secondary | ICD-10-CM | POA: Diagnosis not present

## 2021-05-04 DIAGNOSIS — Z8616 Personal history of COVID-19: Secondary | ICD-10-CM | POA: Diagnosis not present

## 2021-05-04 DIAGNOSIS — G309 Alzheimer's disease, unspecified: Secondary | ICD-10-CM | POA: Diagnosis not present

## 2021-05-04 DIAGNOSIS — I252 Old myocardial infarction: Secondary | ICD-10-CM | POA: Diagnosis not present

## 2021-05-04 DIAGNOSIS — I2510 Atherosclerotic heart disease of native coronary artery without angina pectoris: Secondary | ICD-10-CM | POA: Diagnosis not present

## 2021-05-04 DIAGNOSIS — R296 Repeated falls: Secondary | ICD-10-CM | POA: Diagnosis not present

## 2021-05-04 DIAGNOSIS — F0280 Dementia in other diseases classified elsewhere without behavioral disturbance: Secondary | ICD-10-CM | POA: Diagnosis not present

## 2021-05-05 DIAGNOSIS — F0280 Dementia in other diseases classified elsewhere without behavioral disturbance: Secondary | ICD-10-CM | POA: Diagnosis not present

## 2021-05-05 DIAGNOSIS — Z8616 Personal history of COVID-19: Secondary | ICD-10-CM | POA: Diagnosis not present

## 2021-05-05 DIAGNOSIS — I252 Old myocardial infarction: Secondary | ICD-10-CM | POA: Diagnosis not present

## 2021-05-05 DIAGNOSIS — G309 Alzheimer's disease, unspecified: Secondary | ICD-10-CM | POA: Diagnosis not present

## 2021-05-05 DIAGNOSIS — I2510 Atherosclerotic heart disease of native coronary artery without angina pectoris: Secondary | ICD-10-CM | POA: Diagnosis not present

## 2021-05-05 DIAGNOSIS — R296 Repeated falls: Secondary | ICD-10-CM | POA: Diagnosis not present

## 2021-05-06 DIAGNOSIS — F0280 Dementia in other diseases classified elsewhere without behavioral disturbance: Secondary | ICD-10-CM | POA: Diagnosis not present

## 2021-05-06 DIAGNOSIS — G309 Alzheimer's disease, unspecified: Secondary | ICD-10-CM | POA: Diagnosis not present

## 2021-05-06 DIAGNOSIS — Z8616 Personal history of COVID-19: Secondary | ICD-10-CM | POA: Diagnosis not present

## 2021-05-06 DIAGNOSIS — I2510 Atherosclerotic heart disease of native coronary artery without angina pectoris: Secondary | ICD-10-CM | POA: Diagnosis not present

## 2021-05-06 DIAGNOSIS — I252 Old myocardial infarction: Secondary | ICD-10-CM | POA: Diagnosis not present

## 2021-05-06 DIAGNOSIS — R296 Repeated falls: Secondary | ICD-10-CM | POA: Diagnosis not present

## 2021-05-07 DIAGNOSIS — R296 Repeated falls: Secondary | ICD-10-CM | POA: Diagnosis not present

## 2021-05-07 DIAGNOSIS — Z8616 Personal history of COVID-19: Secondary | ICD-10-CM | POA: Diagnosis not present

## 2021-05-07 DIAGNOSIS — F0280 Dementia in other diseases classified elsewhere without behavioral disturbance: Secondary | ICD-10-CM | POA: Diagnosis not present

## 2021-05-07 DIAGNOSIS — I2510 Atherosclerotic heart disease of native coronary artery without angina pectoris: Secondary | ICD-10-CM | POA: Diagnosis not present

## 2021-05-07 DIAGNOSIS — H1033 Unspecified acute conjunctivitis, bilateral: Secondary | ICD-10-CM | POA: Diagnosis not present

## 2021-05-07 DIAGNOSIS — I252 Old myocardial infarction: Secondary | ICD-10-CM | POA: Diagnosis not present

## 2021-05-07 DIAGNOSIS — G309 Alzheimer's disease, unspecified: Secondary | ICD-10-CM | POA: Diagnosis not present

## 2021-05-08 DIAGNOSIS — G309 Alzheimer's disease, unspecified: Secondary | ICD-10-CM | POA: Diagnosis not present

## 2021-05-08 DIAGNOSIS — I252 Old myocardial infarction: Secondary | ICD-10-CM | POA: Diagnosis not present

## 2021-05-08 DIAGNOSIS — Z8616 Personal history of COVID-19: Secondary | ICD-10-CM | POA: Diagnosis not present

## 2021-05-08 DIAGNOSIS — R296 Repeated falls: Secondary | ICD-10-CM | POA: Diagnosis not present

## 2021-05-08 DIAGNOSIS — F0280 Dementia in other diseases classified elsewhere without behavioral disturbance: Secondary | ICD-10-CM | POA: Diagnosis not present

## 2021-05-08 DIAGNOSIS — I2510 Atherosclerotic heart disease of native coronary artery without angina pectoris: Secondary | ICD-10-CM | POA: Diagnosis not present

## 2021-05-11 DIAGNOSIS — Z8616 Personal history of COVID-19: Secondary | ICD-10-CM | POA: Diagnosis not present

## 2021-05-11 DIAGNOSIS — F0280 Dementia in other diseases classified elsewhere without behavioral disturbance: Secondary | ICD-10-CM | POA: Diagnosis not present

## 2021-05-11 DIAGNOSIS — I2510 Atherosclerotic heart disease of native coronary artery without angina pectoris: Secondary | ICD-10-CM | POA: Diagnosis not present

## 2021-05-11 DIAGNOSIS — R296 Repeated falls: Secondary | ICD-10-CM | POA: Diagnosis not present

## 2021-05-11 DIAGNOSIS — G309 Alzheimer's disease, unspecified: Secondary | ICD-10-CM | POA: Diagnosis not present

## 2021-05-11 DIAGNOSIS — I252 Old myocardial infarction: Secondary | ICD-10-CM | POA: Diagnosis not present

## 2021-05-13 DIAGNOSIS — R296 Repeated falls: Secondary | ICD-10-CM | POA: Diagnosis not present

## 2021-05-13 DIAGNOSIS — Z8616 Personal history of COVID-19: Secondary | ICD-10-CM | POA: Diagnosis not present

## 2021-05-13 DIAGNOSIS — F0280 Dementia in other diseases classified elsewhere without behavioral disturbance: Secondary | ICD-10-CM | POA: Diagnosis not present

## 2021-05-13 DIAGNOSIS — I252 Old myocardial infarction: Secondary | ICD-10-CM | POA: Diagnosis not present

## 2021-05-13 DIAGNOSIS — G309 Alzheimer's disease, unspecified: Secondary | ICD-10-CM | POA: Diagnosis not present

## 2021-05-13 DIAGNOSIS — I2510 Atherosclerotic heart disease of native coronary artery without angina pectoris: Secondary | ICD-10-CM | POA: Diagnosis not present

## 2021-05-14 DIAGNOSIS — Z20822 Contact with and (suspected) exposure to covid-19: Secondary | ICD-10-CM | POA: Diagnosis not present

## 2021-05-15 DIAGNOSIS — G309 Alzheimer's disease, unspecified: Secondary | ICD-10-CM | POA: Diagnosis not present

## 2021-05-15 DIAGNOSIS — I252 Old myocardial infarction: Secondary | ICD-10-CM | POA: Diagnosis not present

## 2021-05-15 DIAGNOSIS — F0280 Dementia in other diseases classified elsewhere without behavioral disturbance: Secondary | ICD-10-CM | POA: Diagnosis not present

## 2021-05-15 DIAGNOSIS — Z8616 Personal history of COVID-19: Secondary | ICD-10-CM | POA: Diagnosis not present

## 2021-05-15 DIAGNOSIS — I2510 Atherosclerotic heart disease of native coronary artery without angina pectoris: Secondary | ICD-10-CM | POA: Diagnosis not present

## 2021-05-15 DIAGNOSIS — R296 Repeated falls: Secondary | ICD-10-CM | POA: Diagnosis not present

## 2021-05-18 DIAGNOSIS — G309 Alzheimer's disease, unspecified: Secondary | ICD-10-CM | POA: Diagnosis not present

## 2021-05-18 DIAGNOSIS — R296 Repeated falls: Secondary | ICD-10-CM | POA: Diagnosis not present

## 2021-05-18 DIAGNOSIS — Z8616 Personal history of COVID-19: Secondary | ICD-10-CM | POA: Diagnosis not present

## 2021-05-18 DIAGNOSIS — I252 Old myocardial infarction: Secondary | ICD-10-CM | POA: Diagnosis not present

## 2021-05-18 DIAGNOSIS — I2510 Atherosclerotic heart disease of native coronary artery without angina pectoris: Secondary | ICD-10-CM | POA: Diagnosis not present

## 2021-05-18 DIAGNOSIS — Z20822 Contact with and (suspected) exposure to covid-19: Secondary | ICD-10-CM | POA: Diagnosis not present

## 2021-05-18 DIAGNOSIS — F0280 Dementia in other diseases classified elsewhere without behavioral disturbance: Secondary | ICD-10-CM | POA: Diagnosis not present

## 2021-05-19 DIAGNOSIS — F0390 Unspecified dementia without behavioral disturbance: Secondary | ICD-10-CM | POA: Diagnosis not present

## 2021-05-19 DIAGNOSIS — R627 Adult failure to thrive: Secondary | ICD-10-CM | POA: Diagnosis not present

## 2021-05-19 DIAGNOSIS — L24A2 Irritant contact dermatitis due to fecal, urinary or dual incontinence: Secondary | ICD-10-CM | POA: Diagnosis not present

## 2021-05-20 DIAGNOSIS — G309 Alzheimer's disease, unspecified: Secondary | ICD-10-CM | POA: Diagnosis not present

## 2021-05-20 DIAGNOSIS — F0280 Dementia in other diseases classified elsewhere without behavioral disturbance: Secondary | ICD-10-CM | POA: Diagnosis not present

## 2021-05-20 DIAGNOSIS — I252 Old myocardial infarction: Secondary | ICD-10-CM | POA: Diagnosis not present

## 2021-05-20 DIAGNOSIS — Z8616 Personal history of COVID-19: Secondary | ICD-10-CM | POA: Diagnosis not present

## 2021-05-20 DIAGNOSIS — R296 Repeated falls: Secondary | ICD-10-CM | POA: Diagnosis not present

## 2021-05-20 DIAGNOSIS — I2510 Atherosclerotic heart disease of native coronary artery without angina pectoris: Secondary | ICD-10-CM | POA: Diagnosis not present

## 2021-05-22 DIAGNOSIS — R296 Repeated falls: Secondary | ICD-10-CM | POA: Diagnosis not present

## 2021-05-22 DIAGNOSIS — I2510 Atherosclerotic heart disease of native coronary artery without angina pectoris: Secondary | ICD-10-CM | POA: Diagnosis not present

## 2021-05-22 DIAGNOSIS — G309 Alzheimer's disease, unspecified: Secondary | ICD-10-CM | POA: Diagnosis not present

## 2021-05-22 DIAGNOSIS — F0280 Dementia in other diseases classified elsewhere without behavioral disturbance: Secondary | ICD-10-CM | POA: Diagnosis not present

## 2021-05-22 DIAGNOSIS — Z8616 Personal history of COVID-19: Secondary | ICD-10-CM | POA: Diagnosis not present

## 2021-05-22 DIAGNOSIS — I252 Old myocardial infarction: Secondary | ICD-10-CM | POA: Diagnosis not present

## 2021-05-25 DIAGNOSIS — F0280 Dementia in other diseases classified elsewhere without behavioral disturbance: Secondary | ICD-10-CM | POA: Diagnosis not present

## 2021-05-25 DIAGNOSIS — I252 Old myocardial infarction: Secondary | ICD-10-CM | POA: Diagnosis not present

## 2021-05-25 DIAGNOSIS — R296 Repeated falls: Secondary | ICD-10-CM | POA: Diagnosis not present

## 2021-05-25 DIAGNOSIS — I2510 Atherosclerotic heart disease of native coronary artery without angina pectoris: Secondary | ICD-10-CM | POA: Diagnosis not present

## 2021-05-25 DIAGNOSIS — Z8616 Personal history of COVID-19: Secondary | ICD-10-CM | POA: Diagnosis not present

## 2021-05-25 DIAGNOSIS — G309 Alzheimer's disease, unspecified: Secondary | ICD-10-CM | POA: Diagnosis not present

## 2021-05-27 DIAGNOSIS — R051 Acute cough: Secondary | ICD-10-CM | POA: Diagnosis not present

## 2021-05-27 DIAGNOSIS — R296 Repeated falls: Secondary | ICD-10-CM | POA: Diagnosis not present

## 2021-05-27 DIAGNOSIS — I252 Old myocardial infarction: Secondary | ICD-10-CM | POA: Diagnosis not present

## 2021-05-27 DIAGNOSIS — R059 Cough, unspecified: Secondary | ICD-10-CM | POA: Diagnosis not present

## 2021-05-27 DIAGNOSIS — G309 Alzheimer's disease, unspecified: Secondary | ICD-10-CM | POA: Diagnosis not present

## 2021-05-27 DIAGNOSIS — F0280 Dementia in other diseases classified elsewhere without behavioral disturbance: Secondary | ICD-10-CM | POA: Diagnosis not present

## 2021-05-27 DIAGNOSIS — Z20822 Contact with and (suspected) exposure to covid-19: Secondary | ICD-10-CM | POA: Diagnosis not present

## 2021-05-27 DIAGNOSIS — I2510 Atherosclerotic heart disease of native coronary artery without angina pectoris: Secondary | ICD-10-CM | POA: Diagnosis not present

## 2021-05-27 DIAGNOSIS — Z8616 Personal history of COVID-19: Secondary | ICD-10-CM | POA: Diagnosis not present

## 2021-05-28 DIAGNOSIS — J069 Acute upper respiratory infection, unspecified: Secondary | ICD-10-CM | POA: Diagnosis not present

## 2021-05-28 DIAGNOSIS — R451 Restlessness and agitation: Secondary | ICD-10-CM | POA: Diagnosis not present

## 2021-05-28 DIAGNOSIS — I252 Old myocardial infarction: Secondary | ICD-10-CM | POA: Diagnosis not present

## 2021-05-28 DIAGNOSIS — H109 Unspecified conjunctivitis: Secondary | ICD-10-CM | POA: Diagnosis not present

## 2021-05-28 DIAGNOSIS — F0280 Dementia in other diseases classified elsewhere without behavioral disturbance: Secondary | ICD-10-CM | POA: Diagnosis not present

## 2021-05-28 DIAGNOSIS — Z8616 Personal history of COVID-19: Secondary | ICD-10-CM | POA: Diagnosis not present

## 2021-05-28 DIAGNOSIS — G309 Alzheimer's disease, unspecified: Secondary | ICD-10-CM | POA: Diagnosis not present

## 2021-05-28 DIAGNOSIS — R296 Repeated falls: Secondary | ICD-10-CM | POA: Diagnosis not present

## 2021-05-28 DIAGNOSIS — R5381 Other malaise: Secondary | ICD-10-CM | POA: Diagnosis not present

## 2021-05-28 DIAGNOSIS — I2510 Atherosclerotic heart disease of native coronary artery without angina pectoris: Secondary | ICD-10-CM | POA: Diagnosis not present

## 2021-05-31 DIAGNOSIS — I252 Old myocardial infarction: Secondary | ICD-10-CM | POA: Diagnosis not present

## 2021-05-31 DIAGNOSIS — I2510 Atherosclerotic heart disease of native coronary artery without angina pectoris: Secondary | ICD-10-CM | POA: Diagnosis not present

## 2021-05-31 DIAGNOSIS — Z8616 Personal history of COVID-19: Secondary | ICD-10-CM | POA: Diagnosis not present

## 2021-05-31 DIAGNOSIS — F0280 Dementia in other diseases classified elsewhere without behavioral disturbance: Secondary | ICD-10-CM | POA: Diagnosis not present

## 2021-05-31 DIAGNOSIS — G309 Alzheimer's disease, unspecified: Secondary | ICD-10-CM | POA: Diagnosis not present

## 2021-05-31 DIAGNOSIS — R296 Repeated falls: Secondary | ICD-10-CM | POA: Diagnosis not present

## 2021-06-01 DIAGNOSIS — Z8616 Personal history of COVID-19: Secondary | ICD-10-CM | POA: Diagnosis not present

## 2021-06-01 DIAGNOSIS — I2510 Atherosclerotic heart disease of native coronary artery without angina pectoris: Secondary | ICD-10-CM | POA: Diagnosis not present

## 2021-06-01 DIAGNOSIS — H109 Unspecified conjunctivitis: Secondary | ICD-10-CM | POA: Diagnosis not present

## 2021-06-01 DIAGNOSIS — G309 Alzheimer's disease, unspecified: Secondary | ICD-10-CM | POA: Diagnosis not present

## 2021-06-01 DIAGNOSIS — K219 Gastro-esophageal reflux disease without esophagitis: Secondary | ICD-10-CM | POA: Diagnosis not present

## 2021-06-01 DIAGNOSIS — I10 Essential (primary) hypertension: Secondary | ICD-10-CM | POA: Diagnosis not present

## 2021-06-01 DIAGNOSIS — I255 Ischemic cardiomyopathy: Secondary | ICD-10-CM | POA: Diagnosis not present

## 2021-06-01 DIAGNOSIS — N400 Enlarged prostate without lower urinary tract symptoms: Secondary | ICD-10-CM | POA: Diagnosis not present

## 2021-06-01 DIAGNOSIS — Z8679 Personal history of other diseases of the circulatory system: Secondary | ICD-10-CM | POA: Diagnosis not present

## 2021-06-01 DIAGNOSIS — F02811 Dementia in other diseases classified elsewhere, unspecified severity, with agitation: Secondary | ICD-10-CM | POA: Diagnosis not present

## 2021-06-01 DIAGNOSIS — I252 Old myocardial infarction: Secondary | ICD-10-CM | POA: Diagnosis not present

## 2021-06-01 DIAGNOSIS — M1990 Unspecified osteoarthritis, unspecified site: Secondary | ICD-10-CM | POA: Diagnosis not present

## 2021-06-01 DIAGNOSIS — L821 Other seborrheic keratosis: Secondary | ICD-10-CM | POA: Diagnosis not present

## 2021-06-01 DIAGNOSIS — R296 Repeated falls: Secondary | ICD-10-CM | POA: Diagnosis not present

## 2021-06-02 DIAGNOSIS — I2510 Atherosclerotic heart disease of native coronary artery without angina pectoris: Secondary | ICD-10-CM | POA: Diagnosis not present

## 2021-06-02 DIAGNOSIS — G309 Alzheimer's disease, unspecified: Secondary | ICD-10-CM | POA: Diagnosis not present

## 2021-06-02 DIAGNOSIS — F02811 Dementia in other diseases classified elsewhere, unspecified severity, with agitation: Secondary | ICD-10-CM | POA: Diagnosis not present

## 2021-06-02 DIAGNOSIS — R296 Repeated falls: Secondary | ICD-10-CM | POA: Diagnosis not present

## 2021-06-02 DIAGNOSIS — I252 Old myocardial infarction: Secondary | ICD-10-CM | POA: Diagnosis not present

## 2021-06-02 DIAGNOSIS — Z8616 Personal history of COVID-19: Secondary | ICD-10-CM | POA: Diagnosis not present

## 2021-06-03 DIAGNOSIS — G309 Alzheimer's disease, unspecified: Secondary | ICD-10-CM | POA: Diagnosis not present

## 2021-06-03 DIAGNOSIS — Z8616 Personal history of COVID-19: Secondary | ICD-10-CM | POA: Diagnosis not present

## 2021-06-03 DIAGNOSIS — F02811 Dementia in other diseases classified elsewhere, unspecified severity, with agitation: Secondary | ICD-10-CM | POA: Diagnosis not present

## 2021-06-03 DIAGNOSIS — I2510 Atherosclerotic heart disease of native coronary artery without angina pectoris: Secondary | ICD-10-CM | POA: Diagnosis not present

## 2021-06-03 DIAGNOSIS — I252 Old myocardial infarction: Secondary | ICD-10-CM | POA: Diagnosis not present

## 2021-06-03 DIAGNOSIS — R296 Repeated falls: Secondary | ICD-10-CM | POA: Diagnosis not present

## 2021-06-08 DIAGNOSIS — Z20822 Contact with and (suspected) exposure to covid-19: Secondary | ICD-10-CM | POA: Diagnosis not present

## 2021-06-10 DIAGNOSIS — Z8616 Personal history of COVID-19: Secondary | ICD-10-CM | POA: Diagnosis not present

## 2021-06-10 DIAGNOSIS — I2510 Atherosclerotic heart disease of native coronary artery without angina pectoris: Secondary | ICD-10-CM | POA: Diagnosis not present

## 2021-06-10 DIAGNOSIS — F02811 Dementia in other diseases classified elsewhere, unspecified severity, with agitation: Secondary | ICD-10-CM | POA: Diagnosis not present

## 2021-06-10 DIAGNOSIS — I252 Old myocardial infarction: Secondary | ICD-10-CM | POA: Diagnosis not present

## 2021-06-10 DIAGNOSIS — G309 Alzheimer's disease, unspecified: Secondary | ICD-10-CM | POA: Diagnosis not present

## 2021-06-10 DIAGNOSIS — R296 Repeated falls: Secondary | ICD-10-CM | POA: Diagnosis not present

## 2021-06-11 DIAGNOSIS — R296 Repeated falls: Secondary | ICD-10-CM | POA: Diagnosis not present

## 2021-06-11 DIAGNOSIS — G309 Alzheimer's disease, unspecified: Secondary | ICD-10-CM | POA: Diagnosis not present

## 2021-06-11 DIAGNOSIS — I2510 Atherosclerotic heart disease of native coronary artery without angina pectoris: Secondary | ICD-10-CM | POA: Diagnosis not present

## 2021-06-11 DIAGNOSIS — Z8616 Personal history of COVID-19: Secondary | ICD-10-CM | POA: Diagnosis not present

## 2021-06-11 DIAGNOSIS — F02811 Dementia in other diseases classified elsewhere, unspecified severity, with agitation: Secondary | ICD-10-CM | POA: Diagnosis not present

## 2021-06-11 DIAGNOSIS — I252 Old myocardial infarction: Secondary | ICD-10-CM | POA: Diagnosis not present

## 2021-06-12 DIAGNOSIS — G309 Alzheimer's disease, unspecified: Secondary | ICD-10-CM | POA: Diagnosis not present

## 2021-06-12 DIAGNOSIS — I2510 Atherosclerotic heart disease of native coronary artery without angina pectoris: Secondary | ICD-10-CM | POA: Diagnosis not present

## 2021-06-12 DIAGNOSIS — I252 Old myocardial infarction: Secondary | ICD-10-CM | POA: Diagnosis not present

## 2021-06-12 DIAGNOSIS — F02811 Dementia in other diseases classified elsewhere, unspecified severity, with agitation: Secondary | ICD-10-CM | POA: Diagnosis not present

## 2021-06-12 DIAGNOSIS — Z8616 Personal history of COVID-19: Secondary | ICD-10-CM | POA: Diagnosis not present

## 2021-06-12 DIAGNOSIS — R296 Repeated falls: Secondary | ICD-10-CM | POA: Diagnosis not present

## 2021-06-13 DIAGNOSIS — F02811 Dementia in other diseases classified elsewhere, unspecified severity, with agitation: Secondary | ICD-10-CM | POA: Diagnosis not present

## 2021-06-13 DIAGNOSIS — G309 Alzheimer's disease, unspecified: Secondary | ICD-10-CM | POA: Diagnosis not present

## 2021-06-13 DIAGNOSIS — R296 Repeated falls: Secondary | ICD-10-CM | POA: Diagnosis not present

## 2021-06-13 DIAGNOSIS — I2510 Atherosclerotic heart disease of native coronary artery without angina pectoris: Secondary | ICD-10-CM | POA: Diagnosis not present

## 2021-06-13 DIAGNOSIS — I252 Old myocardial infarction: Secondary | ICD-10-CM | POA: Diagnosis not present

## 2021-06-13 DIAGNOSIS — Z8616 Personal history of COVID-19: Secondary | ICD-10-CM | POA: Diagnosis not present

## 2021-06-16 DIAGNOSIS — F02811 Dementia in other diseases classified elsewhere, unspecified severity, with agitation: Secondary | ICD-10-CM | POA: Diagnosis not present

## 2021-06-16 DIAGNOSIS — G309 Alzheimer's disease, unspecified: Secondary | ICD-10-CM | POA: Diagnosis not present

## 2021-06-16 DIAGNOSIS — I2510 Atherosclerotic heart disease of native coronary artery without angina pectoris: Secondary | ICD-10-CM | POA: Diagnosis not present

## 2021-06-16 DIAGNOSIS — R296 Repeated falls: Secondary | ICD-10-CM | POA: Diagnosis not present

## 2021-06-16 DIAGNOSIS — Z8616 Personal history of COVID-19: Secondary | ICD-10-CM | POA: Diagnosis not present

## 2021-06-16 DIAGNOSIS — I252 Old myocardial infarction: Secondary | ICD-10-CM | POA: Diagnosis not present

## 2021-06-17 DIAGNOSIS — I2510 Atherosclerotic heart disease of native coronary artery without angina pectoris: Secondary | ICD-10-CM | POA: Diagnosis not present

## 2021-06-17 DIAGNOSIS — I252 Old myocardial infarction: Secondary | ICD-10-CM | POA: Diagnosis not present

## 2021-06-17 DIAGNOSIS — G309 Alzheimer's disease, unspecified: Secondary | ICD-10-CM | POA: Diagnosis not present

## 2021-06-17 DIAGNOSIS — R296 Repeated falls: Secondary | ICD-10-CM | POA: Diagnosis not present

## 2021-06-17 DIAGNOSIS — Z8616 Personal history of COVID-19: Secondary | ICD-10-CM | POA: Diagnosis not present

## 2021-06-17 DIAGNOSIS — F02811 Dementia in other diseases classified elsewhere, unspecified severity, with agitation: Secondary | ICD-10-CM | POA: Diagnosis not present

## 2021-06-18 DIAGNOSIS — Z8616 Personal history of COVID-19: Secondary | ICD-10-CM | POA: Diagnosis not present

## 2021-06-18 DIAGNOSIS — I252 Old myocardial infarction: Secondary | ICD-10-CM | POA: Diagnosis not present

## 2021-06-18 DIAGNOSIS — I2510 Atherosclerotic heart disease of native coronary artery without angina pectoris: Secondary | ICD-10-CM | POA: Diagnosis not present

## 2021-06-18 DIAGNOSIS — G309 Alzheimer's disease, unspecified: Secondary | ICD-10-CM | POA: Diagnosis not present

## 2021-06-18 DIAGNOSIS — F02811 Dementia in other diseases classified elsewhere, unspecified severity, with agitation: Secondary | ICD-10-CM | POA: Diagnosis not present

## 2021-06-18 DIAGNOSIS — R296 Repeated falls: Secondary | ICD-10-CM | POA: Diagnosis not present

## 2021-06-19 DIAGNOSIS — I2510 Atherosclerotic heart disease of native coronary artery without angina pectoris: Secondary | ICD-10-CM | POA: Diagnosis not present

## 2021-06-19 DIAGNOSIS — F0390 Unspecified dementia without behavioral disturbance: Secondary | ICD-10-CM | POA: Diagnosis not present

## 2021-06-19 DIAGNOSIS — Z8616 Personal history of COVID-19: Secondary | ICD-10-CM | POA: Diagnosis not present

## 2021-06-19 DIAGNOSIS — L22 Diaper dermatitis: Secondary | ICD-10-CM | POA: Diagnosis not present

## 2021-06-19 DIAGNOSIS — R296 Repeated falls: Secondary | ICD-10-CM | POA: Diagnosis not present

## 2021-06-19 DIAGNOSIS — G309 Alzheimer's disease, unspecified: Secondary | ICD-10-CM | POA: Diagnosis not present

## 2021-06-19 DIAGNOSIS — I252 Old myocardial infarction: Secondary | ICD-10-CM | POA: Diagnosis not present

## 2021-06-19 DIAGNOSIS — R52 Pain, unspecified: Secondary | ICD-10-CM | POA: Diagnosis not present

## 2021-06-19 DIAGNOSIS — F02811 Dementia in other diseases classified elsewhere, unspecified severity, with agitation: Secondary | ICD-10-CM | POA: Diagnosis not present

## 2021-06-24 DIAGNOSIS — I2510 Atherosclerotic heart disease of native coronary artery without angina pectoris: Secondary | ICD-10-CM | POA: Diagnosis not present

## 2021-06-24 DIAGNOSIS — I252 Old myocardial infarction: Secondary | ICD-10-CM | POA: Diagnosis not present

## 2021-06-24 DIAGNOSIS — R296 Repeated falls: Secondary | ICD-10-CM | POA: Diagnosis not present

## 2021-06-24 DIAGNOSIS — F02811 Dementia in other diseases classified elsewhere, unspecified severity, with agitation: Secondary | ICD-10-CM | POA: Diagnosis not present

## 2021-06-24 DIAGNOSIS — Z8616 Personal history of COVID-19: Secondary | ICD-10-CM | POA: Diagnosis not present

## 2021-06-24 DIAGNOSIS — G309 Alzheimer's disease, unspecified: Secondary | ICD-10-CM | POA: Diagnosis not present

## 2021-06-25 DIAGNOSIS — Z23 Encounter for immunization: Secondary | ICD-10-CM | POA: Diagnosis not present

## 2021-06-26 DIAGNOSIS — G309 Alzheimer's disease, unspecified: Secondary | ICD-10-CM | POA: Diagnosis not present

## 2021-06-26 DIAGNOSIS — F02811 Dementia in other diseases classified elsewhere, unspecified severity, with agitation: Secondary | ICD-10-CM | POA: Diagnosis not present

## 2021-06-26 DIAGNOSIS — R296 Repeated falls: Secondary | ICD-10-CM | POA: Diagnosis not present

## 2021-06-26 DIAGNOSIS — I2510 Atherosclerotic heart disease of native coronary artery without angina pectoris: Secondary | ICD-10-CM | POA: Diagnosis not present

## 2021-06-26 DIAGNOSIS — Z8616 Personal history of COVID-19: Secondary | ICD-10-CM | POA: Diagnosis not present

## 2021-06-26 DIAGNOSIS — I252 Old myocardial infarction: Secondary | ICD-10-CM | POA: Diagnosis not present

## 2021-07-01 DIAGNOSIS — I2510 Atherosclerotic heart disease of native coronary artery without angina pectoris: Secondary | ICD-10-CM | POA: Diagnosis not present

## 2021-07-01 DIAGNOSIS — I252 Old myocardial infarction: Secondary | ICD-10-CM | POA: Diagnosis not present

## 2021-07-01 DIAGNOSIS — Z8616 Personal history of COVID-19: Secondary | ICD-10-CM | POA: Diagnosis not present

## 2021-07-01 DIAGNOSIS — F02811 Dementia in other diseases classified elsewhere, unspecified severity, with agitation: Secondary | ICD-10-CM | POA: Diagnosis not present

## 2021-07-01 DIAGNOSIS — R296 Repeated falls: Secondary | ICD-10-CM | POA: Diagnosis not present

## 2021-07-01 DIAGNOSIS — G309 Alzheimer's disease, unspecified: Secondary | ICD-10-CM | POA: Diagnosis not present

## 2021-07-02 DIAGNOSIS — N400 Enlarged prostate without lower urinary tract symptoms: Secondary | ICD-10-CM | POA: Diagnosis not present

## 2021-07-02 DIAGNOSIS — Z8679 Personal history of other diseases of the circulatory system: Secondary | ICD-10-CM | POA: Diagnosis not present

## 2021-07-02 DIAGNOSIS — G309 Alzheimer's disease, unspecified: Secondary | ICD-10-CM | POA: Diagnosis not present

## 2021-07-02 DIAGNOSIS — H109 Unspecified conjunctivitis: Secondary | ICD-10-CM | POA: Diagnosis not present

## 2021-07-02 DIAGNOSIS — I252 Old myocardial infarction: Secondary | ICD-10-CM | POA: Diagnosis not present

## 2021-07-02 DIAGNOSIS — M1990 Unspecified osteoarthritis, unspecified site: Secondary | ICD-10-CM | POA: Diagnosis not present

## 2021-07-02 DIAGNOSIS — Z8616 Personal history of COVID-19: Secondary | ICD-10-CM | POA: Diagnosis not present

## 2021-07-02 DIAGNOSIS — F02811 Dementia in other diseases classified elsewhere, unspecified severity, with agitation: Secondary | ICD-10-CM | POA: Diagnosis not present

## 2021-07-02 DIAGNOSIS — L821 Other seborrheic keratosis: Secondary | ICD-10-CM | POA: Diagnosis not present

## 2021-07-02 DIAGNOSIS — I10 Essential (primary) hypertension: Secondary | ICD-10-CM | POA: Diagnosis not present

## 2021-07-02 DIAGNOSIS — R296 Repeated falls: Secondary | ICD-10-CM | POA: Diagnosis not present

## 2021-07-02 DIAGNOSIS — I2510 Atherosclerotic heart disease of native coronary artery without angina pectoris: Secondary | ICD-10-CM | POA: Diagnosis not present

## 2021-07-02 DIAGNOSIS — K219 Gastro-esophageal reflux disease without esophagitis: Secondary | ICD-10-CM | POA: Diagnosis not present

## 2021-07-02 DIAGNOSIS — I255 Ischemic cardiomyopathy: Secondary | ICD-10-CM | POA: Diagnosis not present

## 2021-07-03 DIAGNOSIS — R296 Repeated falls: Secondary | ICD-10-CM | POA: Diagnosis not present

## 2021-07-03 DIAGNOSIS — I2510 Atherosclerotic heart disease of native coronary artery without angina pectoris: Secondary | ICD-10-CM | POA: Diagnosis not present

## 2021-07-03 DIAGNOSIS — G309 Alzheimer's disease, unspecified: Secondary | ICD-10-CM | POA: Diagnosis not present

## 2021-07-03 DIAGNOSIS — I252 Old myocardial infarction: Secondary | ICD-10-CM | POA: Diagnosis not present

## 2021-07-03 DIAGNOSIS — F02811 Dementia in other diseases classified elsewhere, unspecified severity, with agitation: Secondary | ICD-10-CM | POA: Diagnosis not present

## 2021-07-03 DIAGNOSIS — Z8616 Personal history of COVID-19: Secondary | ICD-10-CM | POA: Diagnosis not present

## 2021-07-08 DIAGNOSIS — G309 Alzheimer's disease, unspecified: Secondary | ICD-10-CM | POA: Diagnosis not present

## 2021-07-08 DIAGNOSIS — I2510 Atherosclerotic heart disease of native coronary artery without angina pectoris: Secondary | ICD-10-CM | POA: Diagnosis not present

## 2021-07-08 DIAGNOSIS — I252 Old myocardial infarction: Secondary | ICD-10-CM | POA: Diagnosis not present

## 2021-07-08 DIAGNOSIS — F02811 Dementia in other diseases classified elsewhere, unspecified severity, with agitation: Secondary | ICD-10-CM | POA: Diagnosis not present

## 2021-07-08 DIAGNOSIS — R296 Repeated falls: Secondary | ICD-10-CM | POA: Diagnosis not present

## 2021-07-08 DIAGNOSIS — Z8616 Personal history of COVID-19: Secondary | ICD-10-CM | POA: Diagnosis not present

## 2021-07-10 DIAGNOSIS — R296 Repeated falls: Secondary | ICD-10-CM | POA: Diagnosis not present

## 2021-07-10 DIAGNOSIS — I2510 Atherosclerotic heart disease of native coronary artery without angina pectoris: Secondary | ICD-10-CM | POA: Diagnosis not present

## 2021-07-10 DIAGNOSIS — I252 Old myocardial infarction: Secondary | ICD-10-CM | POA: Diagnosis not present

## 2021-07-10 DIAGNOSIS — Z8616 Personal history of COVID-19: Secondary | ICD-10-CM | POA: Diagnosis not present

## 2021-07-10 DIAGNOSIS — F02811 Dementia in other diseases classified elsewhere, unspecified severity, with agitation: Secondary | ICD-10-CM | POA: Diagnosis not present

## 2021-07-10 DIAGNOSIS — G309 Alzheimer's disease, unspecified: Secondary | ICD-10-CM | POA: Diagnosis not present

## 2021-07-14 DIAGNOSIS — R296 Repeated falls: Secondary | ICD-10-CM | POA: Diagnosis not present

## 2021-07-14 DIAGNOSIS — F02811 Dementia in other diseases classified elsewhere, unspecified severity, with agitation: Secondary | ICD-10-CM | POA: Diagnosis not present

## 2021-07-14 DIAGNOSIS — I2510 Atherosclerotic heart disease of native coronary artery without angina pectoris: Secondary | ICD-10-CM | POA: Diagnosis not present

## 2021-07-14 DIAGNOSIS — G309 Alzheimer's disease, unspecified: Secondary | ICD-10-CM | POA: Diagnosis not present

## 2021-07-14 DIAGNOSIS — I252 Old myocardial infarction: Secondary | ICD-10-CM | POA: Diagnosis not present

## 2021-07-14 DIAGNOSIS — Z8616 Personal history of COVID-19: Secondary | ICD-10-CM | POA: Diagnosis not present

## 2021-07-15 DIAGNOSIS — Z8616 Personal history of COVID-19: Secondary | ICD-10-CM | POA: Diagnosis not present

## 2021-07-15 DIAGNOSIS — G309 Alzheimer's disease, unspecified: Secondary | ICD-10-CM | POA: Diagnosis not present

## 2021-07-15 DIAGNOSIS — R296 Repeated falls: Secondary | ICD-10-CM | POA: Diagnosis not present

## 2021-07-15 DIAGNOSIS — I252 Old myocardial infarction: Secondary | ICD-10-CM | POA: Diagnosis not present

## 2021-07-15 DIAGNOSIS — F02811 Dementia in other diseases classified elsewhere, unspecified severity, with agitation: Secondary | ICD-10-CM | POA: Diagnosis not present

## 2021-07-15 DIAGNOSIS — I2510 Atherosclerotic heart disease of native coronary artery without angina pectoris: Secondary | ICD-10-CM | POA: Diagnosis not present

## 2021-07-17 DIAGNOSIS — Z8616 Personal history of COVID-19: Secondary | ICD-10-CM | POA: Diagnosis not present

## 2021-07-17 DIAGNOSIS — F02811 Dementia in other diseases classified elsewhere, unspecified severity, with agitation: Secondary | ICD-10-CM | POA: Diagnosis not present

## 2021-07-17 DIAGNOSIS — G309 Alzheimer's disease, unspecified: Secondary | ICD-10-CM | POA: Diagnosis not present

## 2021-07-17 DIAGNOSIS — I2510 Atherosclerotic heart disease of native coronary artery without angina pectoris: Secondary | ICD-10-CM | POA: Diagnosis not present

## 2021-07-17 DIAGNOSIS — R296 Repeated falls: Secondary | ICD-10-CM | POA: Diagnosis not present

## 2021-07-17 DIAGNOSIS — I252 Old myocardial infarction: Secondary | ICD-10-CM | POA: Diagnosis not present

## 2021-07-20 DIAGNOSIS — G309 Alzheimer's disease, unspecified: Secondary | ICD-10-CM | POA: Diagnosis not present

## 2021-07-20 DIAGNOSIS — Z8616 Personal history of COVID-19: Secondary | ICD-10-CM | POA: Diagnosis not present

## 2021-07-20 DIAGNOSIS — I2510 Atherosclerotic heart disease of native coronary artery without angina pectoris: Secondary | ICD-10-CM | POA: Diagnosis not present

## 2021-07-20 DIAGNOSIS — R296 Repeated falls: Secondary | ICD-10-CM | POA: Diagnosis not present

## 2021-07-20 DIAGNOSIS — F02811 Dementia in other diseases classified elsewhere, unspecified severity, with agitation: Secondary | ICD-10-CM | POA: Diagnosis not present

## 2021-07-20 DIAGNOSIS — I252 Old myocardial infarction: Secondary | ICD-10-CM | POA: Diagnosis not present

## 2021-07-21 DIAGNOSIS — I2510 Atherosclerotic heart disease of native coronary artery without angina pectoris: Secondary | ICD-10-CM | POA: Diagnosis not present

## 2021-07-21 DIAGNOSIS — F02811 Dementia in other diseases classified elsewhere, unspecified severity, with agitation: Secondary | ICD-10-CM | POA: Diagnosis not present

## 2021-07-21 DIAGNOSIS — R296 Repeated falls: Secondary | ICD-10-CM | POA: Diagnosis not present

## 2021-07-21 DIAGNOSIS — Z8616 Personal history of COVID-19: Secondary | ICD-10-CM | POA: Diagnosis not present

## 2021-07-21 DIAGNOSIS — G309 Alzheimer's disease, unspecified: Secondary | ICD-10-CM | POA: Diagnosis not present

## 2021-07-21 DIAGNOSIS — I252 Old myocardial infarction: Secondary | ICD-10-CM | POA: Diagnosis not present

## 2021-07-24 DIAGNOSIS — R296 Repeated falls: Secondary | ICD-10-CM | POA: Diagnosis not present

## 2021-07-24 DIAGNOSIS — G309 Alzheimer's disease, unspecified: Secondary | ICD-10-CM | POA: Diagnosis not present

## 2021-07-24 DIAGNOSIS — F02811 Dementia in other diseases classified elsewhere, unspecified severity, with agitation: Secondary | ICD-10-CM | POA: Diagnosis not present

## 2021-07-24 DIAGNOSIS — Z8616 Personal history of COVID-19: Secondary | ICD-10-CM | POA: Diagnosis not present

## 2021-07-24 DIAGNOSIS — I252 Old myocardial infarction: Secondary | ICD-10-CM | POA: Diagnosis not present

## 2021-07-24 DIAGNOSIS — I2510 Atherosclerotic heart disease of native coronary artery without angina pectoris: Secondary | ICD-10-CM | POA: Diagnosis not present

## 2021-07-27 DIAGNOSIS — F02811 Dementia in other diseases classified elsewhere, unspecified severity, with agitation: Secondary | ICD-10-CM | POA: Diagnosis not present

## 2021-07-27 DIAGNOSIS — I252 Old myocardial infarction: Secondary | ICD-10-CM | POA: Diagnosis not present

## 2021-07-27 DIAGNOSIS — G309 Alzheimer's disease, unspecified: Secondary | ICD-10-CM | POA: Diagnosis not present

## 2021-07-27 DIAGNOSIS — I2510 Atherosclerotic heart disease of native coronary artery without angina pectoris: Secondary | ICD-10-CM | POA: Diagnosis not present

## 2021-07-27 DIAGNOSIS — Z8616 Personal history of COVID-19: Secondary | ICD-10-CM | POA: Diagnosis not present

## 2021-07-27 DIAGNOSIS — R296 Repeated falls: Secondary | ICD-10-CM | POA: Diagnosis not present

## 2021-07-29 DIAGNOSIS — G309 Alzheimer's disease, unspecified: Secondary | ICD-10-CM | POA: Diagnosis not present

## 2021-07-29 DIAGNOSIS — R296 Repeated falls: Secondary | ICD-10-CM | POA: Diagnosis not present

## 2021-07-29 DIAGNOSIS — F02811 Dementia in other diseases classified elsewhere, unspecified severity, with agitation: Secondary | ICD-10-CM | POA: Diagnosis not present

## 2021-07-29 DIAGNOSIS — I2510 Atherosclerotic heart disease of native coronary artery without angina pectoris: Secondary | ICD-10-CM | POA: Diagnosis not present

## 2021-07-29 DIAGNOSIS — I252 Old myocardial infarction: Secondary | ICD-10-CM | POA: Diagnosis not present

## 2021-07-29 DIAGNOSIS — Z8616 Personal history of COVID-19: Secondary | ICD-10-CM | POA: Diagnosis not present

## 2021-07-31 DIAGNOSIS — Z8616 Personal history of COVID-19: Secondary | ICD-10-CM | POA: Diagnosis not present

## 2021-07-31 DIAGNOSIS — R296 Repeated falls: Secondary | ICD-10-CM | POA: Diagnosis not present

## 2021-07-31 DIAGNOSIS — F02811 Dementia in other diseases classified elsewhere, unspecified severity, with agitation: Secondary | ICD-10-CM | POA: Diagnosis not present

## 2021-07-31 DIAGNOSIS — G309 Alzheimer's disease, unspecified: Secondary | ICD-10-CM | POA: Diagnosis not present

## 2021-07-31 DIAGNOSIS — I2510 Atherosclerotic heart disease of native coronary artery without angina pectoris: Secondary | ICD-10-CM | POA: Diagnosis not present

## 2021-07-31 DIAGNOSIS — I252 Old myocardial infarction: Secondary | ICD-10-CM | POA: Diagnosis not present

## 2021-08-01 DIAGNOSIS — Z8616 Personal history of COVID-19: Secondary | ICD-10-CM | POA: Diagnosis not present

## 2021-08-01 DIAGNOSIS — Z8679 Personal history of other diseases of the circulatory system: Secondary | ICD-10-CM | POA: Diagnosis not present

## 2021-08-01 DIAGNOSIS — G309 Alzheimer's disease, unspecified: Secondary | ICD-10-CM | POA: Diagnosis not present

## 2021-08-01 DIAGNOSIS — F02811 Dementia in other diseases classified elsewhere, unspecified severity, with agitation: Secondary | ICD-10-CM | POA: Diagnosis not present

## 2021-08-01 DIAGNOSIS — N400 Enlarged prostate without lower urinary tract symptoms: Secondary | ICD-10-CM | POA: Diagnosis not present

## 2021-08-01 DIAGNOSIS — I252 Old myocardial infarction: Secondary | ICD-10-CM | POA: Diagnosis not present

## 2021-08-01 DIAGNOSIS — M1990 Unspecified osteoarthritis, unspecified site: Secondary | ICD-10-CM | POA: Diagnosis not present

## 2021-08-01 DIAGNOSIS — H109 Unspecified conjunctivitis: Secondary | ICD-10-CM | POA: Diagnosis not present

## 2021-08-01 DIAGNOSIS — I255 Ischemic cardiomyopathy: Secondary | ICD-10-CM | POA: Diagnosis not present

## 2021-08-01 DIAGNOSIS — R296 Repeated falls: Secondary | ICD-10-CM | POA: Diagnosis not present

## 2021-08-01 DIAGNOSIS — L821 Other seborrheic keratosis: Secondary | ICD-10-CM | POA: Diagnosis not present

## 2021-08-01 DIAGNOSIS — I10 Essential (primary) hypertension: Secondary | ICD-10-CM | POA: Diagnosis not present

## 2021-08-01 DIAGNOSIS — K219 Gastro-esophageal reflux disease without esophagitis: Secondary | ICD-10-CM | POA: Diagnosis not present

## 2021-08-01 DIAGNOSIS — I2510 Atherosclerotic heart disease of native coronary artery without angina pectoris: Secondary | ICD-10-CM | POA: Diagnosis not present

## 2021-08-03 DIAGNOSIS — R296 Repeated falls: Secondary | ICD-10-CM | POA: Diagnosis not present

## 2021-08-03 DIAGNOSIS — I252 Old myocardial infarction: Secondary | ICD-10-CM | POA: Diagnosis not present

## 2021-08-03 DIAGNOSIS — Z8616 Personal history of COVID-19: Secondary | ICD-10-CM | POA: Diagnosis not present

## 2021-08-03 DIAGNOSIS — I2510 Atherosclerotic heart disease of native coronary artery without angina pectoris: Secondary | ICD-10-CM | POA: Diagnosis not present

## 2021-08-03 DIAGNOSIS — F02811 Dementia in other diseases classified elsewhere, unspecified severity, with agitation: Secondary | ICD-10-CM | POA: Diagnosis not present

## 2021-08-03 DIAGNOSIS — G309 Alzheimer's disease, unspecified: Secondary | ICD-10-CM | POA: Diagnosis not present

## 2021-08-06 DIAGNOSIS — F0390 Unspecified dementia without behavioral disturbance: Secondary | ICD-10-CM | POA: Diagnosis not present

## 2021-08-06 DIAGNOSIS — R627 Adult failure to thrive: Secondary | ICD-10-CM | POA: Diagnosis not present

## 2021-08-06 DIAGNOSIS — K219 Gastro-esophageal reflux disease without esophagitis: Secondary | ICD-10-CM | POA: Diagnosis not present

## 2021-08-06 DIAGNOSIS — N400 Enlarged prostate without lower urinary tract symptoms: Secondary | ICD-10-CM | POA: Diagnosis not present

## 2021-08-06 DIAGNOSIS — S0001XA Abrasion of scalp, initial encounter: Secondary | ICD-10-CM | POA: Diagnosis not present

## 2021-08-06 DIAGNOSIS — R52 Pain, unspecified: Secondary | ICD-10-CM | POA: Diagnosis not present

## 2021-08-06 DIAGNOSIS — Z6823 Body mass index (BMI) 23.0-23.9, adult: Secondary | ICD-10-CM | POA: Diagnosis not present

## 2021-08-06 DIAGNOSIS — K5900 Constipation, unspecified: Secondary | ICD-10-CM | POA: Diagnosis not present

## 2021-08-07 DIAGNOSIS — G309 Alzheimer's disease, unspecified: Secondary | ICD-10-CM | POA: Diagnosis not present

## 2021-08-07 DIAGNOSIS — Z8616 Personal history of COVID-19: Secondary | ICD-10-CM | POA: Diagnosis not present

## 2021-08-07 DIAGNOSIS — F02811 Dementia in other diseases classified elsewhere, unspecified severity, with agitation: Secondary | ICD-10-CM | POA: Diagnosis not present

## 2021-08-07 DIAGNOSIS — I252 Old myocardial infarction: Secondary | ICD-10-CM | POA: Diagnosis not present

## 2021-08-07 DIAGNOSIS — R296 Repeated falls: Secondary | ICD-10-CM | POA: Diagnosis not present

## 2021-08-07 DIAGNOSIS — I2510 Atherosclerotic heart disease of native coronary artery without angina pectoris: Secondary | ICD-10-CM | POA: Diagnosis not present

## 2021-08-10 DIAGNOSIS — I2510 Atherosclerotic heart disease of native coronary artery without angina pectoris: Secondary | ICD-10-CM | POA: Diagnosis not present

## 2021-08-10 DIAGNOSIS — I252 Old myocardial infarction: Secondary | ICD-10-CM | POA: Diagnosis not present

## 2021-08-10 DIAGNOSIS — Z8616 Personal history of COVID-19: Secondary | ICD-10-CM | POA: Diagnosis not present

## 2021-08-10 DIAGNOSIS — R296 Repeated falls: Secondary | ICD-10-CM | POA: Diagnosis not present

## 2021-08-10 DIAGNOSIS — F02811 Dementia in other diseases classified elsewhere, unspecified severity, with agitation: Secondary | ICD-10-CM | POA: Diagnosis not present

## 2021-08-10 DIAGNOSIS — G309 Alzheimer's disease, unspecified: Secondary | ICD-10-CM | POA: Diagnosis not present

## 2021-08-12 DIAGNOSIS — R296 Repeated falls: Secondary | ICD-10-CM | POA: Diagnosis not present

## 2021-08-12 DIAGNOSIS — F02811 Dementia in other diseases classified elsewhere, unspecified severity, with agitation: Secondary | ICD-10-CM | POA: Diagnosis not present

## 2021-08-12 DIAGNOSIS — Z8616 Personal history of COVID-19: Secondary | ICD-10-CM | POA: Diagnosis not present

## 2021-08-12 DIAGNOSIS — I252 Old myocardial infarction: Secondary | ICD-10-CM | POA: Diagnosis not present

## 2021-08-12 DIAGNOSIS — G309 Alzheimer's disease, unspecified: Secondary | ICD-10-CM | POA: Diagnosis not present

## 2021-08-12 DIAGNOSIS — I2510 Atherosclerotic heart disease of native coronary artery without angina pectoris: Secondary | ICD-10-CM | POA: Diagnosis not present

## 2021-08-13 DIAGNOSIS — G309 Alzheimer's disease, unspecified: Secondary | ICD-10-CM | POA: Diagnosis not present

## 2021-08-13 DIAGNOSIS — I2510 Atherosclerotic heart disease of native coronary artery without angina pectoris: Secondary | ICD-10-CM | POA: Diagnosis not present

## 2021-08-13 DIAGNOSIS — Z8616 Personal history of COVID-19: Secondary | ICD-10-CM | POA: Diagnosis not present

## 2021-08-13 DIAGNOSIS — F02811 Dementia in other diseases classified elsewhere, unspecified severity, with agitation: Secondary | ICD-10-CM | POA: Diagnosis not present

## 2021-08-13 DIAGNOSIS — R296 Repeated falls: Secondary | ICD-10-CM | POA: Diagnosis not present

## 2021-08-13 DIAGNOSIS — I252 Old myocardial infarction: Secondary | ICD-10-CM | POA: Diagnosis not present

## 2021-08-14 DIAGNOSIS — I252 Old myocardial infarction: Secondary | ICD-10-CM | POA: Diagnosis not present

## 2021-08-14 DIAGNOSIS — I2510 Atherosclerotic heart disease of native coronary artery without angina pectoris: Secondary | ICD-10-CM | POA: Diagnosis not present

## 2021-08-14 DIAGNOSIS — F02811 Dementia in other diseases classified elsewhere, unspecified severity, with agitation: Secondary | ICD-10-CM | POA: Diagnosis not present

## 2021-08-14 DIAGNOSIS — R296 Repeated falls: Secondary | ICD-10-CM | POA: Diagnosis not present

## 2021-08-14 DIAGNOSIS — Z8616 Personal history of COVID-19: Secondary | ICD-10-CM | POA: Diagnosis not present

## 2021-08-14 DIAGNOSIS — G309 Alzheimer's disease, unspecified: Secondary | ICD-10-CM | POA: Diagnosis not present

## 2021-08-17 DIAGNOSIS — I252 Old myocardial infarction: Secondary | ICD-10-CM | POA: Diagnosis not present

## 2021-08-17 DIAGNOSIS — F02811 Dementia in other diseases classified elsewhere, unspecified severity, with agitation: Secondary | ICD-10-CM | POA: Diagnosis not present

## 2021-08-17 DIAGNOSIS — Z8616 Personal history of COVID-19: Secondary | ICD-10-CM | POA: Diagnosis not present

## 2021-08-17 DIAGNOSIS — R296 Repeated falls: Secondary | ICD-10-CM | POA: Diagnosis not present

## 2021-08-17 DIAGNOSIS — I2510 Atherosclerotic heart disease of native coronary artery without angina pectoris: Secondary | ICD-10-CM | POA: Diagnosis not present

## 2021-08-17 DIAGNOSIS — G309 Alzheimer's disease, unspecified: Secondary | ICD-10-CM | POA: Diagnosis not present

## 2021-08-21 DIAGNOSIS — R296 Repeated falls: Secondary | ICD-10-CM | POA: Diagnosis not present

## 2021-08-21 DIAGNOSIS — F02811 Dementia in other diseases classified elsewhere, unspecified severity, with agitation: Secondary | ICD-10-CM | POA: Diagnosis not present

## 2021-08-21 DIAGNOSIS — I10 Essential (primary) hypertension: Secondary | ICD-10-CM | POA: Diagnosis not present

## 2021-08-21 DIAGNOSIS — F0150 Vascular dementia without behavioral disturbance: Secondary | ICD-10-CM | POA: Diagnosis not present

## 2021-08-21 DIAGNOSIS — Z8616 Personal history of COVID-19: Secondary | ICD-10-CM | POA: Diagnosis not present

## 2021-08-21 DIAGNOSIS — E569 Vitamin deficiency, unspecified: Secondary | ICD-10-CM | POA: Diagnosis not present

## 2021-08-21 DIAGNOSIS — G309 Alzheimer's disease, unspecified: Secondary | ICD-10-CM | POA: Diagnosis not present

## 2021-08-21 DIAGNOSIS — L821 Other seborrheic keratosis: Secondary | ICD-10-CM | POA: Diagnosis not present

## 2021-08-21 DIAGNOSIS — I2510 Atherosclerotic heart disease of native coronary artery without angina pectoris: Secondary | ICD-10-CM | POA: Diagnosis not present

## 2021-08-21 DIAGNOSIS — I252 Old myocardial infarction: Secondary | ICD-10-CM | POA: Diagnosis not present

## 2021-08-24 DIAGNOSIS — R296 Repeated falls: Secondary | ICD-10-CM | POA: Diagnosis not present

## 2021-08-24 DIAGNOSIS — I252 Old myocardial infarction: Secondary | ICD-10-CM | POA: Diagnosis not present

## 2021-08-24 DIAGNOSIS — I2510 Atherosclerotic heart disease of native coronary artery without angina pectoris: Secondary | ICD-10-CM | POA: Diagnosis not present

## 2021-08-24 DIAGNOSIS — F02811 Dementia in other diseases classified elsewhere, unspecified severity, with agitation: Secondary | ICD-10-CM | POA: Diagnosis not present

## 2021-08-24 DIAGNOSIS — G309 Alzheimer's disease, unspecified: Secondary | ICD-10-CM | POA: Diagnosis not present

## 2021-08-24 DIAGNOSIS — Z8616 Personal history of COVID-19: Secondary | ICD-10-CM | POA: Diagnosis not present

## 2021-08-26 DIAGNOSIS — I252 Old myocardial infarction: Secondary | ICD-10-CM | POA: Diagnosis not present

## 2021-08-26 DIAGNOSIS — Z8616 Personal history of COVID-19: Secondary | ICD-10-CM | POA: Diagnosis not present

## 2021-08-26 DIAGNOSIS — R296 Repeated falls: Secondary | ICD-10-CM | POA: Diagnosis not present

## 2021-08-26 DIAGNOSIS — I2510 Atherosclerotic heart disease of native coronary artery without angina pectoris: Secondary | ICD-10-CM | POA: Diagnosis not present

## 2021-08-26 DIAGNOSIS — G309 Alzheimer's disease, unspecified: Secondary | ICD-10-CM | POA: Diagnosis not present

## 2021-08-26 DIAGNOSIS — F02811 Dementia in other diseases classified elsewhere, unspecified severity, with agitation: Secondary | ICD-10-CM | POA: Diagnosis not present

## 2021-08-27 DIAGNOSIS — F02811 Dementia in other diseases classified elsewhere, unspecified severity, with agitation: Secondary | ICD-10-CM | POA: Diagnosis not present

## 2021-08-27 DIAGNOSIS — R296 Repeated falls: Secondary | ICD-10-CM | POA: Diagnosis not present

## 2021-08-27 DIAGNOSIS — Z8616 Personal history of COVID-19: Secondary | ICD-10-CM | POA: Diagnosis not present

## 2021-08-27 DIAGNOSIS — G309 Alzheimer's disease, unspecified: Secondary | ICD-10-CM | POA: Diagnosis not present

## 2021-08-27 DIAGNOSIS — I2510 Atherosclerotic heart disease of native coronary artery without angina pectoris: Secondary | ICD-10-CM | POA: Diagnosis not present

## 2021-08-27 DIAGNOSIS — I252 Old myocardial infarction: Secondary | ICD-10-CM | POA: Diagnosis not present

## 2021-08-28 DIAGNOSIS — I2510 Atherosclerotic heart disease of native coronary artery without angina pectoris: Secondary | ICD-10-CM | POA: Diagnosis not present

## 2021-08-28 DIAGNOSIS — G309 Alzheimer's disease, unspecified: Secondary | ICD-10-CM | POA: Diagnosis not present

## 2021-08-28 DIAGNOSIS — I252 Old myocardial infarction: Secondary | ICD-10-CM | POA: Diagnosis not present

## 2021-08-28 DIAGNOSIS — F02811 Dementia in other diseases classified elsewhere, unspecified severity, with agitation: Secondary | ICD-10-CM | POA: Diagnosis not present

## 2021-08-28 DIAGNOSIS — Z8616 Personal history of COVID-19: Secondary | ICD-10-CM | POA: Diagnosis not present

## 2021-08-28 DIAGNOSIS — R296 Repeated falls: Secondary | ICD-10-CM | POA: Diagnosis not present

## 2021-08-31 DIAGNOSIS — R296 Repeated falls: Secondary | ICD-10-CM | POA: Diagnosis not present

## 2021-08-31 DIAGNOSIS — G309 Alzheimer's disease, unspecified: Secondary | ICD-10-CM | POA: Diagnosis not present

## 2021-08-31 DIAGNOSIS — Z8616 Personal history of COVID-19: Secondary | ICD-10-CM | POA: Diagnosis not present

## 2021-08-31 DIAGNOSIS — W19XXXA Unspecified fall, initial encounter: Secondary | ICD-10-CM | POA: Diagnosis not present

## 2021-08-31 DIAGNOSIS — R52 Pain, unspecified: Secondary | ICD-10-CM | POA: Diagnosis not present

## 2021-08-31 DIAGNOSIS — R2689 Other abnormalities of gait and mobility: Secondary | ICD-10-CM | POA: Diagnosis not present

## 2021-08-31 DIAGNOSIS — Z9181 History of falling: Secondary | ICD-10-CM | POA: Diagnosis not present

## 2021-08-31 DIAGNOSIS — M6281 Muscle weakness (generalized): Secondary | ICD-10-CM | POA: Diagnosis not present

## 2021-08-31 DIAGNOSIS — F0390 Unspecified dementia without behavioral disturbance: Secondary | ICD-10-CM | POA: Diagnosis not present

## 2021-08-31 DIAGNOSIS — F02811 Dementia in other diseases classified elsewhere, unspecified severity, with agitation: Secondary | ICD-10-CM | POA: Diagnosis not present

## 2021-08-31 DIAGNOSIS — I252 Old myocardial infarction: Secondary | ICD-10-CM | POA: Diagnosis not present

## 2021-08-31 DIAGNOSIS — Z7689 Persons encountering health services in other specified circumstances: Secondary | ICD-10-CM | POA: Diagnosis not present

## 2021-08-31 DIAGNOSIS — I2510 Atherosclerotic heart disease of native coronary artery without angina pectoris: Secondary | ICD-10-CM | POA: Diagnosis not present

## 2021-09-01 DIAGNOSIS — I10 Essential (primary) hypertension: Secondary | ICD-10-CM | POA: Diagnosis not present

## 2021-09-01 DIAGNOSIS — R296 Repeated falls: Secondary | ICD-10-CM | POA: Diagnosis not present

## 2021-09-01 DIAGNOSIS — I2510 Atherosclerotic heart disease of native coronary artery without angina pectoris: Secondary | ICD-10-CM | POA: Diagnosis not present

## 2021-09-01 DIAGNOSIS — G309 Alzheimer's disease, unspecified: Secondary | ICD-10-CM | POA: Diagnosis not present

## 2021-09-01 DIAGNOSIS — F02811 Dementia in other diseases classified elsewhere, unspecified severity, with agitation: Secondary | ICD-10-CM | POA: Diagnosis not present

## 2021-09-01 DIAGNOSIS — M1990 Unspecified osteoarthritis, unspecified site: Secondary | ICD-10-CM | POA: Diagnosis not present

## 2021-09-01 DIAGNOSIS — I255 Ischemic cardiomyopathy: Secondary | ICD-10-CM | POA: Diagnosis not present

## 2021-09-01 DIAGNOSIS — L821 Other seborrheic keratosis: Secondary | ICD-10-CM | POA: Diagnosis not present

## 2021-09-01 DIAGNOSIS — N400 Enlarged prostate without lower urinary tract symptoms: Secondary | ICD-10-CM | POA: Diagnosis not present

## 2021-09-01 DIAGNOSIS — I252 Old myocardial infarction: Secondary | ICD-10-CM | POA: Diagnosis not present

## 2021-09-01 DIAGNOSIS — Z8679 Personal history of other diseases of the circulatory system: Secondary | ICD-10-CM | POA: Diagnosis not present

## 2021-09-01 DIAGNOSIS — K219 Gastro-esophageal reflux disease without esophagitis: Secondary | ICD-10-CM | POA: Diagnosis not present

## 2021-09-01 DIAGNOSIS — H109 Unspecified conjunctivitis: Secondary | ICD-10-CM | POA: Diagnosis not present

## 2021-09-01 DIAGNOSIS — Z8616 Personal history of COVID-19: Secondary | ICD-10-CM | POA: Diagnosis not present

## 2021-09-04 DIAGNOSIS — I252 Old myocardial infarction: Secondary | ICD-10-CM | POA: Diagnosis not present

## 2021-09-04 DIAGNOSIS — Z8616 Personal history of COVID-19: Secondary | ICD-10-CM | POA: Diagnosis not present

## 2021-09-04 DIAGNOSIS — R296 Repeated falls: Secondary | ICD-10-CM | POA: Diagnosis not present

## 2021-09-04 DIAGNOSIS — F02811 Dementia in other diseases classified elsewhere, unspecified severity, with agitation: Secondary | ICD-10-CM | POA: Diagnosis not present

## 2021-09-04 DIAGNOSIS — G309 Alzheimer's disease, unspecified: Secondary | ICD-10-CM | POA: Diagnosis not present

## 2021-09-04 DIAGNOSIS — I2510 Atherosclerotic heart disease of native coronary artery without angina pectoris: Secondary | ICD-10-CM | POA: Diagnosis not present

## 2021-09-07 DIAGNOSIS — F02811 Dementia in other diseases classified elsewhere, unspecified severity, with agitation: Secondary | ICD-10-CM | POA: Diagnosis not present

## 2021-09-07 DIAGNOSIS — I252 Old myocardial infarction: Secondary | ICD-10-CM | POA: Diagnosis not present

## 2021-09-07 DIAGNOSIS — Z8616 Personal history of COVID-19: Secondary | ICD-10-CM | POA: Diagnosis not present

## 2021-09-07 DIAGNOSIS — G309 Alzheimer's disease, unspecified: Secondary | ICD-10-CM | POA: Diagnosis not present

## 2021-09-07 DIAGNOSIS — I2510 Atherosclerotic heart disease of native coronary artery without angina pectoris: Secondary | ICD-10-CM | POA: Diagnosis not present

## 2021-09-07 DIAGNOSIS — R296 Repeated falls: Secondary | ICD-10-CM | POA: Diagnosis not present

## 2021-09-08 DIAGNOSIS — F0390 Unspecified dementia without behavioral disturbance: Secondary | ICD-10-CM | POA: Diagnosis not present

## 2021-09-08 DIAGNOSIS — R627 Adult failure to thrive: Secondary | ICD-10-CM | POA: Diagnosis not present

## 2021-09-08 DIAGNOSIS — S0001XA Abrasion of scalp, initial encounter: Secondary | ICD-10-CM | POA: Diagnosis not present

## 2021-09-09 DIAGNOSIS — I2510 Atherosclerotic heart disease of native coronary artery without angina pectoris: Secondary | ICD-10-CM | POA: Diagnosis not present

## 2021-09-09 DIAGNOSIS — R296 Repeated falls: Secondary | ICD-10-CM | POA: Diagnosis not present

## 2021-09-09 DIAGNOSIS — I252 Old myocardial infarction: Secondary | ICD-10-CM | POA: Diagnosis not present

## 2021-09-09 DIAGNOSIS — G309 Alzheimer's disease, unspecified: Secondary | ICD-10-CM | POA: Diagnosis not present

## 2021-09-09 DIAGNOSIS — F02811 Dementia in other diseases classified elsewhere, unspecified severity, with agitation: Secondary | ICD-10-CM | POA: Diagnosis not present

## 2021-09-09 DIAGNOSIS — Z8616 Personal history of COVID-19: Secondary | ICD-10-CM | POA: Diagnosis not present

## 2021-09-10 DIAGNOSIS — E569 Vitamin deficiency, unspecified: Secondary | ICD-10-CM | POA: Diagnosis not present

## 2021-09-10 DIAGNOSIS — I10 Essential (primary) hypertension: Secondary | ICD-10-CM | POA: Diagnosis not present

## 2021-09-10 DIAGNOSIS — L821 Other seborrheic keratosis: Secondary | ICD-10-CM | POA: Diagnosis not present

## 2021-09-10 DIAGNOSIS — F0150 Vascular dementia without behavioral disturbance: Secondary | ICD-10-CM | POA: Diagnosis not present

## 2021-09-11 DIAGNOSIS — I252 Old myocardial infarction: Secondary | ICD-10-CM | POA: Diagnosis not present

## 2021-09-11 DIAGNOSIS — I2510 Atherosclerotic heart disease of native coronary artery without angina pectoris: Secondary | ICD-10-CM | POA: Diagnosis not present

## 2021-09-11 DIAGNOSIS — G309 Alzheimer's disease, unspecified: Secondary | ICD-10-CM | POA: Diagnosis not present

## 2021-09-11 DIAGNOSIS — R296 Repeated falls: Secondary | ICD-10-CM | POA: Diagnosis not present

## 2021-09-11 DIAGNOSIS — F02811 Dementia in other diseases classified elsewhere, unspecified severity, with agitation: Secondary | ICD-10-CM | POA: Diagnosis not present

## 2021-09-11 DIAGNOSIS — Z8616 Personal history of COVID-19: Secondary | ICD-10-CM | POA: Diagnosis not present

## 2021-09-14 DIAGNOSIS — I252 Old myocardial infarction: Secondary | ICD-10-CM | POA: Diagnosis not present

## 2021-09-14 DIAGNOSIS — F02811 Dementia in other diseases classified elsewhere, unspecified severity, with agitation: Secondary | ICD-10-CM | POA: Diagnosis not present

## 2021-09-14 DIAGNOSIS — G309 Alzheimer's disease, unspecified: Secondary | ICD-10-CM | POA: Diagnosis not present

## 2021-09-14 DIAGNOSIS — I2510 Atherosclerotic heart disease of native coronary artery without angina pectoris: Secondary | ICD-10-CM | POA: Diagnosis not present

## 2021-09-14 DIAGNOSIS — Z8616 Personal history of COVID-19: Secondary | ICD-10-CM | POA: Diagnosis not present

## 2021-09-14 DIAGNOSIS — R296 Repeated falls: Secondary | ICD-10-CM | POA: Diagnosis not present

## 2021-09-16 DIAGNOSIS — G309 Alzheimer's disease, unspecified: Secondary | ICD-10-CM | POA: Diagnosis not present

## 2021-09-16 DIAGNOSIS — Z8616 Personal history of COVID-19: Secondary | ICD-10-CM | POA: Diagnosis not present

## 2021-09-16 DIAGNOSIS — I2510 Atherosclerotic heart disease of native coronary artery without angina pectoris: Secondary | ICD-10-CM | POA: Diagnosis not present

## 2021-09-16 DIAGNOSIS — F02811 Dementia in other diseases classified elsewhere, unspecified severity, with agitation: Secondary | ICD-10-CM | POA: Diagnosis not present

## 2021-09-16 DIAGNOSIS — I252 Old myocardial infarction: Secondary | ICD-10-CM | POA: Diagnosis not present

## 2021-09-16 DIAGNOSIS — R296 Repeated falls: Secondary | ICD-10-CM | POA: Diagnosis not present

## 2021-09-18 DIAGNOSIS — Z8616 Personal history of COVID-19: Secondary | ICD-10-CM | POA: Diagnosis not present

## 2021-09-18 DIAGNOSIS — G309 Alzheimer's disease, unspecified: Secondary | ICD-10-CM | POA: Diagnosis not present

## 2021-09-18 DIAGNOSIS — I2510 Atherosclerotic heart disease of native coronary artery without angina pectoris: Secondary | ICD-10-CM | POA: Diagnosis not present

## 2021-09-18 DIAGNOSIS — R296 Repeated falls: Secondary | ICD-10-CM | POA: Diagnosis not present

## 2021-09-18 DIAGNOSIS — E569 Vitamin deficiency, unspecified: Secondary | ICD-10-CM | POA: Diagnosis not present

## 2021-09-18 DIAGNOSIS — L821 Other seborrheic keratosis: Secondary | ICD-10-CM | POA: Diagnosis not present

## 2021-09-18 DIAGNOSIS — F02811 Dementia in other diseases classified elsewhere, unspecified severity, with agitation: Secondary | ICD-10-CM | POA: Diagnosis not present

## 2021-09-18 DIAGNOSIS — S81812A Laceration without foreign body, left lower leg, initial encounter: Secondary | ICD-10-CM | POA: Diagnosis not present

## 2021-09-18 DIAGNOSIS — S0001XA Abrasion of scalp, initial encounter: Secondary | ICD-10-CM | POA: Diagnosis not present

## 2021-09-18 DIAGNOSIS — I252 Old myocardial infarction: Secondary | ICD-10-CM | POA: Diagnosis not present

## 2021-09-21 DIAGNOSIS — F02811 Dementia in other diseases classified elsewhere, unspecified severity, with agitation: Secondary | ICD-10-CM | POA: Diagnosis not present

## 2021-09-21 DIAGNOSIS — I252 Old myocardial infarction: Secondary | ICD-10-CM | POA: Diagnosis not present

## 2021-09-21 DIAGNOSIS — I2510 Atherosclerotic heart disease of native coronary artery without angina pectoris: Secondary | ICD-10-CM | POA: Diagnosis not present

## 2021-09-21 DIAGNOSIS — G309 Alzheimer's disease, unspecified: Secondary | ICD-10-CM | POA: Diagnosis not present

## 2021-09-21 DIAGNOSIS — R296 Repeated falls: Secondary | ICD-10-CM | POA: Diagnosis not present

## 2021-09-21 DIAGNOSIS — Z8616 Personal history of COVID-19: Secondary | ICD-10-CM | POA: Diagnosis not present

## 2021-09-22 DIAGNOSIS — E569 Vitamin deficiency, unspecified: Secondary | ICD-10-CM | POA: Diagnosis not present

## 2021-09-22 DIAGNOSIS — S81812D Laceration without foreign body, left lower leg, subsequent encounter: Secondary | ICD-10-CM | POA: Diagnosis not present

## 2021-09-22 DIAGNOSIS — L821 Other seborrheic keratosis: Secondary | ICD-10-CM | POA: Diagnosis not present

## 2021-09-22 DIAGNOSIS — F0150 Vascular dementia without behavioral disturbance: Secondary | ICD-10-CM | POA: Diagnosis not present

## 2021-09-23 DIAGNOSIS — R296 Repeated falls: Secondary | ICD-10-CM | POA: Diagnosis not present

## 2021-09-23 DIAGNOSIS — G309 Alzheimer's disease, unspecified: Secondary | ICD-10-CM | POA: Diagnosis not present

## 2021-09-23 DIAGNOSIS — F02811 Dementia in other diseases classified elsewhere, unspecified severity, with agitation: Secondary | ICD-10-CM | POA: Diagnosis not present

## 2021-09-23 DIAGNOSIS — I2510 Atherosclerotic heart disease of native coronary artery without angina pectoris: Secondary | ICD-10-CM | POA: Diagnosis not present

## 2021-09-23 DIAGNOSIS — Z8616 Personal history of COVID-19: Secondary | ICD-10-CM | POA: Diagnosis not present

## 2021-09-23 DIAGNOSIS — I252 Old myocardial infarction: Secondary | ICD-10-CM | POA: Diagnosis not present

## 2021-09-24 DIAGNOSIS — Z8616 Personal history of COVID-19: Secondary | ICD-10-CM | POA: Diagnosis not present

## 2021-09-24 DIAGNOSIS — R296 Repeated falls: Secondary | ICD-10-CM | POA: Diagnosis not present

## 2021-09-24 DIAGNOSIS — I252 Old myocardial infarction: Secondary | ICD-10-CM | POA: Diagnosis not present

## 2021-09-24 DIAGNOSIS — I2510 Atherosclerotic heart disease of native coronary artery without angina pectoris: Secondary | ICD-10-CM | POA: Diagnosis not present

## 2021-09-24 DIAGNOSIS — G309 Alzheimer's disease, unspecified: Secondary | ICD-10-CM | POA: Diagnosis not present

## 2021-09-24 DIAGNOSIS — F02811 Dementia in other diseases classified elsewhere, unspecified severity, with agitation: Secondary | ICD-10-CM | POA: Diagnosis not present

## 2021-09-25 DIAGNOSIS — R296 Repeated falls: Secondary | ICD-10-CM | POA: Diagnosis not present

## 2021-09-25 DIAGNOSIS — F02811 Dementia in other diseases classified elsewhere, unspecified severity, with agitation: Secondary | ICD-10-CM | POA: Diagnosis not present

## 2021-09-25 DIAGNOSIS — I252 Old myocardial infarction: Secondary | ICD-10-CM | POA: Diagnosis not present

## 2021-09-25 DIAGNOSIS — G309 Alzheimer's disease, unspecified: Secondary | ICD-10-CM | POA: Diagnosis not present

## 2021-09-25 DIAGNOSIS — Z8616 Personal history of COVID-19: Secondary | ICD-10-CM | POA: Diagnosis not present

## 2021-09-25 DIAGNOSIS — I2510 Atherosclerotic heart disease of native coronary artery without angina pectoris: Secondary | ICD-10-CM | POA: Diagnosis not present

## 2021-09-28 DIAGNOSIS — Z8616 Personal history of COVID-19: Secondary | ICD-10-CM | POA: Diagnosis not present

## 2021-09-28 DIAGNOSIS — I2510 Atherosclerotic heart disease of native coronary artery without angina pectoris: Secondary | ICD-10-CM | POA: Diagnosis not present

## 2021-09-28 DIAGNOSIS — I252 Old myocardial infarction: Secondary | ICD-10-CM | POA: Diagnosis not present

## 2021-09-28 DIAGNOSIS — R296 Repeated falls: Secondary | ICD-10-CM | POA: Diagnosis not present

## 2021-09-28 DIAGNOSIS — F02811 Dementia in other diseases classified elsewhere, unspecified severity, with agitation: Secondary | ICD-10-CM | POA: Diagnosis not present

## 2021-09-28 DIAGNOSIS — G309 Alzheimer's disease, unspecified: Secondary | ICD-10-CM | POA: Diagnosis not present

## 2021-09-30 DIAGNOSIS — L22 Diaper dermatitis: Secondary | ICD-10-CM | POA: Diagnosis not present

## 2021-09-30 DIAGNOSIS — E569 Vitamin deficiency, unspecified: Secondary | ICD-10-CM | POA: Diagnosis not present

## 2021-09-30 DIAGNOSIS — L821 Other seborrheic keratosis: Secondary | ICD-10-CM | POA: Diagnosis not present

## 2021-09-30 DIAGNOSIS — S81812D Laceration without foreign body, left lower leg, subsequent encounter: Secondary | ICD-10-CM | POA: Diagnosis not present

## 2021-10-02 DIAGNOSIS — I255 Ischemic cardiomyopathy: Secondary | ICD-10-CM | POA: Diagnosis not present

## 2021-10-02 DIAGNOSIS — N400 Enlarged prostate without lower urinary tract symptoms: Secondary | ICD-10-CM | POA: Diagnosis not present

## 2021-10-02 DIAGNOSIS — I2510 Atherosclerotic heart disease of native coronary artery without angina pectoris: Secondary | ICD-10-CM | POA: Diagnosis not present

## 2021-10-02 DIAGNOSIS — L821 Other seborrheic keratosis: Secondary | ICD-10-CM | POA: Diagnosis not present

## 2021-10-02 DIAGNOSIS — G309 Alzheimer's disease, unspecified: Secondary | ICD-10-CM | POA: Diagnosis not present

## 2021-10-02 DIAGNOSIS — K219 Gastro-esophageal reflux disease without esophagitis: Secondary | ICD-10-CM | POA: Diagnosis not present

## 2021-10-02 DIAGNOSIS — M1990 Unspecified osteoarthritis, unspecified site: Secondary | ICD-10-CM | POA: Diagnosis not present

## 2021-10-02 DIAGNOSIS — F02811 Dementia in other diseases classified elsewhere, unspecified severity, with agitation: Secondary | ICD-10-CM | POA: Diagnosis not present

## 2021-10-02 DIAGNOSIS — Z8679 Personal history of other diseases of the circulatory system: Secondary | ICD-10-CM | POA: Diagnosis not present

## 2021-10-02 DIAGNOSIS — I252 Old myocardial infarction: Secondary | ICD-10-CM | POA: Diagnosis not present

## 2021-10-02 DIAGNOSIS — Z8616 Personal history of COVID-19: Secondary | ICD-10-CM | POA: Diagnosis not present

## 2021-10-02 DIAGNOSIS — H109 Unspecified conjunctivitis: Secondary | ICD-10-CM | POA: Diagnosis not present

## 2021-10-02 DIAGNOSIS — R296 Repeated falls: Secondary | ICD-10-CM | POA: Diagnosis not present

## 2021-10-02 DIAGNOSIS — I10 Essential (primary) hypertension: Secondary | ICD-10-CM | POA: Diagnosis not present

## 2021-10-05 DIAGNOSIS — I2510 Atherosclerotic heart disease of native coronary artery without angina pectoris: Secondary | ICD-10-CM | POA: Diagnosis not present

## 2021-10-05 DIAGNOSIS — R296 Repeated falls: Secondary | ICD-10-CM | POA: Diagnosis not present

## 2021-10-05 DIAGNOSIS — Z8616 Personal history of COVID-19: Secondary | ICD-10-CM | POA: Diagnosis not present

## 2021-10-05 DIAGNOSIS — F02811 Dementia in other diseases classified elsewhere, unspecified severity, with agitation: Secondary | ICD-10-CM | POA: Diagnosis not present

## 2021-10-05 DIAGNOSIS — G309 Alzheimer's disease, unspecified: Secondary | ICD-10-CM | POA: Diagnosis not present

## 2021-10-05 DIAGNOSIS — I252 Old myocardial infarction: Secondary | ICD-10-CM | POA: Diagnosis not present

## 2021-10-06 DIAGNOSIS — R296 Repeated falls: Secondary | ICD-10-CM | POA: Diagnosis not present

## 2021-10-06 DIAGNOSIS — G309 Alzheimer's disease, unspecified: Secondary | ICD-10-CM | POA: Diagnosis not present

## 2021-10-06 DIAGNOSIS — F02811 Dementia in other diseases classified elsewhere, unspecified severity, with agitation: Secondary | ICD-10-CM | POA: Diagnosis not present

## 2021-10-06 DIAGNOSIS — Z8616 Personal history of COVID-19: Secondary | ICD-10-CM | POA: Diagnosis not present

## 2021-10-06 DIAGNOSIS — I2510 Atherosclerotic heart disease of native coronary artery without angina pectoris: Secondary | ICD-10-CM | POA: Diagnosis not present

## 2021-10-06 DIAGNOSIS — I252 Old myocardial infarction: Secondary | ICD-10-CM | POA: Diagnosis not present

## 2021-10-08 DIAGNOSIS — L22 Diaper dermatitis: Secondary | ICD-10-CM | POA: Diagnosis not present

## 2021-10-08 DIAGNOSIS — E569 Vitamin deficiency, unspecified: Secondary | ICD-10-CM | POA: Diagnosis not present

## 2021-10-08 DIAGNOSIS — S81812D Laceration without foreign body, left lower leg, subsequent encounter: Secondary | ICD-10-CM | POA: Diagnosis not present

## 2021-10-08 DIAGNOSIS — L821 Other seborrheic keratosis: Secondary | ICD-10-CM | POA: Diagnosis not present

## 2021-10-09 DIAGNOSIS — F02811 Dementia in other diseases classified elsewhere, unspecified severity, with agitation: Secondary | ICD-10-CM | POA: Diagnosis not present

## 2021-10-09 DIAGNOSIS — Z6823 Body mass index (BMI) 23.0-23.9, adult: Secondary | ICD-10-CM | POA: Diagnosis not present

## 2021-10-09 DIAGNOSIS — I2510 Atherosclerotic heart disease of native coronary artery without angina pectoris: Secondary | ICD-10-CM | POA: Diagnosis not present

## 2021-10-09 DIAGNOSIS — I252 Old myocardial infarction: Secondary | ICD-10-CM | POA: Diagnosis not present

## 2021-10-09 DIAGNOSIS — R296 Repeated falls: Secondary | ICD-10-CM | POA: Diagnosis not present

## 2021-10-09 DIAGNOSIS — R627 Adult failure to thrive: Secondary | ICD-10-CM | POA: Diagnosis not present

## 2021-10-09 DIAGNOSIS — N400 Enlarged prostate without lower urinary tract symptoms: Secondary | ICD-10-CM | POA: Diagnosis not present

## 2021-10-09 DIAGNOSIS — F0390 Unspecified dementia without behavioral disturbance: Secondary | ICD-10-CM | POA: Diagnosis not present

## 2021-10-09 DIAGNOSIS — Z8616 Personal history of COVID-19: Secondary | ICD-10-CM | POA: Diagnosis not present

## 2021-10-09 DIAGNOSIS — K219 Gastro-esophageal reflux disease without esophagitis: Secondary | ICD-10-CM | POA: Diagnosis not present

## 2021-10-09 DIAGNOSIS — G309 Alzheimer's disease, unspecified: Secondary | ICD-10-CM | POA: Diagnosis not present

## 2021-10-09 DIAGNOSIS — K5900 Constipation, unspecified: Secondary | ICD-10-CM | POA: Diagnosis not present

## 2021-10-09 DIAGNOSIS — R52 Pain, unspecified: Secondary | ICD-10-CM | POA: Diagnosis not present

## 2021-10-12 DIAGNOSIS — G309 Alzheimer's disease, unspecified: Secondary | ICD-10-CM | POA: Diagnosis not present

## 2021-10-12 DIAGNOSIS — I2510 Atherosclerotic heart disease of native coronary artery without angina pectoris: Secondary | ICD-10-CM | POA: Diagnosis not present

## 2021-10-12 DIAGNOSIS — F02811 Dementia in other diseases classified elsewhere, unspecified severity, with agitation: Secondary | ICD-10-CM | POA: Diagnosis not present

## 2021-10-12 DIAGNOSIS — Z8616 Personal history of COVID-19: Secondary | ICD-10-CM | POA: Diagnosis not present

## 2021-10-12 DIAGNOSIS — R296 Repeated falls: Secondary | ICD-10-CM | POA: Diagnosis not present

## 2021-10-12 DIAGNOSIS — I252 Old myocardial infarction: Secondary | ICD-10-CM | POA: Diagnosis not present

## 2021-10-13 DIAGNOSIS — E569 Vitamin deficiency, unspecified: Secondary | ICD-10-CM | POA: Diagnosis not present

## 2021-10-13 DIAGNOSIS — L22 Diaper dermatitis: Secondary | ICD-10-CM | POA: Diagnosis not present

## 2021-10-13 DIAGNOSIS — L821 Other seborrheic keratosis: Secondary | ICD-10-CM | POA: Diagnosis not present

## 2021-10-13 DIAGNOSIS — S81812D Laceration without foreign body, left lower leg, subsequent encounter: Secondary | ICD-10-CM | POA: Diagnosis not present

## 2021-10-16 DIAGNOSIS — G309 Alzheimer's disease, unspecified: Secondary | ICD-10-CM | POA: Diagnosis not present

## 2021-10-16 DIAGNOSIS — W19XXXA Unspecified fall, initial encounter: Secondary | ICD-10-CM | POA: Diagnosis not present

## 2021-10-16 DIAGNOSIS — M6281 Muscle weakness (generalized): Secondary | ICD-10-CM | POA: Diagnosis not present

## 2021-10-16 DIAGNOSIS — F02811 Dementia in other diseases classified elsewhere, unspecified severity, with agitation: Secondary | ICD-10-CM | POA: Diagnosis not present

## 2021-10-16 DIAGNOSIS — Z9181 History of falling: Secondary | ICD-10-CM | POA: Diagnosis not present

## 2021-10-16 DIAGNOSIS — R296 Repeated falls: Secondary | ICD-10-CM | POA: Diagnosis not present

## 2021-10-16 DIAGNOSIS — R2689 Other abnormalities of gait and mobility: Secondary | ICD-10-CM | POA: Diagnosis not present

## 2021-10-16 DIAGNOSIS — I252 Old myocardial infarction: Secondary | ICD-10-CM | POA: Diagnosis not present

## 2021-10-16 DIAGNOSIS — R52 Pain, unspecified: Secondary | ICD-10-CM | POA: Diagnosis not present

## 2021-10-16 DIAGNOSIS — Z8616 Personal history of COVID-19: Secondary | ICD-10-CM | POA: Diagnosis not present

## 2021-10-16 DIAGNOSIS — I2510 Atherosclerotic heart disease of native coronary artery without angina pectoris: Secondary | ICD-10-CM | POA: Diagnosis not present

## 2021-10-16 DIAGNOSIS — F0390 Unspecified dementia without behavioral disturbance: Secondary | ICD-10-CM | POA: Diagnosis not present

## 2021-10-19 DIAGNOSIS — Z8616 Personal history of COVID-19: Secondary | ICD-10-CM | POA: Diagnosis not present

## 2021-10-19 DIAGNOSIS — R296 Repeated falls: Secondary | ICD-10-CM | POA: Diagnosis not present

## 2021-10-19 DIAGNOSIS — I252 Old myocardial infarction: Secondary | ICD-10-CM | POA: Diagnosis not present

## 2021-10-19 DIAGNOSIS — I2510 Atherosclerotic heart disease of native coronary artery without angina pectoris: Secondary | ICD-10-CM | POA: Diagnosis not present

## 2021-10-19 DIAGNOSIS — G309 Alzheimer's disease, unspecified: Secondary | ICD-10-CM | POA: Diagnosis not present

## 2021-10-19 DIAGNOSIS — F02811 Dementia in other diseases classified elsewhere, unspecified severity, with agitation: Secondary | ICD-10-CM | POA: Diagnosis not present

## 2021-10-21 DIAGNOSIS — E569 Vitamin deficiency, unspecified: Secondary | ICD-10-CM | POA: Diagnosis not present

## 2021-10-21 DIAGNOSIS — L22 Diaper dermatitis: Secondary | ICD-10-CM | POA: Diagnosis not present

## 2021-10-21 DIAGNOSIS — S81812D Laceration without foreign body, left lower leg, subsequent encounter: Secondary | ICD-10-CM | POA: Diagnosis not present

## 2021-10-21 DIAGNOSIS — L821 Other seborrheic keratosis: Secondary | ICD-10-CM | POA: Diagnosis not present

## 2021-10-23 DIAGNOSIS — I2510 Atherosclerotic heart disease of native coronary artery without angina pectoris: Secondary | ICD-10-CM | POA: Diagnosis not present

## 2021-10-23 DIAGNOSIS — R296 Repeated falls: Secondary | ICD-10-CM | POA: Diagnosis not present

## 2021-10-23 DIAGNOSIS — I252 Old myocardial infarction: Secondary | ICD-10-CM | POA: Diagnosis not present

## 2021-10-23 DIAGNOSIS — Z8616 Personal history of COVID-19: Secondary | ICD-10-CM | POA: Diagnosis not present

## 2021-10-23 DIAGNOSIS — G309 Alzheimer's disease, unspecified: Secondary | ICD-10-CM | POA: Diagnosis not present

## 2021-10-23 DIAGNOSIS — F02811 Dementia in other diseases classified elsewhere, unspecified severity, with agitation: Secondary | ICD-10-CM | POA: Diagnosis not present

## 2021-10-26 DIAGNOSIS — G309 Alzheimer's disease, unspecified: Secondary | ICD-10-CM | POA: Diagnosis not present

## 2021-10-26 DIAGNOSIS — R296 Repeated falls: Secondary | ICD-10-CM | POA: Diagnosis not present

## 2021-10-26 DIAGNOSIS — I252 Old myocardial infarction: Secondary | ICD-10-CM | POA: Diagnosis not present

## 2021-10-26 DIAGNOSIS — I2510 Atherosclerotic heart disease of native coronary artery without angina pectoris: Secondary | ICD-10-CM | POA: Diagnosis not present

## 2021-10-26 DIAGNOSIS — Z8616 Personal history of COVID-19: Secondary | ICD-10-CM | POA: Diagnosis not present

## 2021-10-26 DIAGNOSIS — F02811 Dementia in other diseases classified elsewhere, unspecified severity, with agitation: Secondary | ICD-10-CM | POA: Diagnosis not present

## 2021-10-27 DIAGNOSIS — Z8616 Personal history of COVID-19: Secondary | ICD-10-CM | POA: Diagnosis not present

## 2021-10-27 DIAGNOSIS — G309 Alzheimer's disease, unspecified: Secondary | ICD-10-CM | POA: Diagnosis not present

## 2021-10-27 DIAGNOSIS — I252 Old myocardial infarction: Secondary | ICD-10-CM | POA: Diagnosis not present

## 2021-10-27 DIAGNOSIS — W19XXXA Unspecified fall, initial encounter: Secondary | ICD-10-CM | POA: Diagnosis not present

## 2021-10-27 DIAGNOSIS — R2689 Other abnormalities of gait and mobility: Secondary | ICD-10-CM | POA: Diagnosis not present

## 2021-10-27 DIAGNOSIS — R296 Repeated falls: Secondary | ICD-10-CM | POA: Diagnosis not present

## 2021-10-27 DIAGNOSIS — R52 Pain, unspecified: Secondary | ICD-10-CM | POA: Diagnosis not present

## 2021-10-27 DIAGNOSIS — M6281 Muscle weakness (generalized): Secondary | ICD-10-CM | POA: Diagnosis not present

## 2021-10-27 DIAGNOSIS — F0390 Unspecified dementia without behavioral disturbance: Secondary | ICD-10-CM | POA: Diagnosis not present

## 2021-10-27 DIAGNOSIS — Z9181 History of falling: Secondary | ICD-10-CM | POA: Diagnosis not present

## 2021-10-27 DIAGNOSIS — F02811 Dementia in other diseases classified elsewhere, unspecified severity, with agitation: Secondary | ICD-10-CM | POA: Diagnosis not present

## 2021-10-27 DIAGNOSIS — I2510 Atherosclerotic heart disease of native coronary artery without angina pectoris: Secondary | ICD-10-CM | POA: Diagnosis not present

## 2021-10-28 DIAGNOSIS — L821 Other seborrheic keratosis: Secondary | ICD-10-CM | POA: Diagnosis not present

## 2021-10-28 DIAGNOSIS — S81812D Laceration without foreign body, left lower leg, subsequent encounter: Secondary | ICD-10-CM | POA: Diagnosis not present

## 2021-10-28 DIAGNOSIS — L22 Diaper dermatitis: Secondary | ICD-10-CM | POA: Diagnosis not present

## 2021-10-28 DIAGNOSIS — E569 Vitamin deficiency, unspecified: Secondary | ICD-10-CM | POA: Diagnosis not present

## 2021-10-29 DIAGNOSIS — Z8616 Personal history of COVID-19: Secondary | ICD-10-CM | POA: Diagnosis not present

## 2021-10-29 DIAGNOSIS — F02811 Dementia in other diseases classified elsewhere, unspecified severity, with agitation: Secondary | ICD-10-CM | POA: Diagnosis not present

## 2021-10-29 DIAGNOSIS — I252 Old myocardial infarction: Secondary | ICD-10-CM | POA: Diagnosis not present

## 2021-10-29 DIAGNOSIS — I2510 Atherosclerotic heart disease of native coronary artery without angina pectoris: Secondary | ICD-10-CM | POA: Diagnosis not present

## 2021-10-29 DIAGNOSIS — R296 Repeated falls: Secondary | ICD-10-CM | POA: Diagnosis not present

## 2021-10-29 DIAGNOSIS — G309 Alzheimer's disease, unspecified: Secondary | ICD-10-CM | POA: Diagnosis not present

## 2021-10-30 DIAGNOSIS — R296 Repeated falls: Secondary | ICD-10-CM | POA: Diagnosis not present

## 2021-10-30 DIAGNOSIS — I2510 Atherosclerotic heart disease of native coronary artery without angina pectoris: Secondary | ICD-10-CM | POA: Diagnosis not present

## 2021-10-30 DIAGNOSIS — Z8616 Personal history of COVID-19: Secondary | ICD-10-CM | POA: Diagnosis not present

## 2021-10-30 DIAGNOSIS — G309 Alzheimer's disease, unspecified: Secondary | ICD-10-CM | POA: Diagnosis not present

## 2021-10-30 DIAGNOSIS — F02811 Dementia in other diseases classified elsewhere, unspecified severity, with agitation: Secondary | ICD-10-CM | POA: Diagnosis not present

## 2021-10-30 DIAGNOSIS — I252 Old myocardial infarction: Secondary | ICD-10-CM | POA: Diagnosis not present

## 2021-11-01 DIAGNOSIS — M1990 Unspecified osteoarthritis, unspecified site: Secondary | ICD-10-CM | POA: Diagnosis not present

## 2021-11-01 DIAGNOSIS — L821 Other seborrheic keratosis: Secondary | ICD-10-CM | POA: Diagnosis not present

## 2021-11-01 DIAGNOSIS — K219 Gastro-esophageal reflux disease without esophagitis: Secondary | ICD-10-CM | POA: Diagnosis not present

## 2021-11-01 DIAGNOSIS — I252 Old myocardial infarction: Secondary | ICD-10-CM | POA: Diagnosis not present

## 2021-11-01 DIAGNOSIS — G309 Alzheimer's disease, unspecified: Secondary | ICD-10-CM | POA: Diagnosis not present

## 2021-11-01 DIAGNOSIS — Z8679 Personal history of other diseases of the circulatory system: Secondary | ICD-10-CM | POA: Diagnosis not present

## 2021-11-01 DIAGNOSIS — H109 Unspecified conjunctivitis: Secondary | ICD-10-CM | POA: Diagnosis not present

## 2021-11-01 DIAGNOSIS — I255 Ischemic cardiomyopathy: Secondary | ICD-10-CM | POA: Diagnosis not present

## 2021-11-01 DIAGNOSIS — R296 Repeated falls: Secondary | ICD-10-CM | POA: Diagnosis not present

## 2021-11-01 DIAGNOSIS — Z8616 Personal history of COVID-19: Secondary | ICD-10-CM | POA: Diagnosis not present

## 2021-11-01 DIAGNOSIS — I10 Essential (primary) hypertension: Secondary | ICD-10-CM | POA: Diagnosis not present

## 2021-11-01 DIAGNOSIS — I2510 Atherosclerotic heart disease of native coronary artery without angina pectoris: Secondary | ICD-10-CM | POA: Diagnosis not present

## 2021-11-01 DIAGNOSIS — N400 Enlarged prostate without lower urinary tract symptoms: Secondary | ICD-10-CM | POA: Diagnosis not present

## 2021-11-01 DIAGNOSIS — F02811 Dementia in other diseases classified elsewhere, unspecified severity, with agitation: Secondary | ICD-10-CM | POA: Diagnosis not present

## 2021-11-02 DIAGNOSIS — Z8616 Personal history of COVID-19: Secondary | ICD-10-CM | POA: Diagnosis not present

## 2021-11-02 DIAGNOSIS — R296 Repeated falls: Secondary | ICD-10-CM | POA: Diagnosis not present

## 2021-11-02 DIAGNOSIS — I252 Old myocardial infarction: Secondary | ICD-10-CM | POA: Diagnosis not present

## 2021-11-02 DIAGNOSIS — F02811 Dementia in other diseases classified elsewhere, unspecified severity, with agitation: Secondary | ICD-10-CM | POA: Diagnosis not present

## 2021-11-02 DIAGNOSIS — I2510 Atherosclerotic heart disease of native coronary artery without angina pectoris: Secondary | ICD-10-CM | POA: Diagnosis not present

## 2021-11-02 DIAGNOSIS — G309 Alzheimer's disease, unspecified: Secondary | ICD-10-CM | POA: Diagnosis not present

## 2021-11-05 DIAGNOSIS — M6281 Muscle weakness (generalized): Secondary | ICD-10-CM | POA: Diagnosis not present

## 2021-11-05 DIAGNOSIS — Z9181 History of falling: Secondary | ICD-10-CM | POA: Diagnosis not present

## 2021-11-05 DIAGNOSIS — R2689 Other abnormalities of gait and mobility: Secondary | ICD-10-CM | POA: Diagnosis not present

## 2021-11-05 IMAGING — DX DG CHEST 1V PORT
1 series · 1 of 1 positions shown · non-contrast
Comparison: February 17, 2019

CLINICAL DATA: Chest pain

EXAM:
PORTABLE CHEST 1 VIEW

[chest]
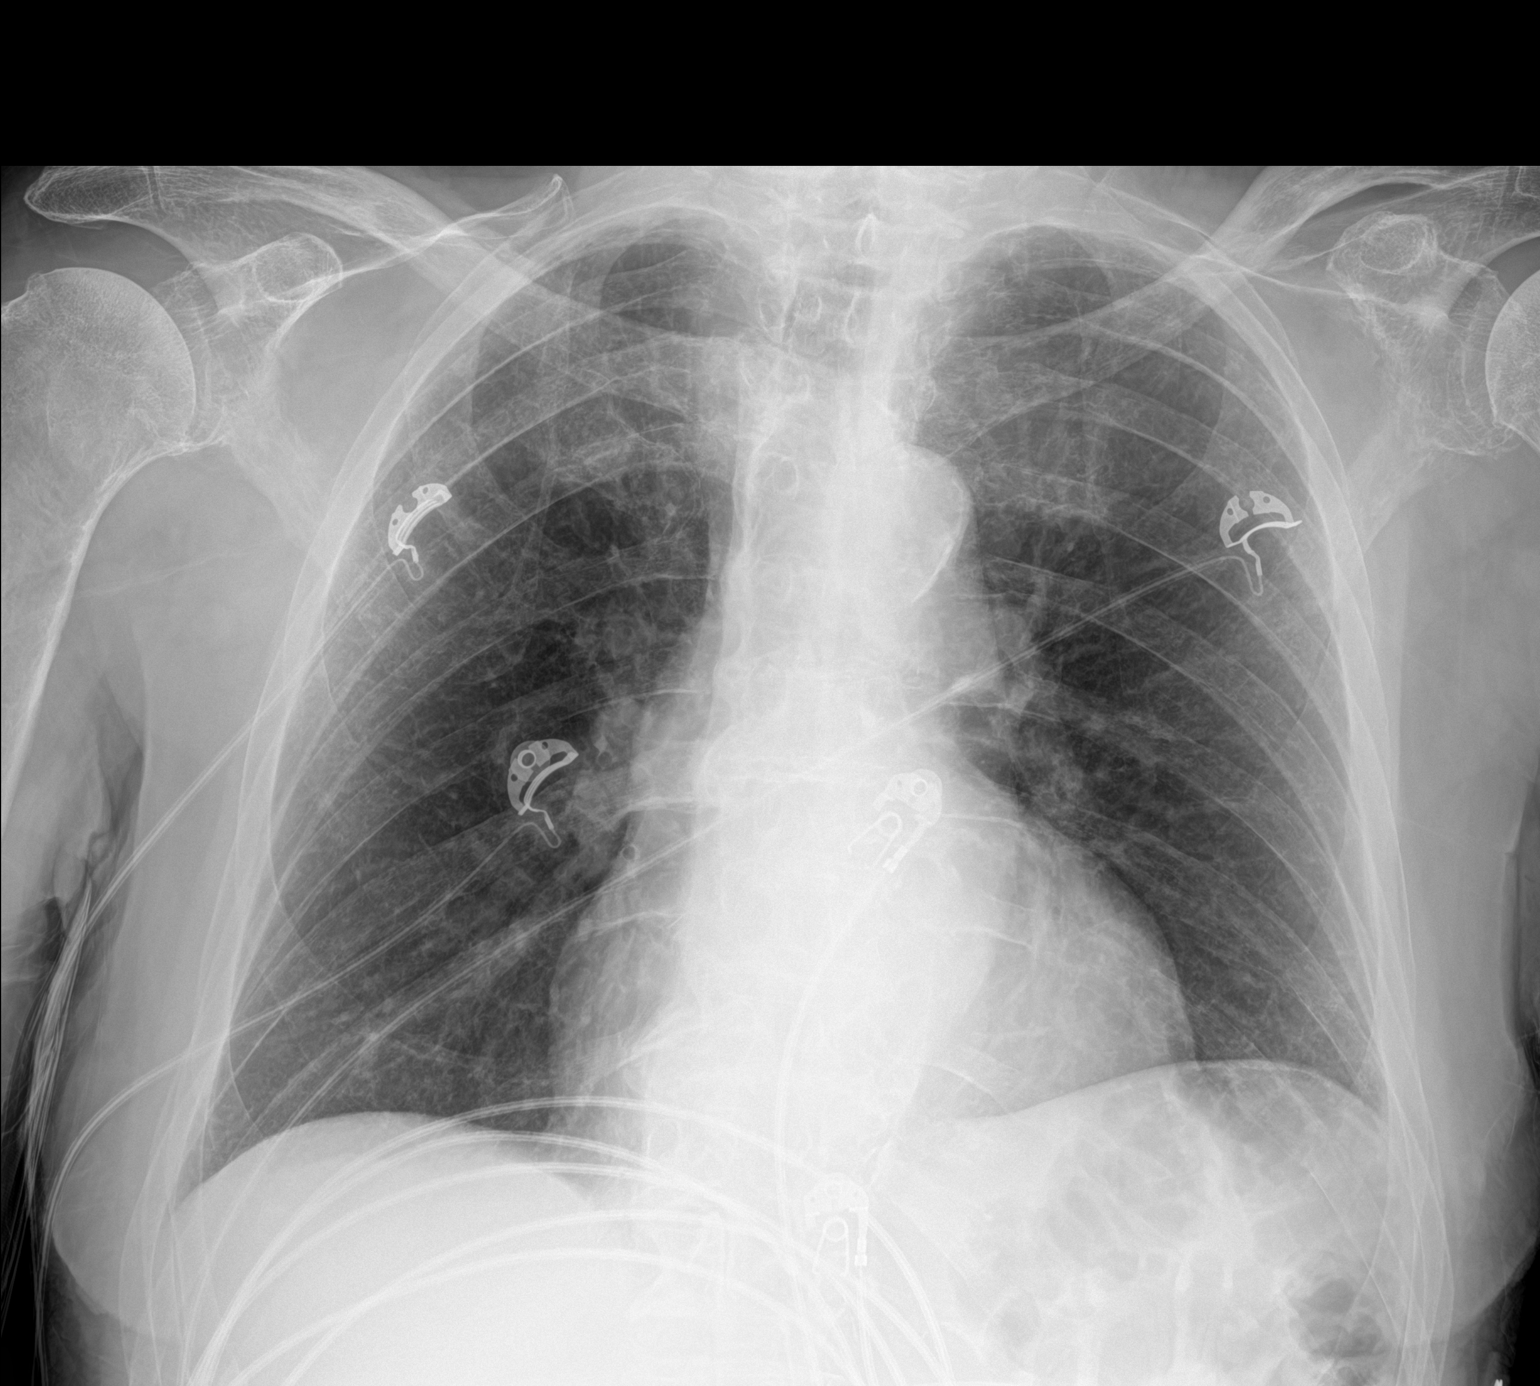

[1 of 1 positions shown; findings below may reference images not displayed]

FINDINGS: The cardiomediastinal silhouette is unchanged and mildly enlarged in
contour.Tortuous thoracic aorta. Atherosclerotic calcifications of
the aorta. Biapical pleuroparenchymal scarring. No pleural effusion.
No pneumothorax. No acute pleuroparenchymal abnormality. Visualized
abdomen is unremarkable. Multilevel degenerative changes of the
thoracic spine.
IMPRESSION: 1. No acute cardiopulmonary abnormality.

## 2021-11-06 DIAGNOSIS — L22 Diaper dermatitis: Secondary | ICD-10-CM | POA: Diagnosis not present

## 2021-11-06 DIAGNOSIS — G309 Alzheimer's disease, unspecified: Secondary | ICD-10-CM | POA: Diagnosis not present

## 2021-11-06 DIAGNOSIS — F02811 Dementia in other diseases classified elsewhere, unspecified severity, with agitation: Secondary | ICD-10-CM | POA: Diagnosis not present

## 2021-11-06 DIAGNOSIS — S81812D Laceration without foreign body, left lower leg, subsequent encounter: Secondary | ICD-10-CM | POA: Diagnosis not present

## 2021-11-06 DIAGNOSIS — R296 Repeated falls: Secondary | ICD-10-CM | POA: Diagnosis not present

## 2021-11-06 DIAGNOSIS — I2510 Atherosclerotic heart disease of native coronary artery without angina pectoris: Secondary | ICD-10-CM | POA: Diagnosis not present

## 2021-11-06 DIAGNOSIS — S80811A Abrasion, right lower leg, initial encounter: Secondary | ICD-10-CM | POA: Diagnosis not present

## 2021-11-06 DIAGNOSIS — Z8616 Personal history of COVID-19: Secondary | ICD-10-CM | POA: Diagnosis not present

## 2021-11-06 DIAGNOSIS — I252 Old myocardial infarction: Secondary | ICD-10-CM | POA: Diagnosis not present

## 2021-11-06 DIAGNOSIS — L821 Other seborrheic keratosis: Secondary | ICD-10-CM | POA: Diagnosis not present

## 2021-11-09 DIAGNOSIS — I252 Old myocardial infarction: Secondary | ICD-10-CM | POA: Diagnosis not present

## 2021-11-09 DIAGNOSIS — F02811 Dementia in other diseases classified elsewhere, unspecified severity, with agitation: Secondary | ICD-10-CM | POA: Diagnosis not present

## 2021-11-09 DIAGNOSIS — Z8616 Personal history of COVID-19: Secondary | ICD-10-CM | POA: Diagnosis not present

## 2021-11-09 DIAGNOSIS — R296 Repeated falls: Secondary | ICD-10-CM | POA: Diagnosis not present

## 2021-11-09 DIAGNOSIS — I2510 Atherosclerotic heart disease of native coronary artery without angina pectoris: Secondary | ICD-10-CM | POA: Diagnosis not present

## 2021-11-09 DIAGNOSIS — G309 Alzheimer's disease, unspecified: Secondary | ICD-10-CM | POA: Diagnosis not present

## 2021-11-11 DIAGNOSIS — Z8616 Personal history of COVID-19: Secondary | ICD-10-CM | POA: Diagnosis not present

## 2021-11-11 DIAGNOSIS — I252 Old myocardial infarction: Secondary | ICD-10-CM | POA: Diagnosis not present

## 2021-11-11 DIAGNOSIS — L821 Other seborrheic keratosis: Secondary | ICD-10-CM | POA: Diagnosis not present

## 2021-11-11 DIAGNOSIS — R296 Repeated falls: Secondary | ICD-10-CM | POA: Diagnosis not present

## 2021-11-11 DIAGNOSIS — S81812D Laceration without foreign body, left lower leg, subsequent encounter: Secondary | ICD-10-CM | POA: Diagnosis not present

## 2021-11-11 DIAGNOSIS — G309 Alzheimer's disease, unspecified: Secondary | ICD-10-CM | POA: Diagnosis not present

## 2021-11-11 DIAGNOSIS — F02811 Dementia in other diseases classified elsewhere, unspecified severity, with agitation: Secondary | ICD-10-CM | POA: Diagnosis not present

## 2021-11-11 DIAGNOSIS — I2510 Atherosclerotic heart disease of native coronary artery without angina pectoris: Secondary | ICD-10-CM | POA: Diagnosis not present

## 2021-11-11 DIAGNOSIS — S80811A Abrasion, right lower leg, initial encounter: Secondary | ICD-10-CM | POA: Diagnosis not present

## 2021-11-11 DIAGNOSIS — L22 Diaper dermatitis: Secondary | ICD-10-CM | POA: Diagnosis not present

## 2021-11-12 DIAGNOSIS — Z7689 Persons encountering health services in other specified circumstances: Secondary | ICD-10-CM | POA: Diagnosis not present

## 2021-11-12 DIAGNOSIS — F0390 Unspecified dementia without behavioral disturbance: Secondary | ICD-10-CM | POA: Diagnosis not present

## 2021-11-13 DIAGNOSIS — G309 Alzheimer's disease, unspecified: Secondary | ICD-10-CM | POA: Diagnosis not present

## 2021-11-13 DIAGNOSIS — I252 Old myocardial infarction: Secondary | ICD-10-CM | POA: Diagnosis not present

## 2021-11-13 DIAGNOSIS — R296 Repeated falls: Secondary | ICD-10-CM | POA: Diagnosis not present

## 2021-11-13 DIAGNOSIS — Z8616 Personal history of COVID-19: Secondary | ICD-10-CM | POA: Diagnosis not present

## 2021-11-13 DIAGNOSIS — I2510 Atherosclerotic heart disease of native coronary artery without angina pectoris: Secondary | ICD-10-CM | POA: Diagnosis not present

## 2021-11-13 DIAGNOSIS — F02811 Dementia in other diseases classified elsewhere, unspecified severity, with agitation: Secondary | ICD-10-CM | POA: Diagnosis not present

## 2021-11-15 DIAGNOSIS — Z8616 Personal history of COVID-19: Secondary | ICD-10-CM | POA: Diagnosis not present

## 2021-11-15 DIAGNOSIS — F02811 Dementia in other diseases classified elsewhere, unspecified severity, with agitation: Secondary | ICD-10-CM | POA: Diagnosis not present

## 2021-11-15 DIAGNOSIS — I2510 Atherosclerotic heart disease of native coronary artery without angina pectoris: Secondary | ICD-10-CM | POA: Diagnosis not present

## 2021-11-15 DIAGNOSIS — G309 Alzheimer's disease, unspecified: Secondary | ICD-10-CM | POA: Diagnosis not present

## 2021-11-15 DIAGNOSIS — R296 Repeated falls: Secondary | ICD-10-CM | POA: Diagnosis not present

## 2021-11-15 DIAGNOSIS — I252 Old myocardial infarction: Secondary | ICD-10-CM | POA: Diagnosis not present

## 2021-11-17 DIAGNOSIS — L821 Other seborrheic keratosis: Secondary | ICD-10-CM | POA: Diagnosis not present

## 2021-11-17 DIAGNOSIS — S81812D Laceration without foreign body, left lower leg, subsequent encounter: Secondary | ICD-10-CM | POA: Diagnosis not present

## 2021-11-17 DIAGNOSIS — L22 Diaper dermatitis: Secondary | ICD-10-CM | POA: Diagnosis not present

## 2021-11-17 DIAGNOSIS — S80811A Abrasion, right lower leg, initial encounter: Secondary | ICD-10-CM | POA: Diagnosis not present

## 2021-11-18 DIAGNOSIS — R296 Repeated falls: Secondary | ICD-10-CM | POA: Diagnosis not present

## 2021-11-18 DIAGNOSIS — I252 Old myocardial infarction: Secondary | ICD-10-CM | POA: Diagnosis not present

## 2021-11-18 DIAGNOSIS — F02811 Dementia in other diseases classified elsewhere, unspecified severity, with agitation: Secondary | ICD-10-CM | POA: Diagnosis not present

## 2021-11-18 DIAGNOSIS — Z8616 Personal history of COVID-19: Secondary | ICD-10-CM | POA: Diagnosis not present

## 2021-11-18 DIAGNOSIS — I2510 Atherosclerotic heart disease of native coronary artery without angina pectoris: Secondary | ICD-10-CM | POA: Diagnosis not present

## 2021-11-18 DIAGNOSIS — G309 Alzheimer's disease, unspecified: Secondary | ICD-10-CM | POA: Diagnosis not present

## 2021-11-20 DIAGNOSIS — I252 Old myocardial infarction: Secondary | ICD-10-CM | POA: Diagnosis not present

## 2021-11-20 DIAGNOSIS — I2510 Atherosclerotic heart disease of native coronary artery without angina pectoris: Secondary | ICD-10-CM | POA: Diagnosis not present

## 2021-11-20 DIAGNOSIS — R296 Repeated falls: Secondary | ICD-10-CM | POA: Diagnosis not present

## 2021-11-20 DIAGNOSIS — G309 Alzheimer's disease, unspecified: Secondary | ICD-10-CM | POA: Diagnosis not present

## 2021-11-20 DIAGNOSIS — Z8616 Personal history of COVID-19: Secondary | ICD-10-CM | POA: Diagnosis not present

## 2021-11-20 DIAGNOSIS — F02811 Dementia in other diseases classified elsewhere, unspecified severity, with agitation: Secondary | ICD-10-CM | POA: Diagnosis not present

## 2021-11-23 DIAGNOSIS — R296 Repeated falls: Secondary | ICD-10-CM | POA: Diagnosis not present

## 2021-11-23 DIAGNOSIS — Z8616 Personal history of COVID-19: Secondary | ICD-10-CM | POA: Diagnosis not present

## 2021-11-23 DIAGNOSIS — I252 Old myocardial infarction: Secondary | ICD-10-CM | POA: Diagnosis not present

## 2021-11-23 DIAGNOSIS — I2510 Atherosclerotic heart disease of native coronary artery without angina pectoris: Secondary | ICD-10-CM | POA: Diagnosis not present

## 2021-11-23 DIAGNOSIS — G309 Alzheimer's disease, unspecified: Secondary | ICD-10-CM | POA: Diagnosis not present

## 2021-11-23 DIAGNOSIS — F02811 Dementia in other diseases classified elsewhere, unspecified severity, with agitation: Secondary | ICD-10-CM | POA: Diagnosis not present

## 2021-11-24 DIAGNOSIS — R296 Repeated falls: Secondary | ICD-10-CM | POA: Diagnosis not present

## 2021-11-24 DIAGNOSIS — I252 Old myocardial infarction: Secondary | ICD-10-CM | POA: Diagnosis not present

## 2021-11-24 DIAGNOSIS — Z8616 Personal history of COVID-19: Secondary | ICD-10-CM | POA: Diagnosis not present

## 2021-11-24 DIAGNOSIS — G309 Alzheimer's disease, unspecified: Secondary | ICD-10-CM | POA: Diagnosis not present

## 2021-11-24 DIAGNOSIS — F02811 Dementia in other diseases classified elsewhere, unspecified severity, with agitation: Secondary | ICD-10-CM | POA: Diagnosis not present

## 2021-11-24 DIAGNOSIS — I2510 Atherosclerotic heart disease of native coronary artery without angina pectoris: Secondary | ICD-10-CM | POA: Diagnosis not present

## 2021-11-25 DIAGNOSIS — R296 Repeated falls: Secondary | ICD-10-CM | POA: Diagnosis not present

## 2021-11-25 DIAGNOSIS — I2510 Atherosclerotic heart disease of native coronary artery without angina pectoris: Secondary | ICD-10-CM | POA: Diagnosis not present

## 2021-11-25 DIAGNOSIS — Z8616 Personal history of COVID-19: Secondary | ICD-10-CM | POA: Diagnosis not present

## 2021-11-25 DIAGNOSIS — L22 Diaper dermatitis: Secondary | ICD-10-CM | POA: Diagnosis not present

## 2021-11-25 DIAGNOSIS — F02811 Dementia in other diseases classified elsewhere, unspecified severity, with agitation: Secondary | ICD-10-CM | POA: Diagnosis not present

## 2021-11-25 DIAGNOSIS — S80811A Abrasion, right lower leg, initial encounter: Secondary | ICD-10-CM | POA: Diagnosis not present

## 2021-11-25 DIAGNOSIS — G309 Alzheimer's disease, unspecified: Secondary | ICD-10-CM | POA: Diagnosis not present

## 2021-11-25 DIAGNOSIS — S81812D Laceration without foreign body, left lower leg, subsequent encounter: Secondary | ICD-10-CM | POA: Diagnosis not present

## 2021-11-25 DIAGNOSIS — L821 Other seborrheic keratosis: Secondary | ICD-10-CM | POA: Diagnosis not present

## 2021-11-25 DIAGNOSIS — I252 Old myocardial infarction: Secondary | ICD-10-CM | POA: Diagnosis not present

## 2021-11-28 DIAGNOSIS — G309 Alzheimer's disease, unspecified: Secondary | ICD-10-CM | POA: Diagnosis not present

## 2021-11-28 DIAGNOSIS — I2510 Atherosclerotic heart disease of native coronary artery without angina pectoris: Secondary | ICD-10-CM | POA: Diagnosis not present

## 2021-11-28 DIAGNOSIS — I252 Old myocardial infarction: Secondary | ICD-10-CM | POA: Diagnosis not present

## 2021-11-28 DIAGNOSIS — F02811 Dementia in other diseases classified elsewhere, unspecified severity, with agitation: Secondary | ICD-10-CM | POA: Diagnosis not present

## 2021-11-28 DIAGNOSIS — Z8616 Personal history of COVID-19: Secondary | ICD-10-CM | POA: Diagnosis not present

## 2021-11-28 DIAGNOSIS — R296 Repeated falls: Secondary | ICD-10-CM | POA: Diagnosis not present

## 2021-11-29 DIAGNOSIS — I2510 Atherosclerotic heart disease of native coronary artery without angina pectoris: Secondary | ICD-10-CM | POA: Diagnosis not present

## 2021-11-29 DIAGNOSIS — Z8616 Personal history of COVID-19: Secondary | ICD-10-CM | POA: Diagnosis not present

## 2021-11-29 DIAGNOSIS — F02811 Dementia in other diseases classified elsewhere, unspecified severity, with agitation: Secondary | ICD-10-CM | POA: Diagnosis not present

## 2021-11-29 DIAGNOSIS — I252 Old myocardial infarction: Secondary | ICD-10-CM | POA: Diagnosis not present

## 2021-11-29 DIAGNOSIS — R296 Repeated falls: Secondary | ICD-10-CM | POA: Diagnosis not present

## 2021-11-29 DIAGNOSIS — G309 Alzheimer's disease, unspecified: Secondary | ICD-10-CM | POA: Diagnosis not present

## 2021-11-30 DIAGNOSIS — F0390 Unspecified dementia without behavioral disturbance: Secondary | ICD-10-CM | POA: Diagnosis not present

## 2021-11-30 DIAGNOSIS — S51011A Laceration without foreign body of right elbow, initial encounter: Secondary | ICD-10-CM | POA: Diagnosis not present

## 2021-12-02 DIAGNOSIS — I2510 Atherosclerotic heart disease of native coronary artery without angina pectoris: Secondary | ICD-10-CM | POA: Diagnosis not present

## 2021-12-02 DIAGNOSIS — I255 Ischemic cardiomyopathy: Secondary | ICD-10-CM | POA: Diagnosis not present

## 2021-12-02 DIAGNOSIS — Z8679 Personal history of other diseases of the circulatory system: Secondary | ICD-10-CM | POA: Diagnosis not present

## 2021-12-02 DIAGNOSIS — F02811 Dementia in other diseases classified elsewhere, unspecified severity, with agitation: Secondary | ICD-10-CM | POA: Diagnosis not present

## 2021-12-02 DIAGNOSIS — H109 Unspecified conjunctivitis: Secondary | ICD-10-CM | POA: Diagnosis not present

## 2021-12-02 DIAGNOSIS — K219 Gastro-esophageal reflux disease without esophagitis: Secondary | ICD-10-CM | POA: Diagnosis not present

## 2021-12-02 DIAGNOSIS — N400 Enlarged prostate without lower urinary tract symptoms: Secondary | ICD-10-CM | POA: Diagnosis not present

## 2021-12-02 DIAGNOSIS — L821 Other seborrheic keratosis: Secondary | ICD-10-CM | POA: Diagnosis not present

## 2021-12-02 DIAGNOSIS — M1990 Unspecified osteoarthritis, unspecified site: Secondary | ICD-10-CM | POA: Diagnosis not present

## 2021-12-02 DIAGNOSIS — R296 Repeated falls: Secondary | ICD-10-CM | POA: Diagnosis not present

## 2021-12-02 DIAGNOSIS — G309 Alzheimer's disease, unspecified: Secondary | ICD-10-CM | POA: Diagnosis not present

## 2021-12-02 DIAGNOSIS — Z8616 Personal history of COVID-19: Secondary | ICD-10-CM | POA: Diagnosis not present

## 2021-12-02 DIAGNOSIS — I10 Essential (primary) hypertension: Secondary | ICD-10-CM | POA: Diagnosis not present

## 2021-12-02 DIAGNOSIS — I252 Old myocardial infarction: Secondary | ICD-10-CM | POA: Diagnosis not present

## 2021-12-03 DIAGNOSIS — S0001XD Abrasion of scalp, subsequent encounter: Secondary | ICD-10-CM | POA: Diagnosis not present

## 2021-12-03 DIAGNOSIS — Z8616 Personal history of COVID-19: Secondary | ICD-10-CM | POA: Diagnosis not present

## 2021-12-03 DIAGNOSIS — I252 Old myocardial infarction: Secondary | ICD-10-CM | POA: Diagnosis not present

## 2021-12-03 DIAGNOSIS — G309 Alzheimer's disease, unspecified: Secondary | ICD-10-CM | POA: Diagnosis not present

## 2021-12-03 DIAGNOSIS — I2510 Atherosclerotic heart disease of native coronary artery without angina pectoris: Secondary | ICD-10-CM | POA: Diagnosis not present

## 2021-12-03 DIAGNOSIS — R296 Repeated falls: Secondary | ICD-10-CM | POA: Diagnosis not present

## 2021-12-03 DIAGNOSIS — F0390 Unspecified dementia without behavioral disturbance: Secondary | ICD-10-CM | POA: Diagnosis not present

## 2021-12-03 DIAGNOSIS — F02811 Dementia in other diseases classified elsewhere, unspecified severity, with agitation: Secondary | ICD-10-CM | POA: Diagnosis not present

## 2021-12-03 DIAGNOSIS — H109 Unspecified conjunctivitis: Secondary | ICD-10-CM | POA: Diagnosis not present

## 2021-12-04 DIAGNOSIS — Z8616 Personal history of COVID-19: Secondary | ICD-10-CM | POA: Diagnosis not present

## 2021-12-04 DIAGNOSIS — G309 Alzheimer's disease, unspecified: Secondary | ICD-10-CM | POA: Diagnosis not present

## 2021-12-04 DIAGNOSIS — I2510 Atherosclerotic heart disease of native coronary artery without angina pectoris: Secondary | ICD-10-CM | POA: Diagnosis not present

## 2021-12-04 DIAGNOSIS — I252 Old myocardial infarction: Secondary | ICD-10-CM | POA: Diagnosis not present

## 2021-12-04 DIAGNOSIS — R296 Repeated falls: Secondary | ICD-10-CM | POA: Diagnosis not present

## 2021-12-04 DIAGNOSIS — F02811 Dementia in other diseases classified elsewhere, unspecified severity, with agitation: Secondary | ICD-10-CM | POA: Diagnosis not present

## 2021-12-07 DIAGNOSIS — Z8616 Personal history of COVID-19: Secondary | ICD-10-CM | POA: Diagnosis not present

## 2021-12-07 DIAGNOSIS — G309 Alzheimer's disease, unspecified: Secondary | ICD-10-CM | POA: Diagnosis not present

## 2021-12-07 DIAGNOSIS — R296 Repeated falls: Secondary | ICD-10-CM | POA: Diagnosis not present

## 2021-12-07 DIAGNOSIS — I252 Old myocardial infarction: Secondary | ICD-10-CM | POA: Diagnosis not present

## 2021-12-07 DIAGNOSIS — I2510 Atherosclerotic heart disease of native coronary artery without angina pectoris: Secondary | ICD-10-CM | POA: Diagnosis not present

## 2021-12-07 DIAGNOSIS — F02811 Dementia in other diseases classified elsewhere, unspecified severity, with agitation: Secondary | ICD-10-CM | POA: Diagnosis not present

## 2021-12-09 DIAGNOSIS — R296 Repeated falls: Secondary | ICD-10-CM | POA: Diagnosis not present

## 2021-12-09 DIAGNOSIS — I252 Old myocardial infarction: Secondary | ICD-10-CM | POA: Diagnosis not present

## 2021-12-09 DIAGNOSIS — I2510 Atherosclerotic heart disease of native coronary artery without angina pectoris: Secondary | ICD-10-CM | POA: Diagnosis not present

## 2021-12-09 DIAGNOSIS — F02811 Dementia in other diseases classified elsewhere, unspecified severity, with agitation: Secondary | ICD-10-CM | POA: Diagnosis not present

## 2021-12-09 DIAGNOSIS — Z8616 Personal history of COVID-19: Secondary | ICD-10-CM | POA: Diagnosis not present

## 2021-12-09 DIAGNOSIS — G309 Alzheimer's disease, unspecified: Secondary | ICD-10-CM | POA: Diagnosis not present

## 2021-12-11 DIAGNOSIS — F02811 Dementia in other diseases classified elsewhere, unspecified severity, with agitation: Secondary | ICD-10-CM | POA: Diagnosis not present

## 2021-12-11 DIAGNOSIS — Z8616 Personal history of COVID-19: Secondary | ICD-10-CM | POA: Diagnosis not present

## 2021-12-11 DIAGNOSIS — G309 Alzheimer's disease, unspecified: Secondary | ICD-10-CM | POA: Diagnosis not present

## 2021-12-11 DIAGNOSIS — I252 Old myocardial infarction: Secondary | ICD-10-CM | POA: Diagnosis not present

## 2021-12-11 DIAGNOSIS — I2510 Atherosclerotic heart disease of native coronary artery without angina pectoris: Secondary | ICD-10-CM | POA: Diagnosis not present

## 2021-12-11 DIAGNOSIS — R296 Repeated falls: Secondary | ICD-10-CM | POA: Diagnosis not present

## 2021-12-14 DIAGNOSIS — F02811 Dementia in other diseases classified elsewhere, unspecified severity, with agitation: Secondary | ICD-10-CM | POA: Diagnosis not present

## 2021-12-14 DIAGNOSIS — R296 Repeated falls: Secondary | ICD-10-CM | POA: Diagnosis not present

## 2021-12-14 DIAGNOSIS — Z8616 Personal history of COVID-19: Secondary | ICD-10-CM | POA: Diagnosis not present

## 2021-12-14 DIAGNOSIS — G309 Alzheimer's disease, unspecified: Secondary | ICD-10-CM | POA: Diagnosis not present

## 2021-12-14 DIAGNOSIS — I252 Old myocardial infarction: Secondary | ICD-10-CM | POA: Diagnosis not present

## 2021-12-14 DIAGNOSIS — I2510 Atherosclerotic heart disease of native coronary artery without angina pectoris: Secondary | ICD-10-CM | POA: Diagnosis not present

## 2021-12-16 DIAGNOSIS — F02811 Dementia in other diseases classified elsewhere, unspecified severity, with agitation: Secondary | ICD-10-CM | POA: Diagnosis not present

## 2021-12-16 DIAGNOSIS — R296 Repeated falls: Secondary | ICD-10-CM | POA: Diagnosis not present

## 2021-12-16 DIAGNOSIS — I252 Old myocardial infarction: Secondary | ICD-10-CM | POA: Diagnosis not present

## 2021-12-16 DIAGNOSIS — G309 Alzheimer's disease, unspecified: Secondary | ICD-10-CM | POA: Diagnosis not present

## 2021-12-16 DIAGNOSIS — I2510 Atherosclerotic heart disease of native coronary artery without angina pectoris: Secondary | ICD-10-CM | POA: Diagnosis not present

## 2021-12-16 DIAGNOSIS — Z8616 Personal history of COVID-19: Secondary | ICD-10-CM | POA: Diagnosis not present

## 2021-12-18 DIAGNOSIS — G309 Alzheimer's disease, unspecified: Secondary | ICD-10-CM | POA: Diagnosis not present

## 2021-12-18 DIAGNOSIS — Z8616 Personal history of COVID-19: Secondary | ICD-10-CM | POA: Diagnosis not present

## 2021-12-18 DIAGNOSIS — F02811 Dementia in other diseases classified elsewhere, unspecified severity, with agitation: Secondary | ICD-10-CM | POA: Diagnosis not present

## 2021-12-18 DIAGNOSIS — I252 Old myocardial infarction: Secondary | ICD-10-CM | POA: Diagnosis not present

## 2021-12-18 DIAGNOSIS — I2510 Atherosclerotic heart disease of native coronary artery without angina pectoris: Secondary | ICD-10-CM | POA: Diagnosis not present

## 2021-12-18 DIAGNOSIS — R296 Repeated falls: Secondary | ICD-10-CM | POA: Diagnosis not present

## 2021-12-21 DIAGNOSIS — R296 Repeated falls: Secondary | ICD-10-CM | POA: Diagnosis not present

## 2021-12-21 DIAGNOSIS — I252 Old myocardial infarction: Secondary | ICD-10-CM | POA: Diagnosis not present

## 2021-12-21 DIAGNOSIS — Z8616 Personal history of COVID-19: Secondary | ICD-10-CM | POA: Diagnosis not present

## 2021-12-21 DIAGNOSIS — G309 Alzheimer's disease, unspecified: Secondary | ICD-10-CM | POA: Diagnosis not present

## 2021-12-21 DIAGNOSIS — I2510 Atherosclerotic heart disease of native coronary artery without angina pectoris: Secondary | ICD-10-CM | POA: Diagnosis not present

## 2021-12-21 DIAGNOSIS — F02811 Dementia in other diseases classified elsewhere, unspecified severity, with agitation: Secondary | ICD-10-CM | POA: Diagnosis not present

## 2021-12-23 DIAGNOSIS — I2510 Atherosclerotic heart disease of native coronary artery without angina pectoris: Secondary | ICD-10-CM | POA: Diagnosis not present

## 2021-12-23 DIAGNOSIS — R296 Repeated falls: Secondary | ICD-10-CM | POA: Diagnosis not present

## 2021-12-23 DIAGNOSIS — G309 Alzheimer's disease, unspecified: Secondary | ICD-10-CM | POA: Diagnosis not present

## 2021-12-23 DIAGNOSIS — I252 Old myocardial infarction: Secondary | ICD-10-CM | POA: Diagnosis not present

## 2021-12-23 DIAGNOSIS — F02811 Dementia in other diseases classified elsewhere, unspecified severity, with agitation: Secondary | ICD-10-CM | POA: Diagnosis not present

## 2021-12-23 DIAGNOSIS — Z8616 Personal history of COVID-19: Secondary | ICD-10-CM | POA: Diagnosis not present

## 2021-12-24 DIAGNOSIS — G309 Alzheimer's disease, unspecified: Secondary | ICD-10-CM | POA: Diagnosis not present

## 2021-12-24 DIAGNOSIS — F02811 Dementia in other diseases classified elsewhere, unspecified severity, with agitation: Secondary | ICD-10-CM | POA: Diagnosis not present

## 2021-12-24 DIAGNOSIS — I252 Old myocardial infarction: Secondary | ICD-10-CM | POA: Diagnosis not present

## 2021-12-24 DIAGNOSIS — R296 Repeated falls: Secondary | ICD-10-CM | POA: Diagnosis not present

## 2021-12-24 DIAGNOSIS — Z8616 Personal history of COVID-19: Secondary | ICD-10-CM | POA: Diagnosis not present

## 2021-12-24 DIAGNOSIS — I2510 Atherosclerotic heart disease of native coronary artery without angina pectoris: Secondary | ICD-10-CM | POA: Diagnosis not present

## 2021-12-28 DIAGNOSIS — Z8616 Personal history of COVID-19: Secondary | ICD-10-CM | POA: Diagnosis not present

## 2021-12-28 DIAGNOSIS — F02811 Dementia in other diseases classified elsewhere, unspecified severity, with agitation: Secondary | ICD-10-CM | POA: Diagnosis not present

## 2021-12-28 DIAGNOSIS — I2510 Atherosclerotic heart disease of native coronary artery without angina pectoris: Secondary | ICD-10-CM | POA: Diagnosis not present

## 2021-12-28 DIAGNOSIS — G309 Alzheimer's disease, unspecified: Secondary | ICD-10-CM | POA: Diagnosis not present

## 2021-12-28 DIAGNOSIS — R296 Repeated falls: Secondary | ICD-10-CM | POA: Diagnosis not present

## 2021-12-28 DIAGNOSIS — I252 Old myocardial infarction: Secondary | ICD-10-CM | POA: Diagnosis not present

## 2021-12-29 DIAGNOSIS — R627 Adult failure to thrive: Secondary | ICD-10-CM | POA: Diagnosis not present

## 2021-12-29 DIAGNOSIS — N400 Enlarged prostate without lower urinary tract symptoms: Secondary | ICD-10-CM | POA: Diagnosis not present

## 2021-12-29 DIAGNOSIS — I10 Essential (primary) hypertension: Secondary | ICD-10-CM | POA: Diagnosis not present

## 2021-12-29 DIAGNOSIS — F0390 Unspecified dementia without behavioral disturbance: Secondary | ICD-10-CM | POA: Diagnosis not present

## 2021-12-30 DIAGNOSIS — R296 Repeated falls: Secondary | ICD-10-CM | POA: Diagnosis not present

## 2021-12-30 DIAGNOSIS — G309 Alzheimer's disease, unspecified: Secondary | ICD-10-CM | POA: Diagnosis not present

## 2021-12-30 DIAGNOSIS — F02811 Dementia in other diseases classified elsewhere, unspecified severity, with agitation: Secondary | ICD-10-CM | POA: Diagnosis not present

## 2021-12-30 DIAGNOSIS — I2510 Atherosclerotic heart disease of native coronary artery without angina pectoris: Secondary | ICD-10-CM | POA: Diagnosis not present

## 2021-12-30 DIAGNOSIS — I252 Old myocardial infarction: Secondary | ICD-10-CM | POA: Diagnosis not present

## 2021-12-30 DIAGNOSIS — Z8616 Personal history of COVID-19: Secondary | ICD-10-CM | POA: Diagnosis not present

## 2022-01-01 DIAGNOSIS — I255 Ischemic cardiomyopathy: Secondary | ICD-10-CM | POA: Diagnosis not present

## 2022-01-01 DIAGNOSIS — I2510 Atherosclerotic heart disease of native coronary artery without angina pectoris: Secondary | ICD-10-CM | POA: Diagnosis not present

## 2022-01-01 DIAGNOSIS — Z8679 Personal history of other diseases of the circulatory system: Secondary | ICD-10-CM | POA: Diagnosis not present

## 2022-01-01 DIAGNOSIS — Z8616 Personal history of COVID-19: Secondary | ICD-10-CM | POA: Diagnosis not present

## 2022-01-01 DIAGNOSIS — R296 Repeated falls: Secondary | ICD-10-CM | POA: Diagnosis not present

## 2022-01-01 DIAGNOSIS — H109 Unspecified conjunctivitis: Secondary | ICD-10-CM | POA: Diagnosis not present

## 2022-01-01 DIAGNOSIS — L821 Other seborrheic keratosis: Secondary | ICD-10-CM | POA: Diagnosis not present

## 2022-01-01 DIAGNOSIS — I10 Essential (primary) hypertension: Secondary | ICD-10-CM | POA: Diagnosis not present

## 2022-01-01 DIAGNOSIS — F02811 Dementia in other diseases classified elsewhere, unspecified severity, with agitation: Secondary | ICD-10-CM | POA: Diagnosis not present

## 2022-01-01 DIAGNOSIS — K219 Gastro-esophageal reflux disease without esophagitis: Secondary | ICD-10-CM | POA: Diagnosis not present

## 2022-01-01 DIAGNOSIS — N400 Enlarged prostate without lower urinary tract symptoms: Secondary | ICD-10-CM | POA: Diagnosis not present

## 2022-01-01 DIAGNOSIS — I252 Old myocardial infarction: Secondary | ICD-10-CM | POA: Diagnosis not present

## 2022-01-01 DIAGNOSIS — M1990 Unspecified osteoarthritis, unspecified site: Secondary | ICD-10-CM | POA: Diagnosis not present

## 2022-01-01 DIAGNOSIS — G309 Alzheimer's disease, unspecified: Secondary | ICD-10-CM | POA: Diagnosis not present

## 2022-01-04 DIAGNOSIS — Z8616 Personal history of COVID-19: Secondary | ICD-10-CM | POA: Diagnosis not present

## 2022-01-04 DIAGNOSIS — I2510 Atherosclerotic heart disease of native coronary artery without angina pectoris: Secondary | ICD-10-CM | POA: Diagnosis not present

## 2022-01-04 DIAGNOSIS — R296 Repeated falls: Secondary | ICD-10-CM | POA: Diagnosis not present

## 2022-01-04 DIAGNOSIS — I252 Old myocardial infarction: Secondary | ICD-10-CM | POA: Diagnosis not present

## 2022-01-04 DIAGNOSIS — G309 Alzheimer's disease, unspecified: Secondary | ICD-10-CM | POA: Diagnosis not present

## 2022-01-04 DIAGNOSIS — F02811 Dementia in other diseases classified elsewhere, unspecified severity, with agitation: Secondary | ICD-10-CM | POA: Diagnosis not present

## 2022-01-06 DIAGNOSIS — G309 Alzheimer's disease, unspecified: Secondary | ICD-10-CM | POA: Diagnosis not present

## 2022-01-06 DIAGNOSIS — F02811 Dementia in other diseases classified elsewhere, unspecified severity, with agitation: Secondary | ICD-10-CM | POA: Diagnosis not present

## 2022-01-06 DIAGNOSIS — Z8616 Personal history of COVID-19: Secondary | ICD-10-CM | POA: Diagnosis not present

## 2022-01-06 DIAGNOSIS — I2510 Atherosclerotic heart disease of native coronary artery without angina pectoris: Secondary | ICD-10-CM | POA: Diagnosis not present

## 2022-01-06 DIAGNOSIS — I252 Old myocardial infarction: Secondary | ICD-10-CM | POA: Diagnosis not present

## 2022-01-06 DIAGNOSIS — R296 Repeated falls: Secondary | ICD-10-CM | POA: Diagnosis not present

## 2022-01-08 DIAGNOSIS — I2510 Atherosclerotic heart disease of native coronary artery without angina pectoris: Secondary | ICD-10-CM | POA: Diagnosis not present

## 2022-01-08 DIAGNOSIS — I252 Old myocardial infarction: Secondary | ICD-10-CM | POA: Diagnosis not present

## 2022-01-08 DIAGNOSIS — Z8616 Personal history of COVID-19: Secondary | ICD-10-CM | POA: Diagnosis not present

## 2022-01-08 DIAGNOSIS — G309 Alzheimer's disease, unspecified: Secondary | ICD-10-CM | POA: Diagnosis not present

## 2022-01-08 DIAGNOSIS — R296 Repeated falls: Secondary | ICD-10-CM | POA: Diagnosis not present

## 2022-01-08 DIAGNOSIS — F02811 Dementia in other diseases classified elsewhere, unspecified severity, with agitation: Secondary | ICD-10-CM | POA: Diagnosis not present

## 2022-01-11 DIAGNOSIS — Z8719 Personal history of other diseases of the digestive system: Secondary | ICD-10-CM | POA: Diagnosis not present

## 2022-01-11 DIAGNOSIS — M79662 Pain in left lower leg: Secondary | ICD-10-CM | POA: Diagnosis not present

## 2022-01-11 DIAGNOSIS — R296 Repeated falls: Secondary | ICD-10-CM | POA: Diagnosis not present

## 2022-01-11 DIAGNOSIS — F02811 Dementia in other diseases classified elsewhere, unspecified severity, with agitation: Secondary | ICD-10-CM | POA: Diagnosis not present

## 2022-01-11 DIAGNOSIS — M79661 Pain in right lower leg: Secondary | ICD-10-CM | POA: Diagnosis not present

## 2022-01-11 DIAGNOSIS — G309 Alzheimer's disease, unspecified: Secondary | ICD-10-CM | POA: Diagnosis not present

## 2022-01-11 DIAGNOSIS — R627 Adult failure to thrive: Secondary | ICD-10-CM | POA: Diagnosis not present

## 2022-01-11 DIAGNOSIS — I252 Old myocardial infarction: Secondary | ICD-10-CM | POA: Diagnosis not present

## 2022-01-11 DIAGNOSIS — I2510 Atherosclerotic heart disease of native coronary artery without angina pectoris: Secondary | ICD-10-CM | POA: Diagnosis not present

## 2022-01-11 DIAGNOSIS — Z8616 Personal history of COVID-19: Secondary | ICD-10-CM | POA: Diagnosis not present

## 2022-01-11 DIAGNOSIS — F0390 Unspecified dementia without behavioral disturbance: Secondary | ICD-10-CM | POA: Diagnosis not present

## 2022-01-13 DIAGNOSIS — G309 Alzheimer's disease, unspecified: Secondary | ICD-10-CM | POA: Diagnosis not present

## 2022-01-13 DIAGNOSIS — Z8616 Personal history of COVID-19: Secondary | ICD-10-CM | POA: Diagnosis not present

## 2022-01-13 DIAGNOSIS — I252 Old myocardial infarction: Secondary | ICD-10-CM | POA: Diagnosis not present

## 2022-01-13 DIAGNOSIS — I2510 Atherosclerotic heart disease of native coronary artery without angina pectoris: Secondary | ICD-10-CM | POA: Diagnosis not present

## 2022-01-13 DIAGNOSIS — F02811 Dementia in other diseases classified elsewhere, unspecified severity, with agitation: Secondary | ICD-10-CM | POA: Diagnosis not present

## 2022-01-13 DIAGNOSIS — R296 Repeated falls: Secondary | ICD-10-CM | POA: Diagnosis not present

## 2022-01-15 DIAGNOSIS — R296 Repeated falls: Secondary | ICD-10-CM | POA: Diagnosis not present

## 2022-01-15 DIAGNOSIS — I2510 Atherosclerotic heart disease of native coronary artery without angina pectoris: Secondary | ICD-10-CM | POA: Diagnosis not present

## 2022-01-15 DIAGNOSIS — Z8616 Personal history of COVID-19: Secondary | ICD-10-CM | POA: Diagnosis not present

## 2022-01-15 DIAGNOSIS — I252 Old myocardial infarction: Secondary | ICD-10-CM | POA: Diagnosis not present

## 2022-01-15 DIAGNOSIS — G309 Alzheimer's disease, unspecified: Secondary | ICD-10-CM | POA: Diagnosis not present

## 2022-01-15 DIAGNOSIS — F02811 Dementia in other diseases classified elsewhere, unspecified severity, with agitation: Secondary | ICD-10-CM | POA: Diagnosis not present

## 2022-01-18 DIAGNOSIS — I2510 Atherosclerotic heart disease of native coronary artery without angina pectoris: Secondary | ICD-10-CM | POA: Diagnosis not present

## 2022-01-18 DIAGNOSIS — Z8616 Personal history of COVID-19: Secondary | ICD-10-CM | POA: Diagnosis not present

## 2022-01-18 DIAGNOSIS — R296 Repeated falls: Secondary | ICD-10-CM | POA: Diagnosis not present

## 2022-01-18 DIAGNOSIS — G309 Alzheimer's disease, unspecified: Secondary | ICD-10-CM | POA: Diagnosis not present

## 2022-01-18 DIAGNOSIS — I252 Old myocardial infarction: Secondary | ICD-10-CM | POA: Diagnosis not present

## 2022-01-18 DIAGNOSIS — F02811 Dementia in other diseases classified elsewhere, unspecified severity, with agitation: Secondary | ICD-10-CM | POA: Diagnosis not present

## 2022-01-20 DIAGNOSIS — F02811 Dementia in other diseases classified elsewhere, unspecified severity, with agitation: Secondary | ICD-10-CM | POA: Diagnosis not present

## 2022-01-20 DIAGNOSIS — R296 Repeated falls: Secondary | ICD-10-CM | POA: Diagnosis not present

## 2022-01-20 DIAGNOSIS — I2510 Atherosclerotic heart disease of native coronary artery without angina pectoris: Secondary | ICD-10-CM | POA: Diagnosis not present

## 2022-01-20 DIAGNOSIS — I252 Old myocardial infarction: Secondary | ICD-10-CM | POA: Diagnosis not present

## 2022-01-20 DIAGNOSIS — G309 Alzheimer's disease, unspecified: Secondary | ICD-10-CM | POA: Diagnosis not present

## 2022-01-20 DIAGNOSIS — Z8616 Personal history of COVID-19: Secondary | ICD-10-CM | POA: Diagnosis not present

## 2022-01-22 DIAGNOSIS — Z8616 Personal history of COVID-19: Secondary | ICD-10-CM | POA: Diagnosis not present

## 2022-01-22 DIAGNOSIS — R296 Repeated falls: Secondary | ICD-10-CM | POA: Diagnosis not present

## 2022-01-22 DIAGNOSIS — F02811 Dementia in other diseases classified elsewhere, unspecified severity, with agitation: Secondary | ICD-10-CM | POA: Diagnosis not present

## 2022-01-22 DIAGNOSIS — I2510 Atherosclerotic heart disease of native coronary artery without angina pectoris: Secondary | ICD-10-CM | POA: Diagnosis not present

## 2022-01-22 DIAGNOSIS — G309 Alzheimer's disease, unspecified: Secondary | ICD-10-CM | POA: Diagnosis not present

## 2022-01-22 DIAGNOSIS — I252 Old myocardial infarction: Secondary | ICD-10-CM | POA: Diagnosis not present

## 2022-01-25 DIAGNOSIS — F02811 Dementia in other diseases classified elsewhere, unspecified severity, with agitation: Secondary | ICD-10-CM | POA: Diagnosis not present

## 2022-01-25 DIAGNOSIS — I2510 Atherosclerotic heart disease of native coronary artery without angina pectoris: Secondary | ICD-10-CM | POA: Diagnosis not present

## 2022-01-25 DIAGNOSIS — Z8616 Personal history of COVID-19: Secondary | ICD-10-CM | POA: Diagnosis not present

## 2022-01-25 DIAGNOSIS — I252 Old myocardial infarction: Secondary | ICD-10-CM | POA: Diagnosis not present

## 2022-01-25 DIAGNOSIS — G309 Alzheimer's disease, unspecified: Secondary | ICD-10-CM | POA: Diagnosis not present

## 2022-01-25 DIAGNOSIS — R296 Repeated falls: Secondary | ICD-10-CM | POA: Diagnosis not present

## 2022-01-27 DIAGNOSIS — Z8616 Personal history of COVID-19: Secondary | ICD-10-CM | POA: Diagnosis not present

## 2022-01-27 DIAGNOSIS — R296 Repeated falls: Secondary | ICD-10-CM | POA: Diagnosis not present

## 2022-01-27 DIAGNOSIS — I252 Old myocardial infarction: Secondary | ICD-10-CM | POA: Diagnosis not present

## 2022-01-27 DIAGNOSIS — G309 Alzheimer's disease, unspecified: Secondary | ICD-10-CM | POA: Diagnosis not present

## 2022-01-27 DIAGNOSIS — F02811 Dementia in other diseases classified elsewhere, unspecified severity, with agitation: Secondary | ICD-10-CM | POA: Diagnosis not present

## 2022-01-27 DIAGNOSIS — I2510 Atherosclerotic heart disease of native coronary artery without angina pectoris: Secondary | ICD-10-CM | POA: Diagnosis not present

## 2022-01-29 DIAGNOSIS — Z8616 Personal history of COVID-19: Secondary | ICD-10-CM | POA: Diagnosis not present

## 2022-01-29 DIAGNOSIS — F02811 Dementia in other diseases classified elsewhere, unspecified severity, with agitation: Secondary | ICD-10-CM | POA: Diagnosis not present

## 2022-01-29 DIAGNOSIS — R296 Repeated falls: Secondary | ICD-10-CM | POA: Diagnosis not present

## 2022-01-29 DIAGNOSIS — I2510 Atherosclerotic heart disease of native coronary artery without angina pectoris: Secondary | ICD-10-CM | POA: Diagnosis not present

## 2022-01-29 DIAGNOSIS — G309 Alzheimer's disease, unspecified: Secondary | ICD-10-CM | POA: Diagnosis not present

## 2022-01-29 DIAGNOSIS — I252 Old myocardial infarction: Secondary | ICD-10-CM | POA: Diagnosis not present

## 2022-02-01 DIAGNOSIS — G309 Alzheimer's disease, unspecified: Secondary | ICD-10-CM | POA: Diagnosis not present

## 2022-02-01 DIAGNOSIS — I252 Old myocardial infarction: Secondary | ICD-10-CM | POA: Diagnosis not present

## 2022-02-01 DIAGNOSIS — H04129 Dry eye syndrome of unspecified lacrimal gland: Secondary | ICD-10-CM | POA: Diagnosis not present

## 2022-02-01 DIAGNOSIS — I10 Essential (primary) hypertension: Secondary | ICD-10-CM | POA: Diagnosis not present

## 2022-02-01 DIAGNOSIS — F02811 Dementia in other diseases classified elsewhere, unspecified severity, with agitation: Secondary | ICD-10-CM | POA: Diagnosis not present

## 2022-02-01 DIAGNOSIS — I255 Ischemic cardiomyopathy: Secondary | ICD-10-CM | POA: Diagnosis not present

## 2022-02-01 DIAGNOSIS — K219 Gastro-esophageal reflux disease without esophagitis: Secondary | ICD-10-CM | POA: Diagnosis not present

## 2022-02-01 DIAGNOSIS — Z8616 Personal history of COVID-19: Secondary | ICD-10-CM | POA: Diagnosis not present

## 2022-02-01 DIAGNOSIS — N400 Enlarged prostate without lower urinary tract symptoms: Secondary | ICD-10-CM | POA: Diagnosis not present

## 2022-02-01 DIAGNOSIS — H109 Unspecified conjunctivitis: Secondary | ICD-10-CM | POA: Diagnosis not present

## 2022-02-01 DIAGNOSIS — Z8679 Personal history of other diseases of the circulatory system: Secondary | ICD-10-CM | POA: Diagnosis not present

## 2022-02-01 DIAGNOSIS — I2510 Atherosclerotic heart disease of native coronary artery without angina pectoris: Secondary | ICD-10-CM | POA: Diagnosis not present

## 2022-02-01 DIAGNOSIS — R296 Repeated falls: Secondary | ICD-10-CM | POA: Diagnosis not present

## 2022-02-01 DIAGNOSIS — M1990 Unspecified osteoarthritis, unspecified site: Secondary | ICD-10-CM | POA: Diagnosis not present

## 2022-02-01 DIAGNOSIS — L821 Other seborrheic keratosis: Secondary | ICD-10-CM | POA: Diagnosis not present

## 2022-02-03 DIAGNOSIS — F02811 Dementia in other diseases classified elsewhere, unspecified severity, with agitation: Secondary | ICD-10-CM | POA: Diagnosis not present

## 2022-02-03 DIAGNOSIS — I252 Old myocardial infarction: Secondary | ICD-10-CM | POA: Diagnosis not present

## 2022-02-03 DIAGNOSIS — I2510 Atherosclerotic heart disease of native coronary artery without angina pectoris: Secondary | ICD-10-CM | POA: Diagnosis not present

## 2022-02-03 DIAGNOSIS — Z8616 Personal history of COVID-19: Secondary | ICD-10-CM | POA: Diagnosis not present

## 2022-02-03 DIAGNOSIS — G309 Alzheimer's disease, unspecified: Secondary | ICD-10-CM | POA: Diagnosis not present

## 2022-02-03 DIAGNOSIS — R296 Repeated falls: Secondary | ICD-10-CM | POA: Diagnosis not present

## 2022-02-05 DIAGNOSIS — R296 Repeated falls: Secondary | ICD-10-CM | POA: Diagnosis not present

## 2022-02-05 DIAGNOSIS — Z8616 Personal history of COVID-19: Secondary | ICD-10-CM | POA: Diagnosis not present

## 2022-02-05 DIAGNOSIS — I252 Old myocardial infarction: Secondary | ICD-10-CM | POA: Diagnosis not present

## 2022-02-05 DIAGNOSIS — G309 Alzheimer's disease, unspecified: Secondary | ICD-10-CM | POA: Diagnosis not present

## 2022-02-05 DIAGNOSIS — I2510 Atherosclerotic heart disease of native coronary artery without angina pectoris: Secondary | ICD-10-CM | POA: Diagnosis not present

## 2022-02-05 DIAGNOSIS — F02811 Dementia in other diseases classified elsewhere, unspecified severity, with agitation: Secondary | ICD-10-CM | POA: Diagnosis not present

## 2022-02-08 DIAGNOSIS — F02811 Dementia in other diseases classified elsewhere, unspecified severity, with agitation: Secondary | ICD-10-CM | POA: Diagnosis not present

## 2022-02-08 DIAGNOSIS — R296 Repeated falls: Secondary | ICD-10-CM | POA: Diagnosis not present

## 2022-02-08 DIAGNOSIS — I252 Old myocardial infarction: Secondary | ICD-10-CM | POA: Diagnosis not present

## 2022-02-08 DIAGNOSIS — I2510 Atherosclerotic heart disease of native coronary artery without angina pectoris: Secondary | ICD-10-CM | POA: Diagnosis not present

## 2022-02-08 DIAGNOSIS — G309 Alzheimer's disease, unspecified: Secondary | ICD-10-CM | POA: Diagnosis not present

## 2022-02-08 DIAGNOSIS — Z8616 Personal history of COVID-19: Secondary | ICD-10-CM | POA: Diagnosis not present

## 2022-02-10 DIAGNOSIS — F02811 Dementia in other diseases classified elsewhere, unspecified severity, with agitation: Secondary | ICD-10-CM | POA: Diagnosis not present

## 2022-02-10 DIAGNOSIS — Z8616 Personal history of COVID-19: Secondary | ICD-10-CM | POA: Diagnosis not present

## 2022-02-10 DIAGNOSIS — I252 Old myocardial infarction: Secondary | ICD-10-CM | POA: Diagnosis not present

## 2022-02-10 DIAGNOSIS — R296 Repeated falls: Secondary | ICD-10-CM | POA: Diagnosis not present

## 2022-02-10 DIAGNOSIS — I2510 Atherosclerotic heart disease of native coronary artery without angina pectoris: Secondary | ICD-10-CM | POA: Diagnosis not present

## 2022-02-10 DIAGNOSIS — G309 Alzheimer's disease, unspecified: Secondary | ICD-10-CM | POA: Diagnosis not present

## 2022-02-12 DIAGNOSIS — K5900 Constipation, unspecified: Secondary | ICD-10-CM | POA: Diagnosis not present

## 2022-02-12 DIAGNOSIS — F02811 Dementia in other diseases classified elsewhere, unspecified severity, with agitation: Secondary | ICD-10-CM | POA: Diagnosis not present

## 2022-02-12 DIAGNOSIS — G309 Alzheimer's disease, unspecified: Secondary | ICD-10-CM | POA: Diagnosis not present

## 2022-02-12 DIAGNOSIS — I2510 Atherosclerotic heart disease of native coronary artery without angina pectoris: Secondary | ICD-10-CM | POA: Diagnosis not present

## 2022-02-12 DIAGNOSIS — I252 Old myocardial infarction: Secondary | ICD-10-CM | POA: Diagnosis not present

## 2022-02-12 DIAGNOSIS — R296 Repeated falls: Secondary | ICD-10-CM | POA: Diagnosis not present

## 2022-02-12 DIAGNOSIS — Z8616 Personal history of COVID-19: Secondary | ICD-10-CM | POA: Diagnosis not present

## 2022-02-12 DIAGNOSIS — Z6821 Body mass index (BMI) 21.0-21.9, adult: Secondary | ICD-10-CM | POA: Diagnosis not present

## 2022-02-12 DIAGNOSIS — F0390 Unspecified dementia without behavioral disturbance: Secondary | ICD-10-CM | POA: Diagnosis not present

## 2022-02-12 DIAGNOSIS — R627 Adult failure to thrive: Secondary | ICD-10-CM | POA: Diagnosis not present

## 2022-02-12 DIAGNOSIS — N400 Enlarged prostate without lower urinary tract symptoms: Secondary | ICD-10-CM | POA: Diagnosis not present

## 2022-02-15 DIAGNOSIS — G309 Alzheimer's disease, unspecified: Secondary | ICD-10-CM | POA: Diagnosis not present

## 2022-02-15 DIAGNOSIS — F02811 Dementia in other diseases classified elsewhere, unspecified severity, with agitation: Secondary | ICD-10-CM | POA: Diagnosis not present

## 2022-02-15 DIAGNOSIS — I2510 Atherosclerotic heart disease of native coronary artery without angina pectoris: Secondary | ICD-10-CM | POA: Diagnosis not present

## 2022-02-15 DIAGNOSIS — Z8616 Personal history of COVID-19: Secondary | ICD-10-CM | POA: Diagnosis not present

## 2022-02-15 DIAGNOSIS — R296 Repeated falls: Secondary | ICD-10-CM | POA: Diagnosis not present

## 2022-02-15 DIAGNOSIS — I252 Old myocardial infarction: Secondary | ICD-10-CM | POA: Diagnosis not present

## 2022-02-17 DIAGNOSIS — F02811 Dementia in other diseases classified elsewhere, unspecified severity, with agitation: Secondary | ICD-10-CM | POA: Diagnosis not present

## 2022-02-17 DIAGNOSIS — G309 Alzheimer's disease, unspecified: Secondary | ICD-10-CM | POA: Diagnosis not present

## 2022-02-17 DIAGNOSIS — Z8616 Personal history of COVID-19: Secondary | ICD-10-CM | POA: Diagnosis not present

## 2022-02-17 DIAGNOSIS — R296 Repeated falls: Secondary | ICD-10-CM | POA: Diagnosis not present

## 2022-02-17 DIAGNOSIS — I252 Old myocardial infarction: Secondary | ICD-10-CM | POA: Diagnosis not present

## 2022-02-17 DIAGNOSIS — I2510 Atherosclerotic heart disease of native coronary artery without angina pectoris: Secondary | ICD-10-CM | POA: Diagnosis not present

## 2022-02-19 DIAGNOSIS — F02811 Dementia in other diseases classified elsewhere, unspecified severity, with agitation: Secondary | ICD-10-CM | POA: Diagnosis not present

## 2022-02-19 DIAGNOSIS — I252 Old myocardial infarction: Secondary | ICD-10-CM | POA: Diagnosis not present

## 2022-02-19 DIAGNOSIS — R296 Repeated falls: Secondary | ICD-10-CM | POA: Diagnosis not present

## 2022-02-19 DIAGNOSIS — I2510 Atherosclerotic heart disease of native coronary artery without angina pectoris: Secondary | ICD-10-CM | POA: Diagnosis not present

## 2022-02-19 DIAGNOSIS — Z8616 Personal history of COVID-19: Secondary | ICD-10-CM | POA: Diagnosis not present

## 2022-02-19 DIAGNOSIS — G309 Alzheimer's disease, unspecified: Secondary | ICD-10-CM | POA: Diagnosis not present

## 2022-02-22 DIAGNOSIS — I2510 Atherosclerotic heart disease of native coronary artery without angina pectoris: Secondary | ICD-10-CM | POA: Diagnosis not present

## 2022-02-22 DIAGNOSIS — G309 Alzheimer's disease, unspecified: Secondary | ICD-10-CM | POA: Diagnosis not present

## 2022-02-22 DIAGNOSIS — I252 Old myocardial infarction: Secondary | ICD-10-CM | POA: Diagnosis not present

## 2022-02-22 DIAGNOSIS — R296 Repeated falls: Secondary | ICD-10-CM | POA: Diagnosis not present

## 2022-02-22 DIAGNOSIS — F02811 Dementia in other diseases classified elsewhere, unspecified severity, with agitation: Secondary | ICD-10-CM | POA: Diagnosis not present

## 2022-02-22 DIAGNOSIS — Z8616 Personal history of COVID-19: Secondary | ICD-10-CM | POA: Diagnosis not present

## 2022-02-24 DIAGNOSIS — Z8616 Personal history of COVID-19: Secondary | ICD-10-CM | POA: Diagnosis not present

## 2022-02-24 DIAGNOSIS — L821 Other seborrheic keratosis: Secondary | ICD-10-CM | POA: Diagnosis not present

## 2022-02-24 DIAGNOSIS — G309 Alzheimer's disease, unspecified: Secondary | ICD-10-CM | POA: Diagnosis not present

## 2022-02-24 DIAGNOSIS — R296 Repeated falls: Secondary | ICD-10-CM | POA: Diagnosis not present

## 2022-02-24 DIAGNOSIS — F02811 Dementia in other diseases classified elsewhere, unspecified severity, with agitation: Secondary | ICD-10-CM | POA: Diagnosis not present

## 2022-02-24 DIAGNOSIS — S81812D Laceration without foreign body, left lower leg, subsequent encounter: Secondary | ICD-10-CM | POA: Diagnosis not present

## 2022-02-24 DIAGNOSIS — S80811A Abrasion, right lower leg, initial encounter: Secondary | ICD-10-CM | POA: Diagnosis not present

## 2022-02-24 DIAGNOSIS — I252 Old myocardial infarction: Secondary | ICD-10-CM | POA: Diagnosis not present

## 2022-02-24 DIAGNOSIS — L22 Diaper dermatitis: Secondary | ICD-10-CM | POA: Diagnosis not present

## 2022-02-24 DIAGNOSIS — I2510 Atherosclerotic heart disease of native coronary artery without angina pectoris: Secondary | ICD-10-CM | POA: Diagnosis not present

## 2022-02-26 DIAGNOSIS — Z8616 Personal history of COVID-19: Secondary | ICD-10-CM | POA: Diagnosis not present

## 2022-02-26 DIAGNOSIS — I252 Old myocardial infarction: Secondary | ICD-10-CM | POA: Diagnosis not present

## 2022-02-26 DIAGNOSIS — I2510 Atherosclerotic heart disease of native coronary artery without angina pectoris: Secondary | ICD-10-CM | POA: Diagnosis not present

## 2022-02-26 DIAGNOSIS — G309 Alzheimer's disease, unspecified: Secondary | ICD-10-CM | POA: Diagnosis not present

## 2022-02-26 DIAGNOSIS — F02811 Dementia in other diseases classified elsewhere, unspecified severity, with agitation: Secondary | ICD-10-CM | POA: Diagnosis not present

## 2022-02-26 DIAGNOSIS — R296 Repeated falls: Secondary | ICD-10-CM | POA: Diagnosis not present

## 2022-03-01 DIAGNOSIS — G309 Alzheimer's disease, unspecified: Secondary | ICD-10-CM | POA: Diagnosis not present

## 2022-03-01 DIAGNOSIS — R296 Repeated falls: Secondary | ICD-10-CM | POA: Diagnosis not present

## 2022-03-01 DIAGNOSIS — Z8616 Personal history of COVID-19: Secondary | ICD-10-CM | POA: Diagnosis not present

## 2022-03-01 DIAGNOSIS — I2510 Atherosclerotic heart disease of native coronary artery without angina pectoris: Secondary | ICD-10-CM | POA: Diagnosis not present

## 2022-03-01 DIAGNOSIS — F02811 Dementia in other diseases classified elsewhere, unspecified severity, with agitation: Secondary | ICD-10-CM | POA: Diagnosis not present

## 2022-03-01 DIAGNOSIS — I252 Old myocardial infarction: Secondary | ICD-10-CM | POA: Diagnosis not present

## 2022-03-03 DIAGNOSIS — F02811 Dementia in other diseases classified elsewhere, unspecified severity, with agitation: Secondary | ICD-10-CM | POA: Diagnosis not present

## 2022-03-03 DIAGNOSIS — I2510 Atherosclerotic heart disease of native coronary artery without angina pectoris: Secondary | ICD-10-CM | POA: Diagnosis not present

## 2022-03-03 DIAGNOSIS — R296 Repeated falls: Secondary | ICD-10-CM | POA: Diagnosis not present

## 2022-03-03 DIAGNOSIS — I252 Old myocardial infarction: Secondary | ICD-10-CM | POA: Diagnosis not present

## 2022-03-03 DIAGNOSIS — G309 Alzheimer's disease, unspecified: Secondary | ICD-10-CM | POA: Diagnosis not present

## 2022-03-03 DIAGNOSIS — Z8616 Personal history of COVID-19: Secondary | ICD-10-CM | POA: Diagnosis not present

## 2022-03-04 DIAGNOSIS — R32 Unspecified urinary incontinence: Secondary | ICD-10-CM | POA: Diagnosis not present

## 2022-03-04 DIAGNOSIS — Z8616 Personal history of COVID-19: Secondary | ICD-10-CM | POA: Diagnosis not present

## 2022-03-04 DIAGNOSIS — M1990 Unspecified osteoarthritis, unspecified site: Secondary | ICD-10-CM | POA: Diagnosis not present

## 2022-03-04 DIAGNOSIS — I255 Ischemic cardiomyopathy: Secondary | ICD-10-CM | POA: Diagnosis not present

## 2022-03-04 DIAGNOSIS — I10 Essential (primary) hypertension: Secondary | ICD-10-CM | POA: Diagnosis not present

## 2022-03-04 DIAGNOSIS — R159 Full incontinence of feces: Secondary | ICD-10-CM | POA: Diagnosis not present

## 2022-03-04 DIAGNOSIS — K219 Gastro-esophageal reflux disease without esophagitis: Secondary | ICD-10-CM | POA: Diagnosis not present

## 2022-03-04 DIAGNOSIS — Z741 Need for assistance with personal care: Secondary | ICD-10-CM | POA: Diagnosis not present

## 2022-03-04 DIAGNOSIS — L821 Other seborrheic keratosis: Secondary | ICD-10-CM | POA: Diagnosis not present

## 2022-03-04 DIAGNOSIS — R296 Repeated falls: Secondary | ICD-10-CM | POA: Diagnosis not present

## 2022-03-04 DIAGNOSIS — H109 Unspecified conjunctivitis: Secondary | ICD-10-CM | POA: Diagnosis not present

## 2022-03-04 DIAGNOSIS — I252 Old myocardial infarction: Secondary | ICD-10-CM | POA: Diagnosis not present

## 2022-03-04 DIAGNOSIS — Z6820 Body mass index (BMI) 20.0-20.9, adult: Secondary | ICD-10-CM | POA: Diagnosis not present

## 2022-03-04 DIAGNOSIS — I2510 Atherosclerotic heart disease of native coronary artery without angina pectoris: Secondary | ICD-10-CM | POA: Diagnosis not present

## 2022-03-04 DIAGNOSIS — Z8679 Personal history of other diseases of the circulatory system: Secondary | ICD-10-CM | POA: Diagnosis not present

## 2022-03-04 DIAGNOSIS — F02811 Dementia in other diseases classified elsewhere, unspecified severity, with agitation: Secondary | ICD-10-CM | POA: Diagnosis not present

## 2022-03-04 DIAGNOSIS — N400 Enlarged prostate without lower urinary tract symptoms: Secondary | ICD-10-CM | POA: Diagnosis not present

## 2022-03-04 DIAGNOSIS — G309 Alzheimer's disease, unspecified: Secondary | ICD-10-CM | POA: Diagnosis not present

## 2022-03-04 DIAGNOSIS — H04129 Dry eye syndrome of unspecified lacrimal gland: Secondary | ICD-10-CM | POA: Diagnosis not present

## 2022-03-05 DIAGNOSIS — R296 Repeated falls: Secondary | ICD-10-CM | POA: Diagnosis not present

## 2022-03-05 DIAGNOSIS — I2510 Atherosclerotic heart disease of native coronary artery without angina pectoris: Secondary | ICD-10-CM | POA: Diagnosis not present

## 2022-03-05 DIAGNOSIS — G309 Alzheimer's disease, unspecified: Secondary | ICD-10-CM | POA: Diagnosis not present

## 2022-03-05 DIAGNOSIS — I252 Old myocardial infarction: Secondary | ICD-10-CM | POA: Diagnosis not present

## 2022-03-05 DIAGNOSIS — Z8616 Personal history of COVID-19: Secondary | ICD-10-CM | POA: Diagnosis not present

## 2022-03-05 DIAGNOSIS — F02811 Dementia in other diseases classified elsewhere, unspecified severity, with agitation: Secondary | ICD-10-CM | POA: Diagnosis not present

## 2022-03-08 DIAGNOSIS — I252 Old myocardial infarction: Secondary | ICD-10-CM | POA: Diagnosis not present

## 2022-03-08 DIAGNOSIS — I2510 Atherosclerotic heart disease of native coronary artery without angina pectoris: Secondary | ICD-10-CM | POA: Diagnosis not present

## 2022-03-08 DIAGNOSIS — R296 Repeated falls: Secondary | ICD-10-CM | POA: Diagnosis not present

## 2022-03-08 DIAGNOSIS — G309 Alzheimer's disease, unspecified: Secondary | ICD-10-CM | POA: Diagnosis not present

## 2022-03-08 DIAGNOSIS — F02811 Dementia in other diseases classified elsewhere, unspecified severity, with agitation: Secondary | ICD-10-CM | POA: Diagnosis not present

## 2022-03-08 DIAGNOSIS — Z8616 Personal history of COVID-19: Secondary | ICD-10-CM | POA: Diagnosis not present

## 2022-03-09 DIAGNOSIS — I252 Old myocardial infarction: Secondary | ICD-10-CM | POA: Diagnosis not present

## 2022-03-09 DIAGNOSIS — G309 Alzheimer's disease, unspecified: Secondary | ICD-10-CM | POA: Diagnosis not present

## 2022-03-09 DIAGNOSIS — R627 Adult failure to thrive: Secondary | ICD-10-CM | POA: Diagnosis not present

## 2022-03-09 DIAGNOSIS — I2510 Atherosclerotic heart disease of native coronary artery without angina pectoris: Secondary | ICD-10-CM | POA: Diagnosis not present

## 2022-03-09 DIAGNOSIS — F0390 Unspecified dementia without behavioral disturbance: Secondary | ICD-10-CM | POA: Diagnosis not present

## 2022-03-09 DIAGNOSIS — F02811 Dementia in other diseases classified elsewhere, unspecified severity, with agitation: Secondary | ICD-10-CM | POA: Diagnosis not present

## 2022-03-09 DIAGNOSIS — Z8616 Personal history of COVID-19: Secondary | ICD-10-CM | POA: Diagnosis not present

## 2022-03-09 DIAGNOSIS — R630 Anorexia: Secondary | ICD-10-CM | POA: Diagnosis not present

## 2022-03-09 DIAGNOSIS — R296 Repeated falls: Secondary | ICD-10-CM | POA: Diagnosis not present

## 2022-03-10 DIAGNOSIS — Z8616 Personal history of COVID-19: Secondary | ICD-10-CM | POA: Diagnosis not present

## 2022-03-10 DIAGNOSIS — G309 Alzheimer's disease, unspecified: Secondary | ICD-10-CM | POA: Diagnosis not present

## 2022-03-10 DIAGNOSIS — R296 Repeated falls: Secondary | ICD-10-CM | POA: Diagnosis not present

## 2022-03-10 DIAGNOSIS — I2510 Atherosclerotic heart disease of native coronary artery without angina pectoris: Secondary | ICD-10-CM | POA: Diagnosis not present

## 2022-03-10 DIAGNOSIS — I252 Old myocardial infarction: Secondary | ICD-10-CM | POA: Diagnosis not present

## 2022-03-10 DIAGNOSIS — F02811 Dementia in other diseases classified elsewhere, unspecified severity, with agitation: Secondary | ICD-10-CM | POA: Diagnosis not present

## 2022-03-12 DIAGNOSIS — I252 Old myocardial infarction: Secondary | ICD-10-CM | POA: Diagnosis not present

## 2022-03-12 DIAGNOSIS — Z8616 Personal history of COVID-19: Secondary | ICD-10-CM | POA: Diagnosis not present

## 2022-03-12 DIAGNOSIS — R296 Repeated falls: Secondary | ICD-10-CM | POA: Diagnosis not present

## 2022-03-12 DIAGNOSIS — F02811 Dementia in other diseases classified elsewhere, unspecified severity, with agitation: Secondary | ICD-10-CM | POA: Diagnosis not present

## 2022-03-12 DIAGNOSIS — I2510 Atherosclerotic heart disease of native coronary artery without angina pectoris: Secondary | ICD-10-CM | POA: Diagnosis not present

## 2022-03-12 DIAGNOSIS — G309 Alzheimer's disease, unspecified: Secondary | ICD-10-CM | POA: Diagnosis not present

## 2022-03-15 DIAGNOSIS — G309 Alzheimer's disease, unspecified: Secondary | ICD-10-CM | POA: Diagnosis not present

## 2022-03-15 DIAGNOSIS — R296 Repeated falls: Secondary | ICD-10-CM | POA: Diagnosis not present

## 2022-03-15 DIAGNOSIS — I2510 Atherosclerotic heart disease of native coronary artery without angina pectoris: Secondary | ICD-10-CM | POA: Diagnosis not present

## 2022-03-15 DIAGNOSIS — F02811 Dementia in other diseases classified elsewhere, unspecified severity, with agitation: Secondary | ICD-10-CM | POA: Diagnosis not present

## 2022-03-15 DIAGNOSIS — I252 Old myocardial infarction: Secondary | ICD-10-CM | POA: Diagnosis not present

## 2022-03-15 DIAGNOSIS — Z8616 Personal history of COVID-19: Secondary | ICD-10-CM | POA: Diagnosis not present

## 2022-03-17 DIAGNOSIS — G309 Alzheimer's disease, unspecified: Secondary | ICD-10-CM | POA: Diagnosis not present

## 2022-03-17 DIAGNOSIS — F02811 Dementia in other diseases classified elsewhere, unspecified severity, with agitation: Secondary | ICD-10-CM | POA: Diagnosis not present

## 2022-03-17 DIAGNOSIS — L821 Other seborrheic keratosis: Secondary | ICD-10-CM | POA: Diagnosis not present

## 2022-03-17 DIAGNOSIS — Z8616 Personal history of COVID-19: Secondary | ICD-10-CM | POA: Diagnosis not present

## 2022-03-17 DIAGNOSIS — I2510 Atherosclerotic heart disease of native coronary artery without angina pectoris: Secondary | ICD-10-CM | POA: Diagnosis not present

## 2022-03-17 DIAGNOSIS — S81812D Laceration without foreign body, left lower leg, subsequent encounter: Secondary | ICD-10-CM | POA: Diagnosis not present

## 2022-03-17 DIAGNOSIS — R296 Repeated falls: Secondary | ICD-10-CM | POA: Diagnosis not present

## 2022-03-17 DIAGNOSIS — I252 Old myocardial infarction: Secondary | ICD-10-CM | POA: Diagnosis not present

## 2022-03-17 DIAGNOSIS — S80811A Abrasion, right lower leg, initial encounter: Secondary | ICD-10-CM | POA: Diagnosis not present

## 2022-03-17 DIAGNOSIS — L22 Diaper dermatitis: Secondary | ICD-10-CM | POA: Diagnosis not present

## 2022-03-19 DIAGNOSIS — I252 Old myocardial infarction: Secondary | ICD-10-CM | POA: Diagnosis not present

## 2022-03-19 DIAGNOSIS — Z8616 Personal history of COVID-19: Secondary | ICD-10-CM | POA: Diagnosis not present

## 2022-03-19 DIAGNOSIS — F02811 Dementia in other diseases classified elsewhere, unspecified severity, with agitation: Secondary | ICD-10-CM | POA: Diagnosis not present

## 2022-03-19 DIAGNOSIS — G309 Alzheimer's disease, unspecified: Secondary | ICD-10-CM | POA: Diagnosis not present

## 2022-03-19 DIAGNOSIS — R296 Repeated falls: Secondary | ICD-10-CM | POA: Diagnosis not present

## 2022-03-19 DIAGNOSIS — I2510 Atherosclerotic heart disease of native coronary artery without angina pectoris: Secondary | ICD-10-CM | POA: Diagnosis not present

## 2022-03-22 DIAGNOSIS — I252 Old myocardial infarction: Secondary | ICD-10-CM | POA: Diagnosis not present

## 2022-03-22 DIAGNOSIS — F02811 Dementia in other diseases classified elsewhere, unspecified severity, with agitation: Secondary | ICD-10-CM | POA: Diagnosis not present

## 2022-03-22 DIAGNOSIS — R296 Repeated falls: Secondary | ICD-10-CM | POA: Diagnosis not present

## 2022-03-22 DIAGNOSIS — Z8616 Personal history of COVID-19: Secondary | ICD-10-CM | POA: Diagnosis not present

## 2022-03-22 DIAGNOSIS — I2510 Atherosclerotic heart disease of native coronary artery without angina pectoris: Secondary | ICD-10-CM | POA: Diagnosis not present

## 2022-03-22 DIAGNOSIS — G309 Alzheimer's disease, unspecified: Secondary | ICD-10-CM | POA: Diagnosis not present

## 2022-03-24 DIAGNOSIS — I2510 Atherosclerotic heart disease of native coronary artery without angina pectoris: Secondary | ICD-10-CM | POA: Diagnosis not present

## 2022-03-24 DIAGNOSIS — S80811A Abrasion, right lower leg, initial encounter: Secondary | ICD-10-CM | POA: Diagnosis not present

## 2022-03-24 DIAGNOSIS — G309 Alzheimer's disease, unspecified: Secondary | ICD-10-CM | POA: Diagnosis not present

## 2022-03-24 DIAGNOSIS — F02811 Dementia in other diseases classified elsewhere, unspecified severity, with agitation: Secondary | ICD-10-CM | POA: Diagnosis not present

## 2022-03-24 DIAGNOSIS — R296 Repeated falls: Secondary | ICD-10-CM | POA: Diagnosis not present

## 2022-03-24 DIAGNOSIS — L821 Other seborrheic keratosis: Secondary | ICD-10-CM | POA: Diagnosis not present

## 2022-03-24 DIAGNOSIS — Z8616 Personal history of COVID-19: Secondary | ICD-10-CM | POA: Diagnosis not present

## 2022-03-24 DIAGNOSIS — L22 Diaper dermatitis: Secondary | ICD-10-CM | POA: Diagnosis not present

## 2022-03-24 DIAGNOSIS — I252 Old myocardial infarction: Secondary | ICD-10-CM | POA: Diagnosis not present

## 2022-03-24 DIAGNOSIS — S81812D Laceration without foreign body, left lower leg, subsequent encounter: Secondary | ICD-10-CM | POA: Diagnosis not present

## 2022-03-25 DIAGNOSIS — I252 Old myocardial infarction: Secondary | ICD-10-CM | POA: Diagnosis not present

## 2022-03-25 DIAGNOSIS — F02811 Dementia in other diseases classified elsewhere, unspecified severity, with agitation: Secondary | ICD-10-CM | POA: Diagnosis not present

## 2022-03-25 DIAGNOSIS — R296 Repeated falls: Secondary | ICD-10-CM | POA: Diagnosis not present

## 2022-03-25 DIAGNOSIS — I2510 Atherosclerotic heart disease of native coronary artery without angina pectoris: Secondary | ICD-10-CM | POA: Diagnosis not present

## 2022-03-25 DIAGNOSIS — Z8616 Personal history of COVID-19: Secondary | ICD-10-CM | POA: Diagnosis not present

## 2022-03-25 DIAGNOSIS — G309 Alzheimer's disease, unspecified: Secondary | ICD-10-CM | POA: Diagnosis not present

## 2022-03-26 DIAGNOSIS — I2510 Atherosclerotic heart disease of native coronary artery without angina pectoris: Secondary | ICD-10-CM | POA: Diagnosis not present

## 2022-03-26 DIAGNOSIS — F02811 Dementia in other diseases classified elsewhere, unspecified severity, with agitation: Secondary | ICD-10-CM | POA: Diagnosis not present

## 2022-03-26 DIAGNOSIS — R296 Repeated falls: Secondary | ICD-10-CM | POA: Diagnosis not present

## 2022-03-26 DIAGNOSIS — Z8616 Personal history of COVID-19: Secondary | ICD-10-CM | POA: Diagnosis not present

## 2022-03-26 DIAGNOSIS — I252 Old myocardial infarction: Secondary | ICD-10-CM | POA: Diagnosis not present

## 2022-03-26 DIAGNOSIS — G309 Alzheimer's disease, unspecified: Secondary | ICD-10-CM | POA: Diagnosis not present

## 2022-03-28 DIAGNOSIS — R296 Repeated falls: Secondary | ICD-10-CM | POA: Diagnosis not present

## 2022-03-28 DIAGNOSIS — I2510 Atherosclerotic heart disease of native coronary artery without angina pectoris: Secondary | ICD-10-CM | POA: Diagnosis not present

## 2022-03-28 DIAGNOSIS — Z8616 Personal history of COVID-19: Secondary | ICD-10-CM | POA: Diagnosis not present

## 2022-03-28 DIAGNOSIS — G309 Alzheimer's disease, unspecified: Secondary | ICD-10-CM | POA: Diagnosis not present

## 2022-03-28 DIAGNOSIS — I252 Old myocardial infarction: Secondary | ICD-10-CM | POA: Diagnosis not present

## 2022-03-28 DIAGNOSIS — F02811 Dementia in other diseases classified elsewhere, unspecified severity, with agitation: Secondary | ICD-10-CM | POA: Diagnosis not present

## 2022-03-29 DIAGNOSIS — R627 Adult failure to thrive: Secondary | ICD-10-CM | POA: Diagnosis not present

## 2022-03-29 DIAGNOSIS — L89312 Pressure ulcer of right buttock, stage 2: Secondary | ICD-10-CM | POA: Diagnosis not present

## 2022-03-29 DIAGNOSIS — F0390 Unspecified dementia without behavioral disturbance: Secondary | ICD-10-CM | POA: Diagnosis not present

## 2022-03-30 DIAGNOSIS — L821 Other seborrheic keratosis: Secondary | ICD-10-CM | POA: Diagnosis not present

## 2022-03-30 DIAGNOSIS — S80811A Abrasion, right lower leg, initial encounter: Secondary | ICD-10-CM | POA: Diagnosis not present

## 2022-03-30 DIAGNOSIS — S81812D Laceration without foreign body, left lower leg, subsequent encounter: Secondary | ICD-10-CM | POA: Diagnosis not present

## 2022-03-30 DIAGNOSIS — L22 Diaper dermatitis: Secondary | ICD-10-CM | POA: Diagnosis not present

## 2022-03-31 DIAGNOSIS — Z8616 Personal history of COVID-19: Secondary | ICD-10-CM | POA: Diagnosis not present

## 2022-03-31 DIAGNOSIS — F02811 Dementia in other diseases classified elsewhere, unspecified severity, with agitation: Secondary | ICD-10-CM | POA: Diagnosis not present

## 2022-03-31 DIAGNOSIS — R296 Repeated falls: Secondary | ICD-10-CM | POA: Diagnosis not present

## 2022-03-31 DIAGNOSIS — I252 Old myocardial infarction: Secondary | ICD-10-CM | POA: Diagnosis not present

## 2022-03-31 DIAGNOSIS — I2510 Atherosclerotic heart disease of native coronary artery without angina pectoris: Secondary | ICD-10-CM | POA: Diagnosis not present

## 2022-03-31 DIAGNOSIS — G309 Alzheimer's disease, unspecified: Secondary | ICD-10-CM | POA: Diagnosis not present

## 2022-04-02 DIAGNOSIS — Z741 Need for assistance with personal care: Secondary | ICD-10-CM | POA: Diagnosis not present

## 2022-04-02 DIAGNOSIS — H04129 Dry eye syndrome of unspecified lacrimal gland: Secondary | ICD-10-CM | POA: Diagnosis not present

## 2022-04-02 DIAGNOSIS — Z8679 Personal history of other diseases of the circulatory system: Secondary | ICD-10-CM | POA: Diagnosis not present

## 2022-04-02 DIAGNOSIS — I252 Old myocardial infarction: Secondary | ICD-10-CM | POA: Diagnosis not present

## 2022-04-02 DIAGNOSIS — H109 Unspecified conjunctivitis: Secondary | ICD-10-CM | POA: Diagnosis not present

## 2022-04-02 DIAGNOSIS — R296 Repeated falls: Secondary | ICD-10-CM | POA: Diagnosis not present

## 2022-04-02 DIAGNOSIS — I2510 Atherosclerotic heart disease of native coronary artery without angina pectoris: Secondary | ICD-10-CM | POA: Diagnosis not present

## 2022-04-02 DIAGNOSIS — M1990 Unspecified osteoarthritis, unspecified site: Secondary | ICD-10-CM | POA: Diagnosis not present

## 2022-04-02 DIAGNOSIS — I10 Essential (primary) hypertension: Secondary | ICD-10-CM | POA: Diagnosis not present

## 2022-04-02 DIAGNOSIS — G309 Alzheimer's disease, unspecified: Secondary | ICD-10-CM | POA: Diagnosis not present

## 2022-04-02 DIAGNOSIS — N400 Enlarged prostate without lower urinary tract symptoms: Secondary | ICD-10-CM | POA: Diagnosis not present

## 2022-04-02 DIAGNOSIS — I255 Ischemic cardiomyopathy: Secondary | ICD-10-CM | POA: Diagnosis not present

## 2022-04-02 DIAGNOSIS — F02811 Dementia in other diseases classified elsewhere, unspecified severity, with agitation: Secondary | ICD-10-CM | POA: Diagnosis not present

## 2022-04-02 DIAGNOSIS — R32 Unspecified urinary incontinence: Secondary | ICD-10-CM | POA: Diagnosis not present

## 2022-04-02 DIAGNOSIS — Z6820 Body mass index (BMI) 20.0-20.9, adult: Secondary | ICD-10-CM | POA: Diagnosis not present

## 2022-04-02 DIAGNOSIS — K219 Gastro-esophageal reflux disease without esophagitis: Secondary | ICD-10-CM | POA: Diagnosis not present

## 2022-04-02 DIAGNOSIS — L821 Other seborrheic keratosis: Secondary | ICD-10-CM | POA: Diagnosis not present

## 2022-04-02 DIAGNOSIS — R159 Full incontinence of feces: Secondary | ICD-10-CM | POA: Diagnosis not present

## 2022-04-02 DIAGNOSIS — Z8616 Personal history of COVID-19: Secondary | ICD-10-CM | POA: Diagnosis not present

## 2022-04-05 DIAGNOSIS — G309 Alzheimer's disease, unspecified: Secondary | ICD-10-CM | POA: Diagnosis not present

## 2022-04-05 DIAGNOSIS — I2510 Atherosclerotic heart disease of native coronary artery without angina pectoris: Secondary | ICD-10-CM | POA: Diagnosis not present

## 2022-04-05 DIAGNOSIS — F02811 Dementia in other diseases classified elsewhere, unspecified severity, with agitation: Secondary | ICD-10-CM | POA: Diagnosis not present

## 2022-04-05 DIAGNOSIS — R296 Repeated falls: Secondary | ICD-10-CM | POA: Diagnosis not present

## 2022-04-05 DIAGNOSIS — I252 Old myocardial infarction: Secondary | ICD-10-CM | POA: Diagnosis not present

## 2022-04-05 DIAGNOSIS — Z8616 Personal history of COVID-19: Secondary | ICD-10-CM | POA: Diagnosis not present

## 2022-04-06 DIAGNOSIS — S80811A Abrasion, right lower leg, initial encounter: Secondary | ICD-10-CM | POA: Diagnosis not present

## 2022-04-06 DIAGNOSIS — L539 Erythematous condition, unspecified: Secondary | ICD-10-CM | POA: Diagnosis not present

## 2022-04-06 DIAGNOSIS — L821 Other seborrheic keratosis: Secondary | ICD-10-CM | POA: Diagnosis not present

## 2022-04-06 DIAGNOSIS — L22 Diaper dermatitis: Secondary | ICD-10-CM | POA: Diagnosis not present

## 2022-04-07 DIAGNOSIS — R296 Repeated falls: Secondary | ICD-10-CM | POA: Diagnosis not present

## 2022-04-07 DIAGNOSIS — I252 Old myocardial infarction: Secondary | ICD-10-CM | POA: Diagnosis not present

## 2022-04-07 DIAGNOSIS — Z8616 Personal history of COVID-19: Secondary | ICD-10-CM | POA: Diagnosis not present

## 2022-04-07 DIAGNOSIS — I2510 Atherosclerotic heart disease of native coronary artery without angina pectoris: Secondary | ICD-10-CM | POA: Diagnosis not present

## 2022-04-07 DIAGNOSIS — F02811 Dementia in other diseases classified elsewhere, unspecified severity, with agitation: Secondary | ICD-10-CM | POA: Diagnosis not present

## 2022-04-07 DIAGNOSIS — G309 Alzheimer's disease, unspecified: Secondary | ICD-10-CM | POA: Diagnosis not present

## 2022-04-08 DIAGNOSIS — I2510 Atherosclerotic heart disease of native coronary artery without angina pectoris: Secondary | ICD-10-CM | POA: Diagnosis not present

## 2022-04-08 DIAGNOSIS — I252 Old myocardial infarction: Secondary | ICD-10-CM | POA: Diagnosis not present

## 2022-04-08 DIAGNOSIS — G309 Alzheimer's disease, unspecified: Secondary | ICD-10-CM | POA: Diagnosis not present

## 2022-04-08 DIAGNOSIS — R296 Repeated falls: Secondary | ICD-10-CM | POA: Diagnosis not present

## 2022-04-08 DIAGNOSIS — F02811 Dementia in other diseases classified elsewhere, unspecified severity, with agitation: Secondary | ICD-10-CM | POA: Diagnosis not present

## 2022-04-08 DIAGNOSIS — Z8616 Personal history of COVID-19: Secondary | ICD-10-CM | POA: Diagnosis not present

## 2022-04-09 DIAGNOSIS — R296 Repeated falls: Secondary | ICD-10-CM | POA: Diagnosis not present

## 2022-04-09 DIAGNOSIS — F02811 Dementia in other diseases classified elsewhere, unspecified severity, with agitation: Secondary | ICD-10-CM | POA: Diagnosis not present

## 2022-04-09 DIAGNOSIS — I252 Old myocardial infarction: Secondary | ICD-10-CM | POA: Diagnosis not present

## 2022-04-09 DIAGNOSIS — G309 Alzheimer's disease, unspecified: Secondary | ICD-10-CM | POA: Diagnosis not present

## 2022-04-09 DIAGNOSIS — Z8616 Personal history of COVID-19: Secondary | ICD-10-CM | POA: Diagnosis not present

## 2022-04-09 DIAGNOSIS — I2510 Atherosclerotic heart disease of native coronary artery without angina pectoris: Secondary | ICD-10-CM | POA: Diagnosis not present

## 2022-04-12 DIAGNOSIS — I252 Old myocardial infarction: Secondary | ICD-10-CM | POA: Diagnosis not present

## 2022-04-12 DIAGNOSIS — Z8616 Personal history of COVID-19: Secondary | ICD-10-CM | POA: Diagnosis not present

## 2022-04-12 DIAGNOSIS — G309 Alzheimer's disease, unspecified: Secondary | ICD-10-CM | POA: Diagnosis not present

## 2022-04-12 DIAGNOSIS — R296 Repeated falls: Secondary | ICD-10-CM | POA: Diagnosis not present

## 2022-04-12 DIAGNOSIS — I2510 Atherosclerotic heart disease of native coronary artery without angina pectoris: Secondary | ICD-10-CM | POA: Diagnosis not present

## 2022-04-12 DIAGNOSIS — F02811 Dementia in other diseases classified elsewhere, unspecified severity, with agitation: Secondary | ICD-10-CM | POA: Diagnosis not present

## 2022-04-14 DIAGNOSIS — I2510 Atherosclerotic heart disease of native coronary artery without angina pectoris: Secondary | ICD-10-CM | POA: Diagnosis not present

## 2022-04-14 DIAGNOSIS — G309 Alzheimer's disease, unspecified: Secondary | ICD-10-CM | POA: Diagnosis not present

## 2022-04-14 DIAGNOSIS — I252 Old myocardial infarction: Secondary | ICD-10-CM | POA: Diagnosis not present

## 2022-04-14 DIAGNOSIS — F02811 Dementia in other diseases classified elsewhere, unspecified severity, with agitation: Secondary | ICD-10-CM | POA: Diagnosis not present

## 2022-04-14 DIAGNOSIS — R296 Repeated falls: Secondary | ICD-10-CM | POA: Diagnosis not present

## 2022-04-14 DIAGNOSIS — Z8616 Personal history of COVID-19: Secondary | ICD-10-CM | POA: Diagnosis not present

## 2022-04-15 DIAGNOSIS — L821 Other seborrheic keratosis: Secondary | ICD-10-CM | POA: Diagnosis not present

## 2022-04-15 DIAGNOSIS — L22 Diaper dermatitis: Secondary | ICD-10-CM | POA: Diagnosis not present

## 2022-04-15 DIAGNOSIS — S80811A Abrasion, right lower leg, initial encounter: Secondary | ICD-10-CM | POA: Diagnosis not present

## 2022-04-15 DIAGNOSIS — L539 Erythematous condition, unspecified: Secondary | ICD-10-CM | POA: Diagnosis not present

## 2022-04-16 DIAGNOSIS — Z8616 Personal history of COVID-19: Secondary | ICD-10-CM | POA: Diagnosis not present

## 2022-04-16 DIAGNOSIS — R296 Repeated falls: Secondary | ICD-10-CM | POA: Diagnosis not present

## 2022-04-16 DIAGNOSIS — I252 Old myocardial infarction: Secondary | ICD-10-CM | POA: Diagnosis not present

## 2022-04-16 DIAGNOSIS — I2510 Atherosclerotic heart disease of native coronary artery without angina pectoris: Secondary | ICD-10-CM | POA: Diagnosis not present

## 2022-04-16 DIAGNOSIS — F02811 Dementia in other diseases classified elsewhere, unspecified severity, with agitation: Secondary | ICD-10-CM | POA: Diagnosis not present

## 2022-04-16 DIAGNOSIS — G309 Alzheimer's disease, unspecified: Secondary | ICD-10-CM | POA: Diagnosis not present

## 2022-04-19 DIAGNOSIS — G309 Alzheimer's disease, unspecified: Secondary | ICD-10-CM | POA: Diagnosis not present

## 2022-04-19 DIAGNOSIS — Z8616 Personal history of COVID-19: Secondary | ICD-10-CM | POA: Diagnosis not present

## 2022-04-19 DIAGNOSIS — R296 Repeated falls: Secondary | ICD-10-CM | POA: Diagnosis not present

## 2022-04-19 DIAGNOSIS — I252 Old myocardial infarction: Secondary | ICD-10-CM | POA: Diagnosis not present

## 2022-04-19 DIAGNOSIS — I2510 Atherosclerotic heart disease of native coronary artery without angina pectoris: Secondary | ICD-10-CM | POA: Diagnosis not present

## 2022-04-19 DIAGNOSIS — F02811 Dementia in other diseases classified elsewhere, unspecified severity, with agitation: Secondary | ICD-10-CM | POA: Diagnosis not present

## 2022-04-21 DIAGNOSIS — Z8616 Personal history of COVID-19: Secondary | ICD-10-CM | POA: Diagnosis not present

## 2022-04-21 DIAGNOSIS — I252 Old myocardial infarction: Secondary | ICD-10-CM | POA: Diagnosis not present

## 2022-04-21 DIAGNOSIS — F02811 Dementia in other diseases classified elsewhere, unspecified severity, with agitation: Secondary | ICD-10-CM | POA: Diagnosis not present

## 2022-04-21 DIAGNOSIS — I2510 Atherosclerotic heart disease of native coronary artery without angina pectoris: Secondary | ICD-10-CM | POA: Diagnosis not present

## 2022-04-21 DIAGNOSIS — R296 Repeated falls: Secondary | ICD-10-CM | POA: Diagnosis not present

## 2022-04-21 DIAGNOSIS — G309 Alzheimer's disease, unspecified: Secondary | ICD-10-CM | POA: Diagnosis not present

## 2022-04-22 DIAGNOSIS — F02811 Dementia in other diseases classified elsewhere, unspecified severity, with agitation: Secondary | ICD-10-CM | POA: Diagnosis not present

## 2022-04-22 DIAGNOSIS — Z8616 Personal history of COVID-19: Secondary | ICD-10-CM | POA: Diagnosis not present

## 2022-04-22 DIAGNOSIS — I252 Old myocardial infarction: Secondary | ICD-10-CM | POA: Diagnosis not present

## 2022-04-22 DIAGNOSIS — I2510 Atherosclerotic heart disease of native coronary artery without angina pectoris: Secondary | ICD-10-CM | POA: Diagnosis not present

## 2022-04-22 DIAGNOSIS — R296 Repeated falls: Secondary | ICD-10-CM | POA: Diagnosis not present

## 2022-04-22 DIAGNOSIS — G309 Alzheimer's disease, unspecified: Secondary | ICD-10-CM | POA: Diagnosis not present

## 2022-04-26 DIAGNOSIS — F02811 Dementia in other diseases classified elsewhere, unspecified severity, with agitation: Secondary | ICD-10-CM | POA: Diagnosis not present

## 2022-04-26 DIAGNOSIS — I2510 Atherosclerotic heart disease of native coronary artery without angina pectoris: Secondary | ICD-10-CM | POA: Diagnosis not present

## 2022-04-26 DIAGNOSIS — Z8616 Personal history of COVID-19: Secondary | ICD-10-CM | POA: Diagnosis not present

## 2022-04-26 DIAGNOSIS — G309 Alzheimer's disease, unspecified: Secondary | ICD-10-CM | POA: Diagnosis not present

## 2022-04-26 DIAGNOSIS — R296 Repeated falls: Secondary | ICD-10-CM | POA: Diagnosis not present

## 2022-04-26 DIAGNOSIS — I252 Old myocardial infarction: Secondary | ICD-10-CM | POA: Diagnosis not present

## 2022-04-28 DIAGNOSIS — G309 Alzheimer's disease, unspecified: Secondary | ICD-10-CM | POA: Diagnosis not present

## 2022-04-28 DIAGNOSIS — F02811 Dementia in other diseases classified elsewhere, unspecified severity, with agitation: Secondary | ICD-10-CM | POA: Diagnosis not present

## 2022-04-28 DIAGNOSIS — I252 Old myocardial infarction: Secondary | ICD-10-CM | POA: Diagnosis not present

## 2022-04-28 DIAGNOSIS — Z8616 Personal history of COVID-19: Secondary | ICD-10-CM | POA: Diagnosis not present

## 2022-04-28 DIAGNOSIS — R296 Repeated falls: Secondary | ICD-10-CM | POA: Diagnosis not present

## 2022-04-28 DIAGNOSIS — I2510 Atherosclerotic heart disease of native coronary artery without angina pectoris: Secondary | ICD-10-CM | POA: Diagnosis not present

## 2022-04-30 DIAGNOSIS — G309 Alzheimer's disease, unspecified: Secondary | ICD-10-CM | POA: Diagnosis not present

## 2022-04-30 DIAGNOSIS — F02811 Dementia in other diseases classified elsewhere, unspecified severity, with agitation: Secondary | ICD-10-CM | POA: Diagnosis not present

## 2022-04-30 DIAGNOSIS — Z8616 Personal history of COVID-19: Secondary | ICD-10-CM | POA: Diagnosis not present

## 2022-04-30 DIAGNOSIS — I252 Old myocardial infarction: Secondary | ICD-10-CM | POA: Diagnosis not present

## 2022-04-30 DIAGNOSIS — R296 Repeated falls: Secondary | ICD-10-CM | POA: Diagnosis not present

## 2022-04-30 DIAGNOSIS — I2510 Atherosclerotic heart disease of native coronary artery without angina pectoris: Secondary | ICD-10-CM | POA: Diagnosis not present

## 2022-05-03 DIAGNOSIS — M1990 Unspecified osteoarthritis, unspecified site: Secondary | ICD-10-CM | POA: Diagnosis not present

## 2022-05-03 DIAGNOSIS — I10 Essential (primary) hypertension: Secondary | ICD-10-CM | POA: Diagnosis not present

## 2022-05-03 DIAGNOSIS — Z6820 Body mass index (BMI) 20.0-20.9, adult: Secondary | ICD-10-CM | POA: Diagnosis not present

## 2022-05-03 DIAGNOSIS — Z741 Need for assistance with personal care: Secondary | ICD-10-CM | POA: Diagnosis not present

## 2022-05-03 DIAGNOSIS — Z8679 Personal history of other diseases of the circulatory system: Secondary | ICD-10-CM | POA: Diagnosis not present

## 2022-05-03 DIAGNOSIS — I2510 Atherosclerotic heart disease of native coronary artery without angina pectoris: Secondary | ICD-10-CM | POA: Diagnosis not present

## 2022-05-03 DIAGNOSIS — I255 Ischemic cardiomyopathy: Secondary | ICD-10-CM | POA: Diagnosis not present

## 2022-05-03 DIAGNOSIS — I252 Old myocardial infarction: Secondary | ICD-10-CM | POA: Diagnosis not present

## 2022-05-03 DIAGNOSIS — R296 Repeated falls: Secondary | ICD-10-CM | POA: Diagnosis not present

## 2022-05-03 DIAGNOSIS — N400 Enlarged prostate without lower urinary tract symptoms: Secondary | ICD-10-CM | POA: Diagnosis not present

## 2022-05-03 DIAGNOSIS — H109 Unspecified conjunctivitis: Secondary | ICD-10-CM | POA: Diagnosis not present

## 2022-05-03 DIAGNOSIS — K219 Gastro-esophageal reflux disease without esophagitis: Secondary | ICD-10-CM | POA: Diagnosis not present

## 2022-05-03 DIAGNOSIS — F02811 Dementia in other diseases classified elsewhere, unspecified severity, with agitation: Secondary | ICD-10-CM | POA: Diagnosis not present

## 2022-05-03 DIAGNOSIS — H04129 Dry eye syndrome of unspecified lacrimal gland: Secondary | ICD-10-CM | POA: Diagnosis not present

## 2022-05-03 DIAGNOSIS — Z8616 Personal history of COVID-19: Secondary | ICD-10-CM | POA: Diagnosis not present

## 2022-05-03 DIAGNOSIS — L821 Other seborrheic keratosis: Secondary | ICD-10-CM | POA: Diagnosis not present

## 2022-05-03 DIAGNOSIS — G309 Alzheimer's disease, unspecified: Secondary | ICD-10-CM | POA: Diagnosis not present

## 2022-05-03 DIAGNOSIS — R159 Full incontinence of feces: Secondary | ICD-10-CM | POA: Diagnosis not present

## 2022-05-03 DIAGNOSIS — R32 Unspecified urinary incontinence: Secondary | ICD-10-CM | POA: Diagnosis not present

## 2022-05-05 DIAGNOSIS — R296 Repeated falls: Secondary | ICD-10-CM | POA: Diagnosis not present

## 2022-05-05 DIAGNOSIS — G309 Alzheimer's disease, unspecified: Secondary | ICD-10-CM | POA: Diagnosis not present

## 2022-05-05 DIAGNOSIS — I252 Old myocardial infarction: Secondary | ICD-10-CM | POA: Diagnosis not present

## 2022-05-05 DIAGNOSIS — F02811 Dementia in other diseases classified elsewhere, unspecified severity, with agitation: Secondary | ICD-10-CM | POA: Diagnosis not present

## 2022-05-05 DIAGNOSIS — Z8616 Personal history of COVID-19: Secondary | ICD-10-CM | POA: Diagnosis not present

## 2022-05-05 DIAGNOSIS — I2510 Atherosclerotic heart disease of native coronary artery without angina pectoris: Secondary | ICD-10-CM | POA: Diagnosis not present

## 2022-05-07 DIAGNOSIS — I2510 Atherosclerotic heart disease of native coronary artery without angina pectoris: Secondary | ICD-10-CM | POA: Diagnosis not present

## 2022-05-07 DIAGNOSIS — F02811 Dementia in other diseases classified elsewhere, unspecified severity, with agitation: Secondary | ICD-10-CM | POA: Diagnosis not present

## 2022-05-07 DIAGNOSIS — Z8616 Personal history of COVID-19: Secondary | ICD-10-CM | POA: Diagnosis not present

## 2022-05-07 DIAGNOSIS — R296 Repeated falls: Secondary | ICD-10-CM | POA: Diagnosis not present

## 2022-05-07 DIAGNOSIS — G309 Alzheimer's disease, unspecified: Secondary | ICD-10-CM | POA: Diagnosis not present

## 2022-05-07 DIAGNOSIS — I252 Old myocardial infarction: Secondary | ICD-10-CM | POA: Diagnosis not present

## 2022-05-10 DIAGNOSIS — Z8616 Personal history of COVID-19: Secondary | ICD-10-CM | POA: Diagnosis not present

## 2022-05-10 DIAGNOSIS — G309 Alzheimer's disease, unspecified: Secondary | ICD-10-CM | POA: Diagnosis not present

## 2022-05-10 DIAGNOSIS — R296 Repeated falls: Secondary | ICD-10-CM | POA: Diagnosis not present

## 2022-05-10 DIAGNOSIS — I2510 Atherosclerotic heart disease of native coronary artery without angina pectoris: Secondary | ICD-10-CM | POA: Diagnosis not present

## 2022-05-10 DIAGNOSIS — F02811 Dementia in other diseases classified elsewhere, unspecified severity, with agitation: Secondary | ICD-10-CM | POA: Diagnosis not present

## 2022-05-10 DIAGNOSIS — I252 Old myocardial infarction: Secondary | ICD-10-CM | POA: Diagnosis not present

## 2022-05-12 DIAGNOSIS — I2510 Atherosclerotic heart disease of native coronary artery without angina pectoris: Secondary | ICD-10-CM | POA: Diagnosis not present

## 2022-05-12 DIAGNOSIS — R296 Repeated falls: Secondary | ICD-10-CM | POA: Diagnosis not present

## 2022-05-12 DIAGNOSIS — I252 Old myocardial infarction: Secondary | ICD-10-CM | POA: Diagnosis not present

## 2022-05-12 DIAGNOSIS — Z8616 Personal history of COVID-19: Secondary | ICD-10-CM | POA: Diagnosis not present

## 2022-05-12 DIAGNOSIS — G309 Alzheimer's disease, unspecified: Secondary | ICD-10-CM | POA: Diagnosis not present

## 2022-05-12 DIAGNOSIS — F02811 Dementia in other diseases classified elsewhere, unspecified severity, with agitation: Secondary | ICD-10-CM | POA: Diagnosis not present

## 2022-05-13 DIAGNOSIS — I252 Old myocardial infarction: Secondary | ICD-10-CM | POA: Diagnosis not present

## 2022-05-13 DIAGNOSIS — Z8616 Personal history of COVID-19: Secondary | ICD-10-CM | POA: Diagnosis not present

## 2022-05-13 DIAGNOSIS — I2510 Atherosclerotic heart disease of native coronary artery without angina pectoris: Secondary | ICD-10-CM | POA: Diagnosis not present

## 2022-05-13 DIAGNOSIS — F02811 Dementia in other diseases classified elsewhere, unspecified severity, with agitation: Secondary | ICD-10-CM | POA: Diagnosis not present

## 2022-05-13 DIAGNOSIS — G309 Alzheimer's disease, unspecified: Secondary | ICD-10-CM | POA: Diagnosis not present

## 2022-05-13 DIAGNOSIS — R296 Repeated falls: Secondary | ICD-10-CM | POA: Diagnosis not present

## 2022-05-17 DIAGNOSIS — R296 Repeated falls: Secondary | ICD-10-CM | POA: Diagnosis not present

## 2022-05-17 DIAGNOSIS — F02811 Dementia in other diseases classified elsewhere, unspecified severity, with agitation: Secondary | ICD-10-CM | POA: Diagnosis not present

## 2022-05-17 DIAGNOSIS — I252 Old myocardial infarction: Secondary | ICD-10-CM | POA: Diagnosis not present

## 2022-05-17 DIAGNOSIS — G309 Alzheimer's disease, unspecified: Secondary | ICD-10-CM | POA: Diagnosis not present

## 2022-05-17 DIAGNOSIS — I2510 Atherosclerotic heart disease of native coronary artery without angina pectoris: Secondary | ICD-10-CM | POA: Diagnosis not present

## 2022-05-17 DIAGNOSIS — Z8616 Personal history of COVID-19: Secondary | ICD-10-CM | POA: Diagnosis not present

## 2022-05-19 DIAGNOSIS — G309 Alzheimer's disease, unspecified: Secondary | ICD-10-CM | POA: Diagnosis not present

## 2022-05-19 DIAGNOSIS — Z8616 Personal history of COVID-19: Secondary | ICD-10-CM | POA: Diagnosis not present

## 2022-05-19 DIAGNOSIS — I2510 Atherosclerotic heart disease of native coronary artery without angina pectoris: Secondary | ICD-10-CM | POA: Diagnosis not present

## 2022-05-19 DIAGNOSIS — F02811 Dementia in other diseases classified elsewhere, unspecified severity, with agitation: Secondary | ICD-10-CM | POA: Diagnosis not present

## 2022-05-19 DIAGNOSIS — R296 Repeated falls: Secondary | ICD-10-CM | POA: Diagnosis not present

## 2022-05-19 DIAGNOSIS — I252 Old myocardial infarction: Secondary | ICD-10-CM | POA: Diagnosis not present

## 2022-05-20 DIAGNOSIS — R296 Repeated falls: Secondary | ICD-10-CM | POA: Diagnosis not present

## 2022-05-20 DIAGNOSIS — G309 Alzheimer's disease, unspecified: Secondary | ICD-10-CM | POA: Diagnosis not present

## 2022-05-20 DIAGNOSIS — I252 Old myocardial infarction: Secondary | ICD-10-CM | POA: Diagnosis not present

## 2022-05-20 DIAGNOSIS — F02811 Dementia in other diseases classified elsewhere, unspecified severity, with agitation: Secondary | ICD-10-CM | POA: Diagnosis not present

## 2022-05-20 DIAGNOSIS — Z8616 Personal history of COVID-19: Secondary | ICD-10-CM | POA: Diagnosis not present

## 2022-05-20 DIAGNOSIS — I2510 Atherosclerotic heart disease of native coronary artery without angina pectoris: Secondary | ICD-10-CM | POA: Diagnosis not present

## 2022-05-21 DIAGNOSIS — G309 Alzheimer's disease, unspecified: Secondary | ICD-10-CM | POA: Diagnosis not present

## 2022-05-21 DIAGNOSIS — I252 Old myocardial infarction: Secondary | ICD-10-CM | POA: Diagnosis not present

## 2022-05-21 DIAGNOSIS — R296 Repeated falls: Secondary | ICD-10-CM | POA: Diagnosis not present

## 2022-05-21 DIAGNOSIS — Z8616 Personal history of COVID-19: Secondary | ICD-10-CM | POA: Diagnosis not present

## 2022-05-21 DIAGNOSIS — F02811 Dementia in other diseases classified elsewhere, unspecified severity, with agitation: Secondary | ICD-10-CM | POA: Diagnosis not present

## 2022-05-21 DIAGNOSIS — I2510 Atherosclerotic heart disease of native coronary artery without angina pectoris: Secondary | ICD-10-CM | POA: Diagnosis not present

## 2022-05-22 DIAGNOSIS — G309 Alzheimer's disease, unspecified: Secondary | ICD-10-CM | POA: Diagnosis not present

## 2022-05-22 DIAGNOSIS — R296 Repeated falls: Secondary | ICD-10-CM | POA: Diagnosis not present

## 2022-05-22 DIAGNOSIS — F02811 Dementia in other diseases classified elsewhere, unspecified severity, with agitation: Secondary | ICD-10-CM | POA: Diagnosis not present

## 2022-05-22 DIAGNOSIS — I2510 Atherosclerotic heart disease of native coronary artery without angina pectoris: Secondary | ICD-10-CM | POA: Diagnosis not present

## 2022-05-22 DIAGNOSIS — I252 Old myocardial infarction: Secondary | ICD-10-CM | POA: Diagnosis not present

## 2022-05-22 DIAGNOSIS — Z8616 Personal history of COVID-19: Secondary | ICD-10-CM | POA: Diagnosis not present

## 2022-05-23 DIAGNOSIS — G309 Alzheimer's disease, unspecified: Secondary | ICD-10-CM | POA: Diagnosis not present

## 2022-05-23 DIAGNOSIS — R296 Repeated falls: Secondary | ICD-10-CM | POA: Diagnosis not present

## 2022-05-23 DIAGNOSIS — F02811 Dementia in other diseases classified elsewhere, unspecified severity, with agitation: Secondary | ICD-10-CM | POA: Diagnosis not present

## 2022-05-23 DIAGNOSIS — I252 Old myocardial infarction: Secondary | ICD-10-CM | POA: Diagnosis not present

## 2022-05-23 DIAGNOSIS — I2510 Atherosclerotic heart disease of native coronary artery without angina pectoris: Secondary | ICD-10-CM | POA: Diagnosis not present

## 2022-05-23 DIAGNOSIS — Z8616 Personal history of COVID-19: Secondary | ICD-10-CM | POA: Diagnosis not present

## 2022-05-24 DIAGNOSIS — I2510 Atherosclerotic heart disease of native coronary artery without angina pectoris: Secondary | ICD-10-CM | POA: Diagnosis not present

## 2022-05-24 DIAGNOSIS — G309 Alzheimer's disease, unspecified: Secondary | ICD-10-CM | POA: Diagnosis not present

## 2022-05-24 DIAGNOSIS — Z8616 Personal history of COVID-19: Secondary | ICD-10-CM | POA: Diagnosis not present

## 2022-05-24 DIAGNOSIS — R296 Repeated falls: Secondary | ICD-10-CM | POA: Diagnosis not present

## 2022-05-24 DIAGNOSIS — I252 Old myocardial infarction: Secondary | ICD-10-CM | POA: Diagnosis not present

## 2022-05-24 DIAGNOSIS — F02811 Dementia in other diseases classified elsewhere, unspecified severity, with agitation: Secondary | ICD-10-CM | POA: Diagnosis not present

## 2022-05-25 DIAGNOSIS — I2510 Atherosclerotic heart disease of native coronary artery without angina pectoris: Secondary | ICD-10-CM | POA: Diagnosis not present

## 2022-05-25 DIAGNOSIS — Z8616 Personal history of COVID-19: Secondary | ICD-10-CM | POA: Diagnosis not present

## 2022-05-25 DIAGNOSIS — F02811 Dementia in other diseases classified elsewhere, unspecified severity, with agitation: Secondary | ICD-10-CM | POA: Diagnosis not present

## 2022-05-25 DIAGNOSIS — R296 Repeated falls: Secondary | ICD-10-CM | POA: Diagnosis not present

## 2022-05-25 DIAGNOSIS — I252 Old myocardial infarction: Secondary | ICD-10-CM | POA: Diagnosis not present

## 2022-05-25 DIAGNOSIS — G309 Alzheimer's disease, unspecified: Secondary | ICD-10-CM | POA: Diagnosis not present

## 2022-06-02 DEATH — deceased
